# Patient Record
Sex: Female | Born: 1960
Health system: Southern US, Community
[De-identification: ages and names within clinical notes are randomized; demographics above are authoritative.]

## PROBLEM LIST (undated history)

## (undated) DIAGNOSIS — E669 Obesity, unspecified: Secondary | ICD-10-CM

## (undated) DIAGNOSIS — J329 Chronic sinusitis, unspecified: Secondary | ICD-10-CM

## (undated) DIAGNOSIS — K219 Gastro-esophageal reflux disease without esophagitis: Secondary | ICD-10-CM

## (undated) DIAGNOSIS — I25810 Atherosclerosis of coronary artery bypass graft(s) without angina pectoris: Secondary | ICD-10-CM

## (undated) DIAGNOSIS — H269 Unspecified cataract: Secondary | ICD-10-CM

## (undated) DIAGNOSIS — H409 Unspecified glaucoma: Secondary | ICD-10-CM

## (undated) DIAGNOSIS — T7840XA Allergy, unspecified, initial encounter: Secondary | ICD-10-CM

## (undated) DIAGNOSIS — Z9289 Personal history of other medical treatment: Secondary | ICD-10-CM

## (undated) DIAGNOSIS — I10 Essential (primary) hypertension: Secondary | ICD-10-CM

## (undated) DIAGNOSIS — J45909 Unspecified asthma, uncomplicated: Secondary | ICD-10-CM

## (undated) DIAGNOSIS — I480 Paroxysmal atrial fibrillation: Secondary | ICD-10-CM

## (undated) DIAGNOSIS — F419 Anxiety disorder, unspecified: Secondary | ICD-10-CM

## (undated) HISTORY — DX: Anxiety disorder, unspecified: F41.9

## (undated) HISTORY — DX: Obesity, unspecified: E66.9

## (undated) HISTORY — DX: Atherosclerosis of coronary artery bypass graft(s) without angina pectoris: I25.810

## (undated) HISTORY — DX: Unspecified glaucoma: H40.9

## (undated) HISTORY — DX: Unspecified asthma, uncomplicated: J45.909

## (undated) HISTORY — DX: Essential (primary) hypertension: I10

## (undated) HISTORY — DX: Gastro-esophageal reflux disease without esophagitis: K21.9

## (undated) HISTORY — PX: COLONOSCOPY: SHX174

## (undated) HISTORY — DX: Unspecified cataract: H26.9

## (undated) HISTORY — PX: OTHER SURGICAL HISTORY: SHX169

## (undated) HISTORY — DX: Allergy, unspecified, initial encounter: T78.40XA

## (undated) HISTORY — DX: Chronic sinusitis, unspecified: J32.9

---

## 1987-11-09 HISTORY — PX: OTHER SURGICAL HISTORY: SHX169

## 1999-04-23 ENCOUNTER — Other Ambulatory Visit: Admission: RE | Admit: 1999-04-23 | Discharge: 1999-04-23 | Payer: Self-pay | Admitting: Obstetrics and Gynecology

## 1999-11-09 HISTORY — PX: CERVICAL DISCECTOMY: SHX98

## 2000-09-06 ENCOUNTER — Other Ambulatory Visit: Admission: RE | Admit: 2000-09-06 | Discharge: 2000-09-06 | Payer: Self-pay | Admitting: Obstetrics and Gynecology

## 2000-10-03 ENCOUNTER — Ambulatory Visit (HOSPITAL_COMMUNITY): Admission: RE | Admit: 2000-10-03 | Discharge: 2000-10-04 | Payer: Self-pay | Admitting: Neurosurgery

## 2000-10-03 ENCOUNTER — Encounter: Payer: Self-pay | Admitting: Neurosurgery

## 2000-11-29 ENCOUNTER — Ambulatory Visit (HOSPITAL_COMMUNITY): Admission: RE | Admit: 2000-11-29 | Discharge: 2000-11-29 | Payer: Self-pay | Admitting: Neurosurgery

## 2000-11-29 ENCOUNTER — Encounter: Payer: Self-pay | Admitting: Neurosurgery

## 2001-08-23 ENCOUNTER — Encounter: Payer: Self-pay | Admitting: Neurosurgery

## 2001-08-23 ENCOUNTER — Ambulatory Visit (HOSPITAL_COMMUNITY): Admission: RE | Admit: 2001-08-23 | Discharge: 2001-08-23 | Payer: Self-pay | Admitting: Neurosurgery

## 2005-05-08 ENCOUNTER — Encounter: Payer: Self-pay | Admitting: Family Medicine

## 2005-05-08 LAB — CONVERTED CEMR LAB: Pap Smear: NORMAL

## 2005-09-21 ENCOUNTER — Ambulatory Visit: Payer: Self-pay | Admitting: Family Medicine

## 2005-10-13 ENCOUNTER — Ambulatory Visit: Payer: Self-pay | Admitting: Family Medicine

## 2005-10-22 ENCOUNTER — Ambulatory Visit: Payer: Self-pay | Admitting: Family Medicine

## 2005-11-23 ENCOUNTER — Ambulatory Visit: Payer: Self-pay | Admitting: Family Medicine

## 2005-12-01 ENCOUNTER — Ambulatory Visit: Payer: Self-pay | Admitting: Gastroenterology

## 2005-12-15 ENCOUNTER — Ambulatory Visit: Payer: Self-pay | Admitting: Family Medicine

## 2006-01-03 ENCOUNTER — Ambulatory Visit: Payer: Self-pay | Admitting: Gastroenterology

## 2006-01-04 ENCOUNTER — Ambulatory Visit: Payer: Self-pay | Admitting: Family Medicine

## 2006-01-04 LAB — HM COLONOSCOPY: HM Colonoscopy: NORMAL

## 2006-04-08 ENCOUNTER — Ambulatory Visit: Payer: Self-pay | Admitting: Family Medicine

## 2006-04-13 ENCOUNTER — Other Ambulatory Visit: Admission: RE | Admit: 2006-04-13 | Discharge: 2006-04-13 | Payer: Self-pay | Admitting: Family Medicine

## 2006-04-13 ENCOUNTER — Ambulatory Visit: Payer: Self-pay | Admitting: Family Medicine

## 2006-04-13 ENCOUNTER — Encounter: Payer: Self-pay | Admitting: Family Medicine

## 2006-08-30 ENCOUNTER — Ambulatory Visit: Payer: Self-pay | Admitting: Family Medicine

## 2006-09-27 ENCOUNTER — Ambulatory Visit: Payer: Self-pay | Admitting: Family Medicine

## 2007-01-18 ENCOUNTER — Ambulatory Visit: Payer: Self-pay | Admitting: Family Medicine

## 2007-02-07 HISTORY — PX: OTHER SURGICAL HISTORY: SHX169

## 2007-02-15 ENCOUNTER — Ambulatory Visit: Payer: Self-pay | Admitting: Family Medicine

## 2007-02-20 ENCOUNTER — Ambulatory Visit: Payer: Self-pay | Admitting: Family Medicine

## 2007-02-28 ENCOUNTER — Ambulatory Visit: Payer: Self-pay

## 2007-03-09 ENCOUNTER — Encounter: Payer: Self-pay | Admitting: Family Medicine

## 2007-03-09 DIAGNOSIS — F418 Other specified anxiety disorders: Secondary | ICD-10-CM | POA: Insufficient documentation

## 2007-03-09 DIAGNOSIS — I10 Essential (primary) hypertension: Secondary | ICD-10-CM | POA: Insufficient documentation

## 2007-03-09 DIAGNOSIS — J309 Allergic rhinitis, unspecified: Secondary | ICD-10-CM | POA: Insufficient documentation

## 2007-03-21 ENCOUNTER — Ambulatory Visit: Payer: Self-pay | Admitting: Family Medicine

## 2007-04-20 ENCOUNTER — Encounter: Payer: Self-pay | Admitting: Family Medicine

## 2007-04-24 ENCOUNTER — Ambulatory Visit: Payer: Self-pay | Admitting: Family Medicine

## 2007-04-27 ENCOUNTER — Telehealth: Payer: Self-pay | Admitting: Family Medicine

## 2007-05-18 ENCOUNTER — Telehealth (INDEPENDENT_AMBULATORY_CARE_PROVIDER_SITE_OTHER): Payer: Self-pay | Admitting: *Deleted

## 2007-05-19 ENCOUNTER — Ambulatory Visit: Payer: Self-pay | Admitting: Family Medicine

## 2007-05-19 LAB — CONVERTED CEMR LAB
Bilirubin Urine: NEGATIVE
Blood in Urine, dipstick: NEGATIVE
Glucose, Urine, Semiquant: NEGATIVE
Ketones, urine, test strip: NEGATIVE
Nitrite: NEGATIVE
Specific Gravity, Urine: <1.005
Urobilinogen, UA: 0.2
pH: 7.5

## 2007-07-18 ENCOUNTER — Ambulatory Visit: Payer: Self-pay | Admitting: Family Medicine

## 2007-07-18 LAB — CONVERTED CEMR LAB
ALT: 22 units/L (ref 0–35)
AST: 26 units/L (ref 0–37)
Albumin: 3.7 g/dL (ref 3.5–5.2)
Alkaline Phosphatase: 46 units/L (ref 39–117)
BUN: 12 mg/dL (ref 6–23)
Bilirubin, Direct: 0.1 mg/dL (ref 0.0–0.3)
CO2: 28 meq/L (ref 19–32)
Calcium: 9.2 mg/dL (ref 8.4–10.5)
Chloride: 105 meq/L (ref 96–112)
Cholesterol: 194 mg/dL (ref 0–200)
Creatinine, Ser: 0.7 mg/dL (ref 0.4–1.2)
GFR calc Af Amer: 116 mL/min
GFR calc non Af Amer: 96 mL/min
Glucose, Bld: 96 mg/dL (ref 70–99)
HDL: 77 mg/dL (ref >39.0)
LDL Cholesterol: 109 mg/dL — ABNORMAL HIGH (ref 0–99)
Potassium: 4.2 meq/L (ref 3.5–5.1)
Sodium: 137 meq/L (ref 135–145)
TSH: 2.03 microintl units/mL (ref 0.35–5.50)
Total Bilirubin: 0.9 mg/dL (ref 0.3–1.2)
Total CHOL/HDL Ratio: 2.5
Total Protein: 6.6 g/dL (ref 6.0–8.3)
Triglycerides: 39 mg/dL (ref 0–149)
VLDL: 8 mg/dL (ref 0–40)

## 2007-07-24 ENCOUNTER — Other Ambulatory Visit: Admission: RE | Admit: 2007-07-24 | Discharge: 2007-07-24 | Payer: Self-pay | Admitting: Family Medicine

## 2007-07-24 ENCOUNTER — Encounter: Payer: Self-pay | Admitting: Family Medicine

## 2007-07-24 ENCOUNTER — Ambulatory Visit: Payer: Self-pay | Admitting: Family Medicine

## 2007-07-27 ENCOUNTER — Encounter (INDEPENDENT_AMBULATORY_CARE_PROVIDER_SITE_OTHER): Payer: Self-pay | Admitting: *Deleted

## 2007-08-01 ENCOUNTER — Encounter: Payer: Self-pay | Admitting: Family Medicine

## 2007-09-04 ENCOUNTER — Ambulatory Visit: Payer: Self-pay | Admitting: Family Medicine

## 2007-09-05 ENCOUNTER — Encounter: Payer: Self-pay | Admitting: Family Medicine

## 2007-09-07 ENCOUNTER — Encounter (INDEPENDENT_AMBULATORY_CARE_PROVIDER_SITE_OTHER): Payer: Self-pay | Admitting: *Deleted

## 2007-09-29 ENCOUNTER — Telehealth (INDEPENDENT_AMBULATORY_CARE_PROVIDER_SITE_OTHER): Payer: Self-pay | Admitting: Internal Medicine

## 2007-10-02 ENCOUNTER — Ambulatory Visit: Payer: Self-pay | Admitting: Family Medicine

## 2007-11-09 HISTORY — PX: BUNIONECTOMY: SHX129

## 2007-11-29 ENCOUNTER — Ambulatory Visit: Payer: Self-pay | Admitting: Family Medicine

## 2007-11-29 LAB — CONVERTED CEMR LAB
BUN: 8 mg/dL (ref 6–23)
CO2: 26 meq/L (ref 19–32)
Calcium: 9.9 mg/dL (ref 8.4–10.5)
Chloride: 104 meq/L (ref 96–112)
Creatinine, Ser: 0.8 mg/dL (ref 0.4–1.2)
GFR calc Af Amer: 99 mL/min
GFR calc non Af Amer: 82 mL/min
Glucose, Bld: 102 mg/dL — ABNORMAL HIGH (ref 70–99)
Potassium: 3.9 meq/L (ref 3.5–5.1)
Sodium: 138 meq/L (ref 135–145)

## 2007-12-04 ENCOUNTER — Ambulatory Visit: Payer: Self-pay | Admitting: Family Medicine

## 2008-01-11 ENCOUNTER — Ambulatory Visit: Payer: Self-pay | Admitting: Family Medicine

## 2008-01-12 ENCOUNTER — Encounter: Payer: Self-pay | Admitting: Family Medicine

## 2008-02-13 ENCOUNTER — Telehealth: Payer: Self-pay | Admitting: Family Medicine

## 2008-03-04 ENCOUNTER — Ambulatory Visit: Payer: Self-pay | Admitting: Family Medicine

## 2008-03-27 ENCOUNTER — Encounter: Payer: Self-pay | Admitting: Family Medicine

## 2008-03-27 HISTORY — PX: ENDOMETRIAL BIOPSY: SHX622

## 2008-03-28 ENCOUNTER — Encounter: Payer: Self-pay | Admitting: Family Medicine

## 2008-04-09 ENCOUNTER — Encounter: Payer: Self-pay | Admitting: Family Medicine

## 2008-04-09 HISTORY — PX: US TRANSVAGINAL PELVIC MODIFIED: HXRAD721

## 2008-04-11 ENCOUNTER — Encounter: Payer: Self-pay | Admitting: Family Medicine

## 2008-08-01 ENCOUNTER — Ambulatory Visit: Payer: Self-pay | Admitting: Family Medicine

## 2008-08-01 LAB — CONVERTED CEMR LAB
ALT: 26 units/L (ref 0–35)
Albumin: 4.3 g/dL (ref 3.5–5.2)
BUN: 11 mg/dL (ref 6–23)
Basophils Relative: 0.3 % (ref 0.0–3.0)
CO2: 27 meq/L (ref 19–32)
Calcium: 9.6 mg/dL (ref 8.4–10.5)
Creatinine, Ser: 0.8 mg/dL (ref 0.4–1.2)
Eosinophils Relative: 4.1 % (ref 0.0–5.0)
GFR calc Af Amer: 99 mL/min
Glucose, Bld: 90 mg/dL (ref 70–99)
HCT: 39 % (ref 36.0–46.0)
Hemoglobin: 13.4 g/dL (ref 12.0–15.0)
Monocytes Absolute: 0.4 10*3/uL (ref 0.1–1.0)
Monocytes Relative: 9.5 % (ref 3.0–12.0)
Neutro Abs: 2.7 10*3/uL (ref 1.4–7.7)
RBC: 3.98 M/uL (ref 3.87–5.11)
RDW: 11.8 % (ref 11.5–14.6)
Total CHOL/HDL Ratio: 2.3
Total Protein: 7 g/dL (ref 6.0–8.3)
Triglycerides: 35 mg/dL (ref 0–149)
WBC: 4.3 10*3/uL — ABNORMAL LOW (ref 4.5–10.5)

## 2008-08-07 ENCOUNTER — Encounter: Payer: Self-pay | Admitting: Family Medicine

## 2008-08-07 ENCOUNTER — Other Ambulatory Visit: Admission: RE | Admit: 2008-08-07 | Discharge: 2008-08-07 | Payer: Self-pay | Admitting: Family Medicine

## 2008-08-07 ENCOUNTER — Ambulatory Visit: Payer: Self-pay | Admitting: Family Medicine

## 2008-08-12 ENCOUNTER — Encounter (INDEPENDENT_AMBULATORY_CARE_PROVIDER_SITE_OTHER): Payer: Self-pay | Admitting: *Deleted

## 2008-08-27 ENCOUNTER — Encounter: Payer: Self-pay | Admitting: Family Medicine

## 2008-10-28 ENCOUNTER — Ambulatory Visit: Payer: Self-pay | Admitting: Family Medicine

## 2008-10-28 LAB — CONVERTED CEMR LAB
Calcium: 9.2 mg/dL (ref 8.4–10.5)
GFR calc Af Amer: 115 mL/min
GFR calc non Af Amer: 95 mL/min
Potassium: 3.9 meq/L (ref 3.5–5.1)
Sodium: 137 meq/L (ref 135–145)

## 2008-11-04 ENCOUNTER — Ambulatory Visit: Payer: Self-pay | Admitting: Family Medicine

## 2009-02-03 ENCOUNTER — Ambulatory Visit: Payer: Self-pay | Admitting: Family Medicine

## 2009-02-06 ENCOUNTER — Telehealth: Payer: Self-pay | Admitting: Family Medicine

## 2009-03-17 ENCOUNTER — Telehealth: Payer: Self-pay | Admitting: Family Medicine

## 2009-03-18 ENCOUNTER — Telehealth: Payer: Self-pay | Admitting: Family Medicine

## 2009-03-19 ENCOUNTER — Telehealth: Payer: Self-pay | Admitting: Family Medicine

## 2009-04-11 ENCOUNTER — Telehealth: Payer: Self-pay | Admitting: Family Medicine

## 2009-05-06 ENCOUNTER — Ambulatory Visit: Payer: Self-pay | Admitting: Family Medicine

## 2009-06-17 ENCOUNTER — Telehealth: Payer: Self-pay | Admitting: Family Medicine

## 2009-07-02 ENCOUNTER — Ambulatory Visit: Payer: Self-pay | Admitting: Family Medicine

## 2009-07-02 LAB — CONVERTED CEMR LAB
BUN: 8 mg/dL (ref 6–23)
Chloride: 100 meq/L (ref 96–112)
Creatinine, Ser: 0.8 mg/dL (ref 0.4–1.2)
GFR calc non Af Amer: 81.1 mL/min (ref >60)
Potassium: 4.5 meq/L (ref 3.5–5.1)

## 2009-07-07 ENCOUNTER — Ambulatory Visit: Payer: Self-pay | Admitting: Family Medicine

## 2009-08-11 ENCOUNTER — Ambulatory Visit: Payer: Self-pay | Admitting: Family Medicine

## 2009-08-11 LAB — CONVERTED CEMR LAB
AST: 32 units/L (ref 0–37)
Albumin: 4.6 g/dL (ref 3.5–5.2)
Alkaline Phosphatase: 55 units/L (ref 39–117)
Bilirubin, Direct: 0 mg/dL (ref 0.0–0.3)
CO2: 29 meq/L (ref 19–32)
Calcium: 9.6 mg/dL (ref 8.4–10.5)
GFR calc non Af Amer: 81.06 mL/min (ref >60)
Glucose, Bld: 84 mg/dL (ref 70–99)
HDL: 80.7 mg/dL (ref >39.00)
Potassium: 4.3 meq/L (ref 3.5–5.1)
Sodium: 136 meq/L (ref 135–145)
TSH: 1.72 microintl units/mL (ref 0.35–5.50)
Total CHOL/HDL Ratio: 2
VLDL: 8.6 mg/dL (ref 0.0–40.0)

## 2009-08-14 ENCOUNTER — Encounter: Payer: Self-pay | Admitting: Family Medicine

## 2009-08-14 ENCOUNTER — Ambulatory Visit: Payer: Self-pay | Admitting: Family Medicine

## 2009-08-14 ENCOUNTER — Other Ambulatory Visit: Admission: RE | Admit: 2009-08-14 | Discharge: 2009-08-14 | Payer: Self-pay | Admitting: Family Medicine

## 2009-08-14 DIAGNOSIS — K219 Gastro-esophageal reflux disease without esophagitis: Secondary | ICD-10-CM | POA: Insufficient documentation

## 2009-08-14 LAB — CONVERTED CEMR LAB: Pap Smear: NORMAL

## 2009-08-19 ENCOUNTER — Encounter (INDEPENDENT_AMBULATORY_CARE_PROVIDER_SITE_OTHER): Payer: Self-pay | Admitting: *Deleted

## 2009-08-25 ENCOUNTER — Telehealth: Payer: Self-pay | Admitting: Family Medicine

## 2009-09-01 ENCOUNTER — Encounter: Payer: Self-pay | Admitting: Family Medicine

## 2009-11-08 DIAGNOSIS — J329 Chronic sinusitis, unspecified: Secondary | ICD-10-CM

## 2009-11-08 HISTORY — DX: Chronic sinusitis, unspecified: J32.9

## 2009-11-26 ENCOUNTER — Ambulatory Visit: Payer: Self-pay | Admitting: Family Medicine

## 2009-11-28 ENCOUNTER — Telehealth: Payer: Self-pay | Admitting: Family Medicine

## 2009-12-10 ENCOUNTER — Telehealth: Payer: Self-pay | Admitting: Family Medicine

## 2009-12-24 ENCOUNTER — Ambulatory Visit: Payer: Self-pay | Admitting: Family Medicine

## 2010-01-01 ENCOUNTER — Telehealth: Payer: Self-pay | Admitting: Family Medicine

## 2010-01-02 ENCOUNTER — Encounter: Payer: Self-pay | Admitting: Family Medicine

## 2010-03-04 ENCOUNTER — Ambulatory Visit: Payer: Self-pay | Admitting: Family Medicine

## 2010-03-12 ENCOUNTER — Telehealth: Payer: Self-pay | Admitting: Family Medicine

## 2010-03-12 ENCOUNTER — Ambulatory Visit: Payer: Self-pay | Admitting: Family Medicine

## 2010-03-17 ENCOUNTER — Telehealth: Payer: Self-pay | Admitting: Family Medicine

## 2010-03-23 ENCOUNTER — Encounter: Payer: Self-pay | Admitting: Family Medicine

## 2010-04-14 ENCOUNTER — Encounter: Payer: Self-pay | Admitting: Family Medicine

## 2010-05-05 ENCOUNTER — Telehealth: Payer: Self-pay | Admitting: Family Medicine

## 2010-05-13 ENCOUNTER — Encounter: Payer: Self-pay | Admitting: Family Medicine

## 2010-05-24 ENCOUNTER — Encounter: Payer: Self-pay | Admitting: Family Medicine

## 2010-06-04 ENCOUNTER — Ambulatory Visit: Payer: Self-pay | Admitting: Unknown Physician Specialty

## 2010-06-08 ENCOUNTER — Ambulatory Visit: Payer: Self-pay | Admitting: Unknown Physician Specialty

## 2010-06-10 ENCOUNTER — Encounter: Payer: Self-pay | Admitting: Family Medicine

## 2010-06-16 ENCOUNTER — Encounter (INDEPENDENT_AMBULATORY_CARE_PROVIDER_SITE_OTHER): Payer: Self-pay | Admitting: *Deleted

## 2010-08-21 ENCOUNTER — Ambulatory Visit: Payer: Self-pay | Admitting: Unknown Physician Specialty

## 2010-09-10 ENCOUNTER — Telehealth: Payer: Self-pay | Admitting: Family Medicine

## 2010-09-30 ENCOUNTER — Ambulatory Visit: Payer: Self-pay | Admitting: Cardiology

## 2010-09-30 ENCOUNTER — Encounter (INDEPENDENT_AMBULATORY_CARE_PROVIDER_SITE_OTHER): Payer: Self-pay | Admitting: Emergency Medicine

## 2010-09-30 ENCOUNTER — Observation Stay (HOSPITAL_COMMUNITY): Admission: EM | Admit: 2010-09-30 | Discharge: 2010-09-30 | Payer: Self-pay | Admitting: Emergency Medicine

## 2010-10-07 ENCOUNTER — Ambulatory Visit: Payer: Self-pay | Admitting: Family Medicine

## 2010-10-07 DIAGNOSIS — R05 Cough: Secondary | ICD-10-CM

## 2010-10-07 DIAGNOSIS — R059 Cough, unspecified: Secondary | ICD-10-CM | POA: Insufficient documentation

## 2010-10-13 ENCOUNTER — Encounter: Payer: Self-pay | Admitting: Family Medicine

## 2010-10-15 ENCOUNTER — Ambulatory Visit: Payer: Self-pay | Admitting: Family Medicine

## 2010-10-16 DIAGNOSIS — E7800 Pure hypercholesterolemia, unspecified: Secondary | ICD-10-CM | POA: Insufficient documentation

## 2010-10-16 LAB — CONVERTED CEMR LAB
Cholesterol: 205 mg/dL — ABNORMAL HIGH (ref 0–200)
Direct LDL: 81 mg/dL

## 2010-10-20 ENCOUNTER — Ambulatory Visit: Payer: Self-pay | Admitting: Family Medicine

## 2010-10-20 LAB — CONVERTED CEMR LAB

## 2010-10-20 LAB — HM PAP SMEAR

## 2010-11-19 ENCOUNTER — Encounter: Payer: Self-pay | Admitting: Family Medicine

## 2010-11-19 ENCOUNTER — Ambulatory Visit: Payer: Self-pay | Admitting: Family Medicine

## 2010-11-23 ENCOUNTER — Ambulatory Visit
Admission: RE | Admit: 2010-11-23 | Discharge: 2010-11-23 | Payer: Self-pay | Source: Home / Self Care | Attending: Family Medicine | Admitting: Family Medicine

## 2010-12-10 NOTE — Progress Notes (Signed)
Summary: need cough med  Phone Note Call from Patient Call back at [phone]   Caller: Patient Call For: Dr Milinda Antis Summary of Call: Pt saw Dr. Hetty Ely on 11/26/09 and had productive cough and Dr. Hetty Ely told pt to get mucus relief expectorant. Pt states that is not helping the cough and would like cough med called [facility]. [phone]. Please advise.  Initial call taken by: Lewanda Rife LPN,  November 28, 2009 2:44 PM  Follow-up for Phone Call        if fever or worse cough- f/u px written on EMR for call in - codiene cough syrup -- use caution for sedation Follow-up by: Judith Part MD,  November 28, 2009 4:36 PM  Additional Follow-up for Phone Call Additional follow up Details #1::        Med called to [facility], advised pt. Additional Follow-up by: Lowella Petties CMA,  November 28, 2009 4:44 PM    New/Updated Medications: GUAIFENESIN-CODEINE 100-10 MG/5ML SYRP (GUAIFENESIN-CODEINE) 1-2 tsp by mouth up to every 4-6 hours as needed severe cough Prescriptions: GUAIFENESIN-CODEINE 100-10 MG/5ML SYRP (GUAIFENESIN-CODEINE) 1-2 tsp by mouth up to every 4-6 hours as needed severe cough  #120cc x 0   Entered and Authorized by:   Judith Part MD   Signed by:   Lowella Petties CMA on 11/28/2009   Method used:   [facility] ...       [facility]  [id number]       [address]       [place], [place]  [address]       Ph: [phone]       Fax: [phone]   RxID:   [id number]

## 2010-12-10 NOTE — Letter (Signed)
Summary: [facility]  [facility]   Imported By: Lanelle Bal 05/29/2010 09:39:13  _____________________________________________________________________  External Attachment:    Type:   Image     Comment:   External Document

## 2010-12-10 NOTE — Letter (Signed)
Summary: [facility]  [facility]   Imported By: Maryln Gottron 06/08/2010 15:38:20  _____________________________________________________________________  External Attachment:    Type:   Image     Comment:   External Document  Appended Document: [facility]     Clinical Lists Changes

## 2010-12-10 NOTE — Miscellaneous (Signed)
   Clinical Lists Changes  Observations: Added new observation of PAST MED HX: Allergic rhinitis Anxiety Hypertension Recurrent sinusitis per Dr. Jenne Campus with [facility] 2011 (05/24/2010 17:01)       Past History:  Past Medical History: Allergic rhinitis Anxiety Hypertension Recurrent sinusitis per Dr. Jenne Campus with  ENT 2011

## 2010-12-10 NOTE — Assessment & Plan Note (Signed)
Summary: 51 MIN DR Hetty Ely PT TO ESTABLISH HOSPITAL FOLLOW UP DISCHAR...   Vital Signs:  Patient profile:   50 year old female Height:      65 inches Weight:      175.8 pounds BMI:     29.36 Temp:     98.0 degrees F oral Pulse rate:   76 / minute Pulse rhythm:   regular BP sitting:   130 / 80  (left arm) Cuff size:   regular  Vitals Entered By: Benny Lennert CMA Duncan Dull) (October 07, 2010 10:11 AM)  History of Present Illness: Chief complaint transfer from [facility] and follow hospital in er   ER visit for acute chest pain, shortness of breath and BP elevation (240/120) on 11/23.  CE x 3 and CMET, cbc, CXR all negative.  Early family hisotry of MI in father in 58s.  She is not a  smoker.   Cholesterol ..last checked last year.    Exercise stress ECHO performed...nml EF and no sign of ischemia.Marland Kitchenadequate study on 11/23.  Episodes of BP shooting up every few weeks.. intermittantly in last year. This time it was different..had chest pressure.   Chronic cough for a year... seeing ENT.Marland Kitchenrecent surgery for sinus surgery (found fungal infection) 7 weeks ago... cough resolved for 6 weeks but returned in last 2 weeks.   Per pt has not had laryngoscopy.  Has had some wheezingm and SOB at night, post nasl drip, throat itchy..feels more short of breath with lying back at night. Snores at night, no apnea spells reported by husband. Per pt saw [facility]  [facility]UL[facility].Marland Kitchen PFTs nml per pt (none on record) and CXR nml.  Having heartburn at night  several times a week.. uses TUMs and omeprazole 40 mg daily...  occ emesis. Zegrid helped more in the past. Nexium not helpful.   She reports anxiety well controlled .. using 1/2 tab by mouth daily of Zoloft  On Hyzaar and metoprolol for HTN. She feels like cough started when she changed to generic losartan.  HAs tried Micardia and ACEi in past. When Bp high she feels flushed, palpitations.   Preventive Screening-Counseling &  Management  Caffeine-Diet-Exercise     Does Patient Exercise: no      Drug Use:  no.    Problems Prior to Update: 1)  Pure Hypercholesterolemia  (ICD-272.0) 2)  Gerd  (ICD-530.81) 3)  Other Screening Mammogram  (ICD-V76.12) 4)  Other Abscess of Vulva  (ICD-616.4) 5)  Examination, Routine Medical  (ICD-V70.0) 6)  Hypertension  (ICD-401.9) 7)  Anxiety  (ICD-300.00) 8)  Allergic Rhinitis  (ICD-477.9)  Current Medications (verified): 1)  Zoloft 50 Mg Tabs (Sertraline Hcl) .... Take 1/2   By Mouth Each Am 2)  Calcium 500 Mg Tabs (Calcium) .Marland Kitchen.. 1 Once Daily Per Patient 3)  Claritin 10 Mg  Tabs (Loratadine) .Marland Kitchen.. 1 By Mouth Daily 4)  Hyzaar 100-12.5 Mg Tabs (Losartan Potassium-Hctz) .... One Tab By Mouth Every Am 5)  B-12 250 Mcg  Tabs (Cyanocobalamin) .Marland Kitchen.. 1 Tablet Daily By Mouth 6)  Metoprolol Succinate 100 Mg Xr24h-Tab (Metoprolol Succinate) .... Onetab By Mouth At Night 7)  Pantoprazole Sodium 40 Mg Tbec (Pantoprazole Sodium) .... Take 1 Tablet By Mouth Once A Day 8)  Amlodipine Besylate 10 Mg Tabs (Amlodipine Besylate) .... 1/2 Tab By Mouth Daily X 3 Days Then Increase To 1 Tab By Mouth Daily  Allergies (verified): No Known Drug Allergies  Past History:  Past medical, surgical, family and social histories (including risk factors)  reviewed, and no changes noted (except as noted below).  Past Medical History: Reviewed history from 05/24/2010 and no changes required. Allergic rhinitis Anxiety Hypertension Recurrent sinusitis per Dr. Jenne Campus with [facility] 2011  Past Surgical History: Reviewed history from 08/07/2008 and no changes required. C/S due incr BP  1985 NSVD  VBAC  1989 C5/6 Discectomy  2001 Colonoscopy WNL 01/03/2006    5 years Renal artery U/S [place]ine arteries but no stenosis  04/2202008 R foot surgery Bunion , 2 hammertoes and tendon replacement (Dr Charlsie Merles) 11/2007 Endometrial Bx B9 Endometrium w/ Breakdown changes.(Dr Kinscius)  03/27/2008 Pelvic U/S Nml  04/09/2008  Family History: Reviewed history from 08/14/2009 and no changes required. Father dec 68  CHF HTN Lung Failure(Vent x 4 wks) Colon Ca Ulcerated Pneumonia Mother A 26  Colon CA Partial Colectomy Melanomax2 Brother A 45 HTN Polyps Sister A  52 COPD Polyps Sister A 45 Htn Polyps Sister A 42 Polyps  Social History: Reviewed history from 08/07/2008 and no changes required. Occupation:Branch Occupational hygienist Married  2 children  Never smoker. 2 glasses of wine every day.  Drug use-no Regular exercise-no..used to but unable to do given chronic cough. Does Patient Exercise:  no  Review of Systems General:  Denies fever. CV:  Denies chest pain or discomfort. Resp:  Denies coughing up blood and shortness of breath. GI:  Denies abdominal pain and bloody stools. GU:  Denies dysuria.  Physical Exam  General:  Well-developed,well-nourished,in no acute distress; alert,appropriate and cooperative throughout examination Eyes:  No corneal or conjunctival inflammation noted. EOMI. Perrla. Funduscopic exam benign, without hemorrhages, exudates or papilledema. Vision grossly normal. Ears:  External ear exam shows no significant lesions or deformities.  Otoscopic examination reveals clear canals, tympanic membranes are intact bilaterally without bulging, retraction, inflammation or discharge. Hearing is grossly normal bilaterally. Nose:  External nasal examination shows no deformity or inflammation. Nasal mucosa are pink and moist without lesions or exudates. Mouth:  Oral mucosa and oropharynx without lesions or exudates.  Teeth in good repair. Neck:  no carotid bruit or thyromegaly no cervical or supraclavicular lymphadenopathy  Lungs:  Normal respiratory effort, chest expands symmetrically. Lungs are clear to auscultation, no crackles or wheezes. Heart:  Normal rate and regular rhythm. S1 and S2 normal without gallop, murmur, click, rub or other extra sounds. Abdomen:  Bowel sounds  positive,abdomen soft and non-tender without masses, organomegaly or hernias noted. Pulses:  R and L posterior tibial pulses are full and equal bilaterally  Extremities:  no edmea  Skin:  Intact without suspicious lesions or rashes Psych:  Cognition and judgment appear intact. Alert and cooperative with normal attention span and concentration. No apparent delusions, illusions, hallucinations   Impression & Recommendations:  Problem # 1:  COUGH, CHRONIC (ICD-786.2)  Stop omeprazole .. change to pantoprazole 40 mg daily.   Not resolved with sinus surgery.  [facility] at [facility] .. reviewed and nml.   Less ikely due to allergies.  Problem # 2:  HYPERTENSION (ICD-401.9)  Stop metoprolol.. change to amlodipine 5 mg daily x 3 days then increase to 1 mg daily Follow Bps at home daily.. bring in measurements to next appt.  Her updated medication list for this problem includes:    Hyzaar 100-12.5 Mg Tabs (Losartan potassium-hctz) ..... One tab by mouth every am    Metoprolol Succinate 100 Mg Xr24h-tab (Metoprolol succinate) ..... Onetab by mouth at night    Amlodipine Besylate 10 Mg Tabs (Amlodipine besylate) .Marland Kitchen... 1/2 tab by  mouth daily x 3 days then increase to 1 tab by mouth daily  Orders: T- * Misc. Laboratory test [phone])  Problem # 3:  PURE HYPERCHOLESTEROLEMIA (ICD-272.0) Due for reeval.   Problem # 4:  GERD (ICD-530.81) Poor control.. likely cause of #1 as well. Change to pantoprazole.  The following medications were removed from the medication list:    Zegerid Otc 20-1100 Mg Caps (Omeprazole-sodium bicarbonate) .Marland Kitchen... Take one by mouth two times a day Her updated medication list for this problem includes:    Pantoprazole Sodium 40 Mg Tbec (Pantoprazole sodium) .Marland Kitchen... Take 1 tablet by mouth once a day  Problem # 5:  ANXIETY (ICD-300.00) Well controlled. Continue current medication. Refilled.  Her updated medication list for this problem includes:    Zoloft 50 Mg Tabs (Sertraline  hcl) .Marland Kitchen... Take 1/2   by mouth each am  Complete Medication List: 1)  Zoloft 50 Mg Tabs (Sertraline hcl) .... Take 1/2   by mouth each am 2)  Calcium 500 Mg Tabs (Calcium) .Marland Kitchen.. 1 once daily per patient 3)  Claritin 10 Mg Tabs (Loratadine) .Marland Kitchen.. 1 by mouth daily 4)  Hyzaar 100-12.5 Mg Tabs (Losartan potassium-hctz) .... One tab by mouth every am 5)  B-12 250 Mcg Tabs (Cyanocobalamin) .Marland Kitchen.. 1 tablet daily by mouth 6)  Metoprolol Succinate 100 Mg Xr24h-tab (Metoprolol succinate) .... Onetab by mouth at night 7)  Pantoprazole Sodium 40 Mg Tbec (Pantoprazole sodium) .... Take 1 tablet by mouth once a day 8)  Amlodipine Besylate 10 Mg Tabs (Amlodipine besylate) .... 1/2 tab by mouth daily x 3 days then increase to 1 tab by mouth daily  Patient Instructions: 1)  Lipids, TSH  in next few weeks fasting Dx 272.0, 401.1 2)   Schedule appt for CPX following... work in to any 30 min slot.  3)   Stop omeprazole .. change to pantoprazole 40 mg daily.  4)   Stop metoprolol.. change to amlodipine 5 mg daily x 3 days then increase to 1 mg daily 5)  Follow Bps at home daily.. bring in measurements to next appt.  Prescriptions: ZOLOFT 50 MG TABS (SERTRALINE HCL) Take 1/2   by mouth each am  #45 x 4   Entered by:   Benny Lennert CMA (AAMA)   Authorized by:   Kerby Nora MD   Signed by:   Benny Lennert CMA (AAMA) on 10/12/2010   Method used:   [facility]        [id number]       [address]       [place], [place]  [address]       Ph: [phone]       Fax: [phone]   RxID:   [id number] AMLODIPINE BESYLATE 10 MG TABS (AMLODIPINE BESYLATE) 1/2 tab by mouth daily x 3 days then increase to 1 tab by mouth daily  #30 x 11   Entered and Authorized by:   Kerby Nora MD   Signed by:   Kerby Nora MD on 10/07/2010   Method used:   [facility]        [id number]       [address]       [place], [place]  [address]       Ph:  [phone]       Fax: [phone]   RxID:   [id number] PANTOPRAZOLE SODIUM 40 MG TBEC (PANTOPRAZOLE SODIUM) Take 1 tablet by mouth once a day  #  30 x 11   Entered and Authorized by:   Kerby Nora MD   Signed by:   Kerby Nora MD on 10/07/2010   Method used:   [facility]ically [facility]        [id number]       [address]       [place], [place]  [address]       Ph: [phone]       Fax: [phone]   RxID:   [id number]    Orders Added: 1)  T- * Misc. Laboratory test [99999] 2)  Est. Patient Level IV [24401]    Current Allergies (reviewed today): No known allergies   Flu Vaccine Next Due:  Refused Flex Sig Next Due:  Not Indicated Colonoscopy Result Date:  11/08/2005 Colonoscopy Result:  normal Colonoscopy Next Due:  5 yr Hemoccult Next Due:  Not Indicated

## 2010-12-10 NOTE — Progress Notes (Signed)
Summary: not any better  Phone Note Call from Patient Call back at Work Phone [phone]   Caller: Patient Call For: Shaune Leeks MD Summary of Call: Pt was seen last week for cough, congestion and was told to come back in one week if not better.  She is not- still coughing, to the point of vomiting.  Congestion is worse, has wheezing.  Taking zyrtec, using nasal spray.  Maybe some fever off and on- none today.  There are no appts available.  Do you want to work her in.  [facility]. Initial call taken by: Lowella Petties CMA,  Mar 12, 2010 9:40 AM  Follow-up for Phone Call        see her at 41. Follow-up by: Shaune Leeks MD,  Mar 12, 2010 10:08 AM  Additional Follow-up for Phone Call Additional follow up Details #1::        Appt made, pt advised. Additional Follow-up by: Lowella Petties CMA,  Mar 12, 2010 10:36 AM

## 2010-12-10 NOTE — Progress Notes (Signed)
Summary: refill request for cheratussin  Phone Note Refill Request Message from:  Fax from Pharmacy  Refills Requested: Medication #1:  CHERATUSSIN AC 100-10 MG/5ML SYRP one tsp by mouth at night as needed for cough.   Last Refilled: 04/21/2010 [facility] [address], [phone].  Initial call taken by: Lowella Petties CMA,  May 05, 2010 9:01 AM  Follow-up for Phone Call        [facility] Follow-up by: Lowella Petties CMA,  May 05, 2010 10:00 AM    Prescriptions: CHERATUSSIN AC 100-10 MG/5ML SYRP (GUAIFENESIN-CODEINE) one tsp by mouth at night as needed for cough  #8 oz x 0   Entered and Authorized by:   Shaune Leeks MD   Signed by:   Shaune Leeks MD on 05/05/2010   Method used:   [facility]ph[facility] ...       [facility]  [id number]       [address]       [place], [place]  [address]       Ph: [phone]       Fax: [phone]   RxID:   [id number]

## 2010-12-10 NOTE — Assessment & Plan Note (Signed)
Summary: COUGH AND CONGESTION/ lb   Vital Signs:  Patient profile:   50 year old female Weight:      173.50 pounds O2 Sat:      98 % on Room air Temp:     98.5 degrees F oral Pulse rate:   76 / minute Pulse rhythm:   regular BP sitting:   128 / 70  (left arm) Cuff size:   large  Vitals Entered By: Sydell Axon LPN (Mar 12, 1609 4:24 PM)  O2 Flow:  Room air CC: Head and chest congestion, non-productive cough   History of Present Illness: Pt seen 4/27 for congestion, mostly for allergy congestion. Her coughing is worse. Mon nite and last nite coughed for two straight hours and couldn't stop.  Her eyes have continued to itch while taking Patanol. She is also using Omnaris as directed with directions. She has not had fever or chills. With the coughing she gets red in the face and everything starts to hurt.  She has had trouble getting to sleep as well.  Allergies: No Known Drug Allergies  Physical Exam  General:  Well-developed,well-nourished,in no acute distress; alert,appropriate and cooperative throughout examination, minimally congested, mildly hoarse. Head:  Normocephalic and atraumatic without obvious abnormalities. No apparent alopecia or balding. Sinuses NT. Eyes:  conjunctiva inflamed in palpebral distrib. Ears:  External ear exam shows no significant lesions or deformities.  Otoscopic examination reveals clear canals, tympanic membranes are intact bilaterally without bulging, retraction, inflammation or discharge. Hearing is grossly normal bilaterally. Nose:  mild inflamm with clear mucous. Mouth:  Oral mucosa and oropharynx without lesions or exudates.  Teeth in good repair. Mild PND. Neck:  No deformities, masses, or tenderness noted. Lungs:  Normal respiratory effort, chest expands symmetrically. Lungs are clear to auscultation, no crackles or wheezes. Heart:  Normal rate and regular rhythm. S1 and S2 normal without gallop, murmur, click, rub or other extra  sounds.   Impression & Recommendations:  Problem # 1:  BRONCHITIS- ACUTE (ICD-466.0) Assessment New See instructions. Cont allergy trmt from last visit. The following medications were removed from the medication list:    Mucinex Dm Maximum Strength 60-1200 Mg Xr12h-tab (Dextromethorphan-guaifenesin) .Marland Kitchen... As needed Her updated medication list for this problem includes:    Zithromax Z-pak 250 Mg Tabs (Azithromycin) .Marland Kitchen... As dir    Tessalon 200 Mg Caps (Benzonatate) ..... One tab by mouth three times a day as needed for cough    Tussionex Pennkinetic Er 8-10 Mg/34ml Lqcr (Chlorpheniramine-hydrocodone) ..... One tsp by mouth at night as needed for cough  Complete Medication List: 1)  Zoloft 50 Mg Tabs (Sertraline hcl) .... Take 1/2   by mouth each am 2)  Calcium 500 Mg Tabs (Calcium) .Marland Kitchen.. 1 once daily per patient 3)  Claritin 10 Mg Tabs (Loratadine) .Marland Kitchen.. 1 by mouth daily 4)  Hyzaar 100-12.5 Mg Tabs (Losartan potassium-hctz) .... One tab by mouth every am 5)  [facility] B-6 200 Mg Tabs (Pyridoxine hcl) .Marland Kitchen.. 1 daily by mouth 6)  B-12 250 Mcg Tabs (Cyanocobalamin) .Marland Kitchen.. 1 tablet daily by mouth 7)  Metoprolol Succinate 100 Mg Xr24h-tab (Metoprolol succinate) .... Onetab by mouth at night 8)  Zegerid Otc 20-1100 Mg Caps (Omeprazole-sodium bicarbonate) .... Take one by mouth two times a day 9)  Patanol 0.1 % Soln (Olopatadine hcl) .... One drop in each eye daily. 10)  Nasonex 50 Mcg/act Susp (Mometasone furoate) .... 2 squirts each nostril two times a day 11)  Zithromax Z-pak 250 Mg  Tabs (Azithromycin) .... As dir 12)  Tessalon 200 Mg Caps (Benzonatate) .... One tab by mouth three times a day as needed for cough 13)  Tussionex Pennkinetic Er 8-10 Mg/32ml Lqcr (Chlorpheniramine-hydrocodone) .... One tsp by mouth at night as needed for cough  Patient Instructions: 1)  Use Zithromax. 2)  Take Guaifenesin by [facility] or [facility] and getting MUCOUS RELIEF EXPECTORANT (400mg ),  take 11/2 tabs by mouth AM and NOON. 3)  Drink lots of fluids anytime taking Guaifenesin.  4)  Tessalon three times a day. 5)  Tussionex at night. Prescriptions: Sandria Senter ER 8-10 MG/5ML LQCR (CHLORPHENIRAMINE-HYDROCODONE) one tsp by mouth at night as needed for cough  #8 oz x 0   Entered and Authorized by:   Shaune Leeks MD   Signed by:   Shaune Leeks MD on 03/12/2010   Method used:   Print then Give to Patient   RxID:   [id number] TESSALON 200 MG CAPS (BENZONATATE) one tab by mouth three times a day as needed for cough  #50 x 0   Entered and Authorized by:   Shaune Leeks MD   Signed by:   Shaune Leeks MD on 03/12/2010   Method used:   [facility]ronically to        [id number]       [address]       [place], [place]  [address]       Ph: [phone]       Fax: [phone]   RxID:   [id number] ZITHROMAX Z-PAK 250 MG TABS (AZITHROMYCIN) as dir  #1 pak x 0   Entered and Authorized by:   Shaune Leeks MD   Signed by:   Shaune Leeks MD on 03/12/2010   Method used:   Electronically to        [id number]       [address]       [place], [place]  [address]       Ph: [phone]       Fax: [phone]   RxID:   [id number]   Current Allergies (reviewed today): No known allergies

## 2010-12-10 NOTE — Consult Note (Signed)
Summary: Dr.Chapman McQueen,[facility],Note  Dr.Chapman McQueen,[facility],Note   Imported By: Beau Fanny 04/02/2010 16:04:58  _____________________________________________________________________  External Attachment:    Type:   Image     Comment:   External Document

## 2010-12-10 NOTE — Letter (Signed)
Summary: Nadara Eaton letter  [facility]  [address] [place], [place] [address]   Phone: [phone]  Fax: [phone]       06/16/2010 MRN: [id number]  [facility] [address] [place], [place]  [address]  Dear Ms. [facility],  [facility] [place], and [facility] [place] announce the retirement of Arta Silence, M.D., from full-time practice at the [facility] office effective May 07, 2010 and his plans of returning part-time.  It is important to Dr. Hetty Ely and to our practice that you understand that [facility] has seven physicians in our office for your health care needs.  We will continue to offer the same exceptional care that you have today.    Dr. Hetty Ely has spoken to many of you about his plans for retirement and returning part-time in the fall.   We will continue to work with you through the transition to schedule appointments for you in the office and meet the high standards that [facility] is committed to.   Again, it is with great pleasure that we share the news that Dr. Hetty Ely will return to [facility] in October of 2011 with a reduced schedule.    If you have any questions, or would like to request an appointment with one of our physicians, please call us at [phone] and press the option for Scheduling an appointment.  We take pleasure in providing you with excellent patient care and look forward to seeing you at your next office visit.  Our [facility] Physicians are:  Tillman Abide, M.D. Laurita Quint, M.D. Roxy Manns, M.D. Kerby Nora, M.D. Hannah Beat, M.D. Ruthe Mannan, M.D. We proudly welcomed Raechel Ache, M.D. and Eustaquio Boyden, M.D. to the practice in July/August 2011.  Sincerely,   [facility]

## 2010-12-10 NOTE — Assessment & Plan Note (Signed)
Summary: CPX /RBH   Vital Signs:  Patient profile:   50 year old female Height:      65 inches Weight:      175.50 pounds BMI:     29.31 Temp:     98.4 degrees F oral Pulse rate:   76 / minute Pulse rhythm:   regular BP sitting:   140 / 70  (left arm) Cuff size:   large  Vitals Entered By: Linde Gillis CMA Duncan Dull) (October 20, 2010 3:43 PM) CC: complete physicial, no pap     Last PAP Date 10/20/2010 Last PAP Result DVE , no pap.Marland Kitchen q2-3 years   History of Present Illness: The patient is here for annual wellness exam and preventative care.    She also has the following chronic health issues:   Chronic cough... trial 2 weeks ago started pantoprazole given below GERD symptoms.  Has noted in last week some productive mucus.    GERD:  Improved on pantoprazole.  HTN, fluctuating  control...  occ episodes of flushing and high BP. Recent urinary catecholamines neg. Brings in measurements 133/80- 181/88. On max dose amlodipine... off metoprolol... thought cough might be SE of BBlocker.  Switched to Hyzaar generic in last year.. she associates this with cough.      Problems Prior to Update: 1)  Pure Hypercholesterolemia  (ICD-272.0) 2)  Cough, Chronic  (ICD-786.2) 3)  Gerd  (ICD-530.81) 4)  Other Screening Mammogram  (ICD-V76.12) 5)  Examination, Routine Medical  (ICD-V70.0) 6)  Hypertension  (ICD-401.9) 7)  Anxiety  (ICD-300.00) 8)  Allergic Rhinitis  (ICD-477.9)  Current Medications (verified): 1)  Zoloft 50 Mg Tabs (Sertraline Hcl) .... Take 1/2   By Mouth Each Am 2)  Calcium 500 Mg Tabs (Calcium) .Marland Kitchen.. 1 Once Daily Per Patient 3)  Zyrtec Allergy 10 Mg Tabs (Cetirizine Hcl) .... Take One Tablet By Mouth Daily 4)  Hyzaar 100-12.5 Mg Tabs (Losartan Potassium-Hctz) .... One Tab By Mouth Every Am 5)  B-12 250 Mcg  Tabs (Cyanocobalamin) .Marland Kitchen.. 1 Tablet Daily By Mouth 6)  Metoprolol Succinate 100 Mg Xr24h-Tab (Metoprolol Succinate) .... Onetab By Mouth At Night 7)   Pantoprazole Sodium 40 Mg Tbec (Pantoprazole Sodium) .... Take 1 Tablet By Mouth Once A Day 8)  Amlodipine Besylate 10 Mg Tabs (Amlodipine Besylate) .Marland Kitchen.. 1 Tab By Mouth Daily 9)  Tussionex Pennkinetic Er 10-8 Mg/68ml Lqcr (Hydrocod Polst-Chlorphen Polst) .Marland Kitchen.. 1 Tsp By Mouth At Bedtime As Needed Cough  Allergies (verified): No Known Drug Allergies  Past History:  Past medical, surgical, family and social histories (including risk factors) reviewed, and no changes noted (except as noted below).  Past Medical History: Reviewed history from 05/24/2010 and no changes required. Allergic rhinitis Anxiety Hypertension Recurrent sinusitis per Dr. Jenne Campus with  ENT 2011  Past Surgical History: Reviewed history from 08/07/2008 and no changes required. C/S due incr BP  1985 NSVD  VBAC  1989 C5/6 Discectomy  2001 Colonoscopy WNL 01/03/2006    5 years Renal artery U/S [place] arteries but no stenosis  04/2202008 R foot surgery Bunion , 2 hammertoes and tendon replacement (Dr Charlsie Merles) 11/2007 Endometrial Bx B9 Endometrium w/ Breakdown changes.(Dr Kinscius)  03/27/2008 Pelvic U/S Nml 04/09/2008  Family History: Reviewed history from 08/14/2009 and no changes required. Father dec 68  CHF HTN Lung Failure(Vent x 4 wks) Colon Ca Ulcerated Pneumonia Mother A 1  Colon CA Partial Colectomy Melanomax2 Brother A 45 HTN Polyps Sister A  52 COPD Polyps Sister A 45 Htn Polyps Sister  A 42 Polyps  Social History: Reviewed history from 08/07/2008 and no changes required. Occupation:Branch Occupational hygienist Married  2 children  Review of Systems       area on vaginal lip present for years... tampon pulls on area and is iritated. General:  Denies fatigue and fever. CV:  Denies chest pain or discomfort. Resp:  Denies shortness of breath. GI:  Denies abdominal pain. GU:  Denies dysuria.  Physical Exam  General:  Well-developed,well-nourished,in no acute distress; alert,appropriate and  cooperative throughout examination, minimally congested, mildly hoarse. Ears:  External ear exam shows no significant lesions or deformities.  Otoscopic examination reveals clear canals, tympanic membranes are intact bilaterally without bulging, retraction, inflammation or discharge. Hearing is grossly normal bilaterally. Nose:  mild inflamm with clear mucous. Mouth:  Oral mucosa and oropharynx without lesions or exudates.  Teeth in good repair. Mild PND. Neck:  No deformities, masses, or tenderness noted. Chest Wall:  No deformities, masses, or tenderness noted. Breasts:  No mass, nodules, thickening, tenderness, bulging, retraction, inflamation, nipple discharge or skin changes noted.   Lungs:  Normal respiratory effort, chest expands symmetrically. Lungs are clear to auscultation, no crackles or wheezes. Heart:  Normal rate and regular rhythm. S1 and S2 normal without gallop, murmur, click, rub or other extra sounds. Abdomen:  Bowel sounds positive,abdomen soft and non-tender without masses, organomegaly or hernias noted. Genitalia:  Pelvic Exam:        External: normal female genitalia without lesions or masses               Adnexa: normal bimanual exam without masses or fullness        Uterus: normal by palpation        Pap smear: not performed Pulses:  R and L carotid,radial,femoral,dorsalis pedis and posterior tibial pulses are full and equal bilaterally Extremities:  No clubbing, cyanosis, edema, or deformity noted with normal full range of motion of all joints.   Skin:  Intact without suspicious lesions or rashes, tanned. Psych:  Cognition and judgment appear intact. Alert and cooperative with normal attention span and concentration. No apparent delusions, illusions, hallucinations   Impression & Recommendations:  Problem # 1:  EXAMINATION, ROUTINE MEDICAL (ICD-V70.0) The patient's preventative maintenance and recommended screening tests for an annual wellness exam were reviewed in  full today. Brought up to date unless services declined.  Counselled on the importance of diet, exercise, and its role in overall health and mortality. The patient's FH and SH was reviewed, including their home life, tobacco status, and drug and alcohol status.     Problem # 2:  Gynecological examination-routine (ICD-V72.31) DVE no pap.   Problem # 3:  COUGH, CHRONIC (ICD-786.2) Assessment: Unchanged Have ENT doctor evaluate throat with laryngoscopy at upcoming appt. Change to brand name Hyzaar? if this is triggering cough.   Tussionex has helped her sleep through cough at night in past... will prescribe again.   Problem # 4:  HYPERTENSION (ICD-401.9) Assessment: Deteriorated Poor control off metoprolol...given no change is cough... restart. Contnue amlodipine. Change to brand hyzaar as above.  Her updated medication list for this problem includes:    Hyzaar 100-12.5 Mg Tabs (Losartan potassium-hctz) ..... One tab by mouth every am    Metoprolol Succinate 100 Mg Xr24h-tab (Metoprolol succinate) ..... Onetab by mouth at night    Amlodipine Besylate 10 Mg Tabs (Amlodipine besylate) .Marland Kitchen... 1 tab by mouth daily  Problem # 5:  ANXIETY (ICD-300.00) Assessment: Unchanged Stable control.  Her updated  medication list for this problem includes:    Zoloft 50 Mg Tabs (Sertraline hcl) .Marland Kitchen... Take 1/2   by mouth each am  Complete Medication List: 1)  Zoloft 50 Mg Tabs (Sertraline hcl) .... Take 1/2   by mouth each am 2)  Calcium 500 Mg Tabs (Calcium) .Marland Kitchen.. 1 once daily per patient 3)  Zyrtec Allergy 10 Mg Tabs (Cetirizine hcl) .... Take one tablet by mouth daily 4)  Hyzaar 100-12.5 Mg Tabs (Losartan potassium-hctz) .... One tab by mouth every am 5)  B-12 250 Mcg Tabs (Cyanocobalamin) .Marland Kitchen.. 1 tablet daily by mouth 6)  Metoprolol Succinate 100 Mg Xr24h-tab (Metoprolol succinate) .... Onetab by mouth at night 7)  Pantoprazole Sodium 40 Mg Tbec (Pantoprazole sodium) .... Take 1 tablet by mouth once a  day 8)  Amlodipine Besylate 10 Mg Tabs (Amlodipine besylate) .Marland Kitchen.. 1 tab by mouth daily 9)  Tussionex Pennkinetic Er 10-8 Mg/14ml Lqcr (Hydrocod polst-chlorphen polst) .Marland Kitchen.. 1 tsp by mouth at bedtime as needed cough  Other Orders: Radiology Referral (Radiology)  Patient Instructions: 1)  Have ENT doctor evaluate throat with laryngoscopy at upcoming appt. 2)  Restart metoprolol. 3)  Change to brand name Hyzaar. 4)   Continue amlodipine. 5)  Follow BPs at home.  6)   Referral Appointment Information 7)  Day/Date: 8)  Time: 9)  Place/MD: 10)  Address: 11)  Phone/Fax: 12)  Patient given appointment information. Information/Orders faxed/mailed.  13)  Please schedule a follow-up appointment in 1 month 30 min.  Prescriptions: HYZAAR 100-12.5 MG TABS (LOSARTAN POTASSIUM-HCTZ) one tab by mouth every am Brand medically necessary #30 x 11   Entered and Authorized by:   Kerby Nora MD   Signed by:   Kerby Nora MD on 10/20/2010   Method used:   Print then Give to Patient   RxID:   [id number] TUSSIONEX PENNKINETIC ER 10-8 MG/5ML LQCR (HYDROCOD POLST-CHLORPHEN POLST) 1 tsp by mouth at bedtime as needed cough  #8 oz x 0   Entered and Authorized by:   Kerby Nora MD   Signed by:   Kerby Nora MD on 10/20/2010   Method used:   Print then Give to Patient   RxID:   [id number]    Orders Added: 1)  Est. Patient 40-64 years [24401] 2)  Radiology Referral [Radiology]    Current Allergies (reviewed today): No known allergies   Flu Vaccine Next Due:  Refused Flex Sig Next Due:  Not Indicated Colonoscopy Result Date:  01/04/2006 Colonoscopy Result:  normal Colonoscopy Next Due:  5 yr Hemoccult Next Due:  Not Indicated Last PAP:  Normal, Satisfactory (08/14/2009 5:31:00 PM) PAP Result Date:  10/20/2010 PAP Result:  DVE , no pap.Marland Kitchen q2-3 years

## 2010-12-10 NOTE — Consult Note (Signed)
Summary: [facility]   [facility]   Imported By: Lanelle Bal 01/20/2010 08:51:53  _____________________________________________________________________  External Attachment:    Type:   Image     Comment:   External Document

## 2010-12-10 NOTE — Assessment & Plan Note (Signed)
Summary: COUGH/DLO   Vital Signs:  Patient profile:   50 year old female Weight:      170.75 pounds Temp:     98.5 degrees F oral Pulse rate:   64 / minute Pulse rhythm:   regular BP sitting:   150 / 90  (left arm) Cuff size:   large  Vitals Entered By: Sydell Axon LPN (March 04, 2010 11:39 AM) CC: Head congestion, sinus drainage, productive cough/green at times and other times it a dry cough   History of Present Illness: Pt here for 6 weeks of dry cough, worse at night and now also in the day that is paroxysmal and kept her getting up in a meeting the other day. She has itchy eyes and sneezing.  She has not had fever or chills, no headache, no SOB, no wheezing. No belly pain, N/V.  Problems Prior to Update: 1)  Other Acute Sinusitis  (ICD-461.8) 2)  Gerd  (ICD-530.81) 3)  Other Screening Mammogram  (ICD-V76.12) 4)  Other Abscess of Vulva  (ICD-616.4) 5)  Examination, Routine Medical  (ICD-V70.0) 6)  Hypertension  (ICD-401.9) 7)  Anxiety  (ICD-300.00) 8)  Allergic Rhinitis  (ICD-477.9)  Medications Prior to Update: 1)  Zoloft 50 Mg Tabs (Sertraline Hcl) .... Take 1/2   By Mouth Each Am 2)  Calcium 500 Mg Tabs (Calcium) .Marland Kitchen.. 1 Once Daily Per Patient 3)  Claritin 10 Mg  Tabs (Loratadine) .Marland Kitchen.. 1 By Mouth Daily 4)  Hyzaar 100-12.5 Mg Tabs (Losartan Potassium-Hctz) .... One Tab By Mouth Every Am 5)  [facility] Vitamin B-6 200 Mg  Tabs (Pyridoxine Hcl) .Marland Kitchen.. 1 Daily By Mouth 6)  B-12 250 Mcg  Tabs (Cyanocobalamin) .Marland Kitchen.. 1 Tablet Daily By Mouth 7)  Metoprolol Succinate 100 Mg Xr24h-Tab (Metoprolol Succinate) .... Onetab By Mouth At Night 8)  Zegerid Otc 20-1100 Mg Caps (Omeprazole-Sodium Bicarbonate) .... Take One By Mouth Two Times A Day 9)  Mucinex Dm Maximum Strength 60-1200 Mg Xr12h-Tab (Dextromethorphan-Guaifenesin) .... As Needed  Allergies: No Known Drug Allergies  Physical Exam  General:  Well-developed,well-nourished,in no acute distress; alert,appropriate and cooperative  throughout examination, minimally congested. Head:  Normocephalic and atraumatic without obvious abnormalities. No apparent alopecia or balding. Sinuses NT. Eyes:  conjunctiva inflamed in palpebral distrib. Ears:  External ear exam shows no significant lesions or deformities.  Otoscopic examination reveals clear canals, tympanic membranes are intact bilaterally without bulging, retraction, inflammation or discharge. Hearing is grossly normal bilaterally. Nose:  mild inflamm with clear mucous. Mouth:  Oral mucosa and oropharynx without lesions or exudates.  Teeth in good repair. Mild PND. Neck:  No deformities, masses, or tenderness noted. Chest Wall:  No deformities, masses, or tenderness noted. Lungs:  Normal respiratory effort, chest expands symmetrically. Lungs are clear to auscultation, no crackles or wheezes. Heart:  Normal rate and regular rhythm. S1 and S2 normal without gallop, murmur, click, rub or other extra sounds.   Impression & Recommendations:  Problem # 1:  ALLERGIC RHINITIS (ICD-477.9) Assessment Deteriorated  See instructions. Her updated medication list for this problem includes:    Claritin 10 Mg Tabs (Loratadine) .Marland Kitchen... 1 by mouth daily  Discussed use of allergy medications and environmental measures.   Complete Medication List: 1)  Zoloft 50 Mg Tabs (Sertraline hcl) .... Take 1/2   by mouth each am 2)  Calcium 500 Mg Tabs (Calcium) .Marland Kitchen.. 1 once daily per patient 3)  Claritin 10 Mg Tabs (Loratadine) .Marland Kitchen.. 1 by mouth daily 4)  Hyzaar 100-12.5 Mg Tabs (  Losartan potassium-hctz) .... One tab by mouth every am 5)  [facility] Vitamin B-6 200 Mg Tabs (Pyridoxine hcl) .Marland Kitchen.. 1 daily by mouth 6)  B-12 250 Mcg Tabs (Cyanocobalamin) .Marland Kitchen.. 1 tablet daily by mouth 7)  Metoprolol Succinate 100 Mg Xr24h-tab (Metoprolol succinate) .... Onetab by mouth at night 8)  Zegerid Otc 20-1100 Mg Caps (Omeprazole-sodium bicarbonate) .... Take one by mouth two times a day 9)  Mucinex Dm Maximum  Strength 60-1200 Mg Xr12h-tab (Dextromethorphan-guaifenesin) .... As needed 10)  Patanol 0.1 % Soln (Olopatadine hcl) .... One drop in each eye daily.  Patient Instructions: 1)  Take Zyrtec 10mg  nightly. 2)  Use Nasonex as dir. 3)  Use Patanol as dir. 4)  Come back one week if not significantly improved. Prescriptions: PATANOL 0.1 % SOLN (OLOPATADINE HCL) one drop in each eye daily.  #1 bottle x 0   Entered and Authorized by:   Shaune Leeks MD   Signed by:   Shaune Leeks MD on 03/04/2010   Method used:   Electronically to        [id number]       [address]       [place], [place]  [address]       Ph: [phone]       Fax: [phone]   RxID:   [id number]

## 2010-12-10 NOTE — Progress Notes (Signed)
Summary: cant take tussionex  Phone Note Call from Patient Call back at Work Phone [phone]   Caller: Patient Call For: Shaune Leeks MD Summary of Call: Pt was seen last week for cough and given tussionex.  She cant take this, it causes nausea the next morning.  She is asking if something else can be called to [facility]. Initial call taken by: Lowella Petties CMA,  Mar 17, 2010 8:21 AM  Follow-up for Phone Call        try Cheratussin  [facility] Follow-up by: Shaune Leeks MD,  Mar 17, 2010 8:31 AM  Additional Follow-up for Phone Call Additional follow up Details #1::        Medicine called to pharmacy, advised pt. Additional Follow-up by: Lowella Petties CMA,  Mar 17, 2010 8:36 AM    New/Updated Medications: CHERATUSSIN AC 100-10 MG/5ML SYRP (GUAIFENESIN-CODEINE) one tsp by mouth at night as needed for cough Prescriptions: CHERATUSSIN AC 100-10 MG/5ML SYRP (GUAIFENESIN-CODEINE) one tsp by mouth at night as needed for cough  #8 oz x 0   Entered and Authorized by:   Shaune Leeks MD   Signed by:   Lowella Petties CMA on 03/17/2010   Method used:   [facility]phoned to ...       [facility]  [address] [id number]       [address]       [place], [place]  [address]       Ph: [phone]       Fax: [phone]   RxID:   [id number]

## 2010-12-10 NOTE — Progress Notes (Signed)
Summary: Referral to ENT  Phone Note Call from Patient Call back at Work Phone [phone]   Caller: Patient Call For: Dr. Dayton Martes Summary of Call: Patient says she was told to call back and let Dr. Dayton Martes know that she was feeling no better and would like to be referred to a specialist, ENT.  Please advise.  Initial call taken by: Linde Gillis CMA Duncan Dull),  January 01, 2010 10:59 AM

## 2010-12-10 NOTE — Progress Notes (Signed)
Summary: hyzaar & metoprolol  Phone Note Call from Patient Call back at Work Phone [phone]   Caller: Patient Call For: Shaune Leeks MD Summary of Call: Patient is going out of town for 2 weeks and will not have her hyzaar of her metoprolol from her mail order pharmacy before she leaves. She is asking if she could get a 2 week supply called in to [facility] [address].  Initial call taken by: Melody Comas,  September 10, 2010 10:22 AM  Follow-up for Phone Call        Patient notified of rxs Follow-up by: Melody Comas,  September 10, 2010 12:58 PM    Prescriptions: METOPROLOL SUCCINATE 100 MG XR24H-TAB (METOPROLOL SUCCINATE) onetab by mouth at night  #14 x 0   Entered and Authorized by:   Shaune Leeks MD   Signed by:   Shaune Leeks MD on 09/10/2010   Method used:   Electronically to        [id number]       [address]       [place], [place]  [address]       Ph: [phone]       Fax: [phone]   RxID:   [id number] HYZAAR 100-12.5 MG TABS (LOSARTAN POTASSIUM-HCTZ) one tab by mouth every am  #14 x 0   Entered and Authorized by:   Shaune Leeks MD   Signed by:   Shaune Leeks MD on 09/10/2010   Method used:   Electronically to        [id number]       [address]       [place], [place]  [address]       Ph: [phone]       Fax: [phone]   RxID:   [id number]

## 2010-12-10 NOTE — Assessment & Plan Note (Signed)
Summary: ROA FOR 1 MONTH FOLLOW-UP/JRR   Vital Signs:  Patient profile:   50 year old female Height:      65 inches Weight:      174.0 pounds BMI:     29.06 Temp:     97.7 degrees F oral Pulse rate:   76 / minute Pulse rhythm:   regular BP sitting:   124 / 78  (left arm) Cuff size:   large  Vitals Entered By: Benny Lennert CMA (AAMA) (November 23, 2010 9:30 AM)  History of Present Illness: Chief complaint 1 month follow up    Chronic cough.. tried change to brand Hyzaar to see if it would help. See last few OV for work up so far.  Pt reports that cough is 100% gone back on brand Hyzaar.  ENT re-visit.Marland Kitchen never did laryngoscopy.  HTN: improved control on current regimen .. restarted metoprolol and continue amlodipine.  BP control at home.. up and down.  Problems Prior to Update: 1)  Routine Gynecological Examination  (ICD-V72.31) 2)  Pure Hypercholesterolemia  (ICD-272.0) 3)  Cough, Chronic  (ICD-786.2) 4)  Gerd  (ICD-530.81) 5)  Other Screening Mammogram  (ICD-V76.12) 6)  Examination, Routine Medical  (ICD-V70.0) 7)  Hypertension  (ICD-401.9) 8)  Anxiety  (ICD-300.00) 9)  Allergic Rhinitis  (ICD-477.9)  Current Medications (verified): 1)  Zoloft 50 Mg Tabs (Sertraline Hcl) .... Take 1/2   By Mouth Each Am 2)  Calcium 500 Mg Tabs (Calcium) .Marland Kitchen.. 1 Once Daily Per Patient 3)  Claritin 10 Mg Tabs (Loratadine) .... Take 1 Tablet By Mouth Once A Day 4)  Hyzaar 100-12.5 Mg Tabs (Losartan Potassium-Hctz) .... One Tab By Mouth Every Am  Brand Name Only Generic Causes Cough 5)  B-12 250 Mcg  Tabs (Cyanocobalamin) .Marland Kitchen.. 1 Tablet Daily By Mouth 6)  Toprol Xl 100 Mg Xr24h-Tab (Metoprolol Succinate) .Marland Kitchen.. 1 Tab By Mouth Daily 7)  Omeprazole 40 Mg Cpdr (Omeprazole) .... Take 1 Tablet By Mouth Once A Day 8)  Amlodipine Besylate 10 Mg Tabs (Amlodipine Besylate) .Marland Kitchen.. 1 Tab By Mouth Daily  Allergies (verified): No Known Drug Allergies PMH-FH-SH reviewed-no changes except otherwise  noted  Review of Systems General:  Denies fatigue and fever. CV:  Denies chest pain or discomfort. Resp:  Denies shortness of breath. GI:  Denies abdominal pain. GU:  Denies dysuria.  Physical Exam  General:  Well-developed,well-nourished,in no acute distress; alert,appropriate and cooperative throughout examination Mouth:  Oral mucosa and oropharynx without lesions or exudates.  Teeth in good repair. Neck:  no carotid bruit or thyromegaly no cervical or supraclavicular lymphadenopathy  Lungs:  Normal respiratory effort, chest expands symmetrically. Lungs are clear to auscultation, no crackles or wheezes. Heart:  Normal rate and regular rhythm. S1 and S2 normal without gallop, murmur, click, rub or other extra sounds. Abdomen:  Bowel sounds positive,abdomen soft and non-tender without masses, organomegaly or hernias noted. Pulses:  R and L carotid,radial,femoral,dorsalis pedis and posterior tibial pulses are full and equal bilaterally Extremities:  No clubbing, cyanosis, edema, or deformity noted with normal full range of motion of all joints.     Impression & Recommendations:  Problem # 1:  COUGH, CHRONIC (ICD-786.2) Resolved back on brand Hyzaar. Cahnge back to other meds as she was previously taking.   Problem # 2:  HYPERTENSION (ICD-401.9) Well controlled here.. but per pt occ elevations. Follow. MAy need further med increase.  Her updated medication list for this problem includes:    Hyzaar 100-12.5 Mg Tabs (Losartan potassium-hctz) .Marland KitchenMarland KitchenMarland KitchenMarland Kitchen  One tab by mouth every am  brand name only generic causes cough    Toprol Xl 100 Mg Xr24h-tab (Metoprolol succinate) .Marland Kitchen... 1 tab by mouth daily    Amlodipine Besylate 10 Mg Tabs (Amlodipine besylate) .Marland Kitchen... 1 tab by mouth daily  Complete Medication List: 1)  Zoloft 50 Mg Tabs (Sertraline hcl) .... Take 1/2   by mouth each am 2)  Calcium 500 Mg Tabs (Calcium) .Marland Kitchen.. 1 once daily per patient 3)  Claritin 10 Mg Tabs (Loratadine) .... Take 1  tablet by mouth once a day 4)  Hyzaar 100-12.5 Mg Tabs (Losartan potassium-hctz) .... One tab by mouth every am  brand name only generic causes cough 5)  B-12 250 Mcg Tabs (Cyanocobalamin) .Marland Kitchen.. 1 tablet daily by mouth 6)  Toprol Xl 100 Mg Xr24h-tab (Metoprolol succinate) .Marland Kitchen.. 1 tab by mouth daily 7)  Omeprazole 40 Mg Cpdr (Omeprazole) .... Take 1 tablet by mouth once a day 8)  Amlodipine Besylate 10 Mg Tabs (Amlodipine besylate) .Marland Kitchen.. 1 tab by mouth daily  Patient Instructions: 1)  Follow BP at home.. call if above 140/90.  2)  Please schedule a follow-up appointment in 6 months HTN. Prescriptions: TOPROL XL 100 MG XR24H-TAB (METOPROLOL SUCCINATE) 1 tab by mouth daily Brand medically necessary #30 x 11   Entered and Authorized by:   Kerby Nora MD   Signed by:   Kerby Nora MD on 11/23/2010   Method used:   Electronically to        [id number]       [address]       [place], [place]  [address]       Ph: [phone]       Fax: [phone]   RxID:   [id number]    Orders Added: 1)  Est. Patient Level III [25366]    Current Allergies (reviewed today): No known allergies

## 2010-12-10 NOTE — Assessment & Plan Note (Signed)
Summary: URI   Vital Signs:  Patient profile:   50 year old female Height:      65 inches Weight:      168.50 pounds BMI:     28.14 Temp:     97.9 degrees F oral Pulse rate:   84 / minute Pulse rhythm:   regular BP sitting:   122 / 78  (left arm) Cuff size:   regular  Vitals Entered By: Delilah Shan CMA Duncan Dull) (December 24, 2009 12:42 PM) CC: URI   History of Present Illness: Pt returns for persistent URI symptoms. Seen on 1/19 for here congestion for 3 weeks that improved and then has worsened again. Took amoxicillin, symptoms never really improved.  Still has frontal headache, ear pain, teeth pain, runny nose.  Chills, no fever.  No sore throat.  Taking Mucinex OTC.   No shortness of breath or wheezing.    Current Medications (verified): 1)  Zoloft 50 Mg Tabs (Sertraline Hcl) .... Take 1/2   By Mouth Each Am 2)  Calcium 500 Mg Tabs (Calcium) .Marland Kitchen.. 1 Once Daily Per Patient 3)  Claritin 10 Mg  Tabs (Loratadine) .Marland Kitchen.. 1 By Mouth Daily 4)  Hyzaar 100-12.5 Mg Tabs (Losartan Potassium-Hctz) .... One Tab By Mouth Every Am 5)  Cvs Vitamin B-6 200 Mg  Tabs (Pyridoxine Hcl) .Marland Kitchen.. 1 Daily By Mouth 6)  B-12 250 Mcg  Tabs (Cyanocobalamin) .Marland Kitchen.. 1 Tablet Daily By Mouth 7)  Metoprolol Succinate 100 Mg Xr24h-Tab (Metoprolol Succinate) .... Onetab By Mouth At Night 8)  Zegerid Otc 20-1100 Mg Caps (Omeprazole-Sodium Bicarbonate) .... Take One By Mouth Two Times A Day 9)  Mucinex Dm Maximum Strength 60-1200 Mg Xr12h-Tab (Dextromethorphan-Guaifenesin) .... As Needed 10)  Azithromycin 250 Mg  Tabs (Azithromycin) .... 2 By  Mouth Today and Then 1 Daily For 4 Days  Allergies (verified): No Known Drug Allergies  Review of Systems      See HPI General:  Complains of chills; denies fever. ENT:  Complains of nasal congestion, sinus pressure, and sore throat. CV:  Denies chest pain or discomfort. Resp:  Complains of cough; denies shortness of breath, sputum productive, and wheezing. GI:  Denies  abdominal pain, diarrhea, nausea, and vomiting.  Physical Exam  General:  Well-developed,well-nourished,in no acute distress; alert,appropriate and cooperative throughout examination, mildly congested. Nose:  external erythema.   Frontal sinsuses TTP Mouth:  Oral mucosa and oropharynx without lesions or exudates.  Teeth in good repair. Mild PND. Lungs:  Normal respiratory effort, chest expands symmetrically. Lungs are clear to auscultation, no crackles or wheezes. Heart:  Normal rate and regular rhythm. S1 and S2 normal without gallop, murmur, click, rub or other extra sounds. Msk:  No deformity or scoliosis noted of thoracic or lumbar spine.   Psych:  Cognition and judgment appear intact. Alert and cooperative with normal attention span and concentration. No apparent delusions, illusions, hallucinations   Impression & Recommendations:  Problem # 1:  OTHER ACUTE SINUSITIS (ICD-461.8) Assessment Deteriorated s/p course of amoxicillin.  Will try course of Zpack, continue Mucinex.  See pt instructions. The following medications were removed from the medication list:    Amoxicillin 500 Mg Caps (Amoxicillin) ..... One tab by mouth three times a day    Guaifenesin-codeine 100-10 Mg/29ml Syrp (Guaifenesin-codeine) .Marland Kitchen... 1-2 tsp by mouth up to every 4-6 hours as needed severe cough Her updated medication list for this problem includes:    Mucinex Dm Maximum Strength 60-1200 Mg Xr12h-tab (Dextromethorphan-guaifenesin) .Marland Kitchen... As needed  Azithromycin 250 Mg Tabs (Azithromycin) .Marland Kitchen... 2 by  mouth today and then 1 daily for 4 days  Complete Medication List: 1)  Zoloft 50 Mg Tabs (Sertraline hcl) .... Take 1/2   by mouth each am 2)  Calcium 500 Mg Tabs (Calcium) .Marland Kitchen.. 1 once daily per patient 3)  Claritin 10 Mg Tabs (Loratadine) .Marland Kitchen.. 1 by mouth daily 4)  Hyzaar 100-12.5 Mg Tabs (Losartan potassium-hctz) .... One tab by mouth every am 5)  Cvs Vitamin B-6 200 Mg Tabs (Pyridoxine hcl) .Marland Kitchen.. 1 daily by  mouth 6)  B-12 250 Mcg Tabs (Cyanocobalamin) .Marland Kitchen.. 1 tablet daily by mouth 7)  Metoprolol Succinate 100 Mg Xr24h-tab (Metoprolol succinate) .... Onetab by mouth at night 8)  Zegerid Otc 20-1100 Mg Caps (Omeprazole-sodium bicarbonate) .... Take one by mouth two times a day 9)  Mucinex Dm Maximum Strength 60-1200 Mg Xr12h-tab (Dextromethorphan-guaifenesin) .... As needed 10)  Azithromycin 250 Mg Tabs (Azithromycin) .... 2 by  mouth today and then 1 daily for 4 days  Patient Instructions: 1)  Take Zpack  as directed.  Drink lots of fluids.  Treat sympotmatically with Mucinex, nasal saline irrigation, and Tylenol/Ibuprofen. I would stay away from the Vicks since it seemed to cause an allergic reaction.  You can use warm compresses.  Cough suppressant at night. Call if not improving as expected in 5-7 days.  Prescriptions: AZITHROMYCIN 250 MG  TABS (AZITHROMYCIN) 2 by  mouth today and then 1 daily for 4 days  #6 x 0   Entered and Authorized by:   Ruthe Mannan MD   Signed by:   Ruthe Mannan MD on 12/24/2009   Method used:   [facility]        [id number]       [address]       [place], [place]  [address]       Ph: [phone]       Fax: [phone]   RxID:   [id number]   Current Allergies (reviewed today): No known allergies

## 2010-12-10 NOTE — Assessment & Plan Note (Signed)
Summary: COLD/CLE   Vital Signs:  Patient profile:   50 year old female Weight:      169 pounds Temp:     98.6 degrees F oral Pulse rate:   72 / minute Pulse rhythm:   regular BP sitting:   150 / 88  (left arm) Cuff size:   regular  Vitals Entered By: Sydell Axon LPN (November 26, 2009 12:23 PM) CC: Has had a cold since January 1st, productive cough-green, a lot of head congestion green and some blood   History of Present Illness: Pt here congestion since the first of the month which improved and then has worsened again. She has headache in the fronatal/max area, some ear pain, some teeth pain, some rhinitis that is green in the AM...then clears, no ST, significant cough productive as above. She also had bloody nose.  She has had lowgrade fever at night (100) and some chills. She has taken Mucinex and Robitussin DM.    Problems Prior to Update: 1)  Gerd  (ICD-530.81) 2)  Other Screening Mammogram  (ICD-V76.12) 3)  Other Abscess of Vulva  (ICD-616.4) 4)  Examination, Routine Medical  (ICD-V70.0) 5)  Hypertension  (ICD-401.9) 6)  Anxiety  (ICD-300.00) 7)  Allergic Rhinitis  (ICD-477.9)  Medications Prior to Update: 1)  Zoloft 50 Mg Tabs (Sertraline Hcl) .... Take 1/2   By Mouth Each Am 2)  Calcium 500 Mg Tabs (Calcium) .Marland Kitchen.. 1 Once Daily Per Patient 3)  Claritin 10 Mg  Tabs (Loratadine) .Marland Kitchen.. 1 By Mouth Daily 4)  Hyzaar 100-12.5 Mg Tabs (Losartan Potassium-Hctz) .... One Tab By Mouth Every Am 5)  Cvs Vitamin B-6 200 Mg  Tabs (Pyridoxine Hcl) .Marland Kitchen.. 1 Daily By Mouth 6)  B-12 250 Mcg  Tabs (Cyanocobalamin) .Marland Kitchen.. 1 Tablet Daily By Mouth 7)  Metoprolol Succinate 100 Mg Xr24h-Tab (Metoprolol Succinate) .... Onetab By Mouth At Night 8)  Zegerid Otc 20-1100 Mg Caps (Omeprazole-Sodium Bicarbonate) .... Take One By Mouth Two Times A Day 9)  Nexium 40 Mg Pack (Esomeprazole Magnesium) .... One Tab By Mouth 45 Mins Prior To Brfst  Allergies: No Known Drug Allergies  Physical  Exam  General:  Well-developed,well-nourished,in no acute distress; alert,appropriate and cooperative throughout examination, mildly congested. Head:  Normocephalic and atraumatic without obvious abnormalities. No apparent alopecia or balding. Sinuses tender in frontal and max distrib. Eyes:  Conjunctiva clear bilaterally.  Ears:  External ear exam shows no significant lesions or deformities.  Otoscopic examination reveals clear canals, tympanic membranes are intact bilaterally without bulging, retraction, inflammation or discharge. Hearing is grossly normal bilaterally. Nose:  External nasal examination shows no deformity or inflammation. Nasal mucosa are pink and moist without lesions or exudates, but mild inflammation.. Mouth:  Oral mucosa and oropharynx without lesions or exudates.  Teeth in good repair. Mild PND. Neck:  No deformities, masses, or tenderness noted. Chest Wall:  No deformities, masses, or tenderness noted. Lungs:  Normal respiratory effort, chest expands symmetrically. Lungs are clear to auscultation, no crackles or wheezes. Heart:  Normal rate and regular rhythm. S1 and S2 normal without gallop, murmur, click, rub or other extra sounds.   Impression & Recommendations:  Problem # 1:  SINUSITIS - ACUTE-NOS (ICD-461.9) Assessment New  See instructions. Her updated medication list for this problem includes:    Mucinex Dm Maximum Strength 60-1200 Mg Xr12h-tab (Dextromethorphan-guaifenesin) .Marland Kitchen... As needed    Amoxicillin 500 Mg Caps (Amoxicillin) ..... One tab by mouth three times a day  Instructed on treatment. Call if  symptoms persist or worsen.   Complete Medication List: 1)  Zoloft 50 Mg Tabs (Sertraline hcl) .... Take 1/2   by mouth each am 2)  Calcium 500 Mg Tabs (Calcium) .Marland Kitchen.. 1 once daily per patient 3)  Claritin 10 Mg Tabs (Loratadine) .Marland Kitchen.. 1 by mouth daily 4)  Hyzaar 100-12.5 Mg Tabs (Losartan potassium-hctz) .... One tab by mouth every am 5)  Cvs Vitamin B-6  200 Mg Tabs (Pyridoxine hcl) .Marland Kitchen.. 1 daily by mouth 6)  B-12 250 Mcg Tabs (Cyanocobalamin) .Marland Kitchen.. 1 tablet daily by mouth 7)  Metoprolol Succinate 100 Mg Xr24h-tab (Metoprolol succinate) .... Onetab by mouth at night 8)  Zegerid Otc 20-1100 Mg Caps (Omeprazole-sodium bicarbonate) .... Take one by mouth two times a day 9)  Mucinex Dm Maximum Strength 60-1200 Mg Xr12h-tab (Dextromethorphan-guaifenesin) .... As needed 10)  Amoxicillin 500 Mg Caps (Amoxicillin) .... One tab by mouth three times a day  Patient Instructions: 1)  Take Amox as prescribed. 2)  Take Guaifenesin by [facility] or [facility] and getting MUCOUS RELIEF EXPECTORANT (400mg ), take 11/2 tabs by mouth AM and NOON. 3)  Drink lots of fluids anytime taking Guaifenesin.   4)  Take Tyl ES 2 three times a day  5)  Gargle with warm salt water as often as possible. 6)  RTC/call if sxs worsen. Prescriptions: AMOXICILLIN 500 MG CAPS (AMOXICILLIN) one tab by mouth three times a day  #42 x 0   Entered and Authorized by:   Shaune Leeks MD   Signed by:   Shaune Leeks MD on 11/26/2009   Method used:   [facility]        [facility]  [address]       [address]       [place], [place]  [address]       Ph: [phone] or [phone]       Fax: [phone]   RxID:   [id number]   Current Allergies (reviewed today): No known allergies

## 2010-12-10 NOTE — Progress Notes (Signed)
Summary: refill request for cough medicine  Phone Note Refill Request Message from:  Fax from Pharmacy  Refills Requested: Medication #1:  GUAIFENESIN-CODEINE 100-10 MG/5ML SYRP 1-2 tsp by mouth up to every 4-6 hours as needed severe cough.   Last Refilled: 11/28/2009 Faxed request from [facility],  phone [phone].  Initial call taken by: Lowella Petties CMA,  December 10, 2009 12:55 PM  Follow-up for Phone Call        Rx called to pharmacy Follow-up by: Sydell Axon LPN,  December 10, 2009 2:03 PM    Prescriptions: GUAIFENESIN-CODEINE 100-10 MG/5ML SYRP (GUAIFENESIN-CODEINE) 1-2 tsp by mouth up to every 4-6 hours as needed severe cough  #120cc x 0   Entered by:   Shaune Leeks MD   Authorized by:   Judith Part MD   Signed by:   Shaune Leeks MD on 12/10/2009   Method used:   [facility]ph[facility] ...       [facility]  [address] [id number]       [address]       [place], [place]  [address]       Ph: [phone]       Fax: [phone]   RxID:   [id number]

## 2011-01-19 LAB — COMPREHENSIVE METABOLIC PANEL
ALT: 15 U/L (ref 0–35)
AST: 21 U/L (ref 0–37)
Calcium: 9.1 mg/dL (ref 8.4–10.5)
Creatinine, Ser: 0.58 mg/dL (ref 0.4–1.2)
GFR calc Af Amer: >60 mL/min (ref >60)
Glucose, Bld: 110 mg/dL — ABNORMAL HIGH (ref 70–99)
Sodium: 130 mEq/L — ABNORMAL LOW (ref 135–145)
Total Protein: 6.8 g/dL (ref 6.0–8.3)

## 2011-01-19 LAB — CBC
HCT: 36.9 % (ref 36.0–46.0)
Hemoglobin: 13 g/dL (ref 12.0–15.0)
MCH: 33.1 pg (ref 26.0–34.0)
MCHC: 35.2 g/dL (ref 30.0–36.0)
MCV: 93.9 fL (ref 78.0–100.0)
Platelets: 269 K/uL (ref 150–400)
RBC: 3.93 MIL/uL (ref 3.87–5.11)
RDW: 12.6 % (ref 11.5–15.5)
WBC: 6.1 K/uL (ref 4.0–10.5)

## 2011-01-19 LAB — POCT CARDIAC MARKERS
CKMB, poc: 1.1 ng/mL (ref 1.0–8.0)
CKMB, poc: <1 ng/mL — ABNORMAL LOW (ref 1.0–8.0)
CKMB, poc: <1 ng/mL — ABNORMAL LOW (ref 1.0–8.0)
Myoglobin, poc: 22.1 ng/mL (ref 12–200)
Myoglobin, poc: 23.7 ng/mL (ref 12–200)
Myoglobin, poc: 44.2 ng/mL (ref 12–200)
Troponin i, poc: <0.05 ng/mL (ref 0.00–0.09)
Troponin i, poc: <0.05 ng/mL (ref 0.00–0.09)
Troponin i, poc: <0.05 ng/mL (ref 0.00–0.09)

## 2011-01-19 LAB — DIFFERENTIAL
Basophils Absolute: 0.1 K/uL (ref 0.0–0.1)
Basophils Relative: 1 % (ref 0–1)
Eosinophils Absolute: 0.4 10*3/uL (ref 0.0–0.7)
Eosinophils Relative: 6 % — ABNORMAL HIGH (ref 0–5)
Lymphocytes Relative: 16 % (ref 12–46)
Lymphs Abs: 1 10*3/uL (ref 0.7–4.0)
Monocytes Absolute: 0.4 K/uL (ref 0.1–1.0)
Monocytes Relative: 7 % (ref 3–12)
Neutro Abs: 4.3 K/uL (ref 1.7–7.7)
Neutrophils Relative %: 70 % (ref 43–77)

## 2011-01-19 LAB — COMPREHENSIVE METABOLIC PANEL WITH GFR
Albumin: 3.8 g/dL (ref 3.5–5.2)
Alkaline Phosphatase: 54 U/L (ref 39–117)
BUN: 6 mg/dL (ref 6–23)
CO2: 24 meq/L (ref 19–32)
Chloride: 98 meq/L (ref 96–112)
GFR calc non Af Amer: >60 mL/min (ref >60)
Potassium: 3.5 meq/L (ref 3.5–5.1)
Total Bilirubin: 0.3 mg/dL (ref 0.3–1.2)

## 2011-03-26 NOTE — Assessment & Plan Note (Signed)
[facility]                                 ON-CALL NOTE   [name], Amanda Estrada                    MRN:          [id number]  DATE:02/15/2007                            DOB:          [birth year]    Patient of Dr. Hetty Ely.   She calls from [phone] at 7:39 a.m. on February 15, 2007 complaining of  high blood pressure. She states her blood pressure was 190/110 earlier  this morning. She took it because she was feeling very dizzy. She took  her Micardis and then waited 30 minutes and her blood pressure is now  180/100. The patient denies any chest pain or shortness of breath but  states she is very dizzy. I explained that the only thing to do this  morning at this time would be to go to the emergency room if she felt  the symptoms were not subsiding. The other option would be to call the  office at 8:30 in the morning and get an appointment for this morning  first thing.     Lelon Perla, DO  Electronically Signed    Shawnie Dapper  DD: 02/15/2007  DT: 02/15/2007  Job #: [id number]   cc:   Arta Silence, MD

## 2011-03-26 NOTE — Op Note (Signed)
[facility]  Patient:    Amanda Estrada, Amanda Estrada                    MRN: [id number] Proc. Date: 10/03/00 Adm. Date:  [id number] Attending:  Donn Pierini                           Operative Report  PREOPERATIVE DIAGNOSIS:  Left C5-6 herniated nucleus pulposus.  POSTOPERATIVE DIAGNOSIS:  Left C5-6 herniated nucleus pulposus.  PROCEDURE PERFORMED:  C5-6 anterior cervical diskectomy and fusion with allograft and anterior plate instrumentation.  SURGEON:  Julio Sicks, M.D.  ASSISTANT:  Reinaldo Meeker, M.D.  ANESTHESIA:  General endotracheal.  INDICATIONS:  Amanda Estrada is a 50 year old white female with a history of neck and left upper extremity pain, paresthesias, and weakness consistent with a left-sided C6 nerve root, which she failed conservative management.  MRI scanning demonstrates an enlarged left-sided C5-6 disk herniation causing compression of the lateral aspect of the spinal cord at this level as well as the exiting left-sided C6 nerve root.  The patient has been counseled as to her options.  She has decided to proceed with C5-6 anterior cervical diskectomy and fusion with allograft and anterior plating with hopes of relief of her symptoms.  DESCRIPTION OF PROCEDURE:  The patient is taken to the operating room and placed on the operating table in the supine position.  After an adequate level of anesthesia was achieved, the patient was positioned supine with her neck slightly extended and placed in five pounds of halter traction.  The patients anterior cervical region is shaved, prepped, and sterilely draped.  A 10 blade is used to make a linear skin incision overlying the C5-6 interspace.  This was carried down sharply to the platysma.  The platysma was then elevated and divided vertically.  Dissection proceeded along the medial border of the sternocleidomastoid muscle and carotid sheath on the right side.  The trachea and esophagus  were mobilized and directed towards the left.  The prevertebral fascia was stripped off the anterior spinal column.  The longus coli muscles were then elevated bilaterally using electrocautery.  A deep self-retractor was placed and intraoperative fluoroscopy was used, and the level was confirmed.  A diskectomy was then performed by incising the C5-6 disk space with a 15 blade in an articular fashion.  A wide disk space was achieved using Rogers upward angle and backward angle Carlin curets.  Kerrison rongeurs and high speed drill were also used.  Disk was removed at the level of the posterior annulus.  The microscope was brought into the field and used throughout the remainder of the diskectomy, and the remaining aspects of the annulus and outstretch were removed using the high speed drill and Kerrison rongeurs.  The posterior longitudinal was then resected in the usual fashion using the Kerrison rongeurs.  The underlying thecal sac was identified. Decompression then proceeded out towards the left side.  A large amount of subligamentous disk herniation was then encountered and completely resected. Dissection then proceeded out to the left side of the C6 foramen.  The C6 nerve root was identified.  A large amount of foraminal disk herniation was also removed during this time.  At this point, a very thorough anterior foraminotomy had been performed at C6 on the left side.  There was no evidence of decompression.  Decompression had proceeded out to the right side of C6 foramen.  Once again there was no evidence of decompression.  At this point, a blunt probe was passed easily along the course of the exiting C6 nerve roots bilaterally.  There was no evidence of any spinal stenosis.  The wound was then copiously irrigated and Gelfoam was placed for hemostasis.  The disk space was then distracted and a 7 mm fibular allograft was impacted into place and recessed approximately 1 mm from the anterior  cortical surface.  A 23 mm Atlantis anterior cervical plate was then attached to the C5 and C6 levels in a standard fashion.  Pilot holes were drilled under fluoroscopic guidance. Each pilot hole was tapped and 13 mm fixed angle screws were placed at all four locations.  All screws were given the final tightening and found to be consolidated bone.  Locking screws were engaged.  Final images revealed good position of the bone graft and hardware at their proper level with normal alignment of the spine.  The retraction system was removed.  The wound was then copiously irrigated with antibiotic solution.  The wound was inspected for hemostasis which was found to be good.  The platysma was reapproximated with 3-0 Vicryl suture.  The skin was reapproximated with 5-0 PDS. Steri-Strips and a sterile dressing were applied.  There were no apparent complications.  The patient tolerated the procedure well, and she returned to the recovery room. DD:  10/03/00 TD:  10/03/00 Job: [id number]

## 2011-05-14 ENCOUNTER — Other Ambulatory Visit: Payer: Self-pay | Admitting: Podiatrist

## 2011-05-14 DIAGNOSIS — R52 Pain, unspecified: Secondary | ICD-10-CM

## 2011-05-19 ENCOUNTER — Ambulatory Visit
Admission: RE | Admit: 2011-05-19 | Discharge: 2011-05-19 | Disposition: A | Discharge status: Home / Self Care | Payer: 59 | Source: Ambulatory Visit | Attending: Podiatrist | Admitting: Podiatrist

## 2011-05-19 DIAGNOSIS — R52 Pain, unspecified: Secondary | ICD-10-CM

## 2011-05-20 ENCOUNTER — Other Ambulatory Visit: Payer: Self-pay

## 2011-10-18 ENCOUNTER — Other Ambulatory Visit: Payer: Self-pay | Admitting: Family Medicine

## 2011-11-04 ENCOUNTER — Other Ambulatory Visit: Payer: Self-pay | Admitting: Family Medicine

## 2011-11-19 ENCOUNTER — Encounter: Payer: Self-pay | Admitting: Family Medicine

## 2011-11-19 ENCOUNTER — Ambulatory Visit (INDEPENDENT_AMBULATORY_CARE_PROVIDER_SITE_OTHER): Payer: 59 | Admitting: Family Medicine

## 2011-11-19 DIAGNOSIS — J069 Acute upper respiratory infection, unspecified: Secondary | ICD-10-CM

## 2011-11-19 MED ORDER — HYDROCODONE-HOMATROPINE 5-1.5 MG/5ML PO SYRP: 5.0000 mL | ORAL_SOLUTION | Freq: Three times a day (TID) | ORAL | Status: AC | PRN

## 2011-11-19 NOTE — Progress Notes (Signed)
duration of symptoms: 2 days rhinorrhea:yes Congestion: yes ear pain: yesterday yes, much less today sore throat:yes Headache: yes Cough:yes, no sputum Myalgias: some, mild, better today other concerns:mult sick contacts Most troubled by sneezing and cough  ROS: See HPI.  Otherwise negative.    Meds, vitals, and allergies reviewed.   GEN: nad, alert and oriented HEENT: mucous membranes moist, TM w/o erythema, nasal epithelium injected, OP with cobblestoning, sinuses not ttp, poor TM movement on valsalva NECK: supple w/o LA CV: rrr. PULM: ctab, no inc wob ABD: soft, +bs EXT: no edema

## 2011-11-19 NOTE — Patient Instructions (Signed)
Drink plenty of fluids, take tylenol as needed, and gargle with warm salt water for your throat.  Take the cough medicine an needed.  This should gradually improve.  Take care.  Let us know if you have other concerns.

## 2011-11-21 DIAGNOSIS — J069 Acute upper respiratory infection, unspecified: Secondary | ICD-10-CM | POA: Insufficient documentation

## 2011-11-21 NOTE — Assessment & Plan Note (Signed)
Supportive tx, sedation caution on cough med, nontoxic, likely viral.  F/u prn.

## 2011-12-15 ENCOUNTER — Encounter: Payer: Self-pay | Admitting: Family Medicine

## 2011-12-15 ENCOUNTER — Ambulatory Visit (INDEPENDENT_AMBULATORY_CARE_PROVIDER_SITE_OTHER): Payer: 59 | Admitting: Family Medicine

## 2011-12-15 VITALS — BP 130/84 | HR 71 | Temp 97.7°F | Wt 184.0 lb

## 2011-12-15 DIAGNOSIS — J069 Acute upper respiratory infection, unspecified: Secondary | ICD-10-CM

## 2011-12-15 MED ORDER — AMOXICILLIN-POT CLAVULANATE 875-125 MG PO TABS: 1.0000 | ORAL_TABLET | Freq: Two times a day (BID) | ORAL | Status: DC

## 2011-12-15 NOTE — Patient Instructions (Signed)
Take Augmentin as directed.  Drink lots of fluids.  Treat sympotmatically with Mucinex, nasal saline irrigation, and Tylenol/Ibuprofen. Also try claritin D or zyrtec D over the counter- two times a day as needed ( have to sign for them at pharmacy). You can use warm compresses.  Cough suppressant at night. Call if not improving as expected in 5-7 days.    

## 2011-12-15 NOTE — Progress Notes (Signed)
SUBJECTIVE:  Amanda Estrada is a 51 y.o. female who complains of coryza, congestion, cough and bilateral sinus pain for 30 days. She denies a history of anorexia, chest pain, chills and dizziness and denies a history of asthma. Patient denies smoke cigarettes.   Patient Active Problem List  Diagnoses  . PURE HYPERCHOLESTEROLEMIA  . ANXIETY  . HYPERTENSION  . ALLERGIC RHINITIS  . GERD  . COUGH, CHRONIC  . URI (upper respiratory infection)   Past Medical History  Diagnosis Date  . Allergy   . Anxiety   . Hypertension   . Sinusitis 2011    Recurrent per Dr. Jenne Campus with [facility]   Past Surgical History  Procedure Date  . Cesarean section 1985    Due to incr BP  . Nsvd 1989    VBAC  . Cervical discectomy 2001    C5/6  . Renal artery [place] 02/2007    [place] arteries but no stenosis  . Bunionectomy 11/2007    Right foot, 2 hammer-toes and tendon replacement (Dr. Charlsie Merles)  . Endometrial biopsy 03/27/2008    B9 endometrium w/breakdown changes (Dr, Kinscius)  . US transvaginal pelvic modified 04/09/2008    Normal   History  Substance Use Topics  . Smoking status: Never Smoker   . Smokeless tobacco: Not on file  . Alcohol Use: Yes     2 glasses of wine every day   Family History  Problem Relation Age of Onset  . Cancer Mother     Colon, partial colectomy, melanoma x 2  . Heart disease Father     CHF  . Hypertension Father   . Cancer Father     Colon  . Pneumonia Father   . COPD Father     Lung failure (Vent x 4 weeks)  . COPD Sister   . Hypertension Brother   . Hypertension Sister    No Known Allergies Current Outpatient Prescriptions on File Prior to Visit  Medication Sig Dispense Refill  . HYZAAR 100-12.5 MG per tablet TAKE 1 TABLET BY MOUTH EVERY MORNING  30 tablet  10  . loratadine (CLARITIN) 10 MG tablet Take 10 mg by mouth daily.      Marland Kitchen omeprazole (PRILOSEC) 40 MG capsule Take 40 mg by mouth daily.      . sertraline (ZOLOFT) 50 MG tablet TAKE 1/2  TABLET BY MOUTH EACH AM  45 tablet  1  . vitamin B-12 (CYANOCOBALAMIN) 250 MCG tablet Take 250 mcg by mouth daily.       The PMH, PSH, Social History, Family History, Medications, and allergies have been reviewed in [facility], and have been updated if relevant.  OBJECTIVE: BP 130/84  Pulse 71  Temp(Src) 97.7 F (36.5 C) (Oral)  Wt 184 lb (83.462 kg)  She appears well, vital signs are as noted. Ears normal.  Throat and pharynx normal.  Neck supple. No adenopathy in the neck. Nose is congested. Sinuses TTP throughout. The chest is clear, without wheezes or rales.  ASSESSMENT:  sinusitis  PLAN: Given duration and progression of symptoms, will treat for bacterial sinusitis. Symptomatic therapy suggested: push fluids, rest and return office visit prn if symptoms persist or worsen.  Call or return to clinic prn if these symptoms worsen or fail to improve as anticipated.

## 2011-12-23 ENCOUNTER — Telehealth: Payer: Self-pay | Admitting: Family Medicine

## 2011-12-23 NOTE — Telephone Encounter (Signed)
Triage Record Num: [id number] Operator: Thayer Headings Patient Name: Amanda Estrada Call Date & Time: 12/23/2011 2:41:24PM Patient Phone: [phone] PCP: Kerby Nora Patient Gender: Female PCP Fax : [phone] Patient DOB: 1961/08/03 Practice Name: [facility] Reason for Call: Caller: Carola/Patient; PCP: Laurita Quint (retired); CB#: [phone]; ; ; Calling today 12/23/11 Ear pain, sore throat. Onset in January, was seen last week in office and was prescribed Amoxicillin. Calling back today b/c not working. Was dx with ear infection/sinus infection. Was supposed to take this for 10 days but she only took this for 5 days b/c still having coughing. Pt says she is not able to take generic. Afebrile. Also coughing up green sputum. Emergent symptoms r/o by Upper Respiratory Infection guidelines with exception of productive cough with colored sputum. Care advice given. Appt scheduled for tomorrow 12/24/11 at 3:30 PM with Dr. Para March. Protocol(s) Used: Upper Respiratory Infection (URI) Recommended Outcome per Protocol: See Provider within 24 hours Reason for Outcome: Productive cough with colored sputum (other than clear or white sputum) Care Advice: ~ Use a cool mist humidifier to moisten air. Be sure to clean according to manufacturer's instructions. ~ May inhale steam from hot shower or heated water. Be careful to avoid burns. Limit or avoid exposure to irritants and allergens (e.g. air pollution, smoke/smoking, chemicals, dust, pollen, pet dander, etc.) ~ Increase fluids to 8-12 eight oz (1.6 to 2.4 liters) glasses per day, half of them to be water. Soups, popsicles, fruit juices, non-caffeinated sodas (unless restricting sodium intake), jello, broths, decaf teas, etc. are all okay. Warm fluids can be soothing. ~ ~ If you can, stop smoking now and avoid all secondhand smoke. ~ Warm fluids may help, or try a mixture of honey and lemon juice in warm tea. ~ HEALTH  PROMOTION / MAINTENANCE ~ SYMPTOM / CONDITION MANAGEMENT ~ INFECTION CONTROL ~ CAUTIONS Coughing up mucus or phlegm helps to get rid of an infection. A productive cough should not be stopped. A cough medicine with guaifenesin (Robitussin, Mucinex) can help loosen the mucus. Cough medicine with dextromethorphan (DM) should be avoided. Drinking lots of fluids can help loosen the mucus too, especially warm fluids. ~ Analgesic/Antipyretic Advice - Acetaminophen: Consider acetaminophen as directed on label or by pharmacist/provider for pain or fever PRECAUTIONS: - Use if there is no history of liver disease, alcoholism, or intake of three or more alcohol drinks per day - Only if approved by provider during pregnancy or when breastfeeding - During pregnancy, acetaminophen should not be taken more than 3 consecutive days without telling provider - Do not exceed recommended dose or frequency ~ ~ Go to the ED if having chest pain with breathing or breathing is becoming more difficult. Call provider if has a fever over 101.5 F (38.6 C) that has not responded to home care measures, having shaking chills or any fever in someone immunocompromised/frail elderly. ~ ~ Analgesic/Antipyretic Advice - NSAIDs: 12/23/2011 3:01:30PM Page 1 of 2 CAN_TriageRpt_V2 [facility] Triage Call Report Patient Name: Janashia Parco continuation page/s Consider aspirin, ibuprofen, naproxen or ketoprofen for pain or fever as directed on label or by pharmacist/provider. PRECAUTIONS: - If over 47 years of age, should not take longer than 1 week without consulting provider. EXCEPTIONS: - Should not be used if taking blood thinners or have bleeding problems. - Do not use if have history of sensitivity/allergy to any of these medications; or history of cardiovascular, ulcer, kidney, liver disease or diabetes unless approved by provider. -R Desop inroat oerxyc Heeydg  riencoe:m mended dose or frequency. - Cover the  nose/mouth tightly with a tissue when coughing or sneezing. - Use tissue 1 time and discard in the nearest waste receptacle. - Wash hands with soap and water or alcohol-based hand rub after coming into contact with respiratory secretions and contaminated objects/materials. - Alternatively when no tissue is available, cough into the bend of the elbow. - .Avoid touching your eyes, nose or mouth. ~ 12/23/2011 3:01:30PM Page 2 of 2 CAN_TriageRpt_V2

## 2011-12-24 ENCOUNTER — Ambulatory Visit (INDEPENDENT_AMBULATORY_CARE_PROVIDER_SITE_OTHER): Payer: 59 | Admitting: Family Medicine

## 2011-12-24 ENCOUNTER — Encounter: Payer: Self-pay | Admitting: Family Medicine

## 2011-12-24 DIAGNOSIS — J069 Acute upper respiratory infection, unspecified: Secondary | ICD-10-CM

## 2011-12-24 MED ORDER — AZITHROMYCIN 250 MG PO TABS: ORAL_TABLET | ORAL | Status: AC

## 2011-12-24 MED ORDER — FLUTICASONE PROPIONATE 50 MCG/ACT NA SUSP: 2.0000 | Freq: Every day | NASAL | Status: DC

## 2011-12-24 MED ORDER — ALBUTEROL SULFATE HFA 108 (90 BASE) MCG/ACT IN AERS: 2.0000 | INHALATION_SPRAY | Freq: Four times a day (QID) | RESPIRATORY_TRACT | Status: DC | PRN

## 2011-12-24 NOTE — Patient Instructions (Signed)
Start the antibiotics, use the inhaler as needed for cough/wheeze and then use the flonase daily.  Take care.  If the nasal symptoms aren't getting better, then we'll likely need to get you to see ENT.

## 2011-12-24 NOTE — Progress Notes (Signed)
Seen in 1/13 and then 2/13, started on augmentin but wasn't getting better but only took it for 5 days.  Off abx 2-3 days.  Had diarrhea on the abx, better now.   Still with cough, wheeze, green sputum, bloody rhinorrhea, voice change, ST, ear pain.  Doesn't have fever, myalgias.    SABA helps the wheeze.    Nonsmoker.    Meds, vitals, and allergies reviewed.   ROS: See HPI.  Otherwise, noncontributory. GEN: nad, alert and oriented HEENT: mucous membranes moist, tm w/o erythema, nasal exam w/o erythema, clear discharge noted,  OP with cobblestoning, max sinus ttp x2 NECK: supple w/o LA CV: rrr.   PULM: ronchi B but no inc wob, scant wheeze EXT: no edema SKIN: no acute rash

## 2011-12-26 NOTE — Assessment & Plan Note (Signed)
Given the exam, start zmax and use flonase in addition to the SABA prn. Nontoxic, f/u prn.  She agrees.

## 2012-01-05 ENCOUNTER — Other Ambulatory Visit: Payer: Self-pay | Admitting: *Deleted

## 2012-01-05 MED ORDER — HYDROCODONE-HOMATROPINE 5-1.5 MG/5ML PO SYRP: ORAL_SOLUTION | ORAL | Status: DC

## 2012-01-05 NOTE — Telephone Encounter (Signed)
Received faxed refill request from pharmacy, called patient and she stated that she still needs cough medicine because she continues to cough for almost two months now.  Please advise.

## 2012-01-05 NOTE — Telephone Encounter (Signed)
Please call in.  If not improved with this round of cough medicine, then she needs f/u.  Thanks.

## 2012-01-06 NOTE — Telephone Encounter (Signed)
Medication phoned to pharmacy.  LMOVM for patient.

## 2012-03-24 ENCOUNTER — Other Ambulatory Visit: Payer: 59

## 2012-03-31 ENCOUNTER — Encounter: Payer: 59 | Admitting: Family Medicine

## 2012-04-27 ENCOUNTER — Ambulatory Visit (INDEPENDENT_AMBULATORY_CARE_PROVIDER_SITE_OTHER)
Admission: RE | Admit: 2012-04-27 | Discharge: 2012-04-27 | Disposition: A | Discharge status: Home / Self Care | Payer: 59 | Source: Ambulatory Visit | Attending: Family Medicine | Admitting: Family Medicine

## 2012-04-27 ENCOUNTER — Ambulatory Visit (INDEPENDENT_AMBULATORY_CARE_PROVIDER_SITE_OTHER): Payer: 59 | Admitting: Family Medicine

## 2012-04-27 ENCOUNTER — Encounter: Payer: Self-pay | Admitting: Family Medicine

## 2012-04-27 VITALS — BP 150/80 | HR 89 | Temp 98.1°F | Ht 65.0 in | Wt 188.0 lb

## 2012-04-27 DIAGNOSIS — R053 Chronic cough: Secondary | ICD-10-CM

## 2012-04-27 DIAGNOSIS — J4530 Mild persistent asthma, uncomplicated: Secondary | ICD-10-CM

## 2012-04-27 DIAGNOSIS — R059 Cough, unspecified: Secondary | ICD-10-CM

## 2012-04-27 DIAGNOSIS — J45901 Unspecified asthma with (acute) exacerbation: Secondary | ICD-10-CM

## 2012-04-27 DIAGNOSIS — R05 Cough: Secondary | ICD-10-CM

## 2012-04-27 MED ORDER — MONTELUKAST SODIUM 10 MG PO TABS: 10.0000 mg | ORAL_TABLET | Freq: Every day | ORAL | Status: DC

## 2012-04-27 MED ORDER — PREDNISONE 20 MG PO TABS: ORAL_TABLET | ORAL | Status: DC

## 2012-04-27 MED ORDER — AZITHROMYCIN 250 MG PO TABS: ORAL_TABLET | ORAL | Status: AC

## 2012-04-27 NOTE — Assessment & Plan Note (Signed)
Treat with prednisone taper, antibiotics. Push fluids.

## 2012-04-27 NOTE — Assessment & Plan Note (Addendum)
Negative chest X-ray. Spirometry showed mild obstruction reversible.

## 2012-04-27 NOTE — Assessment & Plan Note (Signed)
Start singulair. Use albuterol prn. Follow up in 1-2 moths for spirometry recehck. May need Advair.

## 2012-04-27 NOTE — Progress Notes (Signed)
  Subjective:    Patient ID: Amanda Estrada, female    DOB: January 18, 1961, 51 y.o.   MRN: [id number]  HPI  51 year old female seen in 1 and 2 three times for URI.. Treated with amox and Zpack. SABA helped with wheezing she had at that time. Symptoms resolved except dry cough.  Presents today with several months of congestion, productive cough (no blood in mucus)  throat pain, ear pain. No fever. Facial tenderness. Cough keeping her up at night. SOB and wheezing. Albuterol helped some, but had some cough with it.  Peak flow 250.. Low. No past diagnosis of asthma. Nonsmoker.  No second hand smoke.  Review of Systems  Constitutional: Negative for fever and fatigue.  HENT: Negative for ear pain.   Eyes: Negative for pain.  Respiratory: Negative for chest tightness and shortness of breath.   Cardiovascular: Negative for chest pain, palpitations and leg swelling.  Gastrointestinal: Negative for abdominal pain.  Genitourinary: Negative for dysuria.       Objective:   Physical Exam  Constitutional: Vital signs are normal. She appears well-developed and well-nourished. She is cooperative.  Non-toxic appearance. She does not appear ill. No distress.  HENT:  Head: Normocephalic.  Right Ear: Hearing, tympanic membrane, external ear and ear canal normal. Tympanic membrane is not erythematous, not retracted and not bulging.  Left Ear: Hearing, tympanic membrane, external ear and ear canal normal. Tympanic membrane is not erythematous, not retracted and not bulging.  Nose: Mucosal edema and rhinorrhea present. Right sinus exhibits no maxillary sinus tenderness and no frontal sinus tenderness. Left sinus exhibits no maxillary sinus tenderness and no frontal sinus tenderness.  Mouth/Throat: Uvula is midline, oropharynx is clear and moist and mucous membranes are normal.  Eyes: Conjunctivae, EOM and lids are normal. Pupils are equal, round, and reactive to light. No foreign bodies found.  Neck:  Trachea normal and normal range of motion. Neck supple. Carotid bruit is not present. No mass and no thyromegaly present.  Cardiovascular: Normal rate, regular rhythm, S1 normal, S2 normal, normal heart sounds, intact distal pulses and normal pulses.  Exam reveals no gallop and no friction rub.   No murmur heard. Pulmonary/Chest: Effort normal. Not tachypneic. No respiratory distress. She has decreased breath sounds. She has wheezes. She has no rhonchi. She has no rales.  Neurological: She is alert.  Skin: Skin is warm, dry and intact. No rash noted.  Psychiatric: Her speech is normal and behavior is normal. Judgment normal. Her mood appears not anxious. Cognition and memory are normal. She does not exhibit a depressed mood.          Assessment & Plan:

## 2012-04-27 NOTE — Patient Instructions (Signed)
Start dailysingulair. Use albuterol inhaler as needed for rescue. Start course of antibiotics and prednsione.   Follow up in 1 month for recheck spirometry.  Call sooner if not improving.

## 2012-05-16 ENCOUNTER — Ambulatory Visit: Payer: 59 | Admitting: Family Medicine

## 2012-05-29 ENCOUNTER — Other Ambulatory Visit: Payer: Self-pay | Admitting: Family Medicine

## 2012-05-30 ENCOUNTER — Encounter: Payer: Self-pay | Admitting: Family Medicine

## 2012-05-30 ENCOUNTER — Ambulatory Visit (INDEPENDENT_AMBULATORY_CARE_PROVIDER_SITE_OTHER): Payer: 59 | Admitting: Family Medicine

## 2012-05-30 VITALS — BP 130/78 | HR 76 | Temp 97.9°F | Ht 65.0 in | Wt 189.8 lb

## 2012-05-30 DIAGNOSIS — J45909 Unspecified asthma, uncomplicated: Secondary | ICD-10-CM

## 2012-05-30 DIAGNOSIS — Z720 Tobacco use: Secondary | ICD-10-CM

## 2012-05-30 DIAGNOSIS — R05 Cough: Secondary | ICD-10-CM

## 2012-05-30 DIAGNOSIS — J4530 Mild persistent asthma, uncomplicated: Secondary | ICD-10-CM

## 2012-05-30 DIAGNOSIS — R059 Cough, unspecified: Secondary | ICD-10-CM

## 2012-05-30 DIAGNOSIS — J45901 Unspecified asthma with (acute) exacerbation: Secondary | ICD-10-CM

## 2012-05-30 DIAGNOSIS — F17200 Nicotine dependence, unspecified, uncomplicated: Secondary | ICD-10-CM

## 2012-05-30 NOTE — Assessment & Plan Note (Addendum)
Resolved

## 2012-05-30 NOTE — Assessment & Plan Note (Signed)
On singulair.. May continue to improve beyond this given she has only be on singulair for 1 month.

## 2012-05-30 NOTE — Progress Notes (Signed)
  Subjective:    Patient ID: Amanda Estrada, female    DOB: June 20, 1961, 51 y.o.   MRN: [id number]  HPI  51 year old female presents for  35month follow up.   At last OV 04/27/12 COUGH, CHRONIC Negative chest X-ray. Spirometry showed mild obstruction reversible. Mild persistent asthma -  Start singulair. Use albuterol prn. Follow up in 1-2 moths for spirometry recehck. May need Advair. Asthma exacerbation - Treat with prednisone taper, antibiotics. Push fluids.  Today she reports she feels significantly better. Cough is better, but still occasional. No SOB, occ rare wheeze.  She has completed antibiotics and prednisone course..has been off for several weeks.  No CP, no fever.  Her spirometry is normal on singulair. No SE to meds.       Review of Systems  Constitutional: Negative for fever and fatigue.  HENT: Negative for ear pain.   Eyes: Negative for pain.  Respiratory: Negative for chest tightness and shortness of breath.   Cardiovascular: Negative for chest pain, palpitations and leg swelling.  Gastrointestinal: Negative for abdominal pain.  Genitourinary: Negative for dysuria.       Objective:   Physical Exam  Constitutional: Vital signs are normal. She appears well-developed and well-nourished. She is cooperative.  Non-toxic appearance. She does not appear ill. No distress.  HENT:  Head: Normocephalic.  Right Ear: Hearing, tympanic membrane, external ear and ear canal normal. Tympanic membrane is not erythematous, not retracted and not bulging.  Left Ear: Hearing, tympanic membrane, external ear and ear canal normal. Tympanic membrane is not erythematous, not retracted and not bulging.  Nose: No mucosal edema or rhinorrhea. Right sinus exhibits no maxillary sinus tenderness and no frontal sinus tenderness. Left sinus exhibits no maxillary sinus tenderness and no frontal sinus tenderness.  Mouth/Throat: Uvula is midline, oropharynx is clear and moist and mucous  membranes are normal.  Eyes: Conjunctivae, EOM and lids are normal. Pupils are equal, round, and reactive to light. No foreign bodies found.  Neck: Trachea normal and normal range of motion. Neck supple. Carotid bruit is not present. No mass and no thyromegaly present.  Cardiovascular: Normal rate, regular rhythm, S1 normal, S2 normal, normal heart sounds, intact distal pulses and normal pulses.  Exam reveals no gallop and no friction rub.   No murmur heard. Pulmonary/Chest: Effort normal and breath sounds normal. Not tachypneic. No respiratory distress. She has no decreased breath sounds. She has no wheezes. She has no rhonchi. She has no rales.  Abdominal: Soft. Normal appearance and bowel sounds are normal. There is no tenderness.  Neurological: She is alert.  Skin: Skin is warm, dry and intact. No rash noted.  Psychiatric: Her speech is normal and behavior is normal. Judgment and thought content normal. Her mood appears not anxious. Cognition and memory are normal. She does not exhibit a depressed mood.          Assessment & Plan:

## 2012-05-30 NOTE — Patient Instructions (Addendum)
Continue singulair.  Keep follow up for CPX as planned.

## 2012-05-30 NOTE — Assessment & Plan Note (Signed)
50 % improvement per pt.

## 2012-06-21 ENCOUNTER — Other Ambulatory Visit (INDEPENDENT_AMBULATORY_CARE_PROVIDER_SITE_OTHER): Payer: 59

## 2012-06-21 DIAGNOSIS — E782 Mixed hyperlipidemia: Secondary | ICD-10-CM

## 2012-06-21 DIAGNOSIS — I10 Essential (primary) hypertension: Secondary | ICD-10-CM

## 2012-06-21 LAB — COMPREHENSIVE METABOLIC PANEL
ALT: 22 U/L (ref 0–35)
Albumin: 4.6 g/dL (ref 3.5–5.2)
CO2: 25 mEq/L (ref 19–32)
Calcium: 9.9 mg/dL (ref 8.4–10.5)
Chloride: 96 mEq/L (ref 96–112)
GFR: 101.83 mL/min (ref >60.00)
Glucose, Bld: 93 mg/dL (ref 70–99)
Potassium: 4.2 mEq/L (ref 3.5–5.1)
Sodium: 130 mEq/L — ABNORMAL LOW (ref 135–145)
Total Protein: 7.6 g/dL (ref 6.0–8.3)

## 2012-06-21 LAB — CBC WITH DIFFERENTIAL/PLATELET
Basophils Absolute: 0 10*3/uL (ref 0.0–0.1)
Eosinophils Relative: 10 % — ABNORMAL HIGH (ref 0.0–5.0)
HCT: 41.1 % (ref 36.0–46.0)
Lymphocytes Relative: 27.9 % (ref 12.0–46.0)
Lymphs Abs: 1.3 10*3/uL (ref 0.7–4.0)
Monocytes Relative: 8.3 % (ref 3.0–12.0)
Neutrophils Relative %: 52.8 % (ref 43.0–77.0)
Platelets: 247 10*3/uL (ref 150.0–400.0)
WBC: 4.7 10*3/uL (ref 4.5–10.5)

## 2012-06-21 LAB — LIPID PANEL
Total CHOL/HDL Ratio: 2
VLDL: 11 mg/dL (ref 0.0–40.0)

## 2012-06-21 LAB — TSH: TSH: 2.03 u[IU]/mL (ref 0.35–5.50)

## 2012-06-27 ENCOUNTER — Encounter: Payer: 59 | Admitting: Family Medicine

## 2012-07-03 ENCOUNTER — Encounter: Payer: 59 | Admitting: Family Medicine

## 2012-07-12 ENCOUNTER — Telehealth: Payer: Self-pay

## 2012-07-12 NOTE — Telephone Encounter (Signed)
Pt called to make new pt appt for elevated BP. Appt was made for 9/30. She then mentions BP=195/99 yesterday and 203/101 this morning. She says this is associated with "shakiness" and elevated HR. She takes Hyzaar and has been on this med for "years". She says once she takes her meds she "feels better". She is unsure as to what BP is now that she has taken her meds.   I had her hold while I discussed with Dr.Gollan who suggests she f/u with PCP until she can get in with [place] 9/30. She verb. Understanding.

## 2012-07-26 ENCOUNTER — Encounter: Payer: Self-pay | Admitting: Gastroenterology

## 2012-08-02 ENCOUNTER — Other Ambulatory Visit: Payer: Self-pay | Admitting: Family Medicine

## 2012-08-03 ENCOUNTER — Other Ambulatory Visit: Payer: Self-pay

## 2012-08-03 MED ORDER — MONTELUKAST SODIUM 10 MG PO TABS: 10.0000 mg | ORAL_TABLET | Freq: Every day | ORAL | Status: DC

## 2012-08-03 NOTE — Telephone Encounter (Signed)
Rx singular 10 mg #30 3 R called in to [facility]

## 2012-08-03 NOTE — Telephone Encounter (Signed)
Refill request for Singular 10 mg. Ok to refill? 

## 2012-08-03 NOTE — Telephone Encounter (Signed)
Refill request for Singular 10 mg. Ok to refill?

## 2012-08-07 ENCOUNTER — Encounter: Payer: Self-pay | Admitting: Cardiovascular Disease

## 2012-08-07 ENCOUNTER — Ambulatory Visit (INDEPENDENT_AMBULATORY_CARE_PROVIDER_SITE_OTHER): Payer: 59 | Admitting: Cardiovascular Disease

## 2012-08-07 VITALS — BP 190/90 | HR 89 | Ht 66.0 in | Wt 181.5 lb

## 2012-08-07 DIAGNOSIS — I10 Essential (primary) hypertension: Secondary | ICD-10-CM

## 2012-08-07 DIAGNOSIS — E7800 Pure hypercholesterolemia, unspecified: Secondary | ICD-10-CM

## 2012-08-07 DIAGNOSIS — J45901 Unspecified asthma with (acute) exacerbation: Secondary | ICD-10-CM

## 2012-08-07 MED ORDER — AMLODIPINE BESYLATE 10 MG PO TABS: 10.0000 mg | ORAL_TABLET | Freq: Every day | ORAL | Status: DC

## 2012-08-07 MED ORDER — LOSARTAN POTASSIUM-HCTZ 100-25 MG PO TABS: 1.0000 | ORAL_TABLET | Freq: Every day | ORAL | Status: DC

## 2012-08-07 NOTE — Progress Notes (Signed)
Patient ID: Amanda Estrada, female    DOB: [birth year], 51 y.o.   MRN: [id number]  HPI Comments: Amanda Estrada is a very pleasant 51 year old woman with history of hypertension and asthma. She presents for evaluation and medical management.  She reports that periodically she has trouble with her blood pressure. In the past, she has tried several medications including amlodipine and metoprolol. She has had problem with asthma in the past and several blood pressure medications were changed on the assumption her cough was from blood pressure pills. Her asthma symptoms and cough seemed to improve with proton pump inhibitors and taking Singulair.   [place] and [place] not [place] her Singulair and proton pump inhibitor and cough has returned. Prior to this, blood pressure seems to be climbing with systolic pressures typically in the 150 and 160 range.   Prior visits to primary care showed blood pressures typically in the 130 range. She denies any new stressors or other issues.  EKG shows normal sinus rhythm with rate 89 beats per minute with no significant ST or T wave changes   Outpatient Encounter Prescriptions as of 08/07/2012  Medication Sig Dispense Refill  . cetirizine (ZYRTEC) 10 MG tablet Take 10 mg by mouth daily.      Marland Kitchen HYZAAR 100-12.5 MG per tablet TAKE 1 TABLET BY MOUTH EVERY MORNING  30 tablet  10  . montelukast (SINGULAIR) 10 MG tablet Take 1 tablet (10 mg total) by mouth at bedtime.  30 tablet  3  . omeprazole (PRILOSEC) 40 MG capsule Take 40 mg by mouth daily.      . sertraline (ZOLOFT) 50 MG tablet TAKE 1/2 TABLET BY MOUTH EACH MORNING  45 tablet  1     Review of Systems  Constitutional: Negative.   HENT: Negative.   Eyes: Negative.   Respiratory: Negative.   Cardiovascular: Negative.   Gastrointestinal: Negative.   Musculoskeletal: Negative.   Skin: Negative.   Neurological: Negative.   Hematological: Negative.   Psychiatric/Behavioral:  Negative.   All other systems reviewed and are negative.    BP 190/90  Pulse 89  Ht 5\' 6"  (1.676 m)  Wt 181 lb 8 oz (82.328 kg)  BMI 29.29 kg/m2 Repeat blood pressure is high 170 systolic over 90  Physical Exam  Nursing note and vitals reviewed. Constitutional: She is oriented to person, place, and time. She appears well-developed and well-nourished.  HENT:  Head: Normocephalic.  Nose: Nose normal.  Mouth/Throat: Oropharynx is clear and moist.  Eyes: Conjunctivae normal are normal. Pupils are equal, round, and reactive to light.  Neck: Normal range of motion. Neck supple. No JVD present.  Cardiovascular: Normal rate, regular rhythm, S1 normal, S2 normal, normal heart sounds and intact distal pulses.  Exam reveals no gallop and no friction rub.   No murmur heard. Pulmonary/Chest: Effort normal and breath sounds normal. No respiratory distress. She has no wheezes. She has no rales. She exhibits no tenderness.  Abdominal: Soft. Bowel sounds are normal. She exhibits no distension. There is no tenderness.  Musculoskeletal: Normal range of motion. She exhibits no edema and no tenderness.  Lymphadenopathy:    She has no cervical adenopathy.  Neurological: She is alert and oriented to person, place, and time. Coordination normal.  Skin: Skin is warm and dry. No rash noted. No erythema.  Psychiatric: She has a normal mood and affect. Her behavior is normal. Judgment and thought content normal.  Assessment and Plan

## 2012-08-07 NOTE — Patient Instructions (Addendum)
Please start amlodipine 5 mg a day Also increase the Hyzaar to 100/25 mg daily Monitor your blood pressure Goal blood pressure on the top number is <140 If  Blood pressure is still running high in one week, increase the amlodipine to a full pill (10 mg once a day)  Please call us if you have new issues that need to be addressed before your next appt.  Your physician wants you to follow-up in: 1 months.

## 2012-08-07 NOTE — Assessment & Plan Note (Signed)
We have suggested she continue to work on her diet and exercise

## 2012-08-07 NOTE — Assessment & Plan Note (Signed)
We have recommended she stay on her proton pump inhibitor and Singulair.

## 2012-08-07 NOTE — Assessment & Plan Note (Signed)
Blood pressure is elevated on today's visit. She appears very calm, denies any significant stress. Even on recheck, it stayed elevated. Evaluation of the previous notes from 2009 showed amlodipine was stopped secondary to cough when in fact this was likely from her underlying asthma that improved with Singulair and proton pump inhibitor. We will retry amlodipine 5 mg titrating up to 10 mg. We will also increase her Hyzaar to 100/25 mg daily. She reports taking brand name only. We did mention we could try Diovan HCT generic in the future once her blood pressure is well controlled.

## 2012-08-15 ENCOUNTER — Telehealth: Payer: Self-pay

## 2012-08-15 ENCOUNTER — Encounter: Payer: Self-pay | Admitting: Cardiovascular Disease

## 2012-08-15 NOTE — Telephone Encounter (Signed)
error 

## 2012-08-17 MED ORDER — AMLODIPINE BESYLATE 5 MG PO TABS: 5.0000 mg | ORAL_TABLET | Freq: Two times a day (BID) | ORAL | Status: DC

## 2012-08-21 ENCOUNTER — Telehealth: Payer: Self-pay

## 2012-08-21 NOTE — Telephone Encounter (Signed)
error 

## 2012-08-22 ENCOUNTER — Telehealth: Payer: Self-pay

## 2012-08-24 NOTE — Telephone Encounter (Signed)
error 

## 2012-08-30 ENCOUNTER — Encounter: Payer: Self-pay | Admitting: Family Medicine

## 2012-08-31 MED ORDER — MONTELUKAST SODIUM 10 MG PO TABS: 10.0000 mg | ORAL_TABLET | Freq: Every day | ORAL | Status: DC

## 2012-08-31 NOTE — Telephone Encounter (Signed)
Prescription sent

## 2012-09-08 ENCOUNTER — Other Ambulatory Visit (HOSPITAL_COMMUNITY)
Admission: RE | Admit: 2012-09-08 | Discharge: 2012-09-08 | Disposition: A | Discharge status: Home / Self Care | Payer: 59 | Source: Ambulatory Visit | Attending: Family Medicine | Admitting: Family Medicine

## 2012-09-08 ENCOUNTER — Ambulatory Visit (INDEPENDENT_AMBULATORY_CARE_PROVIDER_SITE_OTHER): Payer: 59 | Admitting: Family Medicine

## 2012-09-08 ENCOUNTER — Encounter: Payer: Self-pay | Admitting: Family Medicine

## 2012-09-08 VITALS — BP 160/78 | HR 86 | Temp 97.5°F | Resp 16 | Ht 65.0 in | Wt 179.8 lb

## 2012-09-08 DIAGNOSIS — F411 Generalized anxiety disorder: Secondary | ICD-10-CM

## 2012-09-08 DIAGNOSIS — R05 Cough: Secondary | ICD-10-CM

## 2012-09-08 DIAGNOSIS — I10 Essential (primary) hypertension: Secondary | ICD-10-CM

## 2012-09-08 DIAGNOSIS — Z1231 Encounter for screening mammogram for malignant neoplasm of breast: Secondary | ICD-10-CM

## 2012-09-08 DIAGNOSIS — Z01419 Encounter for gynecological examination (general) (routine) without abnormal findings: Secondary | ICD-10-CM | POA: Insufficient documentation

## 2012-09-08 DIAGNOSIS — K219 Gastro-esophageal reflux disease without esophagitis: Secondary | ICD-10-CM

## 2012-09-08 DIAGNOSIS — R059 Cough, unspecified: Secondary | ICD-10-CM

## 2012-09-08 DIAGNOSIS — Z1151 Encounter for screening for human papillomavirus (HPV): Secondary | ICD-10-CM | POA: Insufficient documentation

## 2012-09-08 DIAGNOSIS — Z0000 Encounter for general adult medical examination without abnormal findings: Secondary | ICD-10-CM

## 2012-09-08 NOTE — Addendum Note (Signed)
Addended by: Jackson Latino on: 09/08/2012 01:01 PM   Modules accepted: Orders

## 2012-09-08 NOTE — Assessment & Plan Note (Signed)
Unable to tolerate amlodipine. HCTZ likely cause of low sodium.  On max ARB. HAs appt for further recommendation with Dr. Mariah Milling in 3 days. Encouraged exercise, weight loss, healthy eating habits.

## 2012-09-08 NOTE — Progress Notes (Signed)
Subjective:    Patient ID: Amanda Estrada, female    DOB: 06/23/1961, 51 y.o.   MRN: [id number]  HPI  The patient is here for annual wellness exam and preventative care.   She also has the following chronic health issues:   Chronic cough, asthma, mild presistant.Marland KitchenMarland KitchenImproved with singulair   GERD: Improved on pantoprazole.   HTN, fluctuating control... occ episodes of flushing and high BP. Last year urinary catecholamines neg. Increase dose of HCTZ started by Dr. Mariah Milling.  (Could not tolerate amlodipine) Has follow up in 3 days with him for BP re-eval. Brings in measurements  Using medication without problems or lightheadedness: None Chest pain with exertion:None Edema:None Short of breath:None Average home BPs:160s systolic Other issues:  Depression,anxiety: stable on low dose zoloft  Review of Systems  Constitutional: Negative for fever and fatigue.  HENT: Negative for ear pain.   Eyes: Negative for pain.  Respiratory: Negative for chest tightness and shortness of breath.   Cardiovascular: Negative for chest pain, palpitations and leg swelling.  Gastrointestinal: Negative for abdominal pain.  Genitourinary: Negative for dysuria.       Objective:   Physical Exam  Constitutional: Vital signs are normal. She appears well-developed and well-nourished. She is cooperative.  Non-toxic appearance. She does not appear ill. No distress.  HENT:  Head: Normocephalic.  Right Ear: Hearing, tympanic membrane, external ear and ear canal normal.  Left Ear: Hearing, tympanic membrane, external ear and ear canal normal.  Nose: Nose normal.  Eyes: Conjunctivae normal, EOM and lids are normal. Pupils are equal, round, and reactive to light. No foreign bodies found.  Neck: Trachea normal and normal range of motion. Neck supple. Carotid bruit is not present. No mass and no thyromegaly present.  Cardiovascular: Normal rate, regular rhythm, S1 normal, S2 normal, normal heart sounds and  intact distal pulses.  Exam reveals no gallop.   No murmur heard. Pulmonary/Chest: Effort normal and breath sounds normal. No respiratory distress. She has no wheezes. She has no rhonchi. She has no rales.  Abdominal: Soft. Normal appearance and bowel sounds are normal. She exhibits no distension, no fluid wave, no abdominal bruit and no mass. There is no hepatosplenomegaly. There is no tenderness. There is no rebound, no guarding and no CVA tenderness. No hernia.  Genitourinary: Vagina normal and uterus normal. No breast swelling, tenderness, discharge or bleeding. Pelvic exam was performed with patient prone. There is no rash, tenderness or lesion on the right labia. There is no rash, tenderness or lesion on the left labia. Uterus is not enlarged and not tender. Cervix exhibits no motion tenderness, no discharge and no friability. Right adnexum displays no mass, no tenderness and no fullness. Left adnexum displays no mass, no tenderness and no fullness.  Lymphadenopathy:    She has no cervical adenopathy.    She has no axillary adenopathy.  Neurological: She is alert. She has normal strength. No cranial nerve deficit or sensory deficit.  Skin: Skin is warm, dry and intact. No rash noted.  Psychiatric: Her speech is normal and behavior is normal. Judgment normal. Her mood appears not anxious. Cognition and memory are normal. She does not exhibit a depressed mood.          Assessment & Plan:  The patient's preventative maintenance and recommended screening tests for an annual wellness exam were reviewed in full today. Brought up to date unless services declined.  Counselled on the importance of diet, exercise, and its role in overall health and mortality.  The patient's FH and SH was reviewed, including their home life, tobacco status, and drug and alcohol status.   Vaccines:Td uptodate, getting flu at work [employer] Mammo: Will schedule PAP/DVE: ON q 3 year schedule for pap, last nml 2010,  due this year. DVE yearly Colon: 2007 nml, Dr. Arlyce Dice parents both with colon cancer, due t n2/2102

## 2012-09-08 NOTE — Assessment & Plan Note (Signed)
Stable on zoloft. Discussed trying to wean off if able.

## 2012-09-08 NOTE — Patient Instructions (Addendum)
Plan talking to Dr. Mariah Milling about poor control of BP... Given low sodium, consider changing to losartan without HCTZ (higher dose not helping anyway) and add a new med. You can try to wean of zoloft very slowly over several months.  Call [facility] for repeat colonoscopy given family history. Work on regular exercise and weight loss and heart healthy diet.

## 2012-09-08 NOTE — Assessment & Plan Note (Signed)
Resolved with singulair. 

## 2012-09-08 NOTE — Assessment & Plan Note (Signed)
stable °

## 2012-09-11 ENCOUNTER — Ambulatory Visit (INDEPENDENT_AMBULATORY_CARE_PROVIDER_SITE_OTHER): Payer: 59 | Admitting: Cardiovascular Disease

## 2012-09-11 ENCOUNTER — Encounter: Payer: Self-pay | Admitting: Cardiovascular Disease

## 2012-09-11 VITALS — BP 162/64 | HR 76 | Ht 65.0 in | Wt 181.2 lb

## 2012-09-11 DIAGNOSIS — E871 Hypo-osmolality and hyponatremia: Secondary | ICD-10-CM

## 2012-09-11 DIAGNOSIS — E7800 Pure hypercholesterolemia, unspecified: Secondary | ICD-10-CM

## 2012-09-11 DIAGNOSIS — I10 Essential (primary) hypertension: Secondary | ICD-10-CM

## 2012-09-11 MED ORDER — CLONIDINE HCL 0.1 MG PO TABS: 0.1000 mg | ORAL_TABLET | Freq: Two times a day (BID) | ORAL | Status: DC

## 2012-09-11 NOTE — Patient Instructions (Addendum)
You are doing well. Please start clonidine twice a day (Am and PM)  Try RED YEAST RICE for cholesterol  Please call us if you have new issues that need to be addressed before your next appt.  Your physician wants you to follow-up in: 2 months.  You will receive a reminder letter in the mail two months in advance. If you don't receive a letter, please call our office to schedule the follow-up appointment.

## 2012-09-11 NOTE — Progress Notes (Signed)
   Patient ID: Amanda Estrada, female    DOB: January 22, 1961, 51 y.o.   MRN: [id number]  HPI Comments: Ms. Finnen is a very pleasant 51 year old woman with history of hypertension and asthma. She presents for routine followup.  On her last clinic visit, we tried amlodipine for blood pressure. She reports having worsening cough again. She stopped the medication and her cough improved. Systolic pressures were running in the 160 range on Hyzaar. She has been taking medication for asthma  She denies any new stressors or other issues.    Outpatient Encounter Prescriptions as of 09/11/2012  Medication Sig Dispense Refill  . cetirizine (ZYRTEC) 10 MG tablet Take 10 mg by mouth daily.      Marland Kitchen losartan-hydrochlorothiazide (HYZAAR) 100-25 MG per tablet Take 1 tablet by mouth daily.  90 tablet  3  . montelukast (SINGULAIR) 10 MG tablet Take 1 tablet (10 mg total) by mouth at bedtime.  90 tablet  3  . omeprazole (PRILOSEC) 40 MG capsule Take 40 mg by mouth daily.      . sertraline (ZOLOFT) 50 MG tablet TAKE 1/2 TABLET BY MOUTH EACH MORNING  45 tablet  1  . cloNIDine (CATAPRES) 0.1 MG tablet Take 1 tablet (0.1 mg total) by mouth 2 (two) times daily.  60 tablet  11    Review of Systems  Constitutional: Negative.   HENT: Negative.   Eyes: Negative.   Respiratory: Negative.   Cardiovascular: Negative.   Gastrointestinal: Negative.   Musculoskeletal: Negative.   Skin: Negative.   Neurological: Negative.   Hematological: Negative.   Psychiatric/Behavioral: Negative.   All other systems reviewed and are negative.    BP 162/64  Pulse 76  Ht 5\' 5"  (1.651 m)  Wt 181 lb 4 oz (82.214 kg)  BMI 30.16 kg/m2  Physical Exam  Nursing note and vitals reviewed. Constitutional: She is oriented to person, place, and time. She appears well-developed and well-nourished.  HENT:  Head: Normocephalic.  Nose: Nose normal.  Mouth/Throat: Oropharynx is clear and moist.  Eyes: Conjunctivae normal are normal.  Pupils are equal, round, and reactive to light.  Neck: Normal range of motion. Neck supple. No JVD present.  Cardiovascular: Normal rate, regular rhythm, S1 normal, S2 normal, normal heart sounds and intact distal pulses.  Exam reveals no gallop and no friction rub.   No murmur heard. Pulmonary/Chest: Effort normal and breath sounds normal. No respiratory distress. She has no wheezes. She has no rales. She exhibits no tenderness.  Abdominal: Soft. Bowel sounds are normal. She exhibits no distension. There is no tenderness.  Musculoskeletal: Normal range of motion. She exhibits no edema and no tenderness.  Lymphadenopathy:    She has no cervical adenopathy.  Neurological: She is alert and oriented to person, place, and time. Coordination normal.  Skin: Skin is warm and dry. No rash noted. No erythema.  Psychiatric: She has a normal mood and affect. Her behavior is normal. Judgment and thought content normal.         Assessment and Plan

## 2012-09-11 NOTE — Assessment & Plan Note (Signed)
We'll check a basic metabolic panel today to confirm no decrease in her sodium level. We will start clonidine 0.1 mg twice a day. If she has side effects with clonidine, other options include hydralazine, isosorbide, among others.

## 2012-09-11 NOTE — Assessment & Plan Note (Signed)
She will work on diet and exercise, consider red yeast rice.

## 2012-09-12 LAB — BASIC METABOLIC PANEL
BUN/Creatinine Ratio: 17 (ref 9–23)
Calcium: 9.8 mg/dL (ref 8.7–10.2)
GFR calc non Af Amer: 103 mL/min/{1.73_m2} (ref >59)
Potassium: 4.4 mmol/L (ref 3.5–5.2)
Sodium: 132 mmol/L — ABNORMAL LOW (ref 134–144)

## 2012-09-14 ENCOUNTER — Encounter: Payer: Self-pay | Admitting: *Deleted

## 2012-11-13 ENCOUNTER — Encounter: Payer: Self-pay | Admitting: Cardiovascular Disease

## 2012-11-13 ENCOUNTER — Ambulatory Visit (INDEPENDENT_AMBULATORY_CARE_PROVIDER_SITE_OTHER): Payer: 59 | Admitting: Cardiovascular Disease

## 2012-11-13 VITALS — BP 132/78 | HR 73 | Ht 66.0 in | Wt 191.2 lb

## 2012-11-13 DIAGNOSIS — I10 Essential (primary) hypertension: Secondary | ICD-10-CM

## 2012-11-13 DIAGNOSIS — E7800 Pure hypercholesterolemia, unspecified: Secondary | ICD-10-CM

## 2012-11-13 MED ORDER — CLONIDINE HCL 0.1 MG PO TABS: 0.1000 mg | ORAL_TABLET | Freq: Two times a day (BID) | ORAL | Status: DC

## 2012-11-13 MED ORDER — LOSARTAN POTASSIUM-HCTZ 100-25 MG PO TABS: 1.0000 | ORAL_TABLET | Freq: Every day | ORAL | Status: DC

## 2012-11-13 NOTE — Progress Notes (Signed)
   Patient ID: Amanda Estrada, female    DOB: 1960/12/08, 52 y.o.   MRN: [id number]  HPI Comments: [name] is a very pleasant 52 year old woman with history of hypertension and asthma. She presents for routine followup.  We have tried amlodipine in the past but this caused a chronic cough. She has had chronic cough with several medications. We started clonidine and Hyzaar. She has been taking clonidine 0.1 mg in the evening and Hyzaar in the morning. On this regimen, her blood pressure has been relatively well controlled with systolic pressures in the 120-130 range.   She has been taking medication for asthma.  She reports her father had bypass surgery in his 15s, mother alive and well in her 30s  She denies any new stressors or other issues. EKG shows normal sinus rhythm with rate 73 beats per minute with poor R-wave progression through the anterior precordial leads    Outpatient Encounter Prescriptions as of 11/13/2012  Medication Sig Dispense Refill  . cetirizine (ZYRTEC) 10 MG tablet Take 10 mg by mouth daily.      . cloNIDine (CATAPRES) 0.1 MG tablet Take 0.1 mg by mouth daily.      Marland Kitchen losartan-hydrochlorothiazide (HYZAAR) 100-25 MG per tablet Take 1 tablet by mouth daily.  90 tablet  3  . montelukast (SINGULAIR) 10 MG tablet Take 1 tablet (10 mg total) by mouth at bedtime.  90 tablet  3  . omeprazole (PRILOSEC) 40 MG capsule Take 40 mg by mouth daily.      . sertraline (ZOLOFT) 50 MG tablet TAKE 1/2 TABLET BY MOUTH EACH MORNING  45 tablet  1     Review of Systems  Constitutional: Negative.   HENT: Negative.   Eyes: Negative.   Respiratory: Negative.   Cardiovascular: Negative.   Gastrointestinal: Negative.   Musculoskeletal: Negative.   Skin: Negative.   Neurological: Negative.   Hematological: Negative.   Psychiatric/Behavioral: Negative.   All other systems reviewed and are negative.    BP 132/78  Pulse 73  Ht 5\' 6"  (1.676 m)  Wt 191 lb 4 oz (86.75 kg)  BMI  30.87 kg/m2  Physical Exam  Nursing note and vitals reviewed. Constitutional: She is oriented to person, place, and time. She appears well-developed and well-nourished.  HENT:  Head: Normocephalic.  Nose: Nose normal.  Mouth/Throat: Oropharynx is clear and moist.  Eyes: Conjunctivae normal are normal. Pupils are equal, round, and reactive to light.  Neck: Normal range of motion. Neck supple. No JVD present.  Cardiovascular: Normal rate, regular rhythm, S1 normal, S2 normal, normal heart sounds and intact distal pulses.  Exam reveals no gallop and no friction rub.   No murmur heard. Pulmonary/Chest: Effort normal and breath sounds normal. No respiratory distress. She has no wheezes. She has no rales. She exhibits no tenderness.  Abdominal: Soft. Bowel sounds are normal. She exhibits no distension. There is no tenderness.  Musculoskeletal: Normal range of motion. She exhibits no edema and no tenderness.  Lymphadenopathy:    She has no cervical adenopathy.  Neurological: She is alert and oriented to person, place, and time. Coordination normal.  Skin: Skin is warm and dry. No rash noted. No erythema.  Psychiatric: She has a normal mood and affect. Her behavior is normal. Judgment and thought content normal.         Assessment and Plan

## 2012-11-13 NOTE — Assessment & Plan Note (Signed)
Cholesterol 190. We have encouraged weight loss and red yeast rice. She does not want a statin at this time.

## 2012-11-13 NOTE — Patient Instructions (Addendum)
You are doing well. No medication changes were made.  Consider Red Yeast Rice for cholesterol  Please call us if you have new issues that need to be addressed before your next appt.  Your physician wants you to follow-up in: 12 months.  You will receive a reminder letter in the mail two months in advance. If you don't receive a letter, please call our office to schedule the follow-up appointment.   

## 2012-11-13 NOTE — Assessment & Plan Note (Signed)
Blood pressure is well controlled on today's visit. No changes made to the medications. 

## 2012-11-20 ENCOUNTER — Encounter: Payer: Self-pay | Admitting: Gastroenterology

## 2012-11-24 ENCOUNTER — Encounter: Payer: Self-pay | Admitting: Family Medicine

## 2012-11-27 ENCOUNTER — Encounter: Payer: Self-pay | Admitting: *Deleted

## 2012-11-27 ENCOUNTER — Other Ambulatory Visit: Payer: Self-pay | Admitting: Family Medicine

## 2012-11-30 ENCOUNTER — Encounter: Payer: Self-pay | Admitting: Family Medicine

## 2012-12-07 NOTE — Telephone Encounter (Signed)
Marion or Heather: can you look into this. Per the mammo I see on 1/16.. They said it was normal with rec in 1 year. They did comment that there was heterogenously dense tissue, but no specific recommendations.  Please call [facility] to ask them if they do recommend [place] or MRI in this patient... I also need to know how to order.

## 2012-12-08 ENCOUNTER — Telehealth: Payer: Self-pay | Admitting: Family Medicine

## 2012-12-08 DIAGNOSIS — Z9189 Other specified personal risk factors, not elsewhere classified: Secondary | ICD-10-CM

## 2012-12-08 NOTE — Telephone Encounter (Signed)
Spoke in detail with pt about her cat 1 mammogram and her heterogeneously dense breasts  ( minimal increase in breast cancer risk) She has a mother with ovarian cancer and has had hyperplasia on previous breast biopsy. ( menses age 52, first chaild age 45, no breast cancer in family, no LCIS, age 15)  Her breast cancer risk is 20.6% lifestime risk.   Given she has >20% risk of breast cancer.. Will likely cover breast MRI.   Marion: please call this patient's insurance to determine if they will cover. I will put in an order.

## 2012-12-12 ENCOUNTER — Encounter: Payer: Self-pay | Admitting: Family Medicine

## 2012-12-18 ENCOUNTER — Encounter: Payer: Self-pay | Admitting: Gastroenterology

## 2012-12-18 ENCOUNTER — Ambulatory Visit (AMBULATORY_SURGERY_CENTER): Payer: 59 | Admitting: *Deleted

## 2012-12-18 VITALS — Ht 66.0 in | Wt 193.4 lb

## 2012-12-18 DIAGNOSIS — Z1211 Encounter for screening for malignant neoplasm of colon: Secondary | ICD-10-CM

## 2012-12-18 DIAGNOSIS — Z800 Family history of malignant neoplasm of digestive organs: Secondary | ICD-10-CM

## 2012-12-18 MED ORDER — NA SULFATE-K SULFATE-MG SULF 17.5-3.13-1.6 GM/177ML PO SOLN: ORAL | Status: DC

## 2012-12-19 ENCOUNTER — Encounter: Payer: Self-pay | Admitting: Family Medicine

## 2012-12-19 ENCOUNTER — Ambulatory Visit
Admission: RE | Admit: 2012-12-19 | Discharge: 2012-12-19 | Disposition: A | Discharge status: Home / Self Care | Payer: 59 | Source: Ambulatory Visit | Attending: Family Medicine | Admitting: Family Medicine

## 2012-12-19 DIAGNOSIS — Z9189 Other specified personal risk factors, not elsewhere classified: Secondary | ICD-10-CM

## 2012-12-19 MED ORDER — GADOBENATE DIMEGLUMINE 529 MG/ML IV SOLN
17.0000 mL | Freq: Once | INTRAVENOUS | Status: AC | PRN
  Administered 2012-12-19: 17 mL via INTRAVENOUS

## 2012-12-19 NOTE — Telephone Encounter (Signed)
Yes.. Okay to refill # 1, 3 RF

## 2012-12-19 NOTE — Telephone Encounter (Signed)
This is not on medication list is it okay to refill?

## 2012-12-20 MED ORDER — ALBUTEROL SULFATE HFA 108 (90 BASE) MCG/ACT IN AERS: 2.0000 | INHALATION_SPRAY | Freq: Four times a day (QID) | RESPIRATORY_TRACT | Status: DC | PRN

## 2012-12-20 NOTE — Telephone Encounter (Signed)
done

## 2012-12-22 ENCOUNTER — Encounter: Payer: Self-pay | Admitting: Cardiovascular Disease

## 2012-12-22 ENCOUNTER — Encounter: Payer: Self-pay | Admitting: Family Medicine

## 2012-12-23 ENCOUNTER — Other Ambulatory Visit: Payer: Self-pay

## 2012-12-25 ENCOUNTER — Other Ambulatory Visit: Payer: Self-pay

## 2012-12-25 MED ORDER — CLONIDINE HCL 0.1 MG PO TABS: ORAL_TABLET | ORAL | Status: DC

## 2013-01-02 ENCOUNTER — Encounter: Payer: Self-pay | Admitting: Gastroenterology

## 2013-01-02 ENCOUNTER — Ambulatory Visit (AMBULATORY_SURGERY_CENTER): Payer: 59 | Admitting: Gastroenterology

## 2013-01-02 VITALS — BP 146/74 | HR 71 | Temp 98.6°F | Resp 27 | Ht 66.0 in | Wt 193.0 lb

## 2013-01-02 DIAGNOSIS — Z1211 Encounter for screening for malignant neoplasm of colon: Secondary | ICD-10-CM

## 2013-01-02 DIAGNOSIS — Z800 Family history of malignant neoplasm of digestive organs: Secondary | ICD-10-CM

## 2013-01-02 MED ORDER — SODIUM CHLORIDE 0.9 % IV SOLN: 500.0000 mL | INTRAVENOUS | Status: DC

## 2013-01-02 NOTE — Patient Instructions (Addendum)
YOU HAD AN ENDOSCOPIC PROCEDURE TODAY AT THE [facility]: Refer to the procedure report that was given to you for any specific questions about what was found during the examination.  If the procedure report does not answer your questions, please call your gastroenterologist to clarify.  If you requested that your care partner not be given the details of your procedure findings, then the procedure report has been included in a sealed envelope for you to review at your convenience later.  YOU SHOULD EXPECT: Some feelings of bloating in the abdomen. Passage of more gas than usual.  Walking can help get rid of the air that was put into your GI tract during the procedure and reduce the bloating. If you had a lower endoscopy (such as a colonoscopy or flexible sigmoidoscopy) you may notice spotting of blood in your stool or on the toilet paper. If you underwent a bowel prep for your procedure, then you may not have a normal bowel movement for a few days.  DIET: Your first meal following the procedure should be a light meal and then it is ok to progress to your normal diet.  A half-sandwich or bowl of soup is an example of a good first meal.  Heavy or fried foods are harder to digest and may make you feel nauseous or bloated.  Likewise meals heavy in dairy and vegetables can cause extra gas to form and this can also increase the bloating.  Drink plenty of fluids but you should avoid alcoholic beverages for 24 hours.  ACTIVITY: Your care partner should take you home directly after the procedure.  You should plan to take it easy, moving slowly for the rest of the day.  You can resume normal activity the day after the procedure however you should NOT DRIVE or use heavy machinery for 24 hours (because of the sedation medicines used during the test).    SYMPTOMS TO REPORT IMMEDIATELY: A gastroenterologist can be reached at any hour.  During normal business hours, 8:30 AM to 5:00 PM Monday through Friday,  call [phone].  After hours and on weekends, please call the GI answering service at [phone] who will take a message and have the physician on call contact you.   Following lower endoscopy (colonoscopy or flexible sigmoidoscopy):  Excessive amounts of blood in the stool  Significant tenderness or worsening of abdominal pains  Swelling of the abdomen that is new, acute  Fever of 100F or higher  FOLLOW UP: If any biopsies were taken you will be contacted by phone or by letter within the next 1-3 weeks.  Call your gastroenterologist if you have not heard about the biopsies in 3 weeks.  Our staff will call the home number listed on your records the next business day following your procedure to check on you and address any questions or concerns that you may have at that time regarding the information given to you following your procedure. This is a courtesy call and so if there is no answer at the home number and we have not heard from you through the emergency physician on call, we will assume that you have returned to your regular daily activities without incident.  Thank-you for choosing [place] for your healthcare needs.  SIGNATURES/CONFIDENTIALITY: You and/or your care partner have signed paperwork which will be entered into your electronic medical record.  These signatures attest to the fact that that the information above on your After Visit Summary has been reviewed and is  understood.  Full responsibility of the confidentiality of this discharge information lies with you and/or your care-partner. 

## 2013-01-02 NOTE — Progress Notes (Addendum)
Patient did not have preoperative order for IV antibiotic SSI prophylaxis. (G8918)  Patient did not experience any of the following events: a burn prior to discharge; a fall within the facility; wrong site/side/patient/procedure/implant event; or a hospital transfer or hospital admission upon discharge from the facility. (G8907)  

## 2013-01-02 NOTE — Progress Notes (Signed)
Lidocaine-40mg IV prior to Propofol InductionPropofol given over incremental dosages 

## 2013-01-02 NOTE — Op Note (Signed)
[facility] [address] [place], [address]   COLONOSCOPY PROCEDURE REPORT  PATIENT: Amanda Estrada, Amanda Estrada  MR#: [id number] BIRTHDATE: 03/28/1961 , 51  yrs. old GENDER: Female ENDOSCOPIST: Louis Meckel, MD REFERRED BY: PROCEDURE DATE:  01/02/2013 PROCEDURE:   Colonoscopy, diagnostic ASA CLASS:   Class II INDICATIONS:Patient's immediate family history of colon cancer. MEDICATIONS: MAC sedation, administered by CRNA and propofol (Diprivan) 200mg  IV  DESCRIPTION OF PROCEDURE:   After the risks benefits and alternatives of the procedure were thoroughly explained, informed consent was obtained.  A digital rectal exam revealed no abnormalities of the rectum.   The LB [id number]  endoscope was introduced through the anus and advanced to the cecum, which was identified by both the appendix and ileocecal valve. No adverse events experienced.   The quality of the prep was Suprep excellent The instrument was then slowly withdrawn as the colon was fully examined.      COLON FINDINGS: A normal appearing cecum, ileocecal valve, and appendiceal orifice were identified.  The ascending, hepatic flexure, transverse, splenic flexure, descending, sigmoid colon and rectum appeared unremarkable.  No polyps or cancers were seen. Retroflexed views revealed no abnormalities. The time to cecum=2 minutes 28 seconds.  Withdrawal time=6 minutes 48 seconds.  The scope was withdrawn and the procedure completed. COMPLICATIONS: There were no complications.  ENDOSCOPIC IMPRESSION: Normal colon  RECOMMENDATIONS: Given your significant family history of colon cancer, you should have a repeat colonoscopy in 5 years   eSigned:  Louis Meckel, MD 01/02/2013 10:04 AM   cc: Excell Seltzer, MD and Bishop Limbo MD

## 2013-01-03 ENCOUNTER — Telehealth: Payer: Self-pay

## 2013-01-03 NOTE — Telephone Encounter (Signed)
Left message on answering machine. 

## 2013-07-02 ENCOUNTER — Other Ambulatory Visit: Payer: Self-pay | Admitting: Family Medicine

## 2013-07-03 NOTE — Telephone Encounter (Signed)
Received refill request electronically. Medication is not on med sheet. Last office visit 09/08/12. Is it okay to refill medication?

## 2013-08-03 ENCOUNTER — Other Ambulatory Visit: Payer: Self-pay | Admitting: Family Medicine

## 2013-08-15 ENCOUNTER — Encounter: Payer: Self-pay | Admitting: *Deleted

## 2013-08-17 ENCOUNTER — Ambulatory Visit (INDEPENDENT_AMBULATORY_CARE_PROVIDER_SITE_OTHER): Payer: 59 | Admitting: Podiatry

## 2013-08-17 ENCOUNTER — Encounter: Payer: Self-pay | Admitting: Podiatry

## 2013-08-17 VITALS — BP 141/79 | HR 76 | Resp 16 | Ht 66.0 in | Wt 199.0 lb

## 2013-08-17 DIAGNOSIS — M779 Enthesopathy, unspecified: Secondary | ICD-10-CM

## 2013-08-18 NOTE — Progress Notes (Signed)
Subjective:     Patient ID: Amanda Estrada, female   DOB: 05-28-1961, 52 y.o.   MRN: [id number]  HPI patient states that her foot is feeling much better. She still gets pain towards the end of the day but he continues to improve left foot   Review of Systems  All other systems reviewed and are negative.       Objective:   Physical Exam  Nursing note and vitals reviewed. Constitutional: She appears well-developed.  Musculoskeletal: Normal range of motion.  Neurological: She is alert.  Skin: Skin is warm.   forefoot left reveals mild swelling. The pain has reduced by about 80% upon palpation.     Assessment:     Capsulitis left which is improving quite a bit.    Plan:     Education about stiff bottomed shoes and avoiding heels was given to patient. Metatarsal pads to keep pressure off the joint were dispensed. If symptoms were to persist patient will be seen back for reevaluation

## 2013-09-13 ENCOUNTER — Other Ambulatory Visit: Payer: Self-pay

## 2013-11-20 ENCOUNTER — Other Ambulatory Visit: Payer: Self-pay | Admitting: Family Medicine

## 2013-11-23 ENCOUNTER — Other Ambulatory Visit: Payer: Self-pay | Admitting: Cardiovascular Disease

## 2013-12-17 ENCOUNTER — Ambulatory Visit (INDEPENDENT_AMBULATORY_CARE_PROVIDER_SITE_OTHER): Payer: 59 | Admitting: Cardiovascular Disease

## 2013-12-17 ENCOUNTER — Encounter: Payer: Self-pay | Admitting: Cardiovascular Disease

## 2013-12-17 VITALS — BP 160/92 | HR 82 | Ht 66.0 in | Wt 201.0 lb

## 2013-12-17 DIAGNOSIS — I10 Essential (primary) hypertension: Secondary | ICD-10-CM

## 2013-12-17 DIAGNOSIS — E7800 Pure hypercholesterolemia, unspecified: Secondary | ICD-10-CM

## 2013-12-17 NOTE — Patient Instructions (Signed)
You are doing well. Please take clonidine 0.1 mg twice a day for blood pressure  Please monitor your blood pressure  Consider an aspirin 81 mg daily   Please call us if you have new issues that need to be addressed before your next appt.  Your physician wants you to follow-up in: 12 months.  You will receive a reminder letter in the mail two months in advance. If you don't receive a letter, please call our office to schedule the follow-up appointment.

## 2013-12-17 NOTE — Assessment & Plan Note (Addendum)
She prefers to manage this medically. Recommended weight loss, exercise She does have a very strong family history which is concerning

## 2013-12-17 NOTE — Progress Notes (Signed)
   Patient ID: Amanda Estrada, female    DOB: 1961-07-24, 53 y.o.   MRN: [id number]  HPI Comments: Amanda Estrada is a very pleasant 53 y.o. woman with history of hypertension and asthma. She presents for routine followup.  In followup today, she is taking Hyzaar in the morning, one clonidine in the evening. Blood pressure has been running high typically in the 150 range.  Occasionally he has asthma exacerbation Reports her father had bypass surgery in his 39s  mother alive and well in her 50s She denies any new stressors or other issues.  EKG shows normal sinus rhythm with rate 82 beats per minute with no significant ST or T wave changes.    Outpatient Encounter Prescriptions as of 12/17/2013  Medication Sig  . albuterol (PROVENTIL HFA;VENTOLIN HFA) 108 (90 BASE) MCG/ACT inhaler Inhale 2 puffs into the lungs every 6 (six) hours as needed for wheezing.  . cetirizine (ZYRTEC) 10 MG tablet Take 10 mg by mouth daily.  . cloNIDine (CATAPRES) 0.1 MG tablet Name brand only  . fluticasone (FLONASE) 50 MCG/ACT nasal spray 1 PUFF IN THE NOSTRILS DAILY  . fluticasone-salmeterol (ADVAIR HFA) 45-21 MCG/ACT inhaler Inhale 2 puffs into the lungs 2 (two) times daily.  Marland Kitchen HYZAAR 100-25 MG per tablet TAKE 1 TABLET BY MOUTH DAILY.  . montelukast (SINGULAIR) 10 MG tablet TAKE 1 TABLET BY MOUTH AT BEDTIME.  Marland Kitchen omeprazole (PRILOSEC) 40 MG capsule Take 40 mg by mouth daily.  . Red Yeast Rice Extract (RED YEAST RICE PO) Take 1 tablet by mouth daily.  . [DISCONTINUED] sertraline (ZOLOFT) 50 MG tablet TAKE 1/2 TABLET BY MOUTH EACH MORNING      Review of Systems  Constitutional: Negative.   HENT: Negative.   Eyes: Negative.   Respiratory: Negative.   Cardiovascular: Negative.   Gastrointestinal: Negative.   Endocrine: Negative.   Musculoskeletal: Negative.   Skin: Negative.   Allergic/Immunologic: Negative.   Neurological: Negative.   Hematological: Negative.   Psychiatric/Behavioral: Negative.    All other systems reviewed and are negative.    BP 160/92  Pulse 82  Ht 5\' 6"  (1.676 m)  Wt 201 lb (91.173 kg)  BMI 32.46 kg/m2  Physical Exam  Nursing note and vitals reviewed. Constitutional: She is oriented to person, place, and time. She appears well-developed and well-nourished.  HENT:  Head: Normocephalic.  Nose: Nose normal.  Mouth/Throat: Oropharynx is clear and moist.  Eyes: Conjunctivae are normal. Pupils are equal, round, and reactive to light.  Neck: Normal range of motion. Neck supple. No JVD present.  Cardiovascular: Normal rate, regular rhythm, S1 normal, S2 normal, normal heart sounds and intact distal pulses.  Exam reveals no gallop and no friction rub.   No murmur heard. Pulmonary/Chest: Effort normal and breath sounds normal. No respiratory distress. She has no wheezes. She has no rales. She exhibits no tenderness.  Abdominal: Soft. Bowel sounds are normal. She exhibits no distension. There is no tenderness.  Musculoskeletal: Normal range of motion. She exhibits no edema and no tenderness.  Lymphadenopathy:    She has no cervical adenopathy.  Neurological: She is alert and oriented to person, place, and time. Coordination normal.  Skin: Skin is warm and dry. No rash noted. No erythema.  Psychiatric: She has a normal mood and affect. Her behavior is normal. Judgment and thought content normal.    Assessment and Plan

## 2013-12-17 NOTE — Assessment & Plan Note (Signed)
Blood pressure is elevated today and at home by her report. We have suggested she take clonidine up to 0.1 mg twice a day as prescribed. Discussed rebound problems with one clonidine per day. She'll continue on Hyzaar and monitor her blood pressure at home

## 2014-01-21 ENCOUNTER — Other Ambulatory Visit: Payer: Self-pay | Admitting: Family Medicine

## 2014-01-25 ENCOUNTER — Other Ambulatory Visit (INDEPENDENT_AMBULATORY_CARE_PROVIDER_SITE_OTHER): Payer: 59

## 2014-01-25 ENCOUNTER — Telehealth: Payer: Self-pay | Admitting: Family Medicine

## 2014-01-25 DIAGNOSIS — I10 Essential (primary) hypertension: Secondary | ICD-10-CM

## 2014-01-25 DIAGNOSIS — E7800 Pure hypercholesterolemia, unspecified: Secondary | ICD-10-CM

## 2014-01-25 LAB — LIPID PANEL
CHOLESTEROL: 201 mg/dL — AB (ref 0–200)
HDL: 68.2 mg/dL (ref >39.00)
LDL CALC: 118 mg/dL — AB (ref 0–99)
Total CHOL/HDL Ratio: 3
Triglycerides: 74 mg/dL (ref 0.0–149.0)
VLDL: 14.8 mg/dL (ref 0.0–40.0)

## 2014-01-25 LAB — COMPREHENSIVE METABOLIC PANEL
ALK PHOS: 68 U/L (ref 39–117)
ALT: 26 U/L (ref 0–35)
AST: 25 U/L (ref 0–37)
Albumin: 4.5 g/dL (ref 3.5–5.2)
BILIRUBIN TOTAL: 0.5 mg/dL (ref 0.3–1.2)
BUN: 11 mg/dL (ref 6–23)
CO2: 27 mEq/L (ref 19–32)
Calcium: 9.9 mg/dL (ref 8.4–10.5)
Chloride: 100 mEq/L (ref 96–112)
Creatinine, Ser: 0.7 mg/dL (ref 0.4–1.2)
GFR: 96.07 mL/min (ref >60.00)
GLUCOSE: 103 mg/dL — AB (ref 70–99)
Potassium: 4.3 mEq/L (ref 3.5–5.1)
SODIUM: 134 meq/L — AB (ref 135–145)
TOTAL PROTEIN: 7.6 g/dL (ref 6.0–8.3)

## 2014-01-25 NOTE — Telephone Encounter (Signed)
Message copied by Jinny Sanders on Fri Jan 25, 2014  8:19 AM ------      Message from: Ellamae Sia      Created: Fri Jan 18, 2014 11:32 AM      Regarding: Lab orders for Friday, 3.20.15       Patient is scheduled for CPX labs, please order future labs, Thanks , Amanda Estrada       ------

## 2014-01-28 ENCOUNTER — Telehealth: Payer: Self-pay | Admitting: Family Medicine

## 2014-01-28 NOTE — Telephone Encounter (Signed)
Relevant patient education assigned to patient using Emmi. ° °

## 2014-02-01 ENCOUNTER — Ambulatory Visit (INDEPENDENT_AMBULATORY_CARE_PROVIDER_SITE_OTHER): Payer: 59 | Admitting: Family Medicine

## 2014-02-01 ENCOUNTER — Encounter: Payer: Self-pay | Admitting: Family Medicine

## 2014-02-01 VITALS — BP 140/80 | HR 76 | Temp 97.8°F | Ht 66.0 in | Wt 197.5 lb

## 2014-02-01 DIAGNOSIS — Z0000 Encounter for general adult medical examination without abnormal findings: Secondary | ICD-10-CM

## 2014-02-01 DIAGNOSIS — I10 Essential (primary) hypertension: Secondary | ICD-10-CM

## 2014-02-01 DIAGNOSIS — E7800 Pure hypercholesterolemia, unspecified: Secondary | ICD-10-CM

## 2014-02-01 NOTE — Assessment & Plan Note (Signed)
LDL at goal < 130 with lifestyle.

## 2014-02-01 NOTE — Progress Notes (Signed)
Pre visit review using our clinic review tool, if applicable. No additional management support is needed unless otherwise documented below in the visit note. 

## 2014-02-01 NOTE — Patient Instructions (Addendum)
Work on trying to take clonidine as directed.  Follow BP at home. Work on starting exercise 3-5 times a week, work on weight loss, decrease carbohydrates (wine, low  carb diet). Call to scheduled mammogram on your own.

## 2014-02-01 NOTE — Assessment & Plan Note (Signed)
Pt encouraged to follow Dr. Donivan Scull recommendations of increasing clonidine to twice daily.

## 2014-02-01 NOTE — Progress Notes (Addendum)
The patient is here for annual wellness exam and preventative care.  She also has the following chronic health issues:   Chronic cough, asthma, mild presistant.Marland KitchenMarland KitchenImproved with singulair.  She is on Zpack for URI. Improving greatly.  GERD: Improved on pantoprazole.   HTN, fluctuating control... On clonidine and hyzaar. BP. Last year urinary catecholamines neg.  Followed by Dr. Rockey Situ. (Could not tolerate amlodipine)  Using medication without problems or lightheadedness: None  Chest pain with exertion:None  Edema:None  Short of breath:None    Average home BPs: 140-160/80-90 Other issues:  Was told at last OV with Dr. Darnell Level to increase clonidine but has not. BP Readings from Last 3 Encounters:  02/01/14 140/80  12/17/13 160/92  08/17/13 141/79   Elevated Cholesterol: LDL at goal < 130 on no med.  Lab Results  Component Value Date   CHOL 201* 01/25/2014   HDL 68.20 01/25/2014   LDLCALC 118* 01/25/2014   LDLDIRECT 81.0 10/16/2010   TRIG 74.0 01/25/2014   CHOLHDL 3 01/25/2014  Using medications without problems:None Muscle aches: None Diet compliance: poor Exercise: none Other complaints:  Depression,anxiety: stable off zoloft    Prediabetes, stable.  Review of Systems  Constitutional: Negative for fever and fatigue.  HENT: Negative for ear pain.  Eyes: Negative for pain.  Respiratory: Negative for chest tightness and shortness of breath.  Cardiovascular: Negative for chest pain, palpitations and leg swelling.  Gastrointestinal: Negative for abdominal pain.  Genitourinary: Negative for dysuria.  Objective:   Physical Exam  Constitutional:  OVERWEIGHT, Vital signs are normal. She appears well-developed and well-nourished. She is cooperative. Non-toxic appearance. She does not appear ill. No distress.  HENT:  Head: Normocephalic.  Right Ear: Hearing, tympanic membrane, external ear and ear canal normal.  Left Ear: Hearing, tympanic membrane, external ear and ear canal normal.   Nose: Nose normal.  Eyes: Conjunctivae normal, EOM and lids are normal. Pupils are equal, round, and reactive to light. No foreign bodies found.  Neck: Trachea normal and normal range of motion. Neck supple. Carotid bruit is not present. No mass and no thyromegaly present.  Cardiovascular: Normal rate, regular rhythm, S1 normal, S2 normal, normal heart sounds and intact distal pulses. Exam reveals no gallop.  No murmur heard.  Pulmonary/Chest: Effort normal and breath sounds normal. No respiratory distress. She has no wheezes. She has no rhonchi. She has no rales.  Abdominal: Soft. Normal appearance and bowel sounds are normal. She exhibits no distension, no fluid wave, no abdominal bruit and no mass. There is no hepatosplenomegaly. There is no tenderness. There is no rebound, no guarding and no CVA tenderness. No hernia.  Genitourinary: Vagina normal and uterus normal. No breast swelling, tenderness, discharge or bleeding. Pelvic exam was performed with patient prone. There is no rash, tenderness or lesion on the right labia. There is no rash, tenderness or lesion on the left labia. Uterus is not enlarged and not tender.Right adnexum displays no mass, no tenderness and no fullness. Left adnexum displays no mass, no tenderness and no fullness.   No pap! Lymphadenopathy:  She has no cervical adenopathy.  She has no axillary adenopathy.  Neurological: She is alert. She has normal strength. No cranial nerve deficit or sensory deficit.  Skin: Skin is warm, dry and intact. No rash noted.  Psychiatric: Her speech is normal and behavior is normal. Judgment normal. Her mood appears not anxious. Cognition and memory are normal. She does not exhibit a depressed mood.  Assessment & Plan:   The  patient's preventative maintenance and recommended screening tests for an annual wellness exam were reviewed in full today.  Brought up to date unless services declined.  Counselled on the importance of diet,  exercise, and its role in overall health and mortality.  The patient's FH and SH was reviewed, including their home life, tobacco status, and drug and alcohol status.   Vaccines:Td uptodate Nonsmoker  Mammo: Will schedule , nml mammo last year, nml MRI (did because of dense fibroglandular breasts) PAP/DVE: On q 3 year schedule for pap, last nml 2013, due 2016. DVE yearly  Colon: 2014 nml, Dr. Deatra Ina parents both with colon cancer, repeat in 5 years

## 2014-02-11 ENCOUNTER — Other Ambulatory Visit: Payer: Self-pay | Admitting: Cardiovascular Disease

## 2014-02-15 ENCOUNTER — Other Ambulatory Visit: Payer: Self-pay | Admitting: Family Medicine

## 2014-02-28 ENCOUNTER — Encounter: Payer: Self-pay | Admitting: Cardiovascular Disease

## 2014-03-01 ENCOUNTER — Other Ambulatory Visit: Payer: Self-pay

## 2014-03-08 ENCOUNTER — Other Ambulatory Visit: Payer: Self-pay

## 2014-03-08 MED ORDER — HYZAAR 100-25 MG PO TABS: ORAL_TABLET | ORAL | Status: DC

## 2014-03-08 MED ORDER — CATAPRES 0.1 MG PO TABS: 0.1000 mg | ORAL_TABLET | Freq: Two times a day (BID) | ORAL | Status: DC

## 2014-03-08 NOTE — Telephone Encounter (Signed)
Refill sent for brand name Clonidine and Hyzaar 90 day supply

## 2014-04-15 ENCOUNTER — Encounter: Payer: Self-pay | Admitting: Cardiovascular Disease

## 2014-04-18 NOTE — Telephone Encounter (Signed)
Patient stated her blood pressure has been "really high all week"  Yesterday it was 180's/90's three hours after she took her medication  She stated she had no changes in her diet or activity level  She is feeling dizzy and not well when it is high

## 2014-07-10 ENCOUNTER — Other Ambulatory Visit: Payer: Self-pay | Admitting: Cardiovascular Disease

## 2014-07-14 ENCOUNTER — Other Ambulatory Visit: Payer: Self-pay | Admitting: Cardiovascular Disease

## 2014-07-19 ENCOUNTER — Other Ambulatory Visit: Payer: Self-pay | Admitting: Family Medicine

## 2014-08-27 ENCOUNTER — Other Ambulatory Visit: Payer: Self-pay | Admitting: Family Medicine

## 2014-09-17 ENCOUNTER — Encounter: Payer: Self-pay | Admitting: Family Medicine

## 2014-09-20 ENCOUNTER — Ambulatory Visit: Payer: 59

## 2014-09-20 ENCOUNTER — Ambulatory Visit (INDEPENDENT_AMBULATORY_CARE_PROVIDER_SITE_OTHER): Payer: 59 | Admitting: Podiatry

## 2014-09-20 VITALS — BP 163/92 | HR 72 | Resp 16

## 2014-09-20 DIAGNOSIS — M779 Enthesopathy, unspecified: Secondary | ICD-10-CM

## 2014-09-20 DIAGNOSIS — Q667 Congenital pes cavus: Secondary | ICD-10-CM

## 2014-09-20 DIAGNOSIS — M216X9 Other acquired deformities of unspecified foot: Secondary | ICD-10-CM

## 2014-09-20 DIAGNOSIS — M2042 Other hammer toe(s) (acquired), left foot: Secondary | ICD-10-CM

## 2014-09-22 NOTE — Progress Notes (Signed)
Subjective:     Patient ID: Amanda Estrada, female   DOB: 08/29/1961, 53 y.o.   MRN: [id number]  HPIpatient presents stating my left foot is getting worse and the medication from last year only lasted a short period of time. I'm getting more spread between my second and third toes and increased discomfort and I simply cannot walk on my foot comfortably anymore and I know I'm getting need to have something done. I been trying supportive shoes over-the-counter insoles and is equal therapy without relief   Review of Systems     Objective:   Physical Exam Neurovascular status found to be intact with muscle strength adequate and range of motion subtalar and midtarsal joint within normal limits. 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 At this time due to the long-standing nature failure to respond to numerous conservative care that has been provided it has been recommended that this be corrected. I reviewed correction and allow her to sign consent form for shortening osteotomy third metatarsal with transpositional component digital fusion digits 23 left and proximal arthroplasty digits 4. I did explain the risk of surgery and the fact there is no long-term guarantees that this will solve the problem and all complications as outlined in the consent form. Patient wants procedure and signed consent form occur and time and is given preoperative instructions air fracture walker for the postoperative period with instructions on usage and is encouraged to call  with any questions

## 2014-09-30 ENCOUNTER — Ambulatory Visit (INDEPENDENT_AMBULATORY_CARE_PROVIDER_SITE_OTHER): Payer: 59 | Admitting: Cardiovascular Disease

## 2014-09-30 ENCOUNTER — Encounter: Payer: Self-pay | Admitting: Cardiovascular Disease

## 2014-09-30 VITALS — BP 162/64 | HR 104 | Ht 66.0 in | Wt 204.8 lb

## 2014-09-30 DIAGNOSIS — I159 Secondary hypertension, unspecified: Secondary | ICD-10-CM

## 2014-09-30 DIAGNOSIS — R05 Cough: Secondary | ICD-10-CM

## 2014-09-30 DIAGNOSIS — I10 Essential (primary) hypertension: Secondary | ICD-10-CM

## 2014-09-30 DIAGNOSIS — R000 Tachycardia, unspecified: Secondary | ICD-10-CM | POA: Insufficient documentation

## 2014-09-30 DIAGNOSIS — R059 Cough, unspecified: Secondary | ICD-10-CM

## 2014-09-30 DIAGNOSIS — K219 Gastro-esophageal reflux disease without esophagitis: Secondary | ICD-10-CM

## 2014-09-30 DIAGNOSIS — R0789 Other chest pain: Secondary | ICD-10-CM

## 2014-09-30 DIAGNOSIS — J4530 Mild persistent asthma, uncomplicated: Secondary | ICD-10-CM

## 2014-09-30 MED ORDER — CLONIDINE HCL 0.2 MG PO TABS: 0.2000 mg | ORAL_TABLET | Freq: Two times a day (BID) | ORAL | Status: DC

## 2014-09-30 MED ORDER — AMLODIPINE-VALSARTAN-HCTZ 10-320-25 MG PO TABS: 1.0000 | ORAL_TABLET | Freq: Every day | ORAL | Status: DC

## 2014-09-30 NOTE — Assessment & Plan Note (Signed)
I recommended that she increase the clonidine up to 0.2 mg twice a day. Also start exforge 10/320/25 mg daily in the morning Recommended that she monitor her blood pressure and call he office with numbers

## 2014-09-30 NOTE — Assessment & Plan Note (Signed)
Long-standing chronic cough. She attributes this to generic medications that improves when taking brand-name medications. She's not had any further workup. Also blames her asthma

## 2014-09-30 NOTE — Assessment & Plan Note (Signed)
Sinus tachycardia on today's visit. If heart rate continues to run high, could start bystolic for blood pressure and heart rate.

## 2014-09-30 NOTE — Assessment & Plan Note (Signed)
She takes rescue inhaler when necessary. Also on PPI, Singulair. Still with chronic cough

## 2014-09-30 NOTE — Progress Notes (Signed)
Patient ID: Amanda Estrada, female    DOB: 02-13-1961, 53 y.o.   MRN: [id number]  HPI Comments: Ms. Verma is a very pleasant 53 year old woman with history of hypertension and asthma. She presents for routine followup of her blood pressure  She reports that she takes Hyzaar in the morning, clonidine at 3 PM and before bed. Despite this regimen, blood pressure has been running very high. She has a chronic cough, worse at night time. Recent pain in her ribs likely from coughing. [place] off a ladder yesterday while decorating a tree, hurt her left leg lateral side Otherwise no complaints, active at baseline. Weight continues to be an issue Occasionally he has asthma exacerbation She does report that at times her heart rate is elevated  EKG today shows normal sinus rhythm with rate 74 bpm, nonspecific ST abnormality  Other past medical history Reports her father had bypass surgery in his 59s  mother alive and well in her 52s   Outpatient Encounter Prescriptions as of 09/30/2014  Medication Sig  . CATAPRES 0.1 MG tablet TAKE 1 TABLET (0.1 MG TOTAL) BY MOUTH 2 (TWO) TIMES DAILY.  . cetirizine (ZYRTEC) 10 MG tablet Take 10 mg by mouth daily.  . fluticasone (FLONASE) 50 MCG/ACT nasal spray 1 PUFF IN THE NOSTRILS DAILY  . fluticasone-salmeterol (ADVAIR HFA) 45-21 MCG/ACT inhaler Inhale 2 puffs into the lungs 2 (two) times daily.  Marland Kitchen HYZAAR 100-25 MG per tablet TAKE 1 TABLET BY MOUTH DAILY.  . montelukast (SINGULAIR) 10 MG tablet TAKE 1 TABLET BY MOUTH AT BEDTIME.  Marland Kitchen omeprazole (PRILOSEC) 40 MG capsule Take 40 mg by mouth daily.  . VENTOLIN HFA 108 (90 BASE) MCG/ACT inhaler INHALE 2 PUFFS INTO THE LUNGS EVERY 6 (SIX) HOURS AS NEEDED FOR WHEEZING.    Social history  reports that she has never smoked. She has never used smokeless tobacco. She reports that she drinks about 8.4 oz of alcohol per week. She reports that she does not use illicit drugs.   Review of Systems  Constitutional:  Negative.   Respiratory: Negative.   Cardiovascular: Negative.        Tachycardia  Musculoskeletal: Positive for myalgias.  Skin: Negative.   Neurological: Negative.   Hematological: Negative.   Psychiatric/Behavioral: Negative.   All other systems reviewed and are negative.   BP 162/64 mmHg  Pulse 104  Ht 5\' 6"  (1.676 m)  Wt 204 lb 12 oz (92.874 kg)  BMI 33.06 kg/m2  Physical Exam  Constitutional: She is oriented to person, place, and time. She appears well-developed and well-nourished.  Obese  HENT:  Head: Normocephalic.  Nose: Nose normal.  Mouth/Throat: Oropharynx is clear and moist.  Eyes: Conjunctivae are normal. Pupils are equal, round, and reactive to light.  Neck: Normal range of motion. Neck supple. No JVD present.  Cardiovascular: Normal rate, regular rhythm, S1 normal, S2 normal, normal heart sounds and intact distal pulses.  Exam reveals no gallop and no friction rub.   No murmur heard. Pulmonary/Chest: Effort normal and breath sounds normal. No respiratory distress. She has no wheezes. She has no rales. She exhibits no tenderness.  Abdominal: Soft. Bowel sounds are normal. She exhibits no distension. There is no tenderness.  Musculoskeletal: Normal range of motion. She exhibits no edema or tenderness.  Lymphadenopathy:    She has no cervical adenopathy.  Neurological: She is alert and oriented to person, place, and time. Coordination normal.  Skin: Skin is warm and dry. No rash noted. No erythema.  Psychiatric: She has a normal mood and affect. Her behavior is normal. Judgment and thought content normal.    Assessment and Plan  Nursing note and vitals reviewed.

## 2014-09-30 NOTE — Assessment & Plan Note (Signed)
Encouraged her to stay on her omeprazole given her cough and asthma.

## 2014-09-30 NOTE — Patient Instructions (Signed)
You are doing well.  For high blood pressure, Take clonidine 0.2 mg twice a day (noon and before bed) Hold the hyzaar Start exforeg HCT 10/320/25 mg daily  Monitor your blood pressure  Please call us if you have new issues that need to be addressed before your next appt.  Your physician wants you to follow-up in: 6 months.  You will receive a reminder letter in the mail two months in advance. If you don't receive a letter, please call our office to schedule the follow-up appointment.

## 2014-11-12 ENCOUNTER — Encounter: Payer: Self-pay | Admitting: Podiatry

## 2014-11-12 DIAGNOSIS — M2042 Other hammer toe(s) (acquired), left foot: Secondary | ICD-10-CM

## 2014-11-12 DIAGNOSIS — M21542 Acquired clubfoot, left foot: Secondary | ICD-10-CM

## 2014-11-14 ENCOUNTER — Encounter: Payer: Self-pay | Admitting: Podiatry

## 2014-11-14 ENCOUNTER — Telehealth: Payer: Self-pay | Admitting: *Deleted

## 2014-11-14 ENCOUNTER — Encounter: Payer: Self-pay | Admitting: Cardiovascular Disease

## 2014-11-14 NOTE — Telephone Encounter (Signed)
Patient called and stated the pain medication is not helping, and that she is having muscle spasms. Everytime she would fall asleep her body would jump, legs , her arms.  She felt like her bandage might be a little tight, she was having some burning , told her to unwrap the ace bandage and re wrap loosely also to make sure she is icing and elevating.  Told her to take ibuprofen in between her demerol doses and i will talk to dr regal about this see if there is anything else he would recommend

## 2014-11-22 ENCOUNTER — Ambulatory Visit (INDEPENDENT_AMBULATORY_CARE_PROVIDER_SITE_OTHER): Payer: 59

## 2014-11-22 ENCOUNTER — Ambulatory Visit (INDEPENDENT_AMBULATORY_CARE_PROVIDER_SITE_OTHER): Payer: 59 | Admitting: Podiatry

## 2014-11-22 DIAGNOSIS — Z9889 Other specified postprocedural states: Secondary | ICD-10-CM

## 2014-11-22 DIAGNOSIS — M2042 Other hammer toe(s) (acquired), left foot: Secondary | ICD-10-CM

## 2014-11-23 NOTE — Progress Notes (Signed)
Subjective:     Patient ID: Amanda Estrada, female   DOB: [birth year], 54 y.o.   MRN: [id number]  HPI patient states I'm doing very well with my foot with mild swelling with activity and discomfort if him on my foot to much. Patient is one week after forefoot reconstruction left   Review of Systems     Objective:   Physical Exam Neurovascular status is intact negative Homan sign was noted with well coapted surgical sites second third and fourth toes left and third metatarsal left with no drainage noted and good alignment of the digits    Assessment:     Doing well post digital stabilization and metatarsal osteotomy left    Plan:     Reviewed both conditions and x-rays and today reapplied sterile dressing and discussed continued complete immobilization. Reappoint 2 weeks for suture removal earlier if any issues should occur

## 2014-12-03 ENCOUNTER — Ambulatory Visit (INDEPENDENT_AMBULATORY_CARE_PROVIDER_SITE_OTHER): Payer: Self-pay | Admitting: Podiatry

## 2014-12-03 VITALS — BP 133/77 | HR 73 | Resp 16

## 2014-12-03 DIAGNOSIS — Z9889 Other specified postprocedural states: Secondary | ICD-10-CM

## 2014-12-03 DIAGNOSIS — M2042 Other hammer toe(s) (acquired), left foot: Secondary | ICD-10-CM

## 2014-12-03 NOTE — Progress Notes (Signed)
Subjective:     Patient ID: Amanda Estrada, female   DOB: [birth year], 54 y.o.   MRN: [id number]  HPI patient states I'm doing well but having some discomfort when I walk a lot. 3 weeks after foot surgery left   Review of Systems     Objective:   Physical Exam Neurovascular status intact negative Homans sign noted with wound edges well coapted digits and good alignment and pins in place in the lesser digits    Assessment:     Doing well post digital repair of the left foot and metatarsal osteotomy    Plan:     Reviewed continued immobilization and stitches removed with wound edges remain coapted well and dispensed digital splint with instructions. Reappoint 2 weeks for pin removal

## 2014-12-09 ENCOUNTER — Encounter: Payer: Self-pay | Admitting: Podiatry

## 2014-12-17 ENCOUNTER — Ambulatory Visit (INDEPENDENT_AMBULATORY_CARE_PROVIDER_SITE_OTHER): Payer: 59

## 2014-12-17 ENCOUNTER — Ambulatory Visit (INDEPENDENT_AMBULATORY_CARE_PROVIDER_SITE_OTHER): Payer: 59 | Admitting: Podiatry

## 2014-12-17 ENCOUNTER — Encounter: Payer: Self-pay | Admitting: Podiatry

## 2014-12-17 VITALS — BP 138/70 | HR 75 | Resp 16

## 2014-12-17 DIAGNOSIS — M2042 Other hammer toe(s) (acquired), left foot: Secondary | ICD-10-CM

## 2014-12-17 DIAGNOSIS — Z9889 Other specified postprocedural states: Secondary | ICD-10-CM

## 2014-12-17 NOTE — Progress Notes (Signed)
Subjective:     Patient ID: Amanda Estrada, female   DOB: [birth year], 54 y.o.   MRN: [id number]  HPI patient points to the left foot states that it has been bothering me and that I been walking well but I'm getting tired of these pins in my toes   Review of Systems     Objective:   Physical Exam Neurovascular status intact with well-healing surgical sites left second and third toe and third metatarsal with digits and good alignment and pins in place    Assessment:     Doing well postop foot surgery left    Plan:     Remove the pins from the toes applied sterile dressing reviewed x-rays and placed into and anklet. May begin soft shoe usage in the next few weeks and will be seen back in 3 weeks earlier if any issues should occur and may return to work next week

## 2015-01-17 ENCOUNTER — Encounter: Payer: Self-pay | Admitting: Podiatry

## 2015-01-17 ENCOUNTER — Ambulatory Visit (INDEPENDENT_AMBULATORY_CARE_PROVIDER_SITE_OTHER): Payer: Commercial Managed Care - PPO

## 2015-01-17 ENCOUNTER — Ambulatory Visit (INDEPENDENT_AMBULATORY_CARE_PROVIDER_SITE_OTHER): Payer: Commercial Managed Care - PPO | Admitting: Podiatry

## 2015-01-17 VITALS — BP 138/70 | HR 66 | Resp 16

## 2015-01-17 DIAGNOSIS — Q667 Congenital pes cavus: Secondary | ICD-10-CM

## 2015-01-17 DIAGNOSIS — M2042 Other hammer toe(s) (acquired), left foot: Secondary | ICD-10-CM

## 2015-01-17 DIAGNOSIS — M216X9 Other acquired deformities of unspecified foot: Secondary | ICD-10-CM

## 2015-01-17 NOTE — Progress Notes (Signed)
Subjective:     Patient ID: Amanda Estrada, female   DOB: 23-Nov-1960, 54 y.o.   MRN: [id number]  HPI patient is doing very well foot surgery left and states she's walking without pain with mild swelling   Review of Systems     Objective:   Physical Exam Norvasc status intact with good healing of osteotomy site and good digital alignment of the left foot with wound edges well coapted    Assessment:     Doing well postoperative left foot    Plan:     Reviewed x-ray and allow patient to increase neck Timothy and recheck again in 6 weeks or earlier if any issues should occur

## 2015-03-07 ENCOUNTER — Ambulatory Visit: Payer: Commercial Managed Care - PPO | Admitting: Podiatry

## 2015-04-09 NOTE — Progress Notes (Signed)
DOS 11/12/2014 Shortening osteotomy with screw 3rd left, fusion with pin digits 2, 3 left, arthroplasty 4th left proximal

## 2015-06-08 ENCOUNTER — Other Ambulatory Visit: Payer: Self-pay | Admitting: Family Medicine

## 2015-06-24 ENCOUNTER — Other Ambulatory Visit: Payer: Self-pay

## 2015-06-24 NOTE — Telephone Encounter (Signed)
LMOM for the patient to contact our office regarding a refill for Hyzaar 100-25 mg. The patient was told to hold the Hyzaar at the last office visit with Dr. Rockey Situ.

## 2015-06-26 ENCOUNTER — Other Ambulatory Visit: Payer: Self-pay

## 2015-06-26 MED ORDER — HYZAAR 100-25 MG PO TABS: 1.0000 | ORAL_TABLET | Freq: Every day | ORAL | Status: DC

## 2015-06-26 NOTE — Telephone Encounter (Signed)
Refill sent for Hyzaar 100-25 mg

## 2015-06-26 NOTE — Telephone Encounter (Signed)
Spoke with Ms. Cretella and she is still taking the Hyzaar 100-25 mg

## 2015-07-11 ENCOUNTER — Other Ambulatory Visit: Payer: Self-pay | Admitting: Family Medicine

## 2015-07-16 ENCOUNTER — Other Ambulatory Visit: Payer: Self-pay | Admitting: Family Medicine

## 2015-07-30 ENCOUNTER — Ambulatory Visit (INDEPENDENT_AMBULATORY_CARE_PROVIDER_SITE_OTHER): Payer: 59 | Admitting: Cardiovascular Disease

## 2015-07-30 ENCOUNTER — Encounter: Payer: Self-pay | Admitting: Cardiovascular Disease

## 2015-07-30 VITALS — BP 134/78 | HR 70 | Ht 66.0 in | Wt 194.8 lb

## 2015-07-30 DIAGNOSIS — E7800 Pure hypercholesterolemia, unspecified: Secondary | ICD-10-CM

## 2015-07-30 DIAGNOSIS — Z8249 Family history of ischemic heart disease and other diseases of the circulatory system: Secondary | ICD-10-CM | POA: Diagnosis not present

## 2015-07-30 DIAGNOSIS — R000 Tachycardia, unspecified: Secondary | ICD-10-CM | POA: Diagnosis not present

## 2015-07-30 DIAGNOSIS — Z0000 Encounter for general adult medical examination without abnormal findings: Secondary | ICD-10-CM | POA: Insufficient documentation

## 2015-07-30 DIAGNOSIS — I10 Essential (primary) hypertension: Secondary | ICD-10-CM | POA: Diagnosis not present

## 2015-07-30 MED ORDER — MONTELUKAST SODIUM 10 MG PO TABS: 10.0000 mg | ORAL_TABLET | Freq: Every day | ORAL | Status: DC

## 2015-07-30 MED ORDER — PROPRANOLOL HCL 20 MG PO TABS: 20.0000 mg | ORAL_TABLET | Freq: Three times a day (TID) | ORAL | Status: DC | PRN

## 2015-07-30 NOTE — Assessment & Plan Note (Signed)
Long discussion concerning risk factors for coronary artery disease As above, we have ordered CT coronary calcium score, screening study

## 2015-07-30 NOTE — Patient Instructions (Addendum)
You are doing well. No medication changes were made.  We will order a coronary calcium score for strong family hx of coronary disease Wednesday, September 28 @ 9:30 please arrive @ 9:15 There is a one-time fee of $150.00 due at the time of your procredure  Please call us if you have new issues that need to be addressed before your next appt.  Your physician wants you to follow-up in: 12 months.  You will receive a reminder letter in the mail two months in advance. If you don't receive a letter, please call our office to schedule the follow-up appointment.  Coronary Calcium Scan A coronary calcium scan is an imaging test used to look for deposits of calcium and other fatty materials (plaques) in the inner lining of the blood vessels of your heart (coronary arteries). These deposits of calcium and plaques can partly clog and narrow the coronary arteries without producing any symptoms or warning signs. This puts you at risk for a heart attack. This test can detect these deposits before symptoms develop.  LET [facility] CARE PROVIDER KNOW ABOUT:  Any allergies you have.  All medicines you are taking, including vitamins, herbs, eye drops, creams, and over-the-counter medicines.  Previous problems you or members of your family have had with the use of anesthetics.  Any blood disorders you have.  Previous surgeries you have had.  Medical conditions you have.  Possibility of pregnancy, if this applies. RISKS AND COMPLICATIONS Generally, this is a safe procedure. However, as with any procedure, complications can occur. This test involves the use of radiation. Radiation exposure can be dangerous to a pregnant woman and her unborn baby. If you are pregnant, you should not have this procedure done.  BEFORE THE PROCEDURE There is no special preparation for the procedure. PROCEDURE  You will need to undress and put on a hospital gown. You will need to remove any jewelry around your neck or  chest.  Sticky electrodes are placed on your chest and are connected to an electrocardiogram (EKG or electrocardiography) machine to recorda tracing of the electrical activity of your heart.  A CT scanner will take pictures of your heart. During this time, you will be asked to lie still and hold your breath for 2-3 seconds while a picture is being taken of your heart. AFTER THE PROCEDURE   You will be allowed to get dressed.  You can return to your normal activities after the scan is done. Document Released: 04/22/2008 Document Revised: 10/30/2013 Document Reviewed: 07/02/2013 [facility] Patient Information 2015 [place], [place]. This information is not intended to replace advice given to you by your health care provider. Make sure you discuss any questions you have with your health care provider.

## 2015-07-30 NOTE — Assessment & Plan Note (Signed)
Very rare episodes of tachycardia. Possibly atrial tachycardia We have given her a prescription of propranolol to take as needed Also recommended when she has episodes that she call our office and come in urgently for EKG His symptoms get more frequent, could order a 30 day monitor

## 2015-07-30 NOTE — Assessment & Plan Note (Signed)
Blood pressure is well controlled on today's visit. No changes made to the medications. 

## 2015-07-30 NOTE — Assessment & Plan Note (Signed)
Mildly elevated cholesterol She does report family history. CT coronary calcium score ordered in [facility] for risk stratification Depending on these results, potentially could start a low-dose statin

## 2015-07-30 NOTE — Progress Notes (Signed)
Patient ID: Amanda Estrada, female    DOB: 1960/12/11, 54 y.o.   MRN: [id number]  HPI Comments: [name] is a very pleasant 54 year old woman with history of hypertension and asthma. She presents for routine followup of her blood pressure  She reports that blood pressure has been very well controlled on her current regimen. She takes clonidine 0.1 mg at noon, 0.2 mg daily at bedtime, takes losartan HCTZ in the morning On this regimen she has been doing well No other complaints, active  She does report having rare episodes of tachycardia sometimes lasting for several hours at a time Reports having 4 episodes per year No prior workup. Typically when she has these episodes, she sits down or lays down and symptoms eventually resolve She's having difficulty with allergies, out of her Singulair  EKG today shows normal sinus rhythm with rate 70 bpm, no significant ST or T-wave changes   Other past medical history Reports her father had bypass surgery in his 56s  mother alive and well in her 62s  No Known Allergies  Current Outpatient Prescriptions on File Prior to Visit  Medication Sig Dispense Refill  . cetirizine (ZYRTEC) 10 MG tablet Take 10 mg by mouth daily.    . fluticasone (FLONASE) 50 MCG/ACT nasal spray 1 PUFF IN THE NOSTRILS DAILY 16 g 11  . HYZAAR 100-25 MG per tablet Take 1 tablet by mouth daily. 90 tablet 3  . omeprazole (PRILOSEC) 40 MG capsule Take 40 mg by mouth daily.    . VENTOLIN HFA 108 (90 BASE) MCG/ACT inhaler INHALE 2 PUFFS INTO THE LUNGS EVERY 6 (SIX) HOURS AS NEEDED FOR WHEEZING. 18 each 5   No current facility-administered medications on file prior to visit.    Past Medical History  Diagnosis Date  . Allergy   . Anxiety   . Hypertension   . Sinusitis 2011    Recurrent per Dr. Tami Ribas with [facility]  . Asthma     Past Surgical History  Procedure Laterality Date  . Cesarean section  1985    Due to incr BP  . Nsvd  1989    VBAC  . Cervical  discectomy  2001    C5/6  . Renal artery [place]  02/2007    [place] arteries but no stenosis  . Bunionectomy  11/2007    Right foot, 2 hammer-toes and tendon replacement (Dr. Paulla Dolly)  . Endometrial biopsy  03/27/2008    B9 endometrium w/breakdown changes (Dr, Kinscius)  . US transvaginal pelvic modified  04/09/2008    Normal    Social History  reports that she has never smoked. She has never used smokeless tobacco. She reports that she drinks about 8.4 oz of alcohol per week. She reports that she does not use illicit drugs.  Family History family history includes COPD in her father and sister; Cancer in her father and mother; Colon cancer (age of onset: 55) in her mother; Colon cancer (age of onset: 55) in her father; Colon polyps in her brother, sister, sister, and sister; Heart disease in her father; Hypertension in her brother, father, and sister; Pneumonia in her father. There is no history of Esophageal cancer, Rectal cancer, or Stomach cancer.     Review of Systems  Constitutional: Negative.   Respiratory: Negative.   Cardiovascular: Negative.        Tachycardia  Musculoskeletal: Positive for myalgias.  Skin: Negative.   Neurological: Negative.   Hematological: Negative.   Psychiatric/Behavioral: Negative.   All  other systems reviewed and are negative.   BP 134/78 mmHg  Pulse 70  Ht '5\' 6"'$  (1.676 m)  Wt 194 lb 12 oz (88.338 kg)  BMI 31.45 kg/m2  Physical Exam  Constitutional: She is oriented to person, place, and time. She appears well-developed and well-nourished.  Obese  HENT:  Head: Normocephalic.  Nose: Nose normal.  Mouth/Throat: Oropharynx is clear and moist.  Eyes: Conjunctivae are normal. Pupils are equal, round, and reactive to light.  Neck: Normal range of motion. Neck supple. No JVD present.  Cardiovascular: Normal rate, regular rhythm, S1 normal, S2 normal, normal heart sounds and intact distal pulses.  Exam reveals no gallop and no friction rub.   No  murmur heard. Pulmonary/Chest: Effort normal and breath sounds normal. No respiratory distress. She has no wheezes. She has no rales. She exhibits no tenderness.  Abdominal: Soft. Bowel sounds are normal. She exhibits no distension. There is no tenderness.  Musculoskeletal: Normal range of motion. She exhibits no edema or tenderness.  Lymphadenopathy:    She has no cervical adenopathy.  Neurological: She is alert and oriented to person, place, and time. Coordination normal.  Skin: Skin is warm and dry. No rash noted. No erythema.  Psychiatric: She has a normal mood and affect. Her behavior is normal. Judgment and thought content normal.    Assessment and Plan  Nursing note and vitals reviewed.

## 2015-08-05 ENCOUNTER — Other Ambulatory Visit: Payer: Self-pay | Admitting: Cardiovascular Disease

## 2015-08-05 ENCOUNTER — Other Ambulatory Visit: Payer: Self-pay | Admitting: Family Medicine

## 2015-08-05 NOTE — Telephone Encounter (Signed)
°  1. Which medications need to be refilled? Clonidine 0.1 mg after breakfast  2. Which pharmacy is medication to be sent to?  [facility]   3. Do they need a 30 day or 90 day supply? 30  4. Would they like a call back once the medication has been sent to the pharmacy?  If problem with rx

## 2015-08-06 ENCOUNTER — Ambulatory Visit (INDEPENDENT_AMBULATORY_CARE_PROVIDER_SITE_OTHER)
Admission: RE | Admit: 2015-08-06 | Discharge: 2015-08-06 | Disposition: A | Discharge status: Home / Self Care | Payer: 59 | Source: Ambulatory Visit | Attending: Cardiovascular Disease | Admitting: Cardiovascular Disease

## 2015-08-06 ENCOUNTER — Telehealth: Payer: Self-pay

## 2015-08-06 DIAGNOSIS — I10 Essential (primary) hypertension: Secondary | ICD-10-CM

## 2015-08-06 DIAGNOSIS — Z8249 Family history of ischemic heart disease and other diseases of the circulatory system: Secondary | ICD-10-CM

## 2015-08-06 DIAGNOSIS — R000 Tachycardia, unspecified: Secondary | ICD-10-CM

## 2015-08-06 MED ORDER — CLONIDINE HCL 0.2 MG PO TABS: 0.2000 mg | ORAL_TABLET | Freq: Every day | ORAL | Status: DC

## 2015-08-06 MED ORDER — CLONIDINE HCL 0.1 MG PO TABS: ORAL_TABLET | ORAL | Status: DC

## 2015-08-06 NOTE — Telephone Encounter (Signed)
Pt would like a 90 day supply of Clonidine

## 2015-08-06 NOTE — Telephone Encounter (Signed)
90 day supply sent for clonidine 0.1 mg and 0.2 mg.

## 2015-08-11 ENCOUNTER — Telehealth: Payer: Self-pay

## 2015-08-11 MED ORDER — ROSUVASTATIN CALCIUM 5 MG PO TABS: 5.0000 mg | ORAL_TABLET | Freq: Every day | ORAL | Status: DC

## 2015-08-11 NOTE — Telephone Encounter (Signed)
Pt called requesting crestor to be submitted to [facility] on [address]. Prescription sent

## 2015-08-13 ENCOUNTER — Encounter: Payer: Self-pay | Admitting: Cardiovascular Disease

## 2015-08-14 ENCOUNTER — Telehealth: Payer: Self-pay | Admitting: *Deleted

## 2015-08-14 DIAGNOSIS — I499 Cardiac arrhythmia, unspecified: Secondary | ICD-10-CM

## 2015-08-14 NOTE — Telephone Encounter (Signed)
Spoke w/ pt.  She reports that her BP on Sun night was 197/102, HR 55; she took a propranolol before bed. BP yesterday 163/70, HR 63; she took a propranolol before bed. Reports that she feels that her HR is irregular, her monitor shows it is slow, but she is unsure if her monitor is picking up all of her HRs. Dr. Donivan Scull last office note mentioned possible 30 day monitor, which was offered to pt.  Advised her to come in tomorrow for EKG to check her rate and she will decide on 30 day monitor.

## 2015-08-14 NOTE — Telephone Encounter (Signed)
Pt calling stating that she sent a message on mychart  States her HR is staying below 60, making her tired  When she takes her bp it is around the 50s  Not sure what to do Would like some advise on this She can feel it being irregular  Please advise.

## 2015-08-15 ENCOUNTER — Ambulatory Visit (INDEPENDENT_AMBULATORY_CARE_PROVIDER_SITE_OTHER): Payer: 59

## 2015-08-15 VITALS — BP 158/84 | HR 73 | Ht 66.0 in | Wt 193.8 lb

## 2015-08-15 DIAGNOSIS — R000 Tachycardia, unspecified: Secondary | ICD-10-CM

## 2015-08-15 NOTE — Progress Notes (Signed)
1.) Reason for visit: EKG  2.) Name of MD requesting visit: Dr. Rockey Situ  3.) H&P: During pt's last ov 07/30/15, she reported very rare episodes of tachycardia.  She was given an rx for propranolol 20 mg to take prn.  She was advised to call our office for EKG if sx returned and offered 30 day monitor if sx became more frequent. Pt called the office yesterday stating that her BP was elevated at 197/102, HR was low at 55, but she took a propranolol before going to bed, as she felt that her HR was irregular.  Reports her BP on Wed 163/70, HR 63.    4.) ROS related to problem:  She reports considerable fatigue.  Pt did not take propranolol last night.  EKG shows HR 73, pt feels good.  She states that she does not want to take anymore propranolol.  He currently takes Hyzaar 100-25 mg daily, clonidine 0.1 mg in am & at lunchtime, then 0.2 mg before bed.  Pt feels good today.   5.) Assessment and plan per MD:   Advised pt to continue monitoring BP and home and call if it remains elevated.  Advised her to let me know if her sx become more frequent and I will send out 30 day monitor. She would like to know if there another prn med that she can take that won't make her feel so tired.

## 2015-08-15 NOTE — Patient Instructions (Signed)
Continue to monitor your BP and home Call if it remains elevated  I will discuss w/ Dr. Rockey Situ and call you w/ any recommendations

## 2015-08-18 ENCOUNTER — Encounter: Payer: Self-pay | Admitting: Cardiovascular Disease

## 2015-08-18 ENCOUNTER — Other Ambulatory Visit: Payer: Self-pay

## 2015-08-18 DIAGNOSIS — I499 Cardiac arrhythmia, unspecified: Secondary | ICD-10-CM

## 2015-09-22 ENCOUNTER — Other Ambulatory Visit: Payer: Self-pay | Admitting: Cardiovascular Disease

## 2015-09-23 ENCOUNTER — Other Ambulatory Visit: Payer: Self-pay | Admitting: Cardiovascular Disease

## 2015-09-24 ENCOUNTER — Encounter: Payer: Self-pay | Admitting: Family Medicine

## 2015-09-24 MED ORDER — MONTELUKAST SODIUM 10 MG PO TABS: 10.0000 mg | ORAL_TABLET | Freq: Every day | ORAL | Status: DC

## 2015-09-27 ENCOUNTER — Other Ambulatory Visit: Payer: Self-pay | Admitting: Cardiovascular Disease

## 2015-10-21 ENCOUNTER — Other Ambulatory Visit: Payer: Self-pay

## 2015-10-21 NOTE — Telephone Encounter (Signed)
Medication Detail      Disp Refills Start End     cloNIDine (CATAPRES) 0.2 MG tablet 90 tablet 3 08/06/2015     Sig - Route: Take 1 tablet (0.2 mg total) by mouth at bedtime. - Oral    E-Prescribing Status: Receipt confirmed by pharmacy (08/06/2015 9:57 AM EDT)     Pharmacy    [id number] [place], [place]

## 2015-10-22 ENCOUNTER — Other Ambulatory Visit: Payer: Self-pay

## 2015-10-22 MED ORDER — CLONIDINE HCL 0.1 MG PO TABS: 0.1000 mg | ORAL_TABLET | Freq: Two times a day (BID) | ORAL | Status: DC

## 2015-10-22 MED ORDER — CLONIDINE HCL 0.2 MG PO TABS: 0.2000 mg | ORAL_TABLET | Freq: Every day | ORAL | Status: DC

## 2015-10-22 NOTE — Telephone Encounter (Signed)
Refill sent for clonidine 0.1 mg and 0.2 mg

## 2015-11-04 ENCOUNTER — Telehealth: Payer: Self-pay | Admitting: Family Medicine

## 2015-11-04 DIAGNOSIS — E7800 Pure hypercholesterolemia, unspecified: Secondary | ICD-10-CM

## 2015-11-04 DIAGNOSIS — Z1159 Encounter for screening for other viral diseases: Secondary | ICD-10-CM

## 2015-11-04 NOTE — Telephone Encounter (Signed)
-----   Message from Ellamae Sia sent at 10/23/2015 12:20 PM EST ----- Regarding: Lab orders for Wednesday, 12.28.16 Patient is scheduled for CPX labs, please order future labs, Thanks , Karna Christmas

## 2015-11-05 ENCOUNTER — Other Ambulatory Visit: Payer: Self-pay | Admitting: Family Medicine

## 2015-11-05 ENCOUNTER — Other Ambulatory Visit (INDEPENDENT_AMBULATORY_CARE_PROVIDER_SITE_OTHER): Payer: 59

## 2015-11-05 DIAGNOSIS — E7800 Pure hypercholesterolemia, unspecified: Secondary | ICD-10-CM | POA: Diagnosis not present

## 2015-11-05 DIAGNOSIS — Z1159 Encounter for screening for other viral diseases: Secondary | ICD-10-CM

## 2015-11-05 LAB — COMPREHENSIVE METABOLIC PANEL
ALK PHOS: 90 U/L (ref 39–117)
ALT: 24 U/L (ref 0–35)
AST: 19 U/L (ref 0–37)
Albumin: 4.4 g/dL (ref 3.5–5.2)
BILIRUBIN TOTAL: 0.4 mg/dL (ref 0.2–1.2)
BUN: 14 mg/dL (ref 6–23)
CALCIUM: 10.1 mg/dL (ref 8.4–10.5)
CO2: 25 meq/L (ref 19–32)
CREATININE: 0.7 mg/dL (ref 0.40–1.20)
Chloride: 97 mEq/L (ref 96–112)
GFR: 92.29 mL/min (ref >60.00)
Glucose, Bld: 110 mg/dL — ABNORMAL HIGH (ref 70–99)
Potassium: 4.2 mEq/L (ref 3.5–5.1)
Sodium: 131 mEq/L — ABNORMAL LOW (ref 135–145)
TOTAL PROTEIN: 7.7 g/dL (ref 6.0–8.3)

## 2015-11-05 LAB — LIPID PANEL
CHOLESTEROL: 179 mg/dL (ref 0–200)
HDL: 67.6 mg/dL (ref >39.00)
LDL Cholesterol: 82 mg/dL (ref 0–99)
NonHDL: 111.69
TRIGLYCERIDES: 150 mg/dL — AB (ref 0.0–149.0)
Total CHOL/HDL Ratio: 3
VLDL: 30 mg/dL (ref 0.0–40.0)

## 2015-11-06 LAB — HEPATITIS C ANTIBODY: HCV Ab: NEGATIVE

## 2015-11-11 ENCOUNTER — Ambulatory Visit (INDEPENDENT_AMBULATORY_CARE_PROVIDER_SITE_OTHER): Payer: 59 | Admitting: Family Medicine

## 2015-11-11 ENCOUNTER — Other Ambulatory Visit (HOSPITAL_COMMUNITY)
Admission: RE | Admit: 2015-11-11 | Discharge: 2015-11-11 | Disposition: A | Discharge status: Home / Self Care | Payer: 59 | Source: Ambulatory Visit | Attending: Family Medicine | Admitting: Family Medicine

## 2015-11-11 ENCOUNTER — Encounter: Payer: Self-pay | Admitting: Family Medicine

## 2015-11-11 VITALS — BP 130/66 | HR 65 | Temp 97.9°F | Ht 64.75 in | Wt 194.5 lb

## 2015-11-11 DIAGNOSIS — Z01419 Encounter for gynecological examination (general) (routine) without abnormal findings: Secondary | ICD-10-CM | POA: Diagnosis present

## 2015-11-11 DIAGNOSIS — Z1151 Encounter for screening for human papillomavirus (HPV): Secondary | ICD-10-CM | POA: Diagnosis not present

## 2015-11-11 DIAGNOSIS — J4530 Mild persistent asthma, uncomplicated: Secondary | ICD-10-CM

## 2015-11-11 DIAGNOSIS — E7800 Pure hypercholesterolemia, unspecified: Secondary | ICD-10-CM | POA: Diagnosis not present

## 2015-11-11 DIAGNOSIS — Z0000 Encounter for general adult medical examination without abnormal findings: Secondary | ICD-10-CM

## 2015-11-11 DIAGNOSIS — I10 Essential (primary) hypertension: Secondary | ICD-10-CM

## 2015-11-11 MED ORDER — LOSARTAN POTASSIUM 100 MG PO TABS: 100.0000 mg | ORAL_TABLET | Freq: Every day | ORAL | Status: DC

## 2015-11-11 MED ORDER — MONTELUKAST SODIUM 10 MG PO TABS: 10.0000 mg | ORAL_TABLET | Freq: Every day | ORAL | Status: DC

## 2015-11-11 NOTE — Assessment & Plan Note (Signed)
Well controlled on current meds. Given hyponatremia will D/C HCTZ.. Follow BP at home. May need to adjust meds.

## 2015-11-11 NOTE — Progress Notes (Signed)
The patient is here for annual wellness exam and preventative care.  She also has the following chronic health issues:   Chronic cough, asthma, mild presistant.Marland KitchenMarland KitchenImproved with singulair.  GERD: Improved on prilosec 4 mg daily OTC.  Tried to  decrease but has GERD returned..  HTN, fluctuating control... On clonidine 0.1 in AM and 0.2 mg at bedtime >50% spent in counselling or coordination of care at bedtime and hyzaar. BP Readings from Last 3 Encounters:  11/11/15 130/66  08/15/15 158/84  07/30/15 134/78  Followed by Dr. Rockey Situ. (Could not tolerate amlodipine)  Using medication without problems or lightheadedness: None  Chest pain with exertion: None  Edema:None  Short of breath:None  At home: 130/60s  Hyponatremia on HCTZ.  Average home BPs: 140-160/80-90 Other issues: Was told at last OV with Dr. Darnell Level to increase clonidine but has not. BP Readings from Last 3 Encounters:  02/01/14 140/80  12/17/13 160/92  08/17/13 141/79   Elevated Cholesterol: LDL at goal < 130 on crestor 5 mg. Lab Results  Component Value Date   CHOL 179 11/05/2015   HDL 67.60 11/05/2015   LDLCALC 82 11/05/2015   LDLDIRECT 81.0 10/16/2010   TRIG 150.0* 11/05/2015   CHOLHDL 3 11/05/2015  Using medications without problems:None Muscle aches: None Diet compliance: good Exercise: none Other complaints:  Depression,anxiety: stable off zoloft   Prediabetes, stable.  Review of Systems  Constitutional: Negative for fever and fatigue.  HENT: Negative for ear pain.  Eyes: Negative for pain.  Respiratory: Negative for chest tightness and shortness of breath.  Cardiovascular: Negative for chest pain, palpitations and leg swelling.  Gastrointestinal: Negative for abdominal pain.  Genitourinary: Negative for dysuria.  Objective:   Physical Exam  Constitutional: OVERWEIGHT, Vital signs are normal. She appears well-developed and well-nourished. She is cooperative. Non-toxic appearance.  She does not appear ill. No distress.  HENT:  Head: Normocephalic.  Right Ear: Hearing, tympanic membrane, external ear and ear canal normal.  Left Ear: Hearing, tympanic membrane, external ear and ear canal normal.  Nose: Nose normal.  Eyes: Conjunctivae normal, EOM and lids are normal. Pupils are equal, round, and reactive to light. No foreign bodies found.  Neck: Trachea normal and normal range of motion. Neck supple. Carotid bruit is not present. No mass and no thyromegaly present.  Cardiovascular: Normal rate, regular rhythm, S1 normal, S2 normal, normal heart sounds and intact distal pulses. Exam reveals no gallop.  No murmur heard.  Pulmonary/Chest: Effort normal and breath sounds normal. No respiratory distress. She has no wheezes. She has no rhonchi. She has no rales.  Abdominal: Soft. Normal appearance and bowel sounds are normal. She exhibits no distension, no fluid wave, no abdominal bruit and no mass. There is no hepatosplenomegaly. There is no tenderness. There is no rebound, no guarding and no CVA tenderness. No hernia.  Genitourinary: Vagina normal and uterus normal. No breast swelling, tenderness, discharge or bleeding. Pelvic exam was performed with patient prone. There is no rash, tenderness or lesion on the right labia. There is no rash, tenderness or lesion on the left labia. Uterus is not enlarged and not tender.Right adnexum displays no mass, no tenderness and no fullness. Left adnexum displays no mass, no tenderness and no fullness.  PAP performed no cervical changes. Lymphadenopathy:  She has no cervical adenopathy.  She has no axillary adenopathy.  Neurological: She is alert. She has normal strength. No cranial nerve deficit or sensory deficit.  Skin: Skin is warm, dry and intact. No rash noted.  Psychiatric: Her speech is normal and behavior is normal. Judgment normal. Her mood appears not anxious. Cognition and memory are normal. She does not exhibit a  depressed mood.  Assessment & Plan:   The patient's preventative maintenance and recommended screening tests for an annual wellness exam were reviewed in full today.  Brought up to date unless services declined.  Counselled on the importance of diet, exercise, and its role in overall health and mortality.  The patient's FH and SH was reviewed, including their home life, tobacco status, and drug and alcohol status.  Vaccines:Td uptodate, due 2017, refused flu Nonsmoker  Mammo: Will schedule this year. , nml mammo last year, nml MRI (did because of dense fibroglandular breasts) PAP/DVE: On q 3 year schedule for pap, last nml 2013, due 2016. DVE yearly  Colon: 2014 nml, Dr. Deatra Ina parents both with colon cancer, repeat in 5 years Hep C: neg.      Refused HIV.

## 2015-11-11 NOTE — Addendum Note (Signed)
Addended by: Modena Nunnery on: 11/11/2015 03:09 PM   Modules accepted: Orders

## 2015-11-11 NOTE — Assessment & Plan Note (Signed)
Stable control on singulair.

## 2015-11-11 NOTE — Progress Notes (Signed)
Pre visit review using our clinic review tool, if applicable. No additional management support is needed unless otherwise documented below in the visit note. 

## 2015-11-11 NOTE — Patient Instructions (Addendum)
Work on regular exercise 3-5 times a week.  Follow BP now on cozaar alone without HCTZ. Don't limit sodium. Call if BP > 140/90 consistently. Call to schedule mammogram this year.

## 2015-11-11 NOTE — Assessment & Plan Note (Signed)
Improved control on crestor for family hx of CAD per Dr. Rockey Situ.

## 2015-11-14 LAB — CYTOLOGY - PAP

## 2015-11-17 ENCOUNTER — Encounter: Payer: Self-pay | Admitting: *Deleted

## 2015-11-24 ENCOUNTER — Inpatient Hospital Stay (HOSPITAL_COMMUNITY)
Admit: 2015-11-24 | Discharge: 2015-11-24 | Disposition: A | Discharge status: Home / Self Care | Payer: 59 | Attending: Internal Medicine | Admitting: Internal Medicine

## 2015-11-24 ENCOUNTER — Encounter: Payer: Self-pay | Admitting: Emergency Medicine

## 2015-11-24 ENCOUNTER — Inpatient Hospital Stay
Admission: EM | Admit: 2015-11-24 | Discharge: 2015-11-25 | DRG: 310 | Disposition: A | Discharge status: Home / Self Care | Payer: 59 | Attending: Specialist | Admitting: Specialist

## 2015-11-24 ENCOUNTER — Emergency Department: Payer: 59

## 2015-11-24 ENCOUNTER — Telehealth: Payer: Self-pay | Admitting: Cardiovascular Disease

## 2015-11-24 ENCOUNTER — Ambulatory Visit (INDEPENDENT_AMBULATORY_CARE_PROVIDER_SITE_OTHER): Payer: 59

## 2015-11-24 VITALS — BP 221/135 | HR 169 | Wt 198.2 lb

## 2015-11-24 DIAGNOSIS — I6350 Cerebral infarction due to unspecified occlusion or stenosis of unspecified cerebral artery: Secondary | ICD-10-CM | POA: Diagnosis not present

## 2015-11-24 DIAGNOSIS — J45909 Unspecified asthma, uncomplicated: Secondary | ICD-10-CM | POA: Diagnosis present

## 2015-11-24 DIAGNOSIS — K219 Gastro-esophageal reflux disease without esophagitis: Secondary | ICD-10-CM | POA: Diagnosis present

## 2015-11-24 DIAGNOSIS — E785 Hyperlipidemia, unspecified: Secondary | ICD-10-CM | POA: Diagnosis present

## 2015-11-24 DIAGNOSIS — E669 Obesity, unspecified: Secondary | ICD-10-CM | POA: Diagnosis present

## 2015-11-24 DIAGNOSIS — Z7901 Long term (current) use of anticoagulants: Secondary | ICD-10-CM

## 2015-11-24 DIAGNOSIS — I4891 Unspecified atrial fibrillation: Principal | ICD-10-CM

## 2015-11-24 DIAGNOSIS — G4733 Obstructive sleep apnea (adult) (pediatric): Secondary | ICD-10-CM | POA: Diagnosis present

## 2015-11-24 DIAGNOSIS — Z79899 Other long term (current) drug therapy: Secondary | ICD-10-CM | POA: Diagnosis not present

## 2015-11-24 DIAGNOSIS — I10 Essential (primary) hypertension: Secondary | ICD-10-CM | POA: Diagnosis present

## 2015-11-24 DIAGNOSIS — F419 Anxiety disorder, unspecified: Secondary | ICD-10-CM | POA: Diagnosis present

## 2015-11-24 HISTORY — DX: Personal history of other medical treatment: Z92.89

## 2015-11-24 HISTORY — DX: Paroxysmal atrial fibrillation: I48.0

## 2015-11-24 LAB — COMPREHENSIVE METABOLIC PANEL
ALBUMIN: 4.7 g/dL (ref 3.5–5.0)
ALK PHOS: 77 U/L (ref 38–126)
ALT: 23 U/L (ref 14–54)
AST: 23 U/L (ref 15–41)
Anion gap: 9 (ref 5–15)
BILIRUBIN TOTAL: 0.6 mg/dL (ref 0.3–1.2)
BUN: 12 mg/dL (ref 6–20)
CALCIUM: 9.5 mg/dL (ref 8.9–10.3)
CO2: 20 mmol/L — AB (ref 22–32)
Chloride: 105 mmol/L (ref 101–111)
Creatinine, Ser: 0.82 mg/dL (ref 0.44–1.00)
GFR calc Af Amer: >60 mL/min (ref >60)
GFR calc non Af Amer: >60 mL/min (ref >60)
GLUCOSE: 213 mg/dL — AB (ref 65–99)
Potassium: 3.6 mmol/L (ref 3.5–5.1)
SODIUM: 134 mmol/L — AB (ref 135–145)
TOTAL PROTEIN: 8.1 g/dL (ref 6.5–8.1)

## 2015-11-24 LAB — TROPONIN I
Troponin I: <0.03 ng/mL (ref <0.031)
Troponin I: <0.03 ng/mL (ref <0.031)
Troponin I: <0.03 ng/mL (ref <0.031)

## 2015-11-24 LAB — URINALYSIS COMPLETE WITH MICROSCOPIC (ARMC ONLY)
BACTERIA UA: NONE SEEN
Bilirubin Urine: NEGATIVE
GLUCOSE, UA: 150 mg/dL — AB
HGB URINE DIPSTICK: NEGATIVE
Ketones, ur: NEGATIVE mg/dL
LEUKOCYTES UA: NEGATIVE
Nitrite: NEGATIVE
Protein, ur: NEGATIVE mg/dL
SPECIFIC GRAVITY, URINE: 1.004 — AB (ref 1.005–1.030)
SQUAMOUS EPITHELIAL / LPF: NONE SEEN
pH: 7 (ref 5.0–8.0)

## 2015-11-24 LAB — CBC
HEMATOCRIT: 41.6 % (ref 35.0–47.0)
HEMOGLOBIN: 14 g/dL (ref 12.0–16.0)
MCH: 30.7 pg (ref 26.0–34.0)
MCHC: 33.8 g/dL (ref 32.0–36.0)
MCV: 90.9 fL (ref 80.0–100.0)
Platelets: 251 10*3/uL (ref 150–440)
RBC: 4.57 MIL/uL (ref 3.80–5.20)
RDW: 12.9 % (ref 11.5–14.5)
WBC: 10.2 10*3/uL (ref 3.6–11.0)

## 2015-11-24 LAB — TSH: TSH: 1.653 u[IU]/mL (ref 0.350–4.500)

## 2015-11-24 LAB — MRSA PCR SCREENING: MRSA by PCR: NEGATIVE

## 2015-11-24 LAB — GLUCOSE, CAPILLARY: Glucose-Capillary: 113 mg/dL — ABNORMAL HIGH (ref 65–99)

## 2015-11-24 MED ORDER — DILTIAZEM HCL 30 MG PO TABS
30.0000 mg | ORAL_TABLET | Freq: Four times a day (QID) | ORAL | Status: DC
  Administered 2015-11-24 – 2015-11-25 (×2): 30 mg via ORAL
  Filled 2015-11-24 (×2): qty 1

## 2015-11-24 MED ORDER — DILTIAZEM HCL 25 MG/5ML IV SOLN
20.0000 mg | Freq: Once | INTRAVENOUS | Status: AC
  Administered 2015-11-24: 20 mg via INTRAVENOUS
  Filled 2015-11-24: qty 5

## 2015-11-24 MED ORDER — LOSARTAN POTASSIUM 50 MG PO TABS
100.0000 mg | ORAL_TABLET | Freq: Every day | ORAL | Status: DC
  Administered 2015-11-24 – 2015-11-25 (×2): 100 mg via ORAL
  Filled 2015-11-24 (×2): qty 2

## 2015-11-24 MED ORDER — HYDRALAZINE HCL 20 MG/ML IJ SOLN: 10.0000 mg | Freq: Four times a day (QID) | INTRAMUSCULAR | Status: DC | PRN

## 2015-11-24 MED ORDER — HEPARIN BOLUS VIA INFUSION
4000.0000 [IU] | Freq: Once | INTRAVENOUS | Status: AC
  Administered 2015-11-24: 4000 [IU] via INTRAVENOUS
  Filled 2015-11-24: qty 4000

## 2015-11-24 MED ORDER — LORATADINE 10 MG PO TABS
10.0000 mg | ORAL_TABLET | Freq: Every day | ORAL | Status: DC
  Administered 2015-11-25: 10 mg via ORAL
  Filled 2015-11-24 (×2): qty 1

## 2015-11-24 MED ORDER — SODIUM CHLORIDE 0.9 % IV BOLUS (SEPSIS)
1000.0000 mL | Freq: Once | INTRAVENOUS | Status: AC
  Administered 2015-11-24: 1000 mL via INTRAVENOUS

## 2015-11-24 MED ORDER — FLUTICASONE PROPIONATE 50 MCG/ACT NA SUSP
2.0000 | Freq: Every day | NASAL | Status: DC | PRN
  Filled 2015-11-24: qty 16

## 2015-11-24 MED ORDER — CLONIDINE HCL 0.1 MG PO TABS: 0.1000 mg | ORAL_TABLET | Freq: Every morning | ORAL | Status: DC

## 2015-11-24 MED ORDER — CLONIDINE HCL 0.1 MG PO TABS
0.2000 mg | ORAL_TABLET | Freq: Every day | ORAL | Status: DC
  Administered 2015-11-24: 0.2 mg via ORAL
  Filled 2015-11-24: qty 2

## 2015-11-24 MED ORDER — DILTIAZEM HCL 25 MG/5ML IV SOLN
INTRAVENOUS | Status: AC
  Administered 2015-11-24: 20 mg via INTRAVENOUS
  Filled 2015-11-24: qty 5

## 2015-11-24 MED ORDER — HEPARIN (PORCINE) IN NACL 100-0.45 UNIT/ML-% IJ SOLN
1100.0000 [IU]/h | INTRAMUSCULAR | Status: DC
  Administered 2015-11-24: 1100 [IU]/h via INTRAVENOUS
  Filled 2015-11-24 (×2): qty 250

## 2015-11-24 MED ORDER — ALPRAZOLAM 0.5 MG PO TABS
0.2500 mg | ORAL_TABLET | Freq: Once | ORAL | Status: AC
  Administered 2015-11-24: 0.25 mg via ORAL
  Filled 2015-11-24: qty 1

## 2015-11-24 MED ORDER — MONTELUKAST SODIUM 10 MG PO TABS
10.0000 mg | ORAL_TABLET | Freq: Every day | ORAL | Status: DC
  Administered 2015-11-24: 10 mg via ORAL
  Filled 2015-11-24: qty 1

## 2015-11-24 MED ORDER — ALBUTEROL SULFATE (2.5 MG/3ML) 0.083% IN NEBU: 2.5000 mg | INHALATION_SOLUTION | Freq: Four times a day (QID) | RESPIRATORY_TRACT | Status: DC | PRN

## 2015-11-24 MED ORDER — DEXTROSE 5 % IV SOLN
5.0000 mg/h | Freq: Once | INTRAVENOUS | Status: AC
  Administered 2015-11-24: 5 mg/h via INTRAVENOUS
  Filled 2015-11-24: qty 100

## 2015-11-24 MED ORDER — METOPROLOL TARTRATE 25 MG PO TABS
25.0000 mg | ORAL_TABLET | Freq: Two times a day (BID) | ORAL | Status: DC
  Administered 2015-11-24 – 2015-11-25 (×2): 25 mg via ORAL
  Filled 2015-11-24 (×2): qty 1

## 2015-11-24 MED ORDER — PANTOPRAZOLE SODIUM 40 MG PO TBEC
40.0000 mg | DELAYED_RELEASE_TABLET | Freq: Every day | ORAL | Status: DC
  Administered 2015-11-24 – 2015-11-25 (×2): 40 mg via ORAL
  Filled 2015-11-24 (×2): qty 1

## 2015-11-24 MED ORDER — ROSUVASTATIN CALCIUM 10 MG PO TABS
5.0000 mg | ORAL_TABLET | Freq: Every day | ORAL | Status: DC
  Filled 2015-11-24: qty 1

## 2015-11-24 MED ORDER — DILTIAZEM HCL 100 MG IV SOLR
5.0000 mg/h | INTRAVENOUS | Status: DC
  Filled 2015-11-24: qty 100

## 2015-11-24 MED ORDER — DILTIAZEM HCL 25 MG/5ML IV SOLN
20.0000 mg | Freq: Once | INTRAVENOUS | Status: AC
  Administered 2015-11-24: 20 mg via INTRAVENOUS

## 2015-11-24 NOTE — Progress Notes (Signed)
*  PRELIMINARY RESULTS* Echocardiogram 2D Echocardiogram has been performed.  Amanda Estrada 11/24/2015, 9:23 PM

## 2015-11-24 NOTE — Telephone Encounter (Signed)
Advised pt to come in now for EKG. She is also sched to see Ignacia Bayley, NP tomorrow.  Advised front desk that pt can have EKG today or wait until tomorrow to see Gerald Stabs, it is up to pt's preference, but I would like to do EKG while she is symptomatic.

## 2015-11-24 NOTE — ED Notes (Signed)
Pt from [facility] , with tachycardia , and EKG showing ACUTE MI, pt denies any chest pain.

## 2015-11-24 NOTE — Progress Notes (Signed)
[facility] Progress Note Patient Name: Amanda Estrada DOB: Oct 11, 1961 MRN: [id number]   Date of Service  11/24/2015  HPI/Events of Note  AF-RVR  cardizem gtt  eICU Interventions  Clonidine for high BP - home med     Intervention Category Evaluation Type: New Patient Evaluation  [name],[name] 11/24/2015, 7:59 PM

## 2015-11-24 NOTE — Telephone Encounter (Signed)
Patient c/o Palpitations:  High priority if patient c/o lightheadedness and shortness of breath.  1. How long have you been having palpitations?  Today last hour   2. Are you currently experiencing lightheadedness and shortness of breath? Sob  Dizzy shaky cold sweats fast heartbeat  High bp   Says she took 2 clonidine in last 30 min    3. Have you checked your BP and heart rate? (document readings)    No   4. Are you experiencing any other symptoms?        no  pcp recently changed hyzaar to cozarr

## 2015-11-24 NOTE — H&P (Signed)
[facility] NAME: Amanda Estrada    MR#:  [id number]  DATE OF BIRTH:  03/19/1961  DATE OF ADMISSION:  11/24/2015  PRIMARY CARE PHYSICIAN: Eliezer Lofts, MD   REQUESTING/REFERRING PHYSICIAN: Paduchowski.  CHIEF COMPLAINT:   Chief Complaint  Patient presents with  . Tachycardia    HISTORY OF PRESENT ILLNESS: Amanda Estrada  is a 55 y.o. female with a known history of hypertension, sinusitis, asthma, anxiety disorder- follows with Dr. Araceli Bouche in the office for episodes of palpitations and hypertension in the past. Today after lunch she started having palpitation episode associated with some shortness of breath and was not getting any relief so went to Dr. Rogelia Boga office they did an EKG and checked her and as her heart rate was running up to 160 or 170 and her blood pressure was up to 824 in systolic they decided to send her to emergency room right away. In ER she was given injection Cardizem twice with the short lasting response and she stayed in atrial fibrillation with rapid ventricular response so ER physician decided to start her on injection Cardizem and given to hospitalist team for further management. Patient denies any chest pain, she agrees that she had some anxiety today.  PAST MEDICAL HISTORY:   Past Medical History  Diagnosis Date  . Allergy   . Anxiety   . Hypertension   . Sinusitis 2011    Recurrent per Dr. Tami Ribas with [facility]  . Asthma     PAST SURGICAL HISTORY: Past Surgical History  Procedure Laterality Date  . Cesarean section  1985    Due to incr BP  . Nsvd  1989    VBAC  . Cervical discectomy  2001    C5/6  . Renal artery [place]  02/2007    Serpentine arteries but no stenosis  . Bunionectomy  11/2007    Right foot, 2 hammer-toes and tendon replacement (Dr. Paulla Dolly)  . Endometrial biopsy  03/27/2008    B9 endometrium w/breakdown changes (Dr, Kinscius)  . US transvaginal pelvic modified   04/09/2008    Normal    SOCIAL HISTORY:  Social History  Substance Use Topics  . Smoking status: Never Smoker   . Smokeless tobacco: Never Used  . Alcohol Use: 8.4 oz/week    14 Glasses of wine per week     Comment: 2 glasses of wine every day    FAMILY HISTORY:  Family History  Problem Relation Age of Onset  . Cancer Mother     Colon, partial colectomy, melanoma x 2  . Colon cancer Mother 21  . Heart disease Father     CHF  . Hypertension Father   . Cancer Father     Colon  . Pneumonia Father   . COPD Father     Lung failure (Vent x 4 weeks)  . Colon cancer Father 65  . COPD Sister   . Colon polyps Sister   . Hypertension Brother   . Colon polyps Brother   . Hypertension Sister   . Colon polyps Sister   . Colon polyps Sister   . Esophageal cancer Neg Hx   . Rectal cancer Neg Hx   . Stomach cancer Neg Hx     DRUG ALLERGIES: No Known Allergies  REVIEW OF SYSTEMS:   CONSTITUTIONAL: No fever, fatigue or weakness.  EYES: No blurred or double vision.  EARS, NOSE, AND THROAT: No tinnitus or ear pain.  RESPIRATORY: No  cough, positive for shortness of breath, no wheezing or hemoptysis.  CARDIOVASCULAR: No chest pain, orthopnea, edema. Have palpitation. GASTROINTESTINAL: No nausea, vomiting, diarrhea or abdominal pain.  GENITOURINARY: No dysuria, hematuria.  ENDOCRINE: No polyuria, nocturia,  HEMATOLOGY: No anemia, easy bruising or bleeding SKIN: No rash or lesion. MUSCULOSKELETAL: No joint pain or arthritis.   NEUROLOGIC: No tingling, numbness, weakness.  PSYCHIATRY: No anxiety or depression.   MEDICATIONS AT HOME:  Prior to Admission medications   Medication Sig Start Date End Date Taking? Authorizing Provider  albuterol (PROVENTIL HFA;VENTOLIN HFA) 108 (90 Base) MCG/ACT inhaler Inhale 2 puffs into the lungs every 6 (six) hours as needed for wheezing or shortness of breath.   Yes Historical Provider, MD  cetirizine (ZYRTEC) 10 MG tablet Take 10 mg by mouth  daily.   Yes Historical Provider, MD  cloNIDine (CATAPRES) 0.1 MG tablet Take 0.1-0.2 mg by mouth 2 (two) times daily. Pt takes one tablet in the morning and two tablets at bedtime.   Pt also takes an additional tablet during the day if needed.   Yes Historical Provider, MD  fluticasone (FLONASE) 50 MCG/ACT nasal spray Place 2 sprays into both nostrils daily as needed for rhinitis.   Yes Historical Provider, MD  losartan (COZAAR) 100 MG tablet Take 1 tablet (100 mg total) by mouth daily. 11/11/15  Yes Amy E Bedsole, MD  montelukast (SINGULAIR) 10 MG tablet Take 1 tablet (10 mg total) by mouth at bedtime. 11/11/15  Yes Amy Cletis Athens, MD  omeprazole (PRILOSEC) 40 MG capsule Take 40 mg by mouth daily.   Yes Historical Provider, MD  rosuvastatin (CRESTOR) 5 MG tablet Take 1 tablet (5 mg total) by mouth daily. 08/11/15  Yes Minna Merritts, MD      PHYSICAL EXAMINATION:   VITAL SIGNS: Blood pressure 172/84, pulse 132, resp. rate 19, height '5\' 6"'$  (1.676 m), weight 86.183 kg (190 lb), SpO2 97 %.  GENERAL:  55 y.o.-year-old patient lying in the bed with no acute distress.  EYES: Pupils equal, round, reactive to light and accommodation. No scleral icterus. Extraocular muscles intact.  HEENT: Head atraumatic, normocephalic. Oropharynx and nasopharynx clear.  NECK:  Supple, no jugular venous distention. No thyroid enlargement, no tenderness.  LUNGS: Normal breath sounds bilaterally, no wheezing, rales,rhonchi or crepitation. No use of accessory muscles of respiration.  CARDIOVASCULAR: S1, S2 is irregular and fast. No murmurs, rubs, or gallops.  ABDOMEN: Soft, nontender, nondistended. Bowel sounds present. No organomegaly or mass.  EXTREMITIES: No pedal edema, cyanosis, or clubbing.  NEUROLOGIC: Cranial nerves II through XII are intact. Muscle strength 5/5 in all extremities. Sensation intact. Gait not checked.  PSYCHIATRIC: The patient is alert and oriented x 3.  SKIN: No obvious rash, lesion, or ulcer.    LABORATORY PANEL:   CBC  Recent Labs Lab 11/24/15 1529  WBC 10.2  HGB 14.0  HCT 41.6  PLT 251  MCV 90.9  MCH 30.7  MCHC 33.8  RDW 12.9   ------------------------------------------------------------------------------------------------------------------  Chemistries   Recent Labs Lab 11/24/15 1529  NA 134*  K 3.6  CL 105  CO2 20*  GLUCOSE 213*  BUN 12  CREATININE 0.82  CALCIUM 9.5  AST 23  ALT 23  ALKPHOS 77  BILITOT 0.6   ------------------------------------------------------------------------------------------------------------------ estimated creatinine clearance is 85.8 mL/min (by C-G formula based on Cr of 0.82). ------------------------------------------------------------------------------------------------------------------ No results for input(s): TSH, T4TOTAL, T3FREE, THYROIDAB in the last 72 hours.  Invalid input(s): FREET3   Coagulation profile No results for input(s):  INR, PROTIME in the last 168 hours. ------------------------------------------------------------------------------------------------------------------- No results for input(s): DDIMER in the last 72 hours. -------------------------------------------------------------------------------------------------------------------  Cardiac Enzymes  Recent Labs Lab 11/24/15 1529  TROPONINI <0.03   ------------------------------------------------------------------------------------------------------------------ Invalid input(s): POCBNP  ---------------------------------------------------------------------------------------------------------------  Urinalysis    Component Value Date/Time   COLORURINE lt. yellow 05/19/2007 0856   APPEARANCEUR Clear 05/19/2007 0856   LABSPEC <1.005 05/19/2007 0856   PHURINE 7.5 05/19/2007 0856   HGBUR negative 05/19/2007 0856   BILIRUBINUR negative 05/19/2007 0856   UROBILINOGEN 0.2 05/19/2007 0856   NITRITE negative 05/19/2007 0856      RADIOLOGY: Dg Chest Portable 1 View  11/24/2015  CLINICAL DATA:  55 year old female with history of tachycardia and possible acute myocardial infarction today by EKG. Shortness of breath. EXAM: PORTABLE CHEST 1 VIEW COMPARISON:  Chest x-ray a 04/27/2012. FINDINGS: Lung volumes are normal. No consolidative airspace disease. No pleural effusions. No pneumothorax. No pulmonary nodule or mass noted. Pulmonary vasculature and the cardiomediastinal silhouette are within normal limits. Atherosclerosis in the thoracic aorta. Transcutaneous defibrillator pads project over the left hemithorax. IMPRESSION: 1.  No radiographic evidence of acute cardiopulmonary disease. 2. Atherosclerosis. Electronically Signed   By: Vinnie Langton M.D.   On: 11/24/2015 16:22    EKG: Orders placed or performed during the hospital encounter of 11/24/15  . EKG 12-Lead  . EKG 12-Lead    IMPRESSION AND PLAN:  * Atrial fibrillation with rapid ventricular response  Started on Cardizem IV drip as ordered by ER physician, follow serial troponin and do echocardiogram.  Monitor in ICU.  We'll give heparin IV drip because of atrial fibrillation.  Cardiology consult with her primary cardiologist.  Give Xanax 1 dose as she said there was some anxiety also.  * Uncontrolled hypertension  Blood pressure systolic is running up to 200.  Starting on Cardizem drip might help some with that.  I will add metoprolol oral to her home medication losartan.  Give hydralazine injection as needed basis to control the blood pressure.  * Hyperlipidemia  Continue statin.  * Anxiety  I will give her Xanax 1 dose for now.   All the records are reviewed and case discussed with ED provider. Management plans discussed with the patient, family and they are in agreement.  CODE STATUS: Full code Code Status History    This patient does not have a recorded code status. Please follow your organizational policy for patients in this  situation.     Patient's husband, son, daughter present in the room, I discussed the plan with them also.  TOTAL TIME TAKING CARE OF THIS PATIENT: 50 critical care minutes.    Vaughan Basta M.D on 11/24/2015   Between 7am to 6pm - Pager - [phone]  After 6pm go to [email or web address] - password [email or web address] [facility]  Office  [phone]  CC: Primary care physician; Eliezer Lofts, MD   Note: This dictation was prepared with Dragon dictation along with smaller phrase technology. Any transcriptional errors that result from this process are unintentional.

## 2015-11-24 NOTE — ED Provider Notes (Signed)
[facility] Emergency Department Provider Note  Time seen: 3:30 PM  I have reviewed the triage vital signs and the nursing notes.   HISTORY  Chief Complaint Tachycardia    HPI Amanda Estrada is a 55 y.o. female with a past medical history of hypertension who presents to the emergency department from [facility] clinic with tachycardia. According to the patient at 1 PM today at work she acutely became lightheaded, dizzy, nauseated, felt like she is going to pass out, felt her heart was racing. She took an extra blood pressure pill thinking that could help open (clonidine) but it did not so she went to the heart care clinic for evaluation. Patient denies any history of cardiac issues, her only medical history is of high blood pressure. At the heart clinic the patient's heart rate was found to be greater than 200 and she was sent to the emergency department for evaluation. Upon arrival to the emergency department the patient appears to be in atrial fibrillation with rapid ventricular response. Patient denies any history of atrial fibrillation in the past. States mild chest pressure but denies any "pain." Denies shortness of breath at rest states she becomes very short of breath when she attempted to sit up or walk. Denies any leg pain or swelling.     Past Medical History  Diagnosis Date  . Allergy   . Anxiety   . Hypertension   . Sinusitis 2011    Recurrent per Dr. Tami Ribas with [facility]  . Asthma     Patient Active Problem List   Diagnosis Date Noted  . Routine adult health maintenance 07/30/2015  . Tachycardia 09/30/2014  . Mild persistent asthma 04/27/2012  . PURE HYPERCHOLESTEROLEMIA 10/16/2010  . COUGH, CHRONIC 10/07/2010  . GERD 08/14/2009  . ANXIETY 03/09/2007  . Essential hypertension 03/09/2007  . ALLERGIC RHINITIS 03/09/2007    Past Surgical History  Procedure Laterality Date  . Cesarean section  1985    Due to incr BP  . Nsvd  1989   VBAC  . Cervical discectomy  2001    C5/6  . Renal artery [place]  02/2007    Serpentine arteries but no stenosis  . Bunionectomy  11/2007    Right foot, 2 hammer-toes and tendon replacement (Dr. Paulla Dolly)  . Endometrial biopsy  03/27/2008    B9 endometrium w/breakdown changes (Dr, Kinscius)  . US transvaginal pelvic modified  04/09/2008    Normal    Current Outpatient Rx  Name  Route  Sig  Dispense  Refill  . cetirizine (ZYRTEC) 10 MG tablet   Oral   Take 10 mg by mouth daily.         . cloNIDine (CATAPRES) 0.1 MG tablet   Oral   Take 1 tablet (0.1 mg total) by mouth 2 (two) times daily.   180 tablet   3     Dispense as written.   . cloNIDine (CATAPRES) 0.2 MG tablet   Oral   Take 1 tablet (0.2 mg total) by mouth at bedtime.   90 tablet   3     Dispense as written.   . fluticasone (FLONASE) 50 MCG/ACT nasal spray      1 PUFF IN THE NOSTRILS DAILY   16 g   11   . losartan (COZAAR) 100 MG tablet   Oral   Take 1 tablet (100 mg total) by mouth daily.   90 tablet   3     Name brand only   .  montelukast (SINGULAIR) 10 MG tablet   Oral   Take 1 tablet (10 mg total) by mouth at bedtime.   90 tablet   3   . omeprazole (PRILOSEC) 40 MG capsule   Oral   Take 40 mg by mouth daily.         . rosuvastatin (CRESTOR) 5 MG tablet   Oral   Take 1 tablet (5 mg total) by mouth daily.   30 tablet   11   . VENTOLIN HFA 108 (90 BASE) MCG/ACT inhaler      INHALE 2 PUFFS INTO THE LUNGS EVERY 6 (SIX) HOURS AS NEEDED FOR WHEEZING.   18 Inhaler   2     Allergies Review of patient's allergies indicates no known allergies.  Family History  Problem Relation Age of Onset  . Cancer Mother     Colon, partial colectomy, melanoma x 2  . Colon cancer Mother 12  . Heart disease Father     CHF  . Hypertension Father   . Cancer Father     Colon  . Pneumonia Father   . COPD Father     Lung failure (Vent x 4 weeks)  . Colon cancer Father 100  . COPD Sister   . Colon polyps  Sister   . Hypertension Brother   . Colon polyps Brother   . Hypertension Sister   . Colon polyps Sister   . Colon polyps Sister   . Esophageal cancer Neg Hx   . Rectal cancer Neg Hx   . Stomach cancer Neg Hx     Social History Social History  Substance Use Topics  . Smoking status: Never Smoker   . Smokeless tobacco: Never Used  . Alcohol Use: 8.4 oz/week    14 Glasses of wine per week     Comment: 2 glasses of wine every day    Review of Systems Constitutional: Negative for fever. Positive for dizziness/lightheadedness. Cardiovascular: Negative for chest pain. Positive for chest pressure. Respiratory: Her breath with exertion. Gastrointestinal: Negative for abdominal pain Neurological: Negative for headache 10-point ROS otherwise negative.  ____________________________________________   PHYSICAL EXAM:  VITAL SIGNS: ED Triage Vitals  Enc Vitals Group     BP 11/24/15 1520 188/137 mmHg     Pulse Rate 11/24/15 1520 177     Resp 11/24/15 1520 20     Temp --      Temp src --      SpO2 11/24/15 1520 96 %     Weight 11/24/15 1520 190 lb (86.183 kg)     Height 11/24/15 1520 '5\' 6"'$  (1.676 m)     Head Cir --      Peak Flow --      Pain Score 11/24/15 1515 0     Pain Loc --      Pain Edu? --      Excl. in [place]? --     Constitutional: Alert and oriented. Well appearing and in no distress. Eyes: Normal exam ENT   Head: Normocephalic and atraumatic.   Mouth/Throat: Mucous membranes are moist. Cardiovascular: Irregular rhythm, rate around 200 bpm. Respiratory: Normal respiratory effort without tachypnea nor retractions. Breath sounds are clear and equal bilaterally. No wheezes/rales/rhonchi. Gastrointestinal: Soft and nontender. No distention.   Musculoskeletal: Nontender with normal range of motion in all extremities. No lower extremity tenderness or edema. Neurologic:  Normal speech and language. No gross focal neurologic deficits  Skin:  Skin is warm, dry and  intact.  Psychiatric: Mood and  affect are normal. Speech and behavior are normal.  ____________________________________________    EKG  EKG reviewed and interpreted by myself shows atrial fibrillation with rapid ventricular response of 169 bpm, narrow QRS, right axis deviation, mild ST elevations in 1, aVL, with ST depressions fairly diffusely. Computer is reading this as acute MI/STEMI, believe this to be more rate-related demand ischemia. Patient denies any chest pain at this time.  EKG number 2:15: 13:59 read and interpreted by myself shows atrial fib relation with rapid ventricular response at 194 bpm, patient does have elevation in aVR, rather diffuse ST depressions. Again I believe this is likely more rate related. We will attempt to slow the rate with diltiazem and repeat EKG. Again patient denies chest pain.  Repeat EKG number 3:15: 36:52 reviewed and interpreted by myself shows atrial fibrillation 154 bpm. Continues to show diffuse ST depression, no longer see any elevations. Narrow QRS, normal axis. Continue to suspect demand ischemia.  ____________________________________________    RADIOLOGY  Chest x-ray no acute abnormality  ____________________________________________    INITIAL IMPRESSION / ASSESSMENT AND PLAN / ED COURSE  Pertinent labs & imaging results that were available during my care of the patient were reviewed by me and considered in my medical decision making (see chart for details).  Patient with what appears to be new onset atrial fibrillation with rapid ventricular response rate around 190-200 bpm. Patient's blood pressure considerably elevated 188/137 currently. Patient does 20 mg of diltiazem immediately in the emergency department with good response. Heart rate currently around 120 bpm but appears to remain in atrial fibrillation. We will repeat an EKG. States she is feeling much better denies any chest pain or pressure at this time.  Status post 2 doses  of IV diltiazem the patient remains tachycardic, currently on a diltiazem drip with a heart rate around 100 bpm.   CRITICAL CARE Performed by: Harvest Dark   Total critical care time: 45 minutes  Critical care time was exclusive of separately billable procedures and treating other patients.  Critical care was necessary to treat or prevent imminent or life-threatening deterioration.  Critical care was time spent personally by me on the following activities: development of treatment plan with patient and/or surrogate as well as nursing, discussions with consultants, evaluation of patient's response to treatment, examination of patient, obtaining history from patient or surrogate, ordering and performing treatments and interventions, ordering and review of laboratory studies, ordering and review of radiographic studies, pulse oximetry and re-evaluation of patient's condition.   ____________________________________________   FINAL CLINICAL IMPRESSION(S) / ED DIAGNOSES  Atrial Fibrillation with rapid ventricular response   Harvest Dark, MD 11/24/15 1552

## 2015-11-24 NOTE — Progress Notes (Addendum)
1.) Reason for visit: EKG  2.) Name of MD requesting visit: Dr. Rockey Situ  3.) H&P: Pt's last ov 07/30/15:    "Very rare episodes of tachycardia. Possibly atrial tachycardia We have given her a prescription of propranolol to take as needed Also recommended when she has episodes that she call our office and come in urgently for EKG His symptoms get more frequent, could order a 30 day monitor"  4.) ROS related to problem: Pt called the office stating that she developed rapid HR around 1:00 today.  She was asked to come over for EKG.  Sx usually resolve on their own, but sx persisted.  She reports SOB and HA.  Pt took 2 clonidine prior to coming in.  Pt reports that her PCP recently d/c'd her propranolol "because my sodium was too low".  EKG shows new afib, HR 169.  BP 221/135.   5.) Assessment and plan per MD: Dr. Fletcher Anon reviewed EKG and advised that pt be taken to ED.  Pt transported via wheelchair and taken directly to room #14.

## 2015-11-24 NOTE — ED Notes (Signed)
Patient denies chest pain at this time. Patient states she does feel SOB

## 2015-11-24 NOTE — Progress Notes (Signed)
ANTICOAGULATION CONSULT NOTE - Initial Consult  Pharmacy Consult for Heparin Drip Indication: atrial fibrillation  No Known Allergies  Patient Measurements: Height: '5\' 6"'$  (167.6 cm) Weight: 190 lb (86.183 kg) IBW/kg (Calculated) : 59.3 Heparin Dosing Weight: 77.7 kg  Vital Signs: BP: 128/72 mmHg (01/16 1816) Pulse Rate: 134 (01/16 1816)  Labs:  Recent Labs  11/24/15 1529  HGB 14.0  HCT 41.6  PLT 251  CREATININE 0.82  TROPONINI <0.03    Estimated Creatinine Clearance: 85.8 mL/min (by C-G formula based on Cr of 0.82).   Medical History: Past Medical History  Diagnosis Date  . Allergy   . Anxiety   . Hypertension   . Sinusitis 2011    Recurrent per Dr. Tami Ribas with [facility]  . Asthma     Medications:  Scheduled:  . metoprolol tartrate  25 mg Oral BID   Infusions:  . heparin 1,100 Units/hr (11/24/15 1814)    Assessment: Pharmacy consulted to dose and titrate heparin drip in a 55 yo female with new onset atrial fibrillation.  Per med rec, patient not taking anticoagulants as outpatient.    Est CrCl~85.8 mL/min, aPTT and INR pending  Goal of Therapy:  Heparin level 0.3-0.7 units/ml Monitor platelets by anticoagulation protocol: Yes   Plan:  Give 4000 units bolus x 1 Start heparin infusion at 1100 units/hr Check anti-Xa level in 6 hours and daily while on heparin Continue to monitor H&H and platelets  Guliana Weyandt G 11/24/2015,6:25 PM

## 2015-11-24 NOTE — ED Notes (Signed)
MD at bedside. 

## 2015-11-24 NOTE — Progress Notes (Signed)
Pt arrived to ICU 18 at 1830. V/S stable with a HR of 99, A & O x 4, lung sounds clear to auscultation. Family at bedside.

## 2015-11-25 ENCOUNTER — Other Ambulatory Visit: Payer: Self-pay | Admitting: Cardiovascular Disease

## 2015-11-25 ENCOUNTER — Ambulatory Visit: Payer: 59 | Admitting: Nurse Practitioner

## 2015-11-25 ENCOUNTER — Encounter: Payer: Self-pay | Admitting: Physician Assistant

## 2015-11-25 DIAGNOSIS — I10 Essential (primary) hypertension: Secondary | ICD-10-CM

## 2015-11-25 DIAGNOSIS — G4733 Obstructive sleep apnea (adult) (pediatric): Secondary | ICD-10-CM

## 2015-11-25 DIAGNOSIS — I4891 Unspecified atrial fibrillation: Principal | ICD-10-CM

## 2015-11-25 LAB — BASIC METABOLIC PANEL
ANION GAP: 7 (ref 5–15)
BUN: 14 mg/dL (ref 6–20)
CO2: 22 mmol/L (ref 22–32)
Calcium: 9.1 mg/dL (ref 8.9–10.3)
Chloride: 104 mmol/L (ref 101–111)
Creatinine, Ser: 0.68 mg/dL (ref 0.44–1.00)
GFR calc Af Amer: >60 mL/min (ref >60)
Glucose, Bld: 118 mg/dL — ABNORMAL HIGH (ref 65–99)
POTASSIUM: 3.9 mmol/L (ref 3.5–5.1)
SODIUM: 133 mmol/L — AB (ref 135–145)

## 2015-11-25 LAB — TROPONIN I: Troponin I: <0.03 ng/mL (ref <0.031)

## 2015-11-25 LAB — CBC
HCT: 36.7 % (ref 35.0–47.0)
Hemoglobin: 12.3 g/dL (ref 12.0–16.0)
MCH: 30.9 pg (ref 26.0–34.0)
MCHC: 33.5 g/dL (ref 32.0–36.0)
MCV: 92.3 fL (ref 80.0–100.0)
PLATELETS: 224 10*3/uL (ref 150–440)
RBC: 3.97 MIL/uL (ref 3.80–5.20)
RDW: 13.2 % (ref 11.5–14.5)
WBC: 5.9 10*3/uL (ref 3.6–11.0)

## 2015-11-25 LAB — APTT: aPTT: 61 seconds — ABNORMAL HIGH (ref 24–36)

## 2015-11-25 LAB — HEMOGLOBIN A1C: HEMOGLOBIN A1C: 5.5 % (ref 4.0–6.0)

## 2015-11-25 LAB — HEPARIN LEVEL (UNFRACTIONATED)
HEPARIN UNFRACTIONATED: 0.42 [IU]/mL (ref 0.30–0.70)
HEPARIN UNFRACTIONATED: 0.39 [IU]/mL (ref 0.30–0.70)

## 2015-11-25 LAB — PROTIME-INR
INR: 1.02
PROTHROMBIN TIME: 13.6 s (ref 11.4–15.0)

## 2015-11-25 MED ORDER — ROSUVASTATIN CALCIUM 10 MG PO TABS: 20.0000 mg | ORAL_TABLET | Freq: Every day | ORAL | Status: DC

## 2015-11-25 MED ORDER — METOPROLOL TARTRATE 25 MG PO TABS: 25.0000 mg | ORAL_TABLET | Freq: Two times a day (BID) | ORAL | Status: DC

## 2015-11-25 MED ORDER — APIXABAN 5 MG PO TABS: 5.0000 mg | ORAL_TABLET | Freq: Two times a day (BID) | ORAL | Status: DC

## 2015-11-25 MED ORDER — APIXABAN 5 MG PO TABS
5.0000 mg | ORAL_TABLET | Freq: Two times a day (BID) | ORAL | Status: DC
  Administered 2015-11-25: 5 mg via ORAL
  Filled 2015-11-25: qty 1

## 2015-11-25 MED ORDER — ROSUVASTATIN CALCIUM 20 MG PO TABS: 20.0000 mg | ORAL_TABLET | Freq: Every day | ORAL | Status: DC

## 2015-11-25 NOTE — Consult Note (Signed)
Cardiology Consultation Note  Patient ID: CALAYAH GUADARRAMA, MRN: [id number], DOB/AGE: 56-27-1962 56 y.o. Admit date: 11/24/2015   Date of Consult: 11/25/2015 Primary Physician: Eliezer Lofts, MD Primary Cardiologist: Dr. Rockey Situ, MD  Chief Complaint: Tachy-palpitations and SOB Reason for Consult: New onset Afib  HPI: 55 y.o. female with h/o possible atrial tachycardia with tachy-palpitations previously on propranolol, HTN, asthma, anxiety, and obesity who presented to the office on 1/16 with tachy-palpitations and was found to be in new onset Afib with RVR and hypertensive with BP of 221/135. She was set to the ED for admission. Cardiology is consulted for further evaluation.   She reports having previous episodes of of rare tachy-palpitations, sometimes lasting for hours before self resolving. She was previously on propranolol prn, though only took this once and self discontinued as this dropped her HR to the 60's and she did not like the way she felt with a HR in the 60s. She apparently has these tachy-palpitations about 4 times per year, usually while on vacation.   Upon arriving at work on 1/16 she received the unfortunate news about a prior co-worker that attempted suicide. This stressed her out considerably. Shortly after this she developed tachy-palpitations and SOB. No chest pain. She attempted to lay down at work to get them to resolve but they didn't so she called our office. She came in for an ECG which showed Afib with RVR with a HR of 154. Her BP was 221/135. She denied any chest pain, but was SOB. She was taken to the ED. Of note, she does note occasional BP spikes similar to the above in the evening without associated symptoms.   Upon the patient's arrival to [facility] they were found to have troponin negative x 4, K+ 3.6-->3.9, SCr 0.82-->0.68, unremarkable CBC. ECG showed Afib with RVR, 154 bpn, diffuses st/t changes, CXR showed no evidence of acute cardiopulmonary disease. She was started  on diltiazem gtt, PO diltiazem, Lopressor, and heparin gtt. She converted to sinus at 7:57 PM on 11/24/2015 and has remained in sinus since.    Past Medical History  Diagnosis Date  . Allergy   . Anxiety   . Hypertension   . Sinusitis 2011    Recurrent per Dr. Tami Ribas with [facility]  . Asthma   . PAF (paroxysmal atrial fibrillation) ([place])     a. CHADS2VASc = 2 (HTN, sex category)  . History of echocardiogram     a. 11/2015: EF 55-60%, no RWMA, nl LV diastolic fxn, PASP nl      Most Recent Cardiac Studies: Echo 11/24/2015: Study Conclusions - Left ventricle: The cavity size was normal. Systolic function was normal. The estimated ejection fraction was in the range of 55% to 60%. Wall motion was normal; there were no regional wall motion abnormalities. Left ventricular diastolic function parameters were normal. - Left atrium: The atrium was normal in size. - Right ventricle: Systolic function was normal. - Pulmonary arteries: Systolic pressure was within the normal range. Impressions: - Normal study.   Surgical History:  Past Surgical History  Procedure Laterality Date  . Cesarean section  1985    Due to incr BP  . Nsvd  1989    VBAC  . Cervical discectomy  2001    C5/6  . Renal artery [place]  02/2007    [place] arteries but no stenosis  . Bunionectomy  11/2007    Right foot, 2 hammer-toes and tendon replacement (Dr. Paulla Dolly)  . Endometrial biopsy  03/27/2008  B9 endometrium w/breakdown changes (Dr, Kinscius)  . US transvaginal pelvic modified  04/09/2008    Normal     Home Meds: Prior to Admission medications   Medication Sig Start Date End Date Taking? Authorizing Provider  albuterol (PROVENTIL HFA;VENTOLIN HFA) 108 (90 Base) MCG/ACT inhaler Inhale 2 puffs into the lungs every 6 (six) hours as needed for wheezing or shortness of breath.   Yes Historical Provider, MD  cetirizine (ZYRTEC) 10 MG tablet Take 10 mg by mouth daily.   Yes Historical Provider, MD   cloNIDine (CATAPRES) 0.1 MG tablet Take 0.1-0.2 mg by mouth 2 (two) times daily. Pt takes one tablet in the morning and two tablets at bedtime.   Pt also takes an additional tablet during the day if needed.   Yes Historical Provider, MD  fluticasone (FLONASE) 50 MCG/ACT nasal spray Place 2 sprays into both nostrils daily as needed for rhinitis.   Yes Historical Provider, MD  losartan (COZAAR) 100 MG tablet Take 1 tablet (100 mg total) by mouth daily. 11/11/15  Yes Amy E Bedsole, MD  montelukast (SINGULAIR) 10 MG tablet Take 1 tablet (10 mg total) by mouth at bedtime. 11/11/15  Yes Amy Cletis [place], MD  omeprazole (PRILOSEC) 40 MG capsule Take 40 mg by mouth daily.   Yes Historical Provider, MD  rosuvastatin (CRESTOR) 5 MG tablet Take 1 tablet (5 mg total) by mouth daily. 08/11/15  Yes Minna Merritts, MD    Inpatient Medications:  . cloNIDine  0.1 mg Oral q morning - 10a  . cloNIDine  0.2 mg Oral QHS  . diltiazem  30 mg Oral 4 times per day  . loratadine  10 mg Oral Daily  . losartan  100 mg Oral Daily  . metoprolol tartrate  25 mg Oral BID  . montelukast  10 mg Oral QHS  . pantoprazole  40 mg Oral Daily  . rosuvastatin  5 mg Oral Daily   . diltiazem (CARDIZEM) infusion Stopped (11/24/15 2220)  . heparin 1,100 Units/hr (11/25/15 0700)    Allergies: No Known Allergies  Social History   Social History  . Marital Status: Married    Spouse Name: N/A  . Number of Children: 2  . Years of Education: N/A   Occupational History  . Air cabin crew, Museum/gallery curator    Social History Main Topics  . Smoking status: Never Smoker   . Smokeless tobacco: Never Used  . Alcohol Use: 8.4 oz/week    14 Glasses of wine per week     Comment: 2 glasses of wine every day  . Drug Use: No  . Sexual Activity: Not on file   Other Topics Concern  . Not on file   Social History Narrative   No regular exercise, used to but unable to do given chronic cough.     Family History  Problem Relation Age of  Onset  . Cancer Mother     Colon, partial colectomy, melanoma x 2  . Colon cancer Mother 27  . Heart disease Father     CHF  . Hypertension Father   . Cancer Father     Colon  . Pneumonia Father   . COPD Father     Lung failure (Vent x 4 weeks)  . Colon cancer Father 14  . COPD Sister   . Colon polyps Sister   . Hypertension Brother   . Colon polyps Brother   . Hypertension Sister   . Colon polyps Sister   . Colon polyps  Sister   . Esophageal cancer Neg Hx   . Rectal cancer Neg Hx   . Stomach cancer Neg Hx      Review of Systems: Review of Systems  Constitutional: Positive for malaise/fatigue. Negative for fever, chills, weight loss and diaphoresis.  HENT: Negative for congestion and sore throat.   Eyes: Negative for discharge and redness.  Respiratory: Positive for shortness of breath. Negative for cough, hemoptysis, sputum production and wheezing.   Cardiovascular: Positive for palpitations. Negative for chest pain, orthopnea, claudication, leg swelling and PND.  Gastrointestinal: Negative for heartburn, nausea, vomiting, abdominal pain, diarrhea, constipation, blood in stool and melena.  Genitourinary: Negative for hematuria.  Musculoskeletal: Negative for myalgias, back pain, joint pain, falls and neck pain.  Skin: Negative for rash.  Neurological: Positive for weakness. Negative for dizziness, tingling, tremors, sensory change, speech change, focal weakness, seizures, loss of consciousness and headaches.  Endo/Heme/Allergies: Does not bruise/bleed easily.  Psychiatric/Behavioral: Negative for substance abuse. The patient is not nervous/anxious.   All other systems reviewed and are negative.    Labs:  Recent Labs  11/24/15 1529 11/24/15 1810 11/24/15 2104 11/25/15 0050  TROPONINI <0.03 <0.03 <0.03 <0.03   Lab Results  Component Value Date   WBC 5.9 11/25/2015   HGB 12.3 11/25/2015   HCT 36.7 11/25/2015   MCV 92.3 11/25/2015   PLT 224 11/25/2015      Recent Labs Lab 11/24/15 1529 11/25/15 0658  NA 134* 133*  K 3.6 3.9  CL 105 104  CO2 20* 22  BUN 12 14  CREATININE 0.82 0.68  CALCIUM 9.5 9.1  PROT 8.1  --   BILITOT 0.6  --   ALKPHOS 77  --   ALT 23  --   AST 23  --   GLUCOSE 213* 118*   Lab Results  Component Value Date   CHOL 179 11/05/2015   HDL 67.60 11/05/2015   LDLCALC 82 11/05/2015   TRIG 150.0* 11/05/2015   No results found for: DDIMER  Radiology/Studies:  Dg Chest Portable 1 View  11/24/2015  CLINICAL DATA:  55 year old female with history of tachycardia and possible acute myocardial infarction today by EKG. Shortness of breath. EXAM: PORTABLE CHEST 1 VIEW COMPARISON:  Chest x-ray a 04/27/2012. FINDINGS: Lung volumes are normal. No consolidative airspace disease. No pleural effusions. No pneumothorax. No pulmonary nodule or mass noted. Pulmonary vasculature and the cardiomediastinal silhouette are within normal limits. Atherosclerosis in the thoracic aorta. Transcutaneous defibrillator pads project over the left hemithorax. IMPRESSION: 1.  No radiographic evidence of acute cardiopulmonary disease. 2. Atherosclerosis. Electronically Signed   By: Vinnie Langton M.D.   On: 11/24/2015 16:22    EKG: Afib with RVR, 154 bpn, diffuses st/t changes   Weights: Filed Weights   11/24/15 1520  Weight: 190 lb (86.183 kg)     Physical Exam: Blood pressure 135/76, pulse 70, temperature 98 F (36.7 C), temperature source Oral, resp. rate 15, height '5\' 6"'$  (1.676 m), weight 190 lb (86.183 kg), SpO2 100 %. Body mass index is 30.68 kg/(m^2). General: Well developed, well nourished, in no acute distress. Head: Normocephalic, atraumatic, sclera non-icteric, no xanthomas, nares are without discharge.  Neck: Negative for carotid bruits. JVD not elevated. Lungs: Clear bilaterally to auscultation without wheezes, rales, or rhonchi. Breathing is unlabored. Heart: RRR with S1 S2. No murmurs, rubs, or gallops appreciated. Abdomen:  Soft, non-tender, non-distended with normoactive bowel sounds. No hepatomegaly. No rebound/guarding. No obvious abdominal masses. Msk:  Strength and tone appear normal  for age. Extremities: No clubbing or cyanosis. No edema.  Distal pedal pulses are 2+ and equal bilaterally. Neuro: Alert and oriented X 3. No facial asymmetry. No focal deficit. Moves all extremities spontaneously. Psych:  Responds to questions appropriately with a normal affect.    Assessment and Plan:   1. New onset Afib with RVR: -Currently in sinus rhythm with HR in the 60s to 70s -Possibly in the setting of her receiving the news of her prior co-worker attempting suicide -Would aim for rate control  -Lopressor 25 mg bid prn tachy-palpitations  -CHADS2VASc at least 2 (HTN, sex category) -Given her prior history of tachy-palps with possible atrial tach could pursue long term, full-dose anticoagulation for at least 30 days with either Xarelto 20 mg q dinner (CrCl 128.18 mL/min) or Eliquis 5 mg bid. Risks and benefits were discussed -Echo as above, normal -Could consider outpatient nuc, though unlikely at this time given no anginal symptoms and negative troponins in the setting of Afib with RVR -Patient can use [employer] phone APP to evaluate for possible further Afib vs atrial tach. This will be helpful to determine for possible continuation of anticoagulation in the future at her hospital follow up   2. HTN: -Much improved -Accelerated readings likely in the setting of #1 -Will swap clonidine for Lopressor in an effort to treat both her Afib and and HTN -Discontinue clonidine as this may lead to increased bradycardic rates. Should this occur she could potentially continue on her losartan  -Will continue losartan at this time given the discontinuation of clonidine above for treatment of HTN  3. Obesity/possible OSA: -Needs outpatient sleep study -Likely playing a role in #1   Signed, Christell Faith, PA-C Pager: [phone] 11/25/2015, 8:37 AM  Patient interviewed and examined.  history of recurrent tachypalpitations similar to what presented yesterday wherein she was found to have atrial fibrillation with a rapid rate. In the past these episodes have been largely self terminating within an hour or 2. Mostly she has laid down with the spells to minimize symptoms of lightheadedness and dyspnea.  Thromboembolic risk factors are noted for gender and hypertension for a CHADS-VASc score of 2.  She has episodes of recurrent left arm weakness and numbness which is likely related to carpal tunnel as there is thenar weakness.  She has symptoms consistent with sleep apnea with daytime somnolence and sleep disordered breathing.  She has treated hypertension with a recent discontinuation of her diuretic because of mild hyponatremia.    Recommendations 1-anticoagulation for at least 4 weeks. There are data from a recent [place] registry study highlighting the variable impact of a CHADS-VASc vascular score 0.1 and the wall on variability and risk associated with age. 2-outpatient sleep study 3-continue Cozaar for blood pressure as there are data that we'll decrease the risks of recurrent atrial fibrillation, data for which we do not have with beta blockers and to use metoprolol on an as-needed basis 4-use [employer] monitor to clarify the mechanism of her recurrent tachypalpitations

## 2015-11-25 NOTE — Progress Notes (Signed)
ANTICOAGULATION CONSULT NOTE - FOLLOW UP   Pharmacy Consult for Heparin Drip Indication: atrial fibrillation  No Known Allergies  Patient Measurements: Height: '5\' 6"'$  (167.6 cm) Weight: 190 lb (86.183 kg) IBW/kg (Calculated) : 59.3 Heparin Dosing Weight: 77.7 kg  Vital Signs: Temp: 97.5 F (36.4 C) (01/17 0200) Temp Source: Oral (01/17 0200) BP: 115/67 mmHg (01/17 0632) Pulse Rate: 62 (01/17 0600)  Labs:  Recent Labs  11/24/15 1529 11/24/15 1810 11/24/15 2104 11/25/15 0050 11/25/15 0658  HGB 14.0  --   --   --  12.3  HCT 41.6  --   --   --  36.7  PLT 251  --   --   --  224  HEPARINUNFRC  --   --   --  0.39 0.42  CREATININE 0.82  --   --   --  0.68  TROPONINI <0.03 <0.03 <0.03 <0.03  --     Estimated Creatinine Clearance: 87.9 mL/min (by C-G formula based on Cr of 0.68).   Medical History: Past Medical History  Diagnosis Date  . Allergy   . Anxiety   . Hypertension   . Sinusitis 2011    Recurrent per Dr. Tami Ribas with [facility]  . Asthma     Medications:  Scheduled:  . cloNIDine  0.1 mg Oral q morning - 10a  . cloNIDine  0.2 mg Oral QHS  . diltiazem  30 mg Oral 4 times per day  . loratadine  10 mg Oral Daily  . losartan  100 mg Oral Daily  . metoprolol tartrate  25 mg Oral BID  . montelukast  10 mg Oral QHS  . pantoprazole  40 mg Oral Daily  . rosuvastatin  5 mg Oral Daily   Infusions:  . diltiazem (CARDIZEM) infusion Stopped (11/24/15 2220)  . heparin 1,100 Units/hr (11/24/15 1814)    Assessment: Pharmacy consulted to dose and titrate heparin drip in a 55 yo female with new onset atrial fibrillation.  Per med rec, patient not taking anticoagulants as outpatient.    Est CrCl~85.8 mL/min, aPTT and INR pending  Goal of Therapy:  Heparin level 0.3-0.7 units/ml Monitor platelets by anticoagulation protocol: Yes   Plan:  First heparin level therapeutic. Continue current rate, will confirm in 6 hours.  1/17: 2nd heparin level therapeutic;  therefore will continue the same rate and order heparin level with am labs.   Filmore Molyneux D, Pharm.D., BCPS Clinical Pharmacist 11/25/2015,7:46 AM

## 2015-11-25 NOTE — Progress Notes (Signed)
ANTICOAGULATION CONSULT NOTE - Initial Consult  Pharmacy Consult for Heparin Drip Indication: atrial fibrillation  No Known Allergies  Patient Measurements: Height: '5\' 6"'$  (167.6 cm) Weight: 190 lb (86.183 kg) IBW/kg (Calculated) : 59.3 Heparin Dosing Weight: 77.7 kg  Vital Signs: BP: 134/77 mmHg (01/16 2151) Pulse Rate: 134 (01/16 1816)  Labs:  Recent Labs  11/24/15 1529 11/24/15 1810 11/24/15 2104 11/25/15 0050  HGB 14.0  --   --   --   HCT 41.6  --   --   --   PLT 251  --   --   --   HEPARINUNFRC  --   --   --  0.39  CREATININE 0.82  --   --   --   TROPONINI <0.03 <0.03 <0.03 <0.03    Estimated Creatinine Clearance: 85.8 mL/min (by C-G formula based on Cr of 0.82).   Medical History: Past Medical History  Diagnosis Date  . Allergy   . Anxiety   . Hypertension   . Sinusitis 2011    Recurrent per Dr. Tami Ribas with [facility]  . Asthma     Medications:  Scheduled:  . cloNIDine  0.1 mg Oral q morning - 10a  . cloNIDine  0.2 mg Oral QHS  . diltiazem  30 mg Oral 4 times per day  . loratadine  10 mg Oral Daily  . losartan  100 mg Oral Daily  . metoprolol tartrate  25 mg Oral BID  . montelukast  10 mg Oral QHS  . pantoprazole  40 mg Oral Daily  . rosuvastatin  5 mg Oral Daily   Infusions:  . diltiazem (CARDIZEM) infusion Stopped (11/24/15 2220)  . heparin 1,100 Units/hr (11/24/15 1814)    Assessment: Pharmacy consulted to dose and titrate heparin drip in a 55 yo female with new onset atrial fibrillation.  Per med rec, patient not taking anticoagulants as outpatient.    Est CrCl~85.8 mL/min, aPTT and INR pending  Goal of Therapy:  Heparin level 0.3-0.7 units/ml Monitor platelets by anticoagulation protocol: Yes   Plan:  First heparin level therapeutic. Continue current rate, will confirm in 6 hours.  Laural Benes, Pharm.D., BCPS Clinical Pharmacist 11/25/2015,1:49 AM

## 2015-11-25 NOTE — Discharge Summary (Signed)
[facility] NAME: Amanda Estrada    MR#:  [id number]  DATE OF BIRTH:  08-Mar-1961  DATE OF ADMISSION:  11/24/2015 ADMITTING PHYSICIAN: Vaughan Basta, MD  DATE OF DISCHARGE: 11/25/2015  3:19 PM  PRIMARY CARE PHYSICIAN: Eliezer Lofts, MD    ADMISSION DIAGNOSIS:  CP  DISCHARGE DIAGNOSIS:  Principal Problem:   Atrial fibrillation with rapid ventricular response (HCC) Active Problems:   Atrial fibrillation with RVR ([facility])   Obstructive sleep apnea   SECONDARY DIAGNOSIS:   Past Medical History  Diagnosis Date  . Allergy   . Anxiety   . Hypertension   . Sinusitis 2011    Recurrent per Dr. Tami Ribas with [facility]  . Asthma   . PAF (paroxysmal atrial fibrillation) ([facility])     a. CHADS2VASc = 2 (HTN, sex category)  . History of echocardiogram     a. 11/2015: EF 55-60%, no RWMA, nl LV diastolic fxn, PASP nl    HOSPITAL COURSE:   55 year old female with past medical history of anxiety, hypertension, asthma, paroxysmal defibrillation who presented to the hospital due to chest pain and noted to be in atrial fibrillation with rapid ventricular response.  #1 Atrial fibrillation with RVR-this was the cause of patient's palpitations and shortness of breath on admission. Initially patient was placed on a Cardizem drip and then weaned off of it and has not converted to normal sinus rhythm. -Patient was seen by cardiology but Dr. Virl Axe also Dr. Rockey Situ. They recommended stopping her clonidine and switching her to Lopressor for rate control. She was also started on long-term anticoagulation as she's had paroxysms of tachycardia. She was therefore discharged on Eliquis.  - She was ambulated and did not have any further symptoms and therefore was discharged home.  # 2 hypertension-she was taken off the clonidine and started on oral Lopressor. She will continue her losartan.  #3 hyperlipidemia-she will resume her  Crestor.  #4 GERD-continue Protonix.  DISCHARGE CONDITIONS:   Stable  CONSULTS OBTAINED:  Treatment Team:  Wellington Hampshire, MD  DRUG ALLERGIES:  No Known Allergies  DISCHARGE MEDICATIONS:   Discharge Medication List as of 11/25/2015  2:14 PM    START taking these medications   Details  apixaban (ELIQUIS) 5 MG TABS tablet Take 1 tablet (5 mg total) by mouth 2 (two) times daily., Starting 11/25/2015, Until Discontinued, Print    metoprolol tartrate (LOPRESSOR) 25 MG tablet Take 1 tablet (25 mg total) by mouth 2 (two) times daily., Starting 11/25/2015, Until Discontinued, Print      CONTINUE these medications which have NOT CHANGED   Details  albuterol (PROVENTIL HFA;VENTOLIN HFA) 108 (90 Base) MCG/ACT inhaler Inhale 2 puffs into the lungs every 6 (six) hours as needed for wheezing or shortness of breath., Until Discontinued, Historical Med    cetirizine (ZYRTEC) 10 MG tablet Take 10 mg by mouth daily., Until Discontinued, Historical Med    fluticasone (FLONASE) 50 MCG/ACT nasal spray Place 2 sprays into both nostrils daily as needed for rhinitis., Until Discontinued, Historical Med    losartan (COZAAR) 100 MG tablet Take 1 tablet (100 mg total) by mouth daily., Starting 11/11/2015, Until Discontinued, Normal    montelukast (SINGULAIR) 10 MG tablet Take 1 tablet (10 mg total) by mouth at bedtime., Starting 11/11/2015, Until Discontinued, Normal    omeprazole (PRILOSEC) 40 MG capsule Take 40 mg by mouth daily., Until Discontinued, Historical Med    rosuvastatin (CRESTOR) 5 MG tablet Take  1 tablet (5 mg total) by mouth daily., Starting 08/11/2015, Until Discontinued, Normal      STOP taking these medications     cloNIDine (CATAPRES) 0.1 MG tablet          DISCHARGE INSTRUCTIONS:   DIET:  Cardiac diet  DISCHARGE CONDITION:  Stable  ACTIVITY:  Activity as tolerated  OXYGEN:  Home Oxygen: No.   Oxygen Delivery: room air  DISCHARGE LOCATION:  home   If you  experience worsening of your admission symptoms, develop shortness of breath, life threatening emergency, suicidal or homicidal thoughts you must seek medical attention immediately by calling 911 or calling your MD immediately  if symptoms less severe.  You Must read complete instructions/literature along with all the possible adverse reactions/side effects for all the Medicines you take and that have been prescribed to you. Take any new Medicines after you have completely understood and accpet all the possible adverse reactions/side effects.   Please note  You were cared for by a hospitalist during your hospital stay. If you have any questions about your discharge medications or the care you received while you were in the hospital after you are discharged, you can call the unit and asked to speak with the hospitalist on call if the hospitalist that took care of you is not available. Once you are discharged, your primary care physician will handle any further medical issues. Please note that NO REFILLS for any discharge medications will be authorized once you are discharged, as it is imperative that you return to your primary care physician (or establish a relationship with a primary care physician if you do not have one) for your aftercare needs so that they can reassess your need for medications and monitor your lab values.     Today   No chest pain, shortness of breath, palpitations. Feels much better and wants to go home.   VITAL SIGNS:  Blood pressure 144/77, pulse 66, temperature 97.6 F (36.4 C), temperature source Oral, resp. rate 13, height '5\' 6"'$  (1.676 m), weight 86.183 kg (190 lb), SpO2 100 %.  I/O:   Intake/Output Summary (Last 24 hours) at 11/25/15 1635 Last data filed at 11/25/15 1300  Gross per 24 hour  Intake 861.42 ml  Output   2100 ml  Net -1238.58 ml    PHYSICAL EXAMINATION:  GENERAL:  55 y.o.-year-old patient lying in the bed with no acute distress.  EYES: Pupils  equal, round, reactive to light and accommodation. No scleral icterus. Extraocular muscles intact.  HEENT: Head atraumatic, normocephalic. Oropharynx and nasopharynx clear.  NECK:  Supple, no jugular venous distention. No thyroid enlargement, no tenderness.  LUNGS: Normal breath sounds bilaterally, no wheezing, rales,rhonchi. No use of accessory muscles of respiration.  CARDIOVASCULAR: S1, S2 normal. No murmurs, rubs, or gallops.  ABDOMEN: Soft, non-tender, non-distended. Bowel sounds present. No organomegaly or mass.  EXTREMITIES: No pedal edema, cyanosis, or clubbing.  NEUROLOGIC: Cranial nerves II through XII are intact. No focal motor or sensory defecits b/l.  PSYCHIATRIC: The patient is alert and oriented x 3. Good affect.  SKIN: No obvious rash, lesion, or ulcer.   DATA REVIEW:   CBC  Recent Labs Lab 11/25/15 0658  WBC 5.9  HGB 12.3  HCT 36.7  PLT 224    Chemistries   Recent Labs Lab 11/24/15 1529 11/25/15 0658  NA 134* 133*  K 3.6 3.9  CL 105 104  CO2 20* 22  GLUCOSE 213* 118*  BUN 12 14  CREATININE 0.82 0.68  CALCIUM 9.5 9.1  AST 23  --   ALT 23  --   ALKPHOS 77  --   BILITOT 0.6  --     Cardiac Enzymes  Recent Labs Lab 11/25/15 0050  TROPONINI <0.03    Microbiology Results  Results for orders placed or performed during the hospital encounter of 11/24/15  MRSA PCR Screening     Status: None   Collection Time: 11/24/15  6:37 PM  Result Value Ref Range Status   MRSA by PCR NEGATIVE NEGATIVE Final    Comment:        The GeneXpert MRSA Assay (FDA approved for NASAL specimens only), is one component of a comprehensive MRSA colonization surveillance program. It is not intended to diagnose MRSA infection nor to guide or monitor treatment for MRSA infections.     RADIOLOGY:  Dg Chest Portable 1 View  11/24/2015  CLINICAL DATA:  55 year old female with history of tachycardia and possible acute myocardial infarction today by EKG. Shortness of  breath. EXAM: PORTABLE CHEST 1 VIEW COMPARISON:  Chest x-ray a 04/27/2012. FINDINGS: Lung volumes are normal. No consolidative airspace disease. No pleural effusions. No pneumothorax. No pulmonary nodule or mass noted. Pulmonary vasculature and the cardiomediastinal silhouette are within normal limits. Atherosclerosis in the thoracic aorta. Transcutaneous defibrillator pads project over the left hemithorax. IMPRESSION: 1.  No radiographic evidence of acute cardiopulmonary disease. 2. Atherosclerosis. Electronically Signed   By: Vinnie Langton M.D.   On: 11/24/2015 16:22      Management plans discussed with the patient, family and they are in agreement.  CODE STATUS:     Code Status Orders        Start     Ordered   11/24/15 1842  Full code   Continuous     11/24/15 1841    Code Status History    Date Active Date Inactive Code Status Order ID Comments User Context   This patient has a current code status but no historical code status.      TOTAL TIME TAKING CARE OF THIS PATIENT: 40 minutes.    Henreitta Leber M.D on 11/25/2015 at 4:35 PM  Between 7am to 6pm - Pager - [phone]  After 6pm go to [email or web address] - password [email or web address] [facility]  [facility]  Office  [phone]  CC: Primary care physician; Eliezer Lofts, MD

## 2015-11-27 ENCOUNTER — Telehealth: Payer: Self-pay | Admitting: *Deleted

## 2015-11-27 NOTE — Telephone Encounter (Signed)
Pt called back, states her BP is 186/87. States when she took the Clonidine, this morning at 10, it went down 150/76, then it went back up. States since then she has taken the Metoprolol

## 2015-11-27 NOTE — Telephone Encounter (Signed)
Metoprolol is actually twice a day (12 hour pill) Would stay on previous meds, clonidine and losartan, Add metoprolol May need to take something if she has headache (tylenol/IBP)

## 2015-11-27 NOTE — Telephone Encounter (Signed)
Spoke w/ pt.  She reports that her clonidine was d/c'd in the hospital and she was started on metoprolol 25 mg daily. She has had a HA since 6 pm last night, she checked her BP then and this am, has been consistently elevated. Advised her that I spoke w/ Dr. Rockey Situ and he recommends that she resume the clonidine 0.1 mg until her BP comes down.  Advised her to continue to monitor BP and only take 1/2 clonidine if needed. She is agreeable and will call back this afternoon if her readings do not improve.

## 2015-11-27 NOTE — Telephone Encounter (Signed)
Pt is calling stating she is home now was released from hospital Tuesday  While being in there they changed her medications and now she is not able to keep her BP down Yesterday is was 201/114 and this morning it is 220/107  She has a bad headache  Please advise.

## 2015-11-28 NOTE — Telephone Encounter (Signed)
Left message for pt to call back  °

## 2015-11-28 NOTE — Telephone Encounter (Addendum)
Spoke w/ pt.  Advised her of Dr. Donivan Scull recommendation.  She is agreeable and will call back if her #s do not improve. She states that she does not have a HA until her BP has been elevated for some time.

## 2015-12-01 ENCOUNTER — Encounter: Payer: Self-pay | Admitting: Physician Assistant

## 2015-12-01 NOTE — Progress Notes (Signed)
Cardiology Office Note Date:  12/02/2015  Patient ID:  [name], [name] [birth year], MRN [id number] PCP:  Amanda Lofts, MD  Cardiologist:  Dr. Rockey Situ, MD    Chief Complaint: Hospital follow up for new onset Afib  History of Present Illness: Amanda Estrada is a 55 y.o. female with history of 55 y.o. female with h/o possible atrial tachycardia with tachy-palpitations previously on propranolol, HTN, asthma, anxiety, obesity and recently diagnosed Afib who presents for hospital follow up.    She reports having previous episodes of of rare tachy-palpitations, sometimes lasting for hours before self resolving. She was previously on propranolol prn, though only took this once and self discontinued as this dropped her HR to the 60's and she did not like the way she felt with a HR in the 60s. She apparently has these tachy-palpitations about 4 times per year, usually while on vacation.   Upon arriving at work on 1/16 she received the unfortunate news about a prior co-worker that attempted suicide. This stressed her out considerably. Shortly after this she developed tachy-palpitations and SOB. She came into our office for an ECG which showed Afib with RVR with a HR of 154. Her BP was 221/135. She denied any chest pain, but was SOB. She was taken to the ED. Cardiac enzymes were negative, K+ 3.6, CXR negative. She converted to sinus rhythm on diltiazem and metoprolol. Echo showed EF 55-60%, no RWMA, LA normal in size, PASP normal. She was started on Eliquis for at least 4 weeks, as well as Lopressor.   She has not noticed any further tachy-palpitations since her hospital discharge. She has noted increased BP into the 937J to 696V systolic. She was started back on clonidine by phone note, initially qhs, then q PM. BP still elevated in the afternoon. She cannot tolerate titration of her clonidine in the afternoon given somnolence. She self discontinued her Eliquis for a short while as she thought this  was causing diarrhea. She then restarted this taking once daily only. She continues to take this once daily only at this time. She does not want to take Lopressor any longer as she feels like this is leading to her diarrhea. She also notes a history of snoring and daytime sleepiness. No prior sleep studies.    Past Medical History  Diagnosis Date  . Allergy   . Anxiety   . Hypertension   . Sinusitis 2011    Recurrent per Dr. Tami Estrada with [facility]  . Asthma   . PAF (paroxysmal atrial fibrillation) ([facility])     a. CHADS2VASc = 2 (HTN, sex category)  . History of echocardiogram     a. 11/2015: EF 55-60%, no RWMA, nl LV diastolic fxn, PASP nl  . Obesity     Past Surgical History  Procedure Laterality Date  . Cesarean section  1985    Due to incr BP  . Nsvd  1989    VBAC  . Cervical discectomy  2001    C5/6  . Renal artery [place]  02/2007    [place] arteries but no stenosis  . Bunionectomy  11/2007    Right foot, 2 hammer-toes and tendon replacement (Amanda Estrada)  . Endometrial biopsy  03/27/2008    B9 endometrium w/breakdown changes (Amanda Estrada)  . US transvaginal pelvic modified  04/09/2008    Normal    Current Outpatient Prescriptions  Medication Sig Dispense Refill  . albuterol (PROVENTIL HFA;VENTOLIN HFA) 108 (90 Base) MCG/ACT inhaler Inhale 2 puffs into  the lungs every 6 (six) hours as needed for wheezing or shortness of breath.    Marland Kitchen apixaban (ELIQUIS) 5 MG TABS tablet Take 1 tablet (5 mg total) by mouth 2 (two) times daily. 60 tablet 3  . cetirizine (ZYRTEC) 10 MG tablet Take 10 mg by mouth daily.    Marland Kitchen diltiazem (CARDIZEM CD) 120 MG 24 hr capsule Take 1 capsule (120 mg total) by mouth daily. 30 capsule 3  . fluticasone (FLONASE) 50 MCG/ACT nasal spray Place 2 sprays into both nostrils daily as needed for rhinitis.    . hydrALAZINE (APRESOLINE) 25 MG tablet Take 1 tablet (25 mg total) by mouth 3 (three) times daily. 90 tablet 3  . losartan (COZAAR) 100 MG tablet Take 1  tablet (100 mg total) by mouth daily. 90 tablet 3  . montelukast (SINGULAIR) 10 MG tablet Take 1 tablet (10 mg total) by mouth at bedtime. 90 tablet 3  . omeprazole (PRILOSEC) 40 MG capsule Take 40 mg by mouth daily.    . rosuvastatin (CRESTOR) 20 MG tablet Take 1 tablet (20 mg total) by mouth daily. 90 tablet 3   No current facility-administered medications for this visit.    Allergies:   Review of patient's allergies indicates no known allergies.   Social History:  The patient  reports that she has never smoked. She has never used smokeless tobacco. She reports that she drinks about 8.4 oz of alcohol per week. She reports that she does not use illicit drugs.   Family History:  The patient's family history includes COPD in her father and sister; Cancer in her father and mother; Colon cancer (age of onset: 33) in her mother; Colon cancer (age of onset: 2) in her father; Colon polyps in her brother, sister, sister, and sister; Heart disease in her father; Hypertension in her brother, father, and sister; Pneumonia in her father. There is no history of Esophageal cancer, Rectal cancer, or Stomach cancer.  ROS:   Review of Systems  Constitutional: Positive for malaise/fatigue. Negative for fever, chills, weight loss and diaphoresis.  HENT: Negative for congestion.   Eyes: Negative for discharge and redness.  Respiratory: Negative for cough, hemoptysis, sputum production, shortness of breath and wheezing.   Cardiovascular: Negative for chest pain, palpitations, orthopnea, claudication, leg swelling and PND.  Gastrointestinal: Positive for diarrhea. Negative for heartburn, nausea, vomiting, abdominal pain, constipation, blood in stool and melena.  Genitourinary: Negative for hematuria.  Musculoskeletal: Negative for myalgias and falls.  Skin: Negative for rash.  Neurological: Negative for dizziness, sensory change, speech change, focal weakness, loss of consciousness and weakness.    Endo/Heme/Allergies: Does not bruise/bleed easily.  Psychiatric/Behavioral: Negative for substance abuse. The patient is nervous/anxious.   All other systems reviewed and are negative.     PHYSICAL EXAM:  VS:  BP 128/68 mmHg  Pulse 68  Ht '5\' 6"'$  (1.676 m)  Wt 190 lb (86.183 kg)  BMI 30.68 kg/m2 BMI: Body mass index is 30.68 kg/(m^2). Well nourished, well developed, in no acute distress HEENT: normocephalic, atraumatic Neck: no JVD, carotid bruits or masses Cardiac:  normal S1, S2; RRR; no murmurs, rubs, or gallops Lungs:  clear to auscultation bilaterally, no wheezing, rhonchi or rales Abd: obese, soft, nontender, no hepatomegaly, + BS MS: no deformity or atrophy Ext: no edema Skin: warm and dry, no rash Neuro:  moves all extremities spontaneously, no focal abnormalities noted, follows commands Psych: euthymic mood, full affect   EKG:  Was ordered today. Shows NSR, 68 bpm,  no st/t changes  Recent Labs: 11/24/2015: ALT 23; TSH 1.653 11/25/2015: BUN 14; Creatinine, Ser 0.68; Hemoglobin 12.3; Platelets 224; Potassium 3.9; Sodium 133*  11/05/2015: Cholesterol 179; HDL 67.60; LDL Cholesterol 82; Total CHOL/HDL Ratio 3; Triglycerides 150.0*; VLDL 30.0   Estimated Creatinine Clearance: 87.9 mL/min (by C-G formula based on Cr of 0.68).   Wt Readings from Last 3 Encounters:  12/02/15 190 lb (86.183 kg)  11/24/15 190 lb (86.183 kg)  11/24/15 198 lb 4 oz (89.926 kg)     Other studies reviewed: Additional studies/records reviewed today include: summarized above  ASSESSMENT AND PLAN:  1. PAF: -Currently in sinus rhythm with HR of 68 bpm -Change metoprolol to Cardizem CD 120 mg daily with the plan to titrate upwards as BP and HR allow -Advised patient to take Eliquis 5 mg bid, not once daily, this does nothing for her -Will leave long term anticoagulation decision up to patient's primary cardiologist  -CHADS2VASc 2(HTN, sex category)  2. HTN: -Poorly controlled with current  regimen -Combination of Lopressor and clonidine may be suppressing her HR -Long term plan could be to titrate Cardizem without clonidine on board -Will discontinue clonidine at this time as her breakthrough elevated BP is around 2PM and she cannot titrate up her clonidine to address this at that time given somnolence at work. It is less than ideal to add yet another antihypertensive to her regimen -Add hydralazine 25 mg tid -As/if she is able to titrate Cardizem could possibly discontinue hydralazine  -Continue losartan 100 mg daily as this also aids with her Afib  3. Obesity/OSA: -Referral to pulmonology for sleep study -This will also help with her Afib and HTN  Disposition: F/u with Dr. Rockey Situ, MD in one month  Current medicines are reviewed at length with the patient today.  The patient did not have any concerns regarding medicines.  Melvern Banker PA-C 12/02/2015 11:59 AM     [facility] [address] [place] [address] [place], [place] [address] [phone]

## 2015-12-02 ENCOUNTER — Encounter: Payer: Self-pay | Admitting: Physician Assistant

## 2015-12-02 ENCOUNTER — Ambulatory Visit (INDEPENDENT_AMBULATORY_CARE_PROVIDER_SITE_OTHER): Payer: 59 | Admitting: Physician Assistant

## 2015-12-02 ENCOUNTER — Other Ambulatory Visit: Payer: Self-pay

## 2015-12-02 VITALS — BP 128/68 | HR 68 | Ht 66.0 in | Wt 190.0 lb

## 2015-12-02 DIAGNOSIS — G479 Sleep disorder, unspecified: Secondary | ICD-10-CM

## 2015-12-02 DIAGNOSIS — I4891 Unspecified atrial fibrillation: Secondary | ICD-10-CM

## 2015-12-02 DIAGNOSIS — I480 Paroxysmal atrial fibrillation: Secondary | ICD-10-CM | POA: Diagnosis not present

## 2015-12-02 DIAGNOSIS — I10 Essential (primary) hypertension: Secondary | ICD-10-CM | POA: Diagnosis not present

## 2015-12-02 MED ORDER — DILTIAZEM HCL 120 MG PO TABS: 120.0000 mg | ORAL_TABLET | Freq: Every day | ORAL | Status: DC

## 2015-12-02 MED ORDER — APIXABAN 5 MG PO TABS: 5.0000 mg | ORAL_TABLET | Freq: Two times a day (BID) | ORAL | Status: DC

## 2015-12-02 MED ORDER — DILTIAZEM HCL ER COATED BEADS 120 MG PO CP24: 120.0000 mg | ORAL_CAPSULE | Freq: Every day | ORAL | Status: DC

## 2015-12-02 MED ORDER — HYDRALAZINE HCL 25 MG PO TABS: 25.0000 mg | ORAL_TABLET | Freq: Three times a day (TID) | ORAL | Status: DC

## 2015-12-02 NOTE — Patient Instructions (Signed)
Medication Instructions:  Your physician has recommended you make the following change in your medication:  STOP taking lopressor STOP taking clonidine START taking hydralazine '25mg'$  three times daily START taking cardizem '120mg'$  once a day INCREASE eliquis to '5mg'$  twice a day   Labwork: BMET, CBC, Mag today  Testing/Procedures: none  Follow-Up: Your physician recommends that you schedule a follow-up appointment in: one month with Dr. Rockey Situ   Any Other Special Instructions Will Be Listed Below (If Applicable). Referral to pulmonary for sleep study    If you need a refill on your cardiac medications before your next appointment, please call your pharmacy.

## 2015-12-03 ENCOUNTER — Other Ambulatory Visit: Payer: Self-pay

## 2015-12-03 ENCOUNTER — Ambulatory Visit (INDEPENDENT_AMBULATORY_CARE_PROVIDER_SITE_OTHER): Payer: 59

## 2015-12-03 VITALS — BP 178/98 | HR 82 | Wt 189.5 lb

## 2015-12-03 DIAGNOSIS — I4891 Unspecified atrial fibrillation: Secondary | ICD-10-CM

## 2015-12-03 DIAGNOSIS — I10 Essential (primary) hypertension: Secondary | ICD-10-CM

## 2015-12-03 LAB — CBC
Hematocrit: 37.8 % (ref 34.0–46.6)
Hemoglobin: 13.1 g/dL (ref 11.1–15.9)
MCH: 30.9 pg (ref 26.6–33.0)
MCHC: 34.7 g/dL (ref 31.5–35.7)
MCV: 89 fL (ref 79–97)
PLATELETS: 271 10*3/uL (ref 150–379)
RBC: 4.24 x10E6/uL (ref 3.77–5.28)
RDW: 12.6 % (ref 12.3–15.4)
WBC: 4.9 10*3/uL (ref 3.4–10.8)

## 2015-12-03 LAB — BASIC METABOLIC PANEL
BUN / CREAT RATIO: 18 (ref 9–23)
BUN: 12 mg/dL (ref 6–24)
CHLORIDE: 97 mmol/L (ref 96–106)
CO2: 22 mmol/L (ref 18–29)
Calcium: 9.6 mg/dL (ref 8.7–10.2)
Creatinine, Ser: 0.67 mg/dL (ref 0.57–1.00)
GFR calc Af Amer: 114 mL/min/{1.73_m2} (ref >59)
GFR calc non Af Amer: 99 mL/min/{1.73_m2} (ref >59)
GLUCOSE: 97 mg/dL (ref 65–99)
Potassium: 4.8 mmol/L (ref 3.5–5.2)
SODIUM: 134 mmol/L (ref 134–144)

## 2015-12-03 LAB — MAGNESIUM: Magnesium: 2.1 mg/dL (ref 1.6–2.3)

## 2015-12-03 MED ORDER — HYDRALAZINE HCL 25 MG PO TABS: 25.0000 mg | ORAL_TABLET | Freq: Three times a day (TID) | ORAL | Status: DC

## 2015-12-03 MED ORDER — HYDRALAZINE HCL 50 MG PO TABS: 50.0000 mg | ORAL_TABLET | Freq: Three times a day (TID) | ORAL | Status: DC

## 2015-12-03 NOTE — Progress Notes (Signed)
1.) Reason for visit: EKG  2.) Name of MD requesting visit: Christell Faith, PA  3.) H&P: Spoke w/ pt earlier today to review lab results.  She reported at that time that BP continues to be elevated and she had a bad night w/ elevated HR, as well.  Ryan requested that pt come in for EKG.  4.) ROS related to problem: Pt's BP elevated today at 178/98, she does report a HA, but otherwise asymptomatic.  EKG shows NSR, HR 82.    5.) Assessment and plan per MD: Per Thurmond Butts, have pt increase hydralazine to 50 mg TID.  Advised her to continue to monitor BP & HR and to call if her readings do not improve.  Pt provided w/ educational material on both of her diagnoses.  She will keep her f/u as planned.

## 2015-12-03 NOTE — Patient Instructions (Addendum)
Medication Instructions:  Please increase hydralazine to 50 mg three times daily  Labwork: None  Testing/Procedures: None  Follow-Up: Call or return to clinic prn if these symptoms worsen or fail to improve as anticipated.  Any Other Special Instructions Will Be Listed Below:  Continue to monitor your blood pressure at home and call if it remains elevated  If you need a refill on your cardiac medications before your next appointment, please call your pharmacy.  Hypertension Hypertension, commonly called high blood pressure, is when the force of blood pumping through your arteries is too strong. Your arteries are the blood vessels that carry blood from your heart throughout your body. A blood pressure reading consists of a higher number over a lower number, such as 110/72. The higher number (systolic) is the pressure inside your arteries when your heart pumps. The lower number (diastolic) is the pressure inside your arteries when your heart relaxes. Ideally you want your blood pressure below 120/80. Hypertension forces your heart to work harder to pump blood. Your arteries may become narrow or stiff. Having untreated or uncontrolled hypertension can cause heart attack, stroke, kidney disease, and other problems. RISK FACTORS Some risk factors for high blood pressure are controllable. Others are not.  Risk factors you cannot control include:   Race. You may be at higher risk if you are African American.  Age. Risk increases with age.  Gender. Men are at higher risk than women before age 9 years. After age 72, women are at higher risk than men. Risk factors you can control include:  Not getting enough exercise or physical activity.  Being overweight.  Getting too much fat, sugar, calories, or salt in your diet.  Drinking too much alcohol. SIGNS AND SYMPTOMS Hypertension does not usually cause signs or symptoms. Extremely high blood pressure (hypertensive crisis) may cause  headache, anxiety, shortness of breath, and nosebleed. DIAGNOSIS To check if you have hypertension, your health care provider will measure your blood pressure while you are seated, with your arm held at the level of your heart. It should be measured at least twice using the same arm. Certain conditions can cause a difference in blood pressure between your right and left arms. A blood pressure reading that is higher than normal on one occasion does not mean that you need treatment. If it is not clear whether you have high blood pressure, you may be asked to return on a different day to have your blood pressure checked again. Or, you may be asked to monitor your blood pressure at home for 1 or more weeks. TREATMENT Treating high blood pressure includes making lifestyle changes and possibly taking medicine. Living a healthy lifestyle can help lower high blood pressure. You may need to change some of your habits. Lifestyle changes may include:  Following the DASH diet. This diet is high in fruits, vegetables, and whole grains. It is low in salt, red meat, and added sugars.  Keep your sodium intake below 2,300 mg per day.  Getting at least 30-45 minutes of aerobic exercise at least 4 times per week.  Losing weight if necessary.  Not smoking.  Limiting alcoholic beverages.  Learning ways to reduce stress. Your health care provider may prescribe medicine if lifestyle changes are not enough to get your blood pressure under control, and if one of the following is true:  You are 67-3 years of age and your systolic blood pressure is above 140.  You are 59 years of age or older, and  your systolic blood pressure is above 150.  Your diastolic blood pressure is above 90.  You have diabetes, and your systolic blood pressure is over 591 or your diastolic blood pressure is over 90.  You have kidney disease and your blood pressure is above 140/90.  You have heart disease and your blood pressure is  above 140/90. Your personal target blood pressure may vary depending on your medical conditions, your age, and other factors. HOME CARE INSTRUCTIONS  Have your blood pressure rechecked as directed by your health care provider.   Take medicines only as directed by your health care provider. Follow the directions carefully. Blood pressure medicines must be taken as prescribed. The medicine does not work as well when you skip doses. Skipping doses also puts you at risk for problems.  Do not smoke.   Monitor your blood pressure at home as directed by your health care provider. SEEK MEDICAL CARE IF:   You think you are having a reaction to medicines taken.  You have recurrent headaches or feel dizzy.  You have swelling in your ankles.  You have trouble with your vision. SEEK IMMEDIATE MEDICAL CARE IF:  You develop a severe headache or confusion.  You have unusual weakness, numbness, or feel faint.  You have severe chest or abdominal pain.  You vomit repeatedly.  You have trouble breathing. MAKE SURE YOU:   Understand these instructions.  Will watch your condition.  Will get help right away if you are not doing well or get worse.   This information is not intended to replace advice given to you by your health care provider. Make sure you discuss any questions you have with your health care provider.   Document Released: 10/25/2005 Document Revised: 03/11/2015 Document Reviewed: 08/17/2013 Elsevier Interactive Patient Education 2016 Else[employer] Atrial Fibrillation Atrial fibrillation is a type of irregular or rapid heartbeat (arrhythmia). In atrial fibrillation, the heart quivers continuously in a chaotic pattern. This occurs when parts of the heart receive disorganized signals that make the heart unable to pump blood normally. This can increase the risk for stroke, heart failure, and other heart-related conditions. There are different types of atrial fibrillation,  including:  Paroxysmal atrial fibrillation. This type starts suddenly, and it usually stops on its own shortly after it starts.  Persistent atrial fibrillation. This type often lasts longer than a week. It may stop on its own or with treatment.  Long-lasting persistent atrial fibrillation. This type lasts longer than 12 months.  Permanent atrial fibrillation. This type does not go away. Talk with your health care provider to learn about the type of atrial fibrillation that you have. CAUSES This condition is caused by some heart-related conditions or procedures, including:  A heart attack.  Coronary artery disease.  Heart failure.  Heart valve conditions.  High blood pressure.  Inflammation of the sac that surrounds the heart (pericarditis).  Heart surgery.  Certain heart rhythm disorders, such as Wolf-Parkinson-White syndrome. Other causes include:  Pneumonia.  Obstructive sleep apnea.  Blockage of an artery in the lungs (pulmonary embolism, or PE).  Lung cancer.  Chronic lung disease.  Thyroid problems, especially if the thyroid is overactive (hyperthyroidism).  Caffeine.  Excessive alcohol use or illegal drug use.  Use of some medicines, including certain decongestants and diet pills. Sometimes, the cause cannot be found. RISK FACTORS This condition is more likely to develop in:  People who are older in age.  People who smoke.  People who have diabetes mellitus.  People who are overweight (obese).  Athletes who exercise vigorously. SYMPTOMS Symptoms of this condition include:  A feeling that your heart is beating rapidly or irregularly.  A feeling of discomfort or pain in your chest.  Shortness of breath.  Sudden light-headedness or weakness.  Getting tired easily during exercise. In some cases, there are no symptoms. DIAGNOSIS Your health care provider may be able to detect atrial fibrillation when taking your pulse. If detected, this  condition may be diagnosed with:  An electrocardiogram (ECG).  A Holter monitor test that records your heartbeat patterns over a 24-hour period.  Transthoracic echocardiogram (TTE) to evaluate how blood flows through your heart.  Transesophageal echocardiogram (TEE) to view more detailed images of your heart.  A stress test.  Imaging tests, such as a CT scan or chest X-ray.  Blood tests. TREATMENT The main goals of treatment are to prevent blood clots from forming and to keep your heart beating at a normal rate and rhythm. The type of treatment that you receive depends on many factors, such as your underlying medical conditions and how you feel when you are experiencing atrial fibrillation. This condition may be treated with:  Medicine to slow down the heart rate, bring the heart's rhythm back to normal, or prevent clots from forming.  Electrical cardioversion. This is a procedure that resets your heart's rhythm by delivering a controlled, low-energy shock to the heart through your skin.  Different types of ablation, such as catheter ablation, catheter ablation with pacemaker, or surgical ablation. These procedures destroy the heart tissues that send abnormal signals. When the pacemaker is used, it is placed under your skin to help your heart beat in a regular rhythm. HOME CARE INSTRUCTIONS  Take over-the counter and prescription medicines only as told by your health care provider.  If your health care provider prescribed a blood-thinning medicine (anticoagulant), take it exactly as told. Taking too much blood-thinning medicine can cause bleeding. If you do not take enough blood-thinning medicine, you will not have the protection that you need against stroke and other problems.  Do not use tobacco products, including cigarettes, chewing tobacco, and e-cigarettes. If you need help quitting, ask your health care provider.  If you have obstructive sleep apnea, manage your condition as  told by your health care provider.  Do not drink alcohol.  Do not drink beverages that contain caffeine, such as coffee, soda, and tea.  Maintain a healthy weight. Do not use diet pills unless your health care provider approves. Diet pills may make heart problems worse.  Follow diet instructions as told by your health care provider.  Exercise regularly as told by your health care provider.  Keep all follow-up visits as told by your health care provider. This is important. PREVENTION  Avoid drinking beverages that contain caffeine or alcohol.  Avoid certain medicines, especially medicines that are used for breathing problems.  Avoid certain herbs and herbal medicines, such as those that contain ephedra or ginseng.  Do not use illegal drugs, such as cocaine and amphetamines.  Do not smoke.  Manage your high blood pressure. SEEK MEDICAL CARE IF:  You notice a change in the rate, rhythm, or strength of your heartbeat.  You are taking an anticoagulant and you notice increased bruising.  You tire more easily when you exercise or exert yourself. SEEK IMMEDIATE MEDICAL CARE IF:  You have chest pain, abdominal pain, sweating, or weakness.  You feel nauseous.  You notice blood in your vomit, bowel movement,  or urine.  You have shortness of breath.  You suddenly have swollen feet and ankles.  You feel dizzy.  You have sudden weakness or numbness of the face, arm, or leg, especially on one side of the body.  You have trouble speaking, trouble understanding, or both (aphasia).  Your face or your eyelid droops on one side. These symptoms may represent a serious problem that is an emergency. Do not wait to see if the symptoms will go away. Get medical help right away. Call your local emergency services (911 in the [place] Do not drive yourself to the hospital.   This information is not intended to replace advice given to you by your health care provider. Make sure you discuss  any questions you have with your health care provider.   Document Released: 10/25/2005 Document Revised: 07/16/2015 Document Reviewed: 02/19/2015 Elsevier Interactive Patient Education [employer].

## 2015-12-04 NOTE — Progress Notes (Signed)
Agree with the detailed RN note. She will continue her losartan 100 mg daily and Cardizem CD 120 mg daily in addition to increasing the hydralazine t 50 mg tid. Follow up as planned.   Christell Faith, PA-C 12/04/2015 4:02 PM

## 2015-12-12 ENCOUNTER — Ambulatory Visit (INDEPENDENT_AMBULATORY_CARE_PROVIDER_SITE_OTHER): Payer: 59 | Admitting: Internal Medicine

## 2015-12-12 ENCOUNTER — Encounter: Payer: Self-pay | Admitting: Internal Medicine

## 2015-12-12 VITALS — BP 148/88 | HR 70 | Ht 66.0 in | Wt 191.0 lb

## 2015-12-12 DIAGNOSIS — J45909 Unspecified asthma, uncomplicated: Secondary | ICD-10-CM | POA: Diagnosis not present

## 2015-12-12 DIAGNOSIS — G4733 Obstructive sleep apnea (adult) (pediatric): Secondary | ICD-10-CM

## 2015-12-12 NOTE — Progress Notes (Signed)
[facility] Consultation      Date: 12/12/2015,   MRN# [id number] Amanda Estrada [birth year] Code Status:  Code Status History    Date Active Date Inactive Code Status Order ID Comments User Context   11/24/2015  6:41 PM 11/25/2015  6:19 PM Full Code [id number]  Vaughan Basta, MD Inpatient     Hosp day:'@LENGTHOFSTAYDAYS'$ @ Referring MD: '@ATDPROV'$ @     PCP:      AdmissionWeight: 191 lb (86.637 kg)                 CurrentWeight: 191 lb (86.637 kg) Amanda Estrada is a 56 y.o. old female seen in consultation for sleepiness at the request of Dr. Rockey Situ     CHIEF COMPLAINT:   Sleepiness, can't breathe  at night   HISTORY OF PRESENT ILLNESS   55 yo white female with h/o AFIB with HTN seen today for evaluation for sleepiness Her symptoms have been going on for a long time approx 1 year Patient states that she has excessive daytime sleepiness Patient states that she chokes and gasps for air at night time, she has restless sleep, and tired during the day Patient has no signs of infection She is nonsmoker She has gain 10 pounds in last 2 years She has been exposed to second hand smoke in her childhood She is Public house manager for bank She has been dx with asthma on albuterol as needed, on singulair    PAST MEDICAL HISTORY   Past Medical History  Diagnosis Date  . Allergy   . Anxiety   . Hypertension   . Sinusitis 2011    Recurrent per Dr. Tami Ribas with [facility]  . Asthma   . PAF (paroxysmal atrial fibrillation) ([place])     a. CHADS2VASc = 2 (HTN, sex category)  . History of echocardiogram     a. 11/2015: EF 55-60%, no RWMA, nl LV diastolic fxn, PASP nl  . Obesity      SURGICAL HISTORY   Past Surgical History  Procedure Laterality Date  . Cesarean section  1985    Due to incr BP  . Nsvd  1989    VBAC  . Cervical discectomy  2001    C5/6  . Renal artery [place]  02/2007    [place] arteries but no stenosis  . Bunionectomy  11/2007   Right foot, 2 hammer-toes and tendon replacement (Dr. Paulla Dolly)  . Endometrial biopsy  03/27/2008    B9 endometrium w/breakdown changes (Dr, Kinscius)  . US transvaginal pelvic modified  04/09/2008    Normal     FAMILY HISTORY   Family History  Problem Relation Age of Onset  . Cancer Mother     Colon, partial colectomy, melanoma x 2  . Colon cancer Mother 26  . Heart disease Father     CHF  . Hypertension Father   . Cancer Father     Colon  . Pneumonia Father   . Colon cancer Father 22  . Colon polyps Sister   . Hypertension Brother   . Colon polyps Brother   . Hypertension Sister   . Colon polyps Sister   . Colon polyps Sister   . Esophageal cancer Neg Hx   . Rectal cancer Neg Hx   . Stomach cancer Neg Hx      SOCIAL HISTORY   Social History  Substance Use Topics  . Smoking status: Never Smoker   . Smokeless tobacco: Never Used  . Alcohol Use: 8.4 oz/week  14 Glasses of wine per week     Comment: 2 glasses of wine every day     MEDICATIONS    Home Medication:  Current Outpatient Rx  Name  Route  Sig  Dispense  Refill  . albuterol (PROVENTIL HFA;VENTOLIN HFA) 108 (90 Base) MCG/ACT inhaler   Inhalation   Inhale 2 puffs into the lungs every 6 (six) hours as needed for wheezing or shortness of breath.         Marland Kitchen apixaban (ELIQUIS) 5 MG TABS tablet   Oral   Take 1 tablet (5 mg total) by mouth 2 (two) times daily.   60 tablet   3   . cetirizine (ZYRTEC) 10 MG tablet   Oral   Take 10 mg by mouth daily.         Marland Kitchen diltiazem (CARDIZEM CD) 120 MG 24 hr capsule   Oral   Take 1 capsule (120 mg total) by mouth daily.   30 capsule   3   . fluticasone (FLONASE) 50 MCG/ACT nasal spray   Each Nare   Place 2 sprays into both nostrils daily as needed for rhinitis.         . hydrALAZINE (APRESOLINE) 50 MG tablet   Oral   Take 1 tablet (50 mg total) by mouth 3 (three) times daily.   90 tablet   6   . losartan (COZAAR) 100 MG tablet   Oral   Take 1  tablet (100 mg total) by mouth daily.   90 tablet   3     Name brand only   . montelukast (SINGULAIR) 10 MG tablet   Oral   Take 1 tablet (10 mg total) by mouth at bedtime.   90 tablet   3   . omeprazole (PRILOSEC) 40 MG capsule   Oral   Take 40 mg by mouth daily.         . rosuvastatin (CRESTOR) 20 MG tablet   Oral   Take 1 tablet (20 mg total) by mouth daily.   90 tablet   3     Current Medication:  Current outpatient prescriptions:  .  albuterol (PROVENTIL HFA;VENTOLIN HFA) 108 (90 Base) MCG/ACT inhaler, Inhale 2 puffs into the lungs every 6 (six) hours as needed for wheezing or shortness of breath., Disp: , Rfl:  .  apixaban (ELIQUIS) 5 MG TABS tablet, Take 1 tablet (5 mg total) by mouth 2 (two) times daily., Disp: 60 tablet, Rfl: 3 .  cetirizine (ZYRTEC) 10 MG tablet, Take 10 mg by mouth daily., Disp: , Rfl:  .  diltiazem (CARDIZEM CD) 120 MG 24 hr capsule, Take 1 capsule (120 mg total) by mouth daily., Disp: 30 capsule, Rfl: 3 .  fluticasone (FLONASE) 50 MCG/ACT nasal spray, Place 2 sprays into both nostrils daily as needed for rhinitis., Disp: , Rfl:  .  hydrALAZINE (APRESOLINE) 50 MG tablet, Take 1 tablet (50 mg total) by mouth 3 (three) times daily., Disp: 90 tablet, Rfl: 6 .  losartan (COZAAR) 100 MG tablet, Take 1 tablet (100 mg total) by mouth daily., Disp: 90 tablet, Rfl: 3 .  montelukast (SINGULAIR) 10 MG tablet, Take 1 tablet (10 mg total) by mouth at bedtime., Disp: 90 tablet, Rfl: 3 .  omeprazole (PRILOSEC) 40 MG capsule, Take 40 mg by mouth daily., Disp: , Rfl:  .  rosuvastatin (CRESTOR) 20 MG tablet, Take 1 tablet (20 mg total) by mouth daily., Disp: 90 tablet, Rfl: 3    ALLERGIES  Review of patient's allergies indicates no known allergies.     REVIEW OF SYSTEMS   Review of Systems  Constitutional: Positive for malaise/fatigue. Negative for fever, chills, weight loss and diaphoresis.  HENT: Negative for congestion and hearing loss.   Eyes:  Negative for blurred vision and double vision.  Respiratory: Negative for cough, hemoptysis, sputum production, shortness of breath and wheezing.   Cardiovascular: Negative for chest pain, palpitations and orthopnea.  Gastrointestinal: Negative for heartburn, nausea, vomiting and abdominal pain.  Genitourinary: Negative for dysuria and urgency.  Musculoskeletal: Negative for myalgias, back pain and neck pain.  Skin: Negative for rash.  Neurological: Negative for dizziness, tingling, tremors, weakness and headaches.  Endo/Heme/Allergies: Does not bruise/bleed easily.  Psychiatric/Behavioral: Negative for depression, suicidal ideas and substance abuse.  All other systems reviewed and are negative.    VS: BP 148/88 mmHg  Pulse 70  Ht '5\' 6"'$  (1.676 m)  Wt 191 lb (86.637 kg)  BMI 30.84 kg/m2  SpO2 98%     PHYSICAL EXAM  Physical Exam  Constitutional: She is oriented to person, place, and time. She appears well-developed and well-nourished. No distress.  HENT:  Head: Normocephalic and atraumatic.  Mouth/Throat: No oropharyngeal exudate.  malanpatti class 4  Eyes: EOM are normal. Pupils are equal, round, and reactive to light. No scleral icterus.  Neck: Normal range of motion. Neck supple.  Cardiovascular: Normal rate, regular rhythm and normal heart sounds.   No murmur heard. Pulmonary/Chest: No stridor. No respiratory distress. She has no wheezes. She has no rales.  Abdominal: Soft. Bowel sounds are normal. She exhibits no distension. There is no tenderness. There is no rebound.  Musculoskeletal: Normal range of motion. She exhibits no edema.  Neurological: She is alert and oriented to person, place, and time. Coordination normal.  Skin: Skin is warm. She is not diaphoretic.  Psychiatric: She has a normal mood and affect.          IMAGING    Dg Chest Portable 1 View  11/24/2015  CLINICAL DATA:  55 year old female with history of tachycardia and possible acute myocardial  infarction today by EKG. Shortness of breath. EXAM: PORTABLE CHEST 1 VIEW COMPARISON:  Chest x-ray a 04/27/2012. FINDINGS: Lung volumes are normal. No consolidative airspace disease. No pleural effusions. No pneumothorax. No pulmonary nodule or mass noted. Pulmonary vasculature and the cardiomediastinal silhouette are within normal limits. Atherosclerosis in the thoracic aorta. Transcutaneous defibrillator pads project over the left hemithorax. IMPRESSION: 1.  No radiographic evidence of acute cardiopulmonary disease. 2. Atherosclerosis. Electronically Signed   By: Vinnie Langton M.D.   On: 11/24/2015 16:22       ASSESSMENT/PLAN   55 yo white female seen today for excessive daytime sleepiness with choking and gasping for air at night  with Epworth Sleep Score of 13, signs suggestive of OSA, with underlying mild intermittent  ASTHMA and uncontrolled HTN and with underlying afib.  1.will order split night sleep study 2.continue singulair 3.albuterol as needed 4.check over night pulse oximtery 5.follow up cardiology for afib  The Patient requires high complexity decision making for assessment and support, frequent evaluation and titration of therapies, application of advanced monitoring technologies and extensive interpretation of multiple databases.   Patient satisfied with Plan of action and management. All questions answered  Corrin Parker, M.D.  [facility]  Medical Director Center Director [facility] Cardio-Pulmonary Department

## 2015-12-12 NOTE — Addendum Note (Signed)
Addended by: Oscar La R on: 12/12/2015 02:00 PM   Modules accepted: Orders

## 2015-12-12 NOTE — Patient Instructions (Signed)
Sleep Apnea  Sleep apnea is a sleep disorder characterized by abnormal pauses in breathing while you sleep. When your breathing pauses, the level of oxygen in your blood decreases. This causes you to move out of deep sleep and into light sleep. As a result, your quality of sleep is poor, and the system that carries your blood throughout your body (cardiovascular system) experiences stress. If sleep apnea remains untreated, the following conditions can develop:  High blood pressure (hypertension).  Coronary artery disease.  Inability to achieve or maintain an erection (impotence).  Impairment of your thought process (cognitive dysfunction). There are three types of sleep apnea: 1. Obstructive sleep apnea--Pauses in breathing during sleep because of a blocked airway. 2. Central sleep apnea--Pauses in breathing during sleep because the area of the brain that controls your breathing does not send the correct signals to the muscles that control breathing. 3. Mixed sleep apnea--A combination of both obstructive and central sleep apnea. RISK FACTORS The following risk factors can increase your risk of developing sleep apnea:  Being overweight.  Smoking.  Having narrow passages in your nose and throat.  Being of older age.  Being female.  Alcohol use.  Sedative and tranquilizer use.  Ethnicity. Among individuals younger than 35 years, African Americans are at increased risk of sleep apnea. SYMPTOMS   Difficulty staying asleep.  Daytime sleepiness and fatigue.  Loss of energy.  Irritability.  Loud, heavy snoring.  Morning headaches.  Trouble concentrating.  Forgetfulness.  Decreased interest in sex.  Unexplained sleepiness. DIAGNOSIS  In order to diagnose sleep apnea, your caregiver will perform a physical examination. A sleep study done in the comfort of your own home may be appropriate if you are otherwise healthy. Your caregiver may also recommend that you spend the  night in a sleep lab. In the sleep lab, several monitors record information about your heart, lungs, and brain while you sleep. Your leg and arm movements and blood oxygen level are also recorded. TREATMENT The following actions may help to resolve mild sleep apnea:  Sleeping on your side.   Using a decongestant if you have nasal congestion.   Avoiding the use of depressants, including alcohol, sedatives, and narcotics.   Losing weight and modifying your diet if you are overweight. There also are devices and treatments to help open your airway:  Oral appliances. These are custom-made mouthpieces that shift your lower jaw forward and slightly open your bite. This opens your airway.  Devices that create positive airway pressure. This positive pressure "splints" your airway open to help you breathe better during sleep. The following devices create positive airway pressure:  Continuous positive airway pressure (CPAP) device. The CPAP device creates a continuous level of air pressure with an air pump. The air is delivered to your airway through a mask while you sleep. This continuous pressure keeps your airway open.  Nasal expiratory positive airway pressure (EPAP) device. The EPAP device creates positive air pressure as you exhale. The device consists of single-use valves, which are inserted into each nostril and held in place by adhesive. The valves create very little resistance when you inhale but create much more resistance when you exhale. That increased resistance creates the positive airway pressure. This positive pressure while you exhale keeps your airway open, making it easier to breath when you inhale again.  Bilevel positive airway pressure (BPAP) device. The BPAP device is used mainly in patients with central sleep apnea. This device is similar to the CPAP device because   it also uses an air pump to deliver continuous air pressure through a mask. However, with the BPAP machine, the  pressure is set at two different levels. The pressure when you exhale is lower than the pressure when you inhale.  Surgery. Typically, surgery is only done if you cannot comply with less invasive treatments or if the less invasive treatments do not improve your condition. Surgery involves removing excess tissue in your airway to create a wider passage way.   This information is not intended to replace advice given to you by your health care provider. Make sure you discuss any questions you have with your health care provider.   Document Released: 10/15/2002 Document Revised: 11/15/2014 Document Reviewed: 03/02/2012 Elsevier Interactive Patient Education 2016 Elsevier Inc.   

## 2015-12-18 ENCOUNTER — Encounter: Payer: Self-pay | Admitting: Internal Medicine

## 2015-12-19 ENCOUNTER — Encounter: Payer: Self-pay | Admitting: Cardiovascular Disease

## 2015-12-26 ENCOUNTER — Telehealth: Payer: Self-pay | Admitting: *Deleted

## 2015-12-26 DIAGNOSIS — I480 Paroxysmal atrial fibrillation: Secondary | ICD-10-CM

## 2015-12-26 DIAGNOSIS — J4530 Mild persistent asthma, uncomplicated: Secondary | ICD-10-CM

## 2015-12-26 NOTE — Telephone Encounter (Signed)
Pt informed of the need for O2. Order placed.

## 2015-12-29 NOTE — Telephone Encounter (Signed)
Spoke with Amanda Estrada at [facility] in regards to what dx will cover for O2. Waiting on call back from [facility].

## 2015-12-31 NOTE — Telephone Encounter (Signed)
Spoke with Maudie Mercury and completed the order for O2. Nothing further needed.

## 2016-01-02 ENCOUNTER — Ambulatory Visit: Payer: 59 | Admitting: Cardiovascular Disease

## 2016-01-11 ENCOUNTER — Other Ambulatory Visit: Payer: Self-pay | Admitting: Family Medicine

## 2016-01-29 ENCOUNTER — Ambulatory Visit: Payer: 59 | Attending: Pulmonary Disease

## 2016-01-29 DIAGNOSIS — I4891 Unspecified atrial fibrillation: Secondary | ICD-10-CM | POA: Diagnosis not present

## 2016-01-29 DIAGNOSIS — G4761 Periodic limb movement disorder: Secondary | ICD-10-CM | POA: Insufficient documentation

## 2016-01-29 DIAGNOSIS — G2581 Restless legs syndrome: Secondary | ICD-10-CM | POA: Diagnosis not present

## 2016-01-29 DIAGNOSIS — G4733 Obstructive sleep apnea (adult) (pediatric): Secondary | ICD-10-CM | POA: Diagnosis present

## 2016-02-09 ENCOUNTER — Telehealth: Payer: Self-pay | Admitting: Internal Medicine

## 2016-02-09 DIAGNOSIS — J4530 Mild persistent asthma, uncomplicated: Secondary | ICD-10-CM

## 2016-02-09 NOTE — Telephone Encounter (Signed)
Pt calling asking if we can put an order for someone to come and pick up the oxygen machine  For she states she is not using it.

## 2016-02-09 NOTE — Telephone Encounter (Signed)
Placed order to have O2 picked up. Pt states she is not using.   FYI

## 2016-02-19 ENCOUNTER — Telehealth: Payer: Self-pay | Admitting: *Deleted

## 2016-02-19 NOTE — Telephone Encounter (Signed)
Pt informed of sleep study results. Nothing further needed.

## 2016-03-16 ENCOUNTER — Ambulatory Visit: Payer: 59 | Admitting: Internal Medicine

## 2016-05-21 DIAGNOSIS — L309 Dermatitis, unspecified: Secondary | ICD-10-CM | POA: Diagnosis not present

## 2016-05-21 DIAGNOSIS — D2261 Melanocytic nevi of right upper limb, including shoulder: Secondary | ICD-10-CM | POA: Diagnosis not present

## 2016-05-21 DIAGNOSIS — R21 Rash and other nonspecific skin eruption: Secondary | ICD-10-CM | POA: Diagnosis not present

## 2016-06-21 DIAGNOSIS — R600 Localized edema: Secondary | ICD-10-CM | POA: Diagnosis not present

## 2016-06-21 DIAGNOSIS — I480 Paroxysmal atrial fibrillation: Secondary | ICD-10-CM | POA: Diagnosis not present

## 2016-07-13 ENCOUNTER — Encounter: Payer: Self-pay | Admitting: Family Medicine

## 2016-07-13 ENCOUNTER — Ambulatory Visit (INDEPENDENT_AMBULATORY_CARE_PROVIDER_SITE_OTHER)
Admission: RE | Admit: 2016-07-13 | Discharge: 2016-07-13 | Disposition: A | Discharge status: Home / Self Care | Payer: BLUE CROSS/BLUE SHIELD | Source: Ambulatory Visit | Attending: Family Medicine | Admitting: Family Medicine

## 2016-07-13 ENCOUNTER — Ambulatory Visit (INDEPENDENT_AMBULATORY_CARE_PROVIDER_SITE_OTHER): Payer: BLUE CROSS/BLUE SHIELD | Admitting: Family Medicine

## 2016-07-13 VITALS — BP 165/91 | HR 76 | Temp 97.6°F | Ht 66.0 in | Wt 199.8 lb

## 2016-07-13 DIAGNOSIS — R05 Cough: Secondary | ICD-10-CM | POA: Diagnosis not present

## 2016-07-13 DIAGNOSIS — R042 Hemoptysis: Secondary | ICD-10-CM | POA: Diagnosis not present

## 2016-07-13 DIAGNOSIS — J4530 Mild persistent asthma, uncomplicated: Secondary | ICD-10-CM

## 2016-07-13 DIAGNOSIS — I4891 Unspecified atrial fibrillation: Secondary | ICD-10-CM

## 2016-07-13 DIAGNOSIS — R059 Cough, unspecified: Secondary | ICD-10-CM

## 2016-07-13 NOTE — Progress Notes (Signed)
Pre visit review using our clinic review tool, if applicable. No additional management support is needed unless otherwise documented below in the visit note. 

## 2016-07-13 NOTE — Assessment & Plan Note (Addendum)
No red flags, nonsmoker. No known TB or infectious exposure.  Possibly from chronic cough and now on Eliquis in last 8 months.  Will eval with CXR and possible CT if needed.  Re-eval cbc.

## 2016-07-13 NOTE — Progress Notes (Signed)
   Subjective:    Patient ID: Amanda Estrada, female    DOB: May 18, 1961, 55 y.o.   MRN: [id number]  HPI  55 year old female nonsmoker with history of afib, mild persistant asthma, allergies, chronic cough presents with 5 months of coughing up  bloody sputum.  She has daily cough.. Frequent bright red blood in mucus.  No SOB,  Wheezing once a week and uses albuterol. Helps.  She is on eliquis for afib since 11/2015 She is on losartan sine 11/2015 On Singulair for allergies and asthma. No post nasal drip, minimal allergies.   CXR 11/2015: no acute cardiopulmonary disease.  No family with cough, NO exposure to TB.  Second hand smoke as a child.  Nml ECHO.  Social History /Family History/Past Medical History reviewed and updated if needed. Hx of chronic cough..  ? If from OTC as on past, Was on hyzaar in past.   Review of Systems  Constitutional: Negative for fatigue and fever.  HENT: Negative for ear pain.   Eyes: Negative for pain.  Respiratory: Positive for cough and choking. Negative for chest tightness and shortness of breath.   Cardiovascular: Negative for chest pain, palpitations and leg swelling.  Gastrointestinal: Negative for abdominal pain, blood in stool, constipation, diarrhea and vomiting.  Genitourinary: Negative for dysuria.       Objective:   Physical Exam  Constitutional: Vital signs are normal. She appears well-developed and well-nourished. She is cooperative.  Non-toxic appearance. She does not appear ill. No distress.  HENT:  Head: Normocephalic.  Right Ear: Hearing, tympanic membrane, external ear and ear canal normal. Tympanic membrane is not erythematous, not retracted and not bulging.  Left Ear: Hearing, tympanic membrane, external ear and ear canal normal. Tympanic membrane is not erythematous, not retracted and not bulging.  Nose: No mucosal edema or rhinorrhea. Right sinus exhibits no maxillary sinus tenderness and no frontal sinus tenderness. Left  sinus exhibits no maxillary sinus tenderness and no frontal sinus tenderness.  Mouth/Throat: Uvula is midline, oropharynx is clear and moist and mucous membranes are normal.  Eyes: Conjunctivae, EOM and lids are normal. Pupils are equal, round, and reactive to light. Lids are everted and swept, no foreign bodies found.  Neck: Trachea normal and normal range of motion. Neck supple. Carotid bruit is not present. No thyroid mass and no thyromegaly present.  Cardiovascular: Normal rate, regular rhythm, S1 normal, S2 normal, normal heart sounds, intact distal pulses and normal pulses.  Exam reveals no gallop and no friction rub.   No murmur heard. Pulmonary/Chest: Effort normal and breath sounds normal. No tachypnea. No respiratory distress. She has no decreased breath sounds. She has no wheezes. She has no rhonchi. She has no rales.  Abdominal: Soft. Normal appearance and bowel sounds are normal. There is no tenderness.  Neurological: She is alert.  Skin: Skin is warm, dry and intact. No rash noted.  Psychiatric: Her speech is normal and behavior is normal. Judgment and thought content normal. Her mood appears not anxious. Cognition and memory are normal. She does not exhibit a depressed mood.          Assessment & Plan:

## 2016-07-13 NOTE — Assessment & Plan Note (Signed)
Possibly from poor asthma control cough variant ( although PFTs today show no obstruction) and possibly from ARB. (She has had cough in past, hemoptysis is new since being on blood thinner with afib since 11/2015.) May need to try changing ARB to other med.

## 2016-07-13 NOTE — Assessment & Plan Note (Signed)
Will eval PFTs today to determine if poor asthma control is cause.

## 2016-07-13 NOTE — Patient Instructions (Addendum)
Stop at lab and X-ray on way out.  We will call with results.

## 2016-07-13 NOTE — Assessment & Plan Note (Signed)
On clonidine and ARB.  If work up negative will discuss changing ARB to different med ( given no CHF) to see if chronic cough resolves.

## 2016-07-14 LAB — CBC WITH DIFFERENTIAL/PLATELET
BASOS ABS: 0 10*3/uL (ref 0.0–0.1)
Basophils Relative: 0.4 % (ref 0.0–3.0)
EOS ABS: 0.2 10*3/uL (ref 0.0–0.7)
Eosinophils Relative: 2 % (ref 0.0–5.0)
HEMATOCRIT: 37.6 % (ref 36.0–46.0)
HEMOGLOBIN: 12.6 g/dL (ref 12.0–15.0)
LYMPHS PCT: 20.3 % (ref 12.0–46.0)
Lymphs Abs: 1.8 10*3/uL (ref 0.7–4.0)
MCHC: 33.5 g/dL (ref 30.0–36.0)
MCV: 91.6 fl (ref 78.0–100.0)
MONOS PCT: 5.7 % (ref 3.0–12.0)
Monocytes Absolute: 0.5 10*3/uL (ref 0.1–1.0)
NEUTROS ABS: 6.3 10*3/uL (ref 1.4–7.7)
Neutrophils Relative %: 71.6 % (ref 43.0–77.0)
PLATELETS: 270 10*3/uL (ref 150.0–400.0)
RBC: 4.11 Mil/uL (ref 3.87–5.11)
RDW: 13.5 % (ref 11.5–15.5)
WBC: 8.7 10*3/uL (ref 4.0–10.5)

## 2016-07-15 ENCOUNTER — Encounter: Payer: Self-pay | Admitting: Family Medicine

## 2016-07-20 ENCOUNTER — Encounter: Payer: Self-pay | Admitting: Family Medicine

## 2016-07-20 ENCOUNTER — Ambulatory Visit (INDEPENDENT_AMBULATORY_CARE_PROVIDER_SITE_OTHER): Payer: BLUE CROSS/BLUE SHIELD | Admitting: Family Medicine

## 2016-07-20 VITALS — BP 130/60 | HR 81 | Temp 97.5°F | Ht 66.0 in | Wt 201.8 lb

## 2016-07-20 DIAGNOSIS — R05 Cough: Secondary | ICD-10-CM

## 2016-07-20 DIAGNOSIS — I10 Essential (primary) hypertension: Secondary | ICD-10-CM

## 2016-07-20 DIAGNOSIS — R042 Hemoptysis: Secondary | ICD-10-CM

## 2016-07-20 DIAGNOSIS — R059 Cough, unspecified: Secondary | ICD-10-CM

## 2016-07-20 DIAGNOSIS — Z23 Encounter for immunization: Secondary | ICD-10-CM | POA: Diagnosis not present

## 2016-07-20 DIAGNOSIS — I4891 Unspecified atrial fibrillation: Secondary | ICD-10-CM | POA: Diagnosis not present

## 2016-07-20 DIAGNOSIS — S80811A Abrasion, right lower leg, initial encounter: Secondary | ICD-10-CM

## 2016-07-20 DIAGNOSIS — S80819A Abrasion, unspecified lower leg, initial encounter: Secondary | ICD-10-CM | POA: Insufficient documentation

## 2016-07-20 NOTE — Progress Notes (Signed)
   Subjective:    Patient ID: Amanda Estrada, female    DOB: 11/16/1960, 55 y.o.   MRN: [id number]  HPI    55 year old female with Afib on Xarelto presents with injury to right  Posterior calf.. Scrapped on a screw in a booth.  She is overdue for tetanus. No surrounding redness. Treating peroxide and neosporin.  No fever, no flu like symptoms.  She would like a referral to Dr. Billee Cashing Clean as a new cardiologist as she is having trouble getting her OV covered from [facility].    Review of Systems  Constitutional: Negative for fatigue and fever.  HENT: Negative for ear pain.   Eyes: Negative for pain.  Respiratory: Negative for chest tightness and shortness of breath.   Cardiovascular: Negative for chest pain, palpitations and leg swelling.  Gastrointestinal: Negative for abdominal pain.  Genitourinary: Negative for dysuria.       Objective:   Physical Exam  Constitutional: Vital signs are normal. She appears well-developed and well-nourished. She is cooperative.  Non-toxic appearance. She does not appear ill. No distress.  HENT:  Head: Normocephalic.  Right Ear: Hearing, tympanic membrane, external ear and ear canal normal. Tympanic membrane is not erythematous, not retracted and not bulging.  Left Ear: Hearing, tympanic membrane, external ear and ear canal normal. Tympanic membrane is not erythematous, not retracted and not bulging.  Nose: No mucosal edema or rhinorrhea. Right sinus exhibits no maxillary sinus tenderness and no frontal sinus tenderness. Left sinus exhibits no maxillary sinus tenderness and no frontal sinus tenderness.  Mouth/Throat: Uvula is midline, oropharynx is clear and moist and mucous membranes are normal.  Eyes: Conjunctivae, EOM and lids are normal. Pupils are equal, round, and reactive to light. Lids are everted and swept, no foreign bodies found.  Neck: Trachea normal and normal range of motion. Neck supple. Carotid bruit is not present. No thyroid mass and no  thyromegaly present.  Cardiovascular: Normal rate, S1 normal, S2 normal, normal heart sounds, intact distal pulses and normal pulses.  An irregular rhythm present. Exam reveals no gallop and no friction rub.   No murmur heard. Pulmonary/Chest: Effort normal and breath sounds normal. No tachypnea. No respiratory distress. She has no decreased breath sounds. She has no wheezes. She has no rhonchi. She has no rales.  Abdominal: Soft. Normal appearance and bowel sounds are normal. There is no tenderness.  Neurological: She is alert.  Skin: Skin is warm, dry and intact. No rash noted.  wel healing scratch on right posterior calf.  Psychiatric: Her speech is normal and behavior is normal. Judgment and thought content normal. Her mood appears not anxious. Cognition and memory are normal. She does not exhibit a depressed mood.          Assessment & Plan:

## 2016-07-20 NOTE — Assessment & Plan Note (Signed)
Likely secondary to blood thinner and chronic cough. No red flags or risk factors.  If not improving if able to stop ARB.. Will eval with imaging.

## 2016-07-20 NOTE — Assessment & Plan Note (Signed)
Well healing. Tdap given today.

## 2016-07-20 NOTE — Assessment & Plan Note (Signed)
May be due to ARB given pt's asthma.  I have sent a letter to her [facility] MD requesting consideration of switching ARB to a different BP med given no CHF.  No response.  Will ask tohe same request from new cardiology.  Pt does to wish me to change the med without reviewing with her new cardiologist yet.

## 2016-07-20 NOTE — Progress Notes (Signed)
Pre visit review using our clinic review tool, if applicable. No additional management support is needed unless otherwise documented below in the visit note. 

## 2016-07-20 NOTE — Patient Instructions (Addendum)
Stop at front desk on way out for cardiology referral. Make sure to discus changing losartan to different med if possible to see if cough improved.  If no improvement on different med.. Call for [place] to eval further with chest CT.

## 2016-07-20 NOTE — Assessment & Plan Note (Signed)
Refer to different cardiologist per top request. [facility] cards not covered by her insurance any longer.

## 2016-07-21 ENCOUNTER — Ambulatory Visit (INDEPENDENT_AMBULATORY_CARE_PROVIDER_SITE_OTHER): Payer: BLUE CROSS/BLUE SHIELD | Admitting: Podiatry

## 2016-07-21 ENCOUNTER — Ambulatory Visit (INDEPENDENT_AMBULATORY_CARE_PROVIDER_SITE_OTHER): Payer: BLUE CROSS/BLUE SHIELD

## 2016-07-21 ENCOUNTER — Encounter: Payer: Self-pay | Admitting: Podiatry

## 2016-07-21 DIAGNOSIS — M25571 Pain in right ankle and joints of right foot: Secondary | ICD-10-CM

## 2016-07-21 DIAGNOSIS — S93401A Sprain of unspecified ligament of right ankle, initial encounter: Secondary | ICD-10-CM | POA: Diagnosis not present

## 2016-07-22 NOTE — Progress Notes (Signed)
Subjective:     Patient ID: Amanda Estrada, female   DOB: 07/14/1961, 55 y.o.   MRN: [id number]  HPI patient presents stating that the right ankle has been traumatized secondary to dropping a device against it and it's been swollen for 10 days and she is concerned about fracture   Review of Systems     Objective:   Physical Exam Neurovascular status intact muscle strength adequate range of motion within normal limits with patient noted to have edema and pain in the lateral side of the right ankle around the anterior talofibular posterior talofibular ligament with no excessive laxity noted at the current time    Assessment:     Sprained ankle right with edema and pain    Plan:     H&P x-ray reviewed and discussed ice therapy anti-inflammatories and supportive year. Reappoint for is to recheck again  X-ray report indicates no indications of diastases injury or fracture

## 2016-07-23 ENCOUNTER — Telehealth: Payer: Self-pay

## 2016-07-23 DIAGNOSIS — I10 Essential (primary) hypertension: Secondary | ICD-10-CM | POA: Diagnosis not present

## 2016-07-23 DIAGNOSIS — J209 Acute bronchitis, unspecified: Secondary | ICD-10-CM | POA: Diagnosis not present

## 2016-07-23 NOTE — Telephone Encounter (Signed)
PLEASE NOTE: All timestamps contained within this report are represented as [place] Standard Time. CONFIDENTIALTY NOTICE: This fax transmission is intended only for the addressee. It contains information that is legally privileged, confidential or otherwise protected from use or disclosure. If you are not the intended recipient, you are strictly prohibited from reviewing, disclosing, copying using or disseminating any of this information or taking any action in reliance on or regarding this information. If you have received this fax in error, please notify us immediately by telephone so that we can arrange for its return to [place]. Phone: [phone], Toll-Free: [phone], Fax: [phone] Page: 1 of 2 Call Id: [id number] Mila Doce Patient Name: Amanda Estrada Gender: Female DOB: 1961/01/30 Age: 29 Y 10 M 5 D Return Phone Number: [phone] (Primary), [phone] (Secondary) [place] [address] Client [facility] Night - Client Client Site [place] Physician Eliezer Lofts - MD Contact Type Call Who Is Calling Patient / Member / Family / Caregiver Call Type Triage / Clinical Relationship To Patient Self Return Phone Number [phone] (Primary) Chief Complaint Cough Reason for Call Symptomatic / Request for [place] states that she has cold symptoms with a cough PreDisposition Call Doctor Translation No Nurse Assessment Nurse: Lavera Guise, RN, Vaughan Basta Date/Time (Eastern Time): 07/23/2016 2:57:27 PM Confirm and document reason for call. If symptomatic, describe symptoms. You must click the next button to save text entered. ---Caller states that she has cold symptoms with a cough. No fever. No sore throat. Runny nose and coughing, whenever she talks and when she lays down. Nose is  better today. Post nasal drip yesterday. Uses an inhaler and nose spray. Discharge coughed out is clear. Started yesterday. Doesn't feel real bad. No ear pain, wheezing or vomiting. Has the patient traveled out of the country within the last 30 days? ---No Does the patient have any new or worsening symptoms? ---Yes Will a triage be completed? ---Yes Related visit to physician within the last 2 weeks? ---Yes Does the PT have any chronic conditions? (i.e. diabetes, asthma, etc.) ---Yes List chronic conditions. ---small case of asthma Is this a behavioral health or substance abuse call? ---No Guidelines Guideline Title Affirmed Question Affirmed Notes Nurse Date/Time (Eastern Time) Common Cold Cold with no complications (all triage questions negative) Kluth, RN, Vaughan Basta 07/23/2016 3:02:09 PM Disp. Time Eilene Ghazi Time) Disposition Final User 07/23/2016 3:05:49 PM Lydia, RN, Vaughan Basta PLEASE NOTE: All timestamps contained within this report are represented as [place] Standard Time. CONFIDENTIALTY NOTICE: This fax transmission is intended only for the addressee. It contains information that is legally privileged, confidential or otherwise protected from use or disclosure. If you are not the intended recipient, you are strictly prohibited from reviewing, disclosing, copying using or disseminating any of this information or taking any action in reliance on or regarding this information. If you have received this fax in error, please notify us immediately by telephone so that we can arrange for its return to [place]. Phone: [phone], Toll-Free: [phone], Fax: [phone] Page: 2 of 2 Call Id: [id number] Caller Understands: Yes Disagree/Comply: Comply Care Advice Given Per Guideline HOME CARE: You should be able to treat this at home. REASSURANCE: * It sounds like an uncomplicated cold that we can treat at home. * Colds are very common and may make you feel uncomfortable. * Colds are  caused by viruses, and no  medicine or 'shot' will cure an uncomplicated cold. * Colds are usually not serious. FOR A STUFFY NOSE - USE NASAL WASHES: * STEP 1: Lean over a sink. * STEP 2: Gently squirt or spray warm salt water into one of your nostrils. * STEP 3: Some of the water may run into the back of your throat. Spit this out. If you swallow the salt water it will not hurt you. * STEP 4: Blow your nose to clean out the water and mucus. * STEP 5: Repeat steps 1-4 for the other nostril. You can do this a couple times a day if it seems to help you. * Blowing your nose helps clean out your nose. Use a handkerchief or a paper tissue. FOR A RUNNY NOSE - BLOW YOUR NOSE: NASAL DECONGESTANTS FOR A VERY STUFFY NOSE: * PSEUDOEPHEDRINE (Sudafed) is available OTC in pill form. Typical adult dosage is two 30 mg tablets every 6 hours. * OXYMETAZOLINE NASAL DROPS (Afrin) are available OTC. Clean out the nose before using. Spray each nostril once, wait one minute for absorption, and then spray a second time. * PHENYLEPHRINE NASAL DROPS (Neo-Synephrine) are available OTC. Clean out the nose before using. Spray each nostril once, wait one minute for absorption, and then spray a second time. * Cough: use cough drops. TREATMENT FOR ASSOCIATED SYMPTOMS OF COLDS: * Sore throat: throat lozenges, hard candy or warm chicken broth. * Hydrate: drink extra liquids. HUMIDIFIER: If the air in your home is dry, use a humidifier. CONTAGIOUSNESS: * Wash your hands frequently with soap and water. * The cold virus is present in your nasal secretions. * Nasal discharge 7-14 days * Cough 2-3 weeks. EXPECTED COURSE: * Fever 2-3 days CALL BACK IF: * You become short of breath * You become worse. CARE ADVICE given per Colds (Adult) guideline.

## 2016-08-10 ENCOUNTER — Telehealth: Payer: Self-pay | Admitting: Family Medicine

## 2016-08-10 NOTE — Telephone Encounter (Signed)
Notify pt that I got a letter back from [place] , Dr. Flonnie Overman.  Dr Flonnie Overman says if we want to hold the losartan for 1-2 weeks, we can, to see if the cough resolves. She made no specific recommendations on how to control BP in the interim.  If patient would like to proceed.. I would have her hold the losartan and call with her BPs in the next 2-3 days for likely medication adjustment.  Otherwise we can await her appt with her new cardiologist... especially if no further bloody sputum.

## 2016-08-10 NOTE — Telephone Encounter (Signed)
Left message for Ms. Mcduffey to return my call.

## 2016-08-11 NOTE — Telephone Encounter (Addendum)
Amanda Estrada notified as instructed by telephone.  She does not want to stop her blood pressure medicine.  She wants to wait until she sees her new cardiologist.  She states she stopped her blood thinner for two weeks She states she still coughs up blood every now but it has improved.  She is going to restart her blood thinner.

## 2016-08-12 ENCOUNTER — Ambulatory Visit: Payer: BLUE CROSS/BLUE SHIELD | Admitting: Podiatry

## 2016-08-12 NOTE — Telephone Encounter (Signed)
Noted  

## 2016-09-03 ENCOUNTER — Other Ambulatory Visit: Payer: Self-pay | Admitting: Cardiovascular Disease

## 2016-10-04 ENCOUNTER — Other Ambulatory Visit: Payer: Self-pay | Admitting: Family Medicine

## 2016-10-15 ENCOUNTER — Ambulatory Visit (INDEPENDENT_AMBULATORY_CARE_PROVIDER_SITE_OTHER): Payer: BLUE CROSS/BLUE SHIELD | Admitting: Cardiovascular Disease

## 2016-10-15 ENCOUNTER — Encounter: Payer: Self-pay | Admitting: Cardiovascular Disease

## 2016-10-15 ENCOUNTER — Institutional Professional Consult (permissible substitution): Payer: BLUE CROSS/BLUE SHIELD | Admitting: Cardiology

## 2016-10-15 VITALS — BP 140/62 | HR 74 | Ht 66.0 in | Wt 203.0 lb

## 2016-10-15 DIAGNOSIS — E7800 Pure hypercholesterolemia, unspecified: Secondary | ICD-10-CM | POA: Diagnosis not present

## 2016-10-15 DIAGNOSIS — G4733 Obstructive sleep apnea (adult) (pediatric): Secondary | ICD-10-CM

## 2016-10-15 DIAGNOSIS — R05 Cough: Secondary | ICD-10-CM

## 2016-10-15 DIAGNOSIS — Z7189 Other specified counseling: Secondary | ICD-10-CM

## 2016-10-15 DIAGNOSIS — J4530 Mild persistent asthma, uncomplicated: Secondary | ICD-10-CM

## 2016-10-15 DIAGNOSIS — I480 Paroxysmal atrial fibrillation: Secondary | ICD-10-CM | POA: Diagnosis not present

## 2016-10-15 DIAGNOSIS — R059 Cough, unspecified: Secondary | ICD-10-CM

## 2016-10-15 DIAGNOSIS — I10 Essential (primary) hypertension: Secondary | ICD-10-CM

## 2016-10-15 DIAGNOSIS — K219 Gastro-esophageal reflux disease without esophagitis: Secondary | ICD-10-CM

## 2016-10-15 MED ORDER — APIXABAN 5 MG PO TABS: 5.0000 mg | ORAL_TABLET | Freq: Two times a day (BID) | ORAL | 4 refills | Status: DC

## 2016-10-15 MED ORDER — METOPROLOL SUCCINATE ER 25 MG PO TB24: 25.0000 mg | ORAL_TABLET | Freq: Every day | ORAL | 3 refills | Status: DC

## 2016-10-15 NOTE — Progress Notes (Signed)
Cardiology Office Note  Date:  10/15/2016   ID:  Amanda Estrada, DOB [birth year], MRN [id number]  PCP:  Eliezer Lofts, MD   Chief Complaint  Patient presents with  . other    Follow up from HTN and A-Fib. Meds reviewed by the pt. verbally. Pt. c/o coughing up blood for 6-8 months and feels due to the Losartan.     HPI:   Amanda Estrada is a very pleasant 55 year old woman with history of hypertension and asthma, paroxysmal atrial fibrillation. She presents for routine followup of her blood pressure and PAF No smoking history  Review of prior records indicates hospitalization January 2017 for paroxysmal atrial fibrillation Started on anticoagulation  On today's visit she reports she has stopped her anticoagulation for the past month She was having coughing, blood tinged sputum Off eliquis one month Denies any upper respiratory infection, wonders if one of the other medications could contribute to her cough She has been having increasing frequency of tachycardia episodes, 2 episodes this week, resolved after she took metoprolol  She reports blood pressure has been relatively stable  EKG today shows normal sinus rhythm with rate 72 bpm, no significant ST or T-wave changes   Other past medical history Reports her father had bypass surgery in his 2s  mother alive and well in her 89s   PMH:   has a past medical history of Allergy; Anxiety; Asthma; History of echocardiogram; Hypertension; Obesity; PAF (paroxysmal atrial fibrillation) ([place]); and Sinusitis (2011).  PSH:    Past Surgical History:  Procedure Laterality Date  . BUNIONECTOMY  11/2007   Right foot, 2 hammer-toes and tendon replacement (Dr. Paulla Dolly)  . CERVICAL DISCECTOMY  2001   C5/6  . CESAREAN SECTION  1985   Due to incr BP  . ENDOMETRIAL BIOPSY  03/27/2008   B9 endometrium w/breakdown changes (Dr, Kinscius)  . NSVD  1989   VBAC  . Renal artery [place]  02/2007   [place] arteries but no stenosis  . US TRANSVAGINAL  PELVIC MODIFIED  04/09/2008   Normal    Current Outpatient Prescriptions  Medication Sig Dispense Refill  . albuterol (PROVENTIL HFA;VENTOLIN HFA) 108 (90 Base) MCG/ACT inhaler Inhale 2 puffs into the lungs every 6 (six) hours as needed for wheezing or shortness of breath.    . cetirizine (ZYRTEC) 10 MG tablet Take 10 mg by mouth daily.    . cloNIDine (CATAPRES) 0.2 MG tablet TAKE 1 TABLET (0.2 MG TOTAL) BY MOUTH AT BEDTIME.  3  . diltiazem (CARDIZEM CD) 240 MG 24 hr capsule TAKE 1 CAPSULE (240 MG TOTAL) BY MOUTH ONCE DAILY.  11  . fluticasone (FLONASE) 50 MCG/ACT nasal spray USE 1 SPRAY IN THE NOSE DAILY AS DIRECTED 16 g 5  . losartan (COZAAR) 100 MG tablet Take 1 tablet (100 mg total) by mouth daily. 90 tablet 3  . metoprolol succinate (TOPROL-XL) 25 MG 24 hr tablet Take 1 tablet (25 mg total) by mouth daily. 90 tablet 3  . montelukast (SINGULAIR) 10 MG tablet TAKE 1 TABLET (10 MG TOTAL) BY MOUTH AT BEDTIME. 90 tablet 3  . omeprazole (PRILOSEC) 40 MG capsule Take 40 mg by mouth daily.    . rosuvastatin (CRESTOR) 20 MG tablet Take 1 tablet (20 mg total) by mouth daily. 90 tablet 3  . spironolactone (ALDACTONE) 25 MG tablet TAKE 1 TABLET BY MOUTH DAILY  11  . apixaban (ELIQUIS) 5 MG TABS tablet Take 1 tablet (5 mg total) by mouth 2 (two) times daily.  180 tablet 4   No current facility-administered medications for this visit.      Allergies:   Patient has no known allergies.   Social History:  The patient  reports that she has never smoked. She has never used smokeless tobacco. She reports that she drinks about 8.4 oz of alcohol per week . She reports that she does not use drugs.   Family History:   family history includes Cancer in her father and mother; Colon cancer (age of onset: 63) in her mother; Colon cancer (age of onset: 69) in her father; Colon polyps in her brother, sister, sister, and sister; Heart disease in her father; Hypertension in her brother, father, and sister; Pneumonia  in her father.    Review of Systems: Review of Systems  Constitutional: Negative.   Respiratory: Negative.   Cardiovascular: Positive for palpitations.       Tachycardia  Gastrointestinal: Negative.   Musculoskeletal: Negative.   Neurological: Negative.   Psychiatric/Behavioral: Negative.   All other systems reviewed and are negative.    PHYSICAL EXAM: VS:  BP 140/62 (BP Location: Left Arm, Patient Position: Sitting, Cuff Size: Normal)   Pulse 74   Ht '5\' 6"'$  (1.676 m)   Wt 203 lb (92.1 kg)   BMI 32.77 kg/m  , BMI Body mass index is 32.77 kg/m. GEN: Well nourished, well developed, in no acute distress  HEENT: normal  Neck: no JVD, carotid bruits, or masses Cardiac: RRR; no murmurs, rubs, or gallops,no edema  Respiratory:  clear to auscultation bilaterally, normal work of breathing GI: soft, nontender, nondistended, + BS MS: no deformity or atrophy  Skin: warm and dry, no rash Neuro:  Strength and sensation are intact Psych: euthymic mood, full affect    Recent Labs: 11/24/2015: ALT 23; TSH 1.653 12/02/2015: BUN 12; Creatinine, Ser 0.67; Magnesium 2.1; Potassium 4.8; Sodium 134 07/13/2016: Hemoglobin 12.6; Platelets 270.0    Lipid Panel Lab Results  Component Value Date   CHOL 179 11/05/2015   HDL 67.60 11/05/2015   LDLCALC 82 11/05/2015   TRIG 150.0 (H) 11/05/2015      Wt Readings from Last 3 Encounters:  10/15/16 203 lb (92.1 kg)  07/20/16 201 lb 12 oz (91.5 kg)  07/13/16 199 lb 12 oz (90.6 kg)       ASSESSMENT AND PLAN:  Paroxysmal atrial fibrillation (HCC) - Plan: EKG 12-Lead Long discussion concerning the need for anticoagulation. Discussed risk of stroke. Recommended she restart eliquis 5 mg twice a day Samples, prescription provided  Also recommended she start metoprolol succinate 25 mg daily as she is having frequent episodes of atrial fibrillation   Encounter for anticoagulation discussion and counseling As above, she is willing to restart  anticoagulation   Essential hypertension Recommended she start metoprolol daily as above  If blood pressure runs low, would decrease losartan down to one half pill daily, 50 mg   Pure hypercholesterolemia Recommended she stay on her Crestor   Mild persistent asthma without complication  COUGH, CHRONIC Etiology of her cough is unclear, possibly from asthma, even GERD  Recommended she take PPI medication in the evening as well as morning  Less likely losartan but she could cut this in half when she is on metoprolol   Obstructive sleep apnea  Gastroesophageal reflux disease without esophagitis Currently on PPI   Total encounter time more than 25 minutes  Greater than 50% was spent in counseling and coordination of care with the patient   Disposition:   F/U  6 months   Orders Placed This Encounter  Procedures  . EKG 12-Lead     Signed, Esmond Plants, M.D., Ph.D. 10/15/2016  [place], [place]

## 2016-10-15 NOTE — Patient Instructions (Addendum)
Medication Instructions:   Please go back on eliquis twice a day Stop aspirin  Consider taking metoprolol daily Cut the losartan in 1/2 daily  Reflux pill twice a day  Labwork:  No new labs needed  Testing/Procedures:  No further testing at this time   I recommend watching educational videos on topics of interest to you at:       [email or web address].com  Enter code: HEARTCARE    Follow-Up: It was a pleasure seeing you in the office today. Please call us if you have new issues that need to be addressed before your next appt.  [phone]  Your physician wants you to follow-up in: 6 months.  You will receive a reminder letter in the mail two months in advance. If you don't receive a letter, please call our office to schedule the follow-up appointment.  If you need a refill on your cardiac medications before your next appointment, please call your pharmacy.

## 2016-10-28 ENCOUNTER — Other Ambulatory Visit: Payer: Self-pay | Admitting: Family Medicine

## 2016-11-11 ENCOUNTER — Other Ambulatory Visit: Payer: Self-pay | Admitting: Family Medicine

## 2016-11-24 ENCOUNTER — Other Ambulatory Visit: Payer: Self-pay | Admitting: Cardiovascular Disease

## 2016-11-29 DIAGNOSIS — J111 Influenza due to unidentified influenza virus with other respiratory manifestations: Secondary | ICD-10-CM | POA: Diagnosis not present

## 2016-11-29 DIAGNOSIS — I10 Essential (primary) hypertension: Secondary | ICD-10-CM | POA: Diagnosis not present

## 2016-12-08 DIAGNOSIS — E785 Hyperlipidemia, unspecified: Secondary | ICD-10-CM | POA: Diagnosis not present

## 2016-12-08 DIAGNOSIS — I480 Paroxysmal atrial fibrillation: Secondary | ICD-10-CM | POA: Diagnosis not present

## 2016-12-08 DIAGNOSIS — I10 Essential (primary) hypertension: Secondary | ICD-10-CM | POA: Diagnosis not present

## 2016-12-29 DIAGNOSIS — M25561 Pain in right knee: Secondary | ICD-10-CM | POA: Diagnosis not present

## 2016-12-29 DIAGNOSIS — M2391 Unspecified internal derangement of right knee: Secondary | ICD-10-CM | POA: Diagnosis not present

## 2017-02-27 ENCOUNTER — Encounter: Payer: Self-pay | Admitting: Family Medicine

## 2017-03-04 ENCOUNTER — Encounter: Payer: Self-pay | Admitting: Family Medicine

## 2017-03-04 ENCOUNTER — Ambulatory Visit (INDEPENDENT_AMBULATORY_CARE_PROVIDER_SITE_OTHER): Payer: BLUE CROSS/BLUE SHIELD | Admitting: Family Medicine

## 2017-03-04 DIAGNOSIS — F418 Other specified anxiety disorders: Secondary | ICD-10-CM

## 2017-03-04 DIAGNOSIS — I4891 Unspecified atrial fibrillation: Secondary | ICD-10-CM | POA: Diagnosis not present

## 2017-03-04 MED ORDER — ALPRAZOLAM 0.5 MG PO TABS: ORAL_TABLET | ORAL | 1 refills | Status: DC

## 2017-03-04 NOTE — Assessment & Plan Note (Addendum)
Decreased episodes of afib per pt. Using metoprolol prn.   encouraged pt to keep up with fluids, sleep and food on plane flight.

## 2017-03-04 NOTE — Progress Notes (Signed)
Pre visit review using our clinic review tool, if applicable. No additional management support is needed unless otherwise documented below in the visit note. 

## 2017-03-04 NOTE — Patient Instructions (Signed)
Use alprazolam on flight as needed.  Healthy habits during plane flight including avoiding dehydration.

## 2017-03-04 NOTE — Assessment & Plan Note (Signed)
Associated with palne flight as well as restless on plane floight. Will give benzodiazapine low dose to use on plane flight as needed.

## 2017-03-04 NOTE — Progress Notes (Signed)
   Subjective:    Patient ID: Amanda Estrada, female    DOB: 1961/05/18, 56 y.o.   MRN: [id number]  HPI   56 year old female with paroxysmal atrial fibrillation presents for discussion of anxiety. She feels that when she is anxious.. It triggers palpitations and  puts her into afib temporarily.  She would like to treat anxiety with a prn med.  GAD7: 2 today.  PHQ9: 1  No day to day anxiety. She does have anxiety and restlessness with plane flights that she has to do with work. She is traveling to [place] this year in May.     Reviewed her last OV with Dr. Rockey Situ. Started on metoprolol 25 mg daily for increased frequency of episodes of atrial fibrillation.  She has noted a decrease now  In last month of afib spells.  She has only used metoprolol 2 times in last month.   Review of Systems  Constitutional: Negative for fatigue and fever.  HENT: Negative for ear pain.   Eyes: Negative for pain.  Respiratory: Negative for chest tightness and shortness of breath.   Cardiovascular: Negative for chest pain, palpitations and leg swelling.  Gastrointestinal: Negative for abdominal pain.  Genitourinary: Negative for dysuria.       Objective:   Physical Exam  Constitutional: Vital signs are normal. She appears well-developed and well-nourished. She is cooperative.  Non-toxic appearance. She does not appear ill. No distress.  HENT:  Head: Normocephalic.  Right Ear: Hearing, tympanic membrane, external ear and ear canal normal. Tympanic membrane is not erythematous, not retracted and not bulging.  Left Ear: Hearing, tympanic membrane, external ear and ear canal normal. Tympanic membrane is not erythematous, not retracted and not bulging.  Nose: No mucosal edema or rhinorrhea. Right sinus exhibits no maxillary sinus tenderness and no frontal sinus tenderness. Left sinus exhibits no maxillary sinus tenderness and no frontal sinus tenderness.  Mouth/Throat: Uvula is midline, oropharynx is  clear and moist and mucous membranes are normal.  Eyes: Conjunctivae, EOM and lids are normal. Pupils are equal, round, and reactive to light. Lids are everted and swept, no foreign bodies found.  Neck: Trachea normal and normal range of motion. Neck supple. Carotid bruit is not present. No thyroid mass and no thyromegaly present.  Cardiovascular: Normal rate, regular rhythm, S1 normal, S2 normal, normal heart sounds, intact distal pulses and normal pulses.  Exam reveals no gallop and no friction rub.   No murmur heard. Pulmonary/Chest: Effort normal and breath sounds normal. No tachypnea. No respiratory distress. She has no decreased breath sounds. She has no wheezes. She has no rhonchi. She has no rales.  Abdominal: Soft. Normal appearance and bowel sounds are normal. There is no tenderness.  Neurological: She is alert.  Skin: Skin is warm, dry and intact. No rash noted.  Psychiatric: Her speech is normal and behavior is normal. Judgment and thought content normal. Her mood appears not anxious. Cognition and memory are normal. She does not exhibit a depressed mood.          Assessment & Plan:

## 2017-05-30 ENCOUNTER — Other Ambulatory Visit: Payer: Self-pay | Admitting: Family Medicine

## 2017-06-30 DIAGNOSIS — Z85820 Personal history of malignant melanoma of skin: Secondary | ICD-10-CM | POA: Diagnosis not present

## 2017-06-30 DIAGNOSIS — Z85828 Personal history of other malignant neoplasm of skin: Secondary | ICD-10-CM | POA: Diagnosis not present

## 2017-06-30 DIAGNOSIS — L239 Allergic contact dermatitis, unspecified cause: Secondary | ICD-10-CM | POA: Diagnosis not present

## 2017-06-30 DIAGNOSIS — L309 Dermatitis, unspecified: Secondary | ICD-10-CM | POA: Diagnosis not present

## 2017-07-04 DIAGNOSIS — L239 Allergic contact dermatitis, unspecified cause: Secondary | ICD-10-CM | POA: Diagnosis not present

## 2017-07-06 DIAGNOSIS — L259 Unspecified contact dermatitis, unspecified cause: Secondary | ICD-10-CM | POA: Diagnosis not present

## 2017-07-06 DIAGNOSIS — R21 Rash and other nonspecific skin eruption: Secondary | ICD-10-CM | POA: Diagnosis not present

## 2017-07-12 DIAGNOSIS — L239 Allergic contact dermatitis, unspecified cause: Secondary | ICD-10-CM | POA: Diagnosis not present

## 2017-07-22 DIAGNOSIS — J019 Acute sinusitis, unspecified: Secondary | ICD-10-CM | POA: Diagnosis not present

## 2017-08-02 ENCOUNTER — Ambulatory Visit: Payer: BLUE CROSS/BLUE SHIELD | Admitting: Family Medicine

## 2017-08-02 ENCOUNTER — Encounter: Payer: Self-pay | Admitting: Family Medicine

## 2017-08-02 ENCOUNTER — Telehealth: Payer: Self-pay

## 2017-08-02 ENCOUNTER — Ambulatory Visit
Admission: RE | Admit: 2017-08-02 | Discharge: 2017-08-02 | Disposition: A | Discharge status: Home / Self Care | Payer: BLUE CROSS/BLUE SHIELD | Source: Ambulatory Visit | Attending: Family Medicine | Admitting: Family Medicine

## 2017-08-02 ENCOUNTER — Ambulatory Visit (INDEPENDENT_AMBULATORY_CARE_PROVIDER_SITE_OTHER): Payer: BLUE CROSS/BLUE SHIELD | Admitting: Family Medicine

## 2017-08-02 ENCOUNTER — Telehealth: Payer: Self-pay | Admitting: Family Medicine

## 2017-08-02 VITALS — BP 140/68 | HR 81 | Temp 98.3°F | Ht 66.0 in | Wt 190.5 lb

## 2017-08-02 DIAGNOSIS — J4541 Moderate persistent asthma with (acute) exacerbation: Secondary | ICD-10-CM | POA: Diagnosis not present

## 2017-08-02 DIAGNOSIS — R05 Cough: Secondary | ICD-10-CM | POA: Insufficient documentation

## 2017-08-02 DIAGNOSIS — R059 Cough, unspecified: Secondary | ICD-10-CM

## 2017-08-02 DIAGNOSIS — R042 Hemoptysis: Secondary | ICD-10-CM | POA: Diagnosis not present

## 2017-08-02 DIAGNOSIS — R918 Other nonspecific abnormal finding of lung field: Secondary | ICD-10-CM | POA: Diagnosis not present

## 2017-08-02 DIAGNOSIS — I2510 Atherosclerotic heart disease of native coronary artery without angina pectoris: Secondary | ICD-10-CM | POA: Diagnosis not present

## 2017-08-02 DIAGNOSIS — R9389 Abnormal findings on diagnostic imaging of other specified body structures: Secondary | ICD-10-CM

## 2017-08-02 LAB — BASIC METABOLIC PANEL
BUN: 8 mg/dL (ref 6–23)
CALCIUM: 9.7 mg/dL (ref 8.4–10.5)
CO2: 27 meq/L (ref 19–32)
Chloride: 98 mEq/L (ref 96–112)
Creatinine, Ser: 0.66 mg/dL (ref 0.40–1.20)
GFR: 98.16 mL/min (ref >60.00)
GLUCOSE: 114 mg/dL — AB (ref 70–99)
Potassium: 4.2 mEq/L (ref 3.5–5.1)
Sodium: 133 mEq/L — ABNORMAL LOW (ref 135–145)

## 2017-08-02 LAB — CBC WITH DIFFERENTIAL/PLATELET
BASOS PCT: 0.5 % (ref 0.0–3.0)
Basophils Absolute: 0 10*3/uL (ref 0.0–0.1)
EOS PCT: 1.7 % (ref 0.0–5.0)
Eosinophils Absolute: 0.1 10*3/uL (ref 0.0–0.7)
HEMATOCRIT: 36.5 % (ref 36.0–46.0)
HEMOGLOBIN: 12.3 g/dL (ref 12.0–15.0)
LYMPHS PCT: 19 % (ref 12.0–46.0)
Lymphs Abs: 1.3 10*3/uL (ref 0.7–4.0)
MCHC: 33.6 g/dL (ref 30.0–36.0)
MCV: 98.4 fl (ref 78.0–100.0)
MONOS PCT: 6.5 % (ref 3.0–12.0)
Monocytes Absolute: 0.5 10*3/uL (ref 0.1–1.0)
Neutro Abs: 5 10*3/uL (ref 1.4–7.7)
Neutrophils Relative %: 72.3 % (ref 43.0–77.0)
Platelets: 343 10*3/uL (ref 150.0–400.0)
RBC: 3.71 Mil/uL — AB (ref 3.87–5.11)
RDW: 13.7 % (ref 11.5–15.5)
WBC: 6.9 10*3/uL (ref 4.0–10.5)

## 2017-08-02 MED ORDER — PREDNISONE 20 MG PO TABS: ORAL_TABLET | ORAL | 0 refills | Status: DC

## 2017-08-02 MED ORDER — LEVOFLOXACIN 500 MG PO TABS: 500.0000 mg | ORAL_TABLET | Freq: Every day | ORAL | 0 refills | Status: DC

## 2017-08-02 NOTE — Telephone Encounter (Signed)
Amanda Estrada with CT called report for CT chest and report is now in epic. Copy taken to Dr Rometta Emery area. Pt went back to work.

## 2017-08-02 NOTE — Telephone Encounter (Signed)
Discussed labs and CT results with pt in detail. Pt nontoxic and no anemia.. Small amount of blood most likely from upper airway irritaion. Will start on antibiotics and prednisone, albuterol prn.  Refer to pulm for further recs for hemoptysis and repeat CT for further eval after antibiotics as well as recommendation on asthma treatment.

## 2017-08-02 NOTE — Assessment & Plan Note (Signed)
Recurrent.. eval with chest CT. Nonsmoker. CXR in past negative.  Eval with CBC as well. No risk factors for TB exposure etc.

## 2017-08-02 NOTE — Progress Notes (Signed)
Subjective:    Patient ID: Amanda Estrada, female    DOB: [birth year], 56 y.o.   MRN: [id number]  HPI  56 year old female pt with history of afib on  eliqus anticoagulation with  Asthma and chronic cough  presents with continue cough as well as hempoptysis   Has had chronic cough since 2013.. Neg CXR. Improved initially with allergy meds singulair, zyrtec and flonase.  New small amount of hemoptysis in 2017 since starting eliqus. She stopped eliquis x 2 months earlier in year.. But still was coughing up blood off eliq  In last 2 weeks. More chest tightness and wheeze, increase cough. INitial fever 101F 2 weeks ago, resolved now. Increase DOE.  Has continued to notice  Blood off and on in mucus in last year.. Now in last few days.. bright red blood seen on pillow.   Coughing through the night.Benedict Needy her up but able to go back to sleep.  No SOB, but mild with exertion.  2 weeks ago: She recently went to amoxicillin  X 3 days.. Stopped because of diarrhea.  She has been using albuterol prn..  She is on ARB   She has noted in last week.. Solid food cathching in chest and causing pain.  Blood pressure 140/68, pulse 81, temperature 98.3 F (36.8 C), temperature source Oral, height 5\' 6"  (1.676 m), weight 190 lb 8 oz (86.4 kg), SpO2 97 %.  Review of Systems  Constitutional: Negative for fatigue and fever.  HENT: Negative for ear pain and nosebleeds.   Eyes: Negative for pain.  Respiratory: Positive for cough, chest tightness, shortness of breath and wheezing.   Cardiovascular: Negative for chest pain, palpitations and leg swelling.  Gastrointestinal: Negative for abdominal pain.  Genitourinary: Negative for dysuria.       Objective:   Physical Exam  Constitutional: Vital signs are normal. She appears well-developed and well-nourished. She is cooperative.  Non-toxic appearance. She does not appear ill. No distress.  HENT:  Head: Normocephalic.  Right Ear: Hearing, tympanic  membrane, external ear and ear canal normal. Tympanic membrane is not erythematous, not retracted and not bulging.  Left Ear: Hearing, tympanic membrane, external ear and ear canal normal. Tympanic membrane is not erythematous, not retracted and not bulging.  Nose: No mucosal edema or rhinorrhea. Right sinus exhibits no maxillary sinus tenderness and no frontal sinus tenderness. Left sinus exhibits no maxillary sinus tenderness and no frontal sinus tenderness.  Mouth/Throat: Uvula is midline, oropharynx is clear and moist and mucous membranes are normal.  Eyes: Pupils are equal, round, and reactive to light. Conjunctivae, EOM and lids are normal. Lids are everted and swept, no foreign bodies found.  Neck: Trachea normal and normal range of motion. Neck supple. Carotid bruit is not present. No thyroid mass and no thyromegaly present.  Cardiovascular: Normal rate, regular rhythm, S1 normal, S2 normal, normal heart sounds, intact distal pulses and normal pulses.  Exam reveals no gallop and no friction rub.   No murmur heard. Pulmonary/Chest: Effort normal. No tachypnea. No respiratory distress. She has no decreased breath sounds. She has wheezes. She has rhonchi. She has no rales.  Diffuse wheeze and rhonchi, pt appears nontoxic   Abdominal: Soft. Normal appearance and bowel sounds are normal. There is no tenderness.  Neurological: She is alert.  Skin: Skin is warm, dry and intact. No rash noted.  Psychiatric: Her speech is normal and behavior is normal. Judgment and thought content normal. Her mood appears not anxious. Cognition  and memory are normal. She does not exhibit a depressed mood.          Assessment & Plan:

## 2017-08-02 NOTE — Patient Instructions (Addendum)
Please stop at the front desk to set up referral and for labs.  Start prednisone taper in morning today.  Hold eliquis today.  We will call with CT results to recommend antibiotics.  Use albuterol as needed for  wheeze.  Go to ER if large amount of blood coughed up.

## 2017-08-02 NOTE — Assessment & Plan Note (Signed)
Likely poorly controlled asthma.

## 2017-08-02 NOTE — Assessment & Plan Note (Addendum)
Treat with prednisone taper and albuterol prn.  Unclear trigger.. possible bacterial infection.Marland Kitchen Await CT.  Continue allergy meds.  Likely needs controller med given chronic cough possible poorly controlled asthma.

## 2017-08-04 ENCOUNTER — Ambulatory Visit (INDEPENDENT_AMBULATORY_CARE_PROVIDER_SITE_OTHER): Payer: BLUE CROSS/BLUE SHIELD | Admitting: Internal Medicine

## 2017-08-04 ENCOUNTER — Other Ambulatory Visit (INDEPENDENT_AMBULATORY_CARE_PROVIDER_SITE_OTHER): Payer: BLUE CROSS/BLUE SHIELD

## 2017-08-04 ENCOUNTER — Encounter: Payer: Self-pay | Admitting: Internal Medicine

## 2017-08-04 ENCOUNTER — Telehealth: Payer: Self-pay | Admitting: Family Medicine

## 2017-08-04 VITALS — BP 142/72 | HR 80 | Ht 66.0 in | Wt 188.4 lb

## 2017-08-04 DIAGNOSIS — R042 Hemoptysis: Secondary | ICD-10-CM | POA: Diagnosis not present

## 2017-08-04 DIAGNOSIS — J4530 Mild persistent asthma, uncomplicated: Secondary | ICD-10-CM | POA: Diagnosis not present

## 2017-08-04 DIAGNOSIS — I2510 Atherosclerotic heart disease of native coronary artery without angina pectoris: Secondary | ICD-10-CM

## 2017-08-04 DIAGNOSIS — E7800 Pure hypercholesterolemia, unspecified: Secondary | ICD-10-CM

## 2017-08-04 LAB — LIPID PANEL
CHOL/HDL RATIO: 2
Cholesterol: 168 mg/dL (ref 0–200)
HDL: 101.5 mg/dL (ref >39.00)
LDL Cholesterol: 56 mg/dL (ref 0–99)
NONHDL: 66.56
Triglycerides: 51 mg/dL (ref 0.0–149.0)
VLDL: 10.2 mg/dL (ref 0.0–40.0)

## 2017-08-04 LAB — COMPREHENSIVE METABOLIC PANEL
ALBUMIN: 4.6 g/dL (ref 3.5–5.2)
ALT: 20 U/L (ref 0–35)
AST: 17 U/L (ref 0–37)
Alkaline Phosphatase: 66 U/L (ref 39–117)
BUN: 9 mg/dL (ref 6–23)
CHLORIDE: 99 meq/L (ref 96–112)
CO2: 28 meq/L (ref 19–32)
Calcium: 10.4 mg/dL (ref 8.4–10.5)
Creatinine, Ser: 0.8 mg/dL (ref 0.40–1.20)
GFR: 78.61 mL/min (ref >60.00)
GLUCOSE: 106 mg/dL — AB (ref 70–99)
POTASSIUM: 4.3 meq/L (ref 3.5–5.1)
SODIUM: 134 meq/L — AB (ref 135–145)
Total Bilirubin: 0.3 mg/dL (ref 0.2–1.2)
Total Protein: 7.5 g/dL (ref 6.0–8.3)

## 2017-08-04 NOTE — Telephone Encounter (Signed)
-----   Message from Ellamae Sia sent at 08/03/2017  5:28 PM EDT ----- Regarding: lab orders for 9.27.18 Lab order , she had lab work yesterday... ??

## 2017-08-04 NOTE — Progress Notes (Signed)
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 Pulmonary Consult    Referred by Dr. Eliezer Lofts. Pt c/o cough for the past year, worse x 2 wks- prod with bright red blood.  Cough is worse in the am's, she wakes up and sees blood on her pillow. She has also been having SOB with walking up stairs or at a fast pace. She is using an albuterol inhaler 2 x daily on average.   intermittent cough /wheeze x 1.5 years with nl spirometry during flare and low grade hemoptysis x one year s epistaxis  Feels "all better" only while on prednisone with baseline 5/7 days of the week has minimal symptoms and really Not limited by breathing from desired activities  But 2/7 days some wheeze/doe and cough productive of streaks of blood but nothing purulent Has gerd already on ppi s overt hb  No obvious patterns in  day to day or daytime variability or assoc excess/ purulent sputum or mucus plugs   or cp or chest tightness, subjective wheeze or overt sinus  symptoms. No unusual exp hx or h/o childhood pna/ asthma or knowledge of premature birth.  Sleeping ok flat without nocturnal  or early am exacerbation  of respiratory  c/o's or need for noct saba. Also denies any obvious fluctuation of symptoms with weather or environmental changes or other aggravating or alleviating factors  except as outlined above   Current Allergies, Complete Past Medical History, Past Surgical History, Family History, and Social History were reviewed in [employer] record.  ROS  The following are not active complaints unless bolded sore throat, dysphagia, dental problems, itching, sneezing,  nasal congestion or disharge of excess mucus or purulent secretions, ear ache,   fever, chills, sweats, unintended wt loss or wt gain, classically pleuritic or exertional cp,  orthopnea pnd or leg swelling, presyncope, palpitations, abdominal pain, anorexia, nausea, vomiting, diarrhea  or change in bowel habits or bladder habits, change in stools or change in urine, dysuria, hematuria,  rash, arthralgias, visual complaints, headache, numbness, weakness or ataxia or problems with walking or coordination,  change in mood/affect or memory.        Current Meds  Medication Sig  . apixaban (ELIQUIS) 5 MG TABS tablet Take 1 tablet (5 mg total) by mouth 2 (two) times daily.  . cetirizine (ZYRTEC) 10 MG tablet Take 10 mg by mouth daily.  . cloNIDine (CATAPRES) 0.2 MG tablet TAKE 1 TABLET (0.2 MG TOTAL) BY MOUTH AT BEDTIME.  Marland Kitchen diltiazem (CARDIZEM CD) 240 MG 24 hr capsule TAKE 1 CAPSULE (240 MG TOTAL) BY MOUTH ONCE DAILY.  Marland Kitchen ELIDEL 1 % cream   . fluticasone (FLONASE) 50 MCG/ACT nasal spray USE 1 SPRAY  IN THE NOSE DAILY AS DIRECTED  . levofloxacin (LEVAQUIN) 500 MG tablet Take 1 tablet (500 mg total) by mouth daily.  Marland Kitchen losartan (COZAAR) 100 MG tablet TAKE 1 TABLET (100 MG TOTAL) BY MOUTH DAILY.  . metoprolol succinate (TOPROL-XL) 25 MG 24 hr tablet Take 1 tablet (25 mg total) by mouth daily.  . montelukast (SINGULAIR) 10 MG tablet TAKE 1 TABLET (10 MG TOTAL) BY MOUTH AT BEDTIME.  Marland Kitchen omeprazole (PRILOSEC) 40 MG capsule Take 40 mg by mouth daily.  . predniSONE (DELTASONE) 20 MG tablet 3 tabs by mouth daily x 3 days, then 2 tabs by mouth daily x 2 days then 1 tab by mouth daily x 2 days  .  rosuvastatin (CRESTOR) 20 MG tablet TAKE 1 TABLET (20 MG TOTAL) BY MOUTH DAILY.  Marland Kitchen spironolactone (ALDACTONE) 25 MG tablet TAKE 1 TABLET BY MOUTH DAILY  . VENTOLIN HFA 108 (90 Base) MCG/ACT inhaler INHALE 2 PUFFS INTO THE LUNGS EVERY 6 (SIX) HOURS AS NEEDED FOR WHEEZING.          Review of Systems     Objective:   Physical Exam    amb wf nad   Wt Readings from Last 3 Encounters:  08/04/17 188 lb 6.4 oz (85.5 kg)  08/02/17 190 lb 8 oz (86.4 kg)  03/04/17 199 lb 8 oz (90.5 kg)    Vital signs reviewed - Note on arrival 02 sats  96% on RA     HEENT: nl dentition, turbinates bilaterally, and oropharynx. Nl external ear canals without cough reflex   NECK :  without JVD/Nodes/TM/ nl carotid upstrokes bilaterally   LUNGS: no acc muscle use,  Nl contour chest which is clear to A and P bilaterally without cough on insp or exp maneuvers   CV:  RRR  no s3 or murmur or increase in P2, and no edema   ABD:  soft and nontender with nl inspiratory excursion in the supine position. No bruits or organomegaly appreciated, bowel sounds nl  MS:  Nl gait/ ext warm without deformities, calf tenderness, cyanosis or clubbing No obvious joint restrictions   SKIN: warm and dry without lesions    NEURO:  alert, approp, nl sensorium with  no motor or cerebellar deficits apparent.       Assessment:

## 2017-08-04 NOTE — Patient Instructions (Addendum)
Come to outpatient registration at [facility] (behind the ER) at 730 am Oct 3 with nothing to eat or drink after midnight Tuesday night for Bronchoscopy   Stop eliquis after Monday am and any other time you are actively bleeding

## 2017-08-05 ENCOUNTER — Encounter: Payer: Self-pay | Admitting: Internal Medicine

## 2017-08-05 NOTE — Assessment & Plan Note (Signed)
CT 08/02/17 with RUL as dz/ obst [facility] rx levaquin - rec FOB 08/10/17 >>>  In a never smoker with chronic low grade hemoptysis albeit on eliquis need to be concerned about endobronchial neoplasm, esp carcinoid, in this setting and best way to sort out source is fob  Discussed in detail all the  indications, usual  risks and alternatives  relative to the benefits with patient who agrees to proceed with bronchoscopy with biopsy if needed p off eliquis x 48 h   Total time devoted to counseling  > 50 % of initial 45 min office visit:  review case with pt/ discussion of options/alternatives/ personally creating written customized instructions  in presence of pt  then going over those specific  Instructions directly with the pt including how to use all of the meds but in particular covering each new medication in detail and the difference between the maintenance= "automatic" meds and the prns using an action plan format for the latter (If this problem/symptom => do that organization reading Left to right).  Please see AVS from this visit for a full list of these instructions which I personally wrote for this pt and  are unique to this visit.

## 2017-08-05 NOTE — Assessment & Plan Note (Addendum)
Spirometry 07/13/16 while symptomatic > no obst at all   Not clear this is primary airways dz driven  Vs one of the masqueraders of asthma, esp gerd, but for now controlled with singulair/ albuterol prn > consider further w/u with allergy testing or trial of dulera 100/symb 80 to see if can eliminate symptoms and need for daily saba when return for f/u

## 2017-08-10 ENCOUNTER — Ambulatory Visit (HOSPITAL_COMMUNITY)
Admission: RE | Admit: 2017-08-10 | Discharge: 2017-08-10 | Disposition: A | Discharge status: Home / Self Care | Payer: BLUE CROSS/BLUE SHIELD | Source: Ambulatory Visit | Attending: Internal Medicine | Admitting: Internal Medicine

## 2017-08-10 ENCOUNTER — Encounter (HOSPITAL_COMMUNITY)
Admission: RE | Disposition: A | Discharge status: Home / Self Care | Payer: Self-pay | Source: Ambulatory Visit | Attending: Internal Medicine

## 2017-08-10 DIAGNOSIS — R918 Other nonspecific abnormal finding of lung field: Secondary | ICD-10-CM | POA: Diagnosis present

## 2017-08-10 DIAGNOSIS — Z79899 Other long term (current) drug therapy: Secondary | ICD-10-CM | POA: Insufficient documentation

## 2017-08-10 DIAGNOSIS — Z7901 Long term (current) use of anticoagulants: Secondary | ICD-10-CM | POA: Diagnosis not present

## 2017-08-10 DIAGNOSIS — D491 Neoplasm of unspecified behavior of respiratory system: Secondary | ICD-10-CM | POA: Insufficient documentation

## 2017-08-10 DIAGNOSIS — R846 Abnormal cytological findings in specimens from respiratory organs and thorax: Secondary | ICD-10-CM | POA: Diagnosis not present

## 2017-08-10 DIAGNOSIS — R042 Hemoptysis: Secondary | ICD-10-CM | POA: Diagnosis not present

## 2017-08-10 HISTORY — PX: VIDEO BRONCHOSCOPY: SHX5072

## 2017-08-10 SURGERY — VIDEO BRONCHOSCOPY WITHOUT FLUORO
Anesthesia: Moderate Sedation | Laterality: Bilateral

## 2017-08-10 MED ORDER — MIDAZOLAM HCL 5 MG/ML IJ SOLN: 1.0000 mg | Freq: Once | INTRAMUSCULAR | Status: DC

## 2017-08-10 MED ORDER — PHENYLEPHRINE HCL 0.25 % NA SOLN: 1.0000 | Freq: Four times a day (QID) | NASAL | Status: DC | PRN

## 2017-08-10 MED ORDER — MEPERIDINE HCL 100 MG/ML IJ SOLN: 100.0000 mg | Freq: Once | INTRAMUSCULAR | Status: DC

## 2017-08-10 MED ORDER — LIDOCAINE HCL 2 % EX GEL
CUTANEOUS | Status: DC | PRN
  Administered 2017-08-10: 1

## 2017-08-10 MED ORDER — MEPERIDINE HCL 100 MG/ML IJ SOLN
INTRAMUSCULAR | Status: AC
  Filled 2017-08-10: qty 2

## 2017-08-10 MED ORDER — MIDAZOLAM HCL 10 MG/2ML IJ SOLN
INTRAMUSCULAR | Status: DC | PRN
  Administered 2017-08-10 (×2): 2.5 mg via INTRAVENOUS

## 2017-08-10 MED ORDER — LIDOCAINE HCL 2 % EX GEL: 1.0000 "application " | Freq: Once | CUTANEOUS | Status: DC

## 2017-08-10 MED ORDER — PHENYLEPHRINE HCL 0.25 % NA SOLN
NASAL | Status: DC | PRN
  Administered 2017-08-10: 2 via NASAL

## 2017-08-10 MED ORDER — LIDOCAINE HCL (PF) 1 % IJ SOLN
INTRAMUSCULAR | Status: DC | PRN
  Administered 2017-08-10: 6 mL

## 2017-08-10 MED ORDER — MIDAZOLAM HCL 5 MG/ML IJ SOLN
INTRAMUSCULAR | Status: AC
  Filled 2017-08-10: qty 2

## 2017-08-10 MED ORDER — SODIUM CHLORIDE 0.9 % IV SOLN
INTRAVENOUS | Status: DC
  Administered 2017-08-10: 09:00:00 via INTRAVENOUS

## 2017-08-10 MED ORDER — EPINEPHRINE PF 1 MG/10ML IJ SOSY
PREFILLED_SYRINGE | INTRAMUSCULAR | Status: DC | PRN
  Administered 2017-08-10: 0.5 mg via ENDOTRACHEOPULMONARY

## 2017-08-10 MED ORDER — MEPERIDINE HCL 25 MG/ML IJ SOLN
INTRAMUSCULAR | Status: DC | PRN
  Administered 2017-08-10 (×2): 50 mg via INTRAVENOUS

## 2017-08-10 NOTE — Progress Notes (Signed)
Video bronchoscopy performed Intervention bronchial washings Intervention bronchial biopsy Pt tolerated well  Kathie Dike RRT

## 2017-08-10 NOTE — H&P (Signed)
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 Pulmonary Consult    Referred by Dr. Eliezer Lofts. Pt c/o cough for the past year, worse x 2 wks- prod with bright red blood.  Cough is worse in the am's, she wakes up and sees blood on her pillow. She has also been having SOB with walking up stairs or at a fast pace. She is using an albuterol inhaler 2 x daily on average.   intermittent cough /wheeze x 1.5 years with nl spirometry during flare and low grade hemoptysis x one year s epistaxis  Feels "all better" only while on prednisone with baseline 5/7 days of the week has minimal symptoms and really Not limited by breathing from desired activities  But 2/7 days some wheeze/doe and cough productive of streaks of blood but nothing purulent Has gerd already on ppi s overt hb  No obvious patterns in  day to day or daytime variability or assoc excess/ purulent sputum or mucus plugs   or cp or chest tightness, subjective wheeze or overt sinus  symptoms. No unusual exp hx or h/o childhood pna/ asthma or knowledge of premature birth.  Sleeping ok flat without nocturnal  or early am exacerbation  of respiratory  c/o's or need for noct saba. Also denies any obvious fluctuation of symptoms with weather or environmental changes or other aggravating or alleviating factors  except as outlined above   Current Allergies, Complete Past Medical History, Past Surgical History, Family History, and Social History were reviewed in [employer] record.  ROS  The following are not active complaints unless bolded sore throat, dysphagia, dental problems, itching, sneezing,  nasal congestion or disharge of excess mucus or purulent secretions, ear ache,   fever, chills, sweats, unintended wt loss or wt gain, classically pleuritic or exertional cp,  orthopnea pnd or leg swelling, presyncope, palpitations, abdominal pain, anorexia, nausea, vomiting, diarrhea  or change in bowel habits or bladder habits, change in stools or change in urine, dysuria, hematuria,  rash, arthralgias, visual complaints, headache, numbness, weakness or ataxia or problems with walking or coordination,  change in mood/affect or memory.            Current Meds  Medication Sig  . apixaban (ELIQUIS) 5 MG TABS tablet Take 1 tablet (5 mg total) by mouth 2 (two) times daily.  . cetirizine (ZYRTEC) 10 MG tablet Take 10 mg by mouth daily.  . cloNIDine (CATAPRES) 0.2 MG tablet TAKE 1 TABLET (0.2 MG TOTAL) BY MOUTH AT BEDTIME.  Marland Kitchen diltiazem (CARDIZEM CD) 240 MG 24 hr capsule TAKE 1 CAPSULE (240 MG TOTAL) BY MOUTH ONCE DAILY.  Marland Kitchen ELIDEL 1 % cream   . fluticasone (FLONASE) 50 MCG/ACT nasal spray  USE 1 SPRAY IN THE NOSE DAILY AS DIRECTED  . levofloxacin (LEVAQUIN) 500 MG tablet Take 1 tablet (500 mg total) by mouth daily.  Marland Kitchen losartan (COZAAR) 100 MG tablet TAKE 1 TABLET (100 MG TOTAL) BY MOUTH DAILY.  . metoprolol succinate (TOPROL-XL) 25 MG 24 hr tablet Take 1 tablet (25 mg total) by mouth daily.  . montelukast (SINGULAIR) 10 MG tablet TAKE 1 TABLET (10 MG TOTAL) BY MOUTH AT BEDTIME.  Marland Kitchen omeprazole (PRILOSEC) 40 MG capsule Take 40 mg by mouth daily.  . predniSONE (DELTASONE) 20 MG tablet 3 tabs by mouth daily x 3 days, then 2 tabs by mouth daily x 2 days then 1 tab by mouth daily x 2 days  .  rosuvastatin (CRESTOR) 20 MG tablet TAKE 1 TABLET (20 MG TOTAL) BY MOUTH DAILY.  Marland Kitchen spironolactone (ALDACTONE) 25 MG tablet TAKE 1 TABLET BY MOUTH DAILY  . VENTOLIN HFA 108 (90 Base) MCG/ACT inhaler INHALE 2 PUFFS INTO THE LUNGS EVERY 6 (SIX) HOURS AS NEEDED FOR WHEEZING.          Review of Systems     Objective:   Physical Exam    amb wf nad      Wt Readings from Last 3 Encounters:  08/04/17 188 lb 6.4 oz (85.5 kg)  08/02/17 190 lb 8 oz (86.4 kg)  03/04/17 199 lb 8 oz (90.5 kg)    Vital signs reviewed - Note on arrival 02 sats  96% on RA     HEENT: nl dentition, turbinates bilaterally, and oropharynx. Nl external ear canals without cough reflex   NECK :  without JVD/Nodes/TM/ nl carotid upstrokes bilaterally   LUNGS: no acc muscle use,  Nl contour chest which is clear to A and P bilaterally without cough on insp or exp maneuvers   CV:  RRR  no s3 or murmur or increase in P2, and no edema   ABD:  soft and nontender with nl inspiratory excursion in the supine position. No bruits or organomegaly appreciated, bowel sounds nl  MS:  Nl gait/ ext warm without deformities, calf tenderness, cyanosis or clubbing No obvious joint restrictions   SKIN: warm and dry without lesions    NEURO:  alert, approp, nl sensorium with  no motor or cerebellar deficits apparent.       Assessment:                  Assessment & Plan Note by Tanda Rockers, MD at 08/05/2017 6:38 AM   Author: Tanda Rockers, MD Author Type: Physician Filed: 08/05/2017 6:41 AM  Note Status: Bernell List: Cosign Not Required Encounter Date: 08/04/2017  Problem: Mild persistent asthma  Editor: Tanda Rockers, MD (Physician)  Prior Versions: 1. Tanda Rockers, MD (Physician) at 08/05/2017 6:40 AM - Edited   2. Tanda Rockers, MD (Physician) at 08/05/2017 6:39 AM - Written    Spirometry 07/13/16 while symptomatic > no obst at all   Not clear this is primary airways dz  driven  Vs one of the masqueraders of asthma, esp gerd, but for now controlled with singulair/ albuterol prn > consider further w/u with allergy testing or trial of dulera 100/symb 80 to see if can eliminate symptoms and need for daily saba when return for f/u     Assessment & Plan Note by Tanda Rockers, MD at 08/05/2017 6:35 AM   Author: Tanda Rockers, MD Author Type: Physician Filed: 08/05/2017 6:36 AM  Note Status: Written Cosign:  Cosign Not Required Encounter Date: 08/04/2017  Problem: Hemoptysis  Editor: Tanda Rockers, MD (Physician)    CT 08/02/17 with RUL as dz/ obst LLL rx levaquin - rec FOB 08/10/17 >>>  In a never smoker with chronic low grade hemoptysis albeit on eliquis need to be concerned about endobronchial neoplasm, esp carcinoid, in this setting and best way to sort out source is fob  Discussed in detail all the  indications, usual  risks and alternatives  relative to the benefits with patient who agrees to proceed with bronchoscopy with biopsy if needed p off eliquis x 48 h       08/10/2017   Day of Fob :  No change in hx or exam / still slt steaky hemoptysis esp in am including today. Last dose of Eliquis 9/30 pm    Christinia Gully, MD [facility] [phone] After 5:30 PM or weekends, use [facility]per [phone]

## 2017-08-10 NOTE — Op Note (Signed)
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 The risks, benefits, complications, treatment options and expected outcomes were discussed with the patient.  The possibilities of reaction to medication, pulmonary aspiration, perforation of a viscus, bleeding, failure to diagnose a condition and creating a complication requiring transfusion or operation were discussed with the patient who freely signed the consent.    Description of Procedure: The patient was re-examined in the bronchoscopy suite and the site of surgery properly noted/marked.  The patient was identified  and the procedure verified as Flexible Fiberoptic Bronchoscopy.  A Time Out was held and the above information confirmed.   After the induction of topical nasopharyngeal anesthesia, the patient was positioned  and the bronchoscope was passed through the R naris. The vocal cords were visualized and  1% buffered lidocaine 5 ml was topically placed onto the cords. The cords were nl . The scope was then passed into the trachea.  1% buffered lidocaine given topically. Airways inspected bilaterally to the subsegmental level with the following findings:  All airways widely patent to subsegmental level with nl air dividers with the exception of sup segment LLL where completely obst by polypoid lesion  prolapsing out of the orifice on expiration   Interventions: BAL RLL/ cytology and RLL endobronchial bx  X 4       The Patient was taken to the Endoscopy Recovery area in satisfactory condition.  Attestation: I performed the procedure.  Christinia Gully, MD [facility] [place] [phone] After 5:30 PM or weekends, call [phone]

## 2017-08-10 NOTE — Discharge Instructions (Signed)
Flexible Bronchoscopy, Care After These instructions give you information on caring for yourself after your procedure. Your doctor may also give you more specific instructions. Call your doctor if you have any problems or questions after your procedure. Follow these instructions at home:  Do not eat or drink anything for 2 hours after your procedure. If you try to eat or drink before the medicine wears off, food or drink could go into your lungs. You could also burn yourself.  After 2 hours have passed and when you can cough and gag normally, you may eat soft food and drink liquids slowly.  The day after the test, you may eat your normal diet.  You may do your normal activities.  Keep all doctor visits. Get help right away if:  You get more and more short of breath.  You get light-headed.  You feel like you are going to pass out (faint).  You have chest pain.  You have new problems that worry you.  You cough up more than a little blood.  You cough up more blood than before. This information is not intended to replace advice given to you by your health care provider. Make sure you discuss any questions you have with your health care provider. Document Released: 08/22/2009 Document Revised: 04/01/2016 Document Reviewed: 06/29/2013 Elsevier Interactive Patient Education  2017 [place] not eat or drink until after 1040 today 08/10/17

## 2017-08-11 ENCOUNTER — Encounter (HOSPITAL_COMMUNITY): Payer: Self-pay | Admitting: Internal Medicine

## 2017-08-11 ENCOUNTER — Telehealth: Payer: Self-pay | Admitting: Internal Medicine

## 2017-08-11 NOTE — Telephone Encounter (Signed)
Advised pt that bronch results are not back yet and that MW will call her as soon as it gets back. Nothing further is needed.

## 2017-08-12 ENCOUNTER — Telehealth: Payer: Self-pay | Admitting: Internal Medicine

## 2017-08-12 DIAGNOSIS — R918 Other nonspecific abnormal finding of lung field: Secondary | ICD-10-CM

## 2017-08-12 NOTE — Progress Notes (Signed)
See phone note dated 08/12/17

## 2017-08-12 NOTE — Telephone Encounter (Signed)
MW spoke with the pt today (see result note) and I have made STAT ref to T surg Dr Roxan Hockey

## 2017-08-18 ENCOUNTER — Encounter: Payer: BLUE CROSS/BLUE SHIELD | Admitting: Thoracic Surgery (Cardiothoracic Vascular Surgery)

## 2017-08-24 ENCOUNTER — Telehealth: Payer: Self-pay | Admitting: Internal Medicine

## 2017-08-24 ENCOUNTER — Encounter: Payer: BLUE CROSS/BLUE SHIELD | Admitting: Thoracic Surgery (Cardiothoracic Vascular Surgery)

## 2017-08-24 NOTE — Telephone Encounter (Signed)
Spoke with the pt and notified of recs per MW  She verbalized understanding and will keep appt with Roxan Hockey  Nothing further needed

## 2017-08-24 NOTE — Telephone Encounter (Signed)
Spoke with the pt She states that Dr Hendrickson's office has already rescheduled her consult x 2  She states she is upset about this and therefore wants to be referred somewhere else  She wants to see Dr. Wayna Chalet at [facility]  She is scheduled with Roxan Hockey on 08/26/17  I advised it would be ideal for her to just keep this one if she can, b/c it would prob take longer to get in with the other doc since it's a brand new referral  She still wants to see what Dr Melvyn Novas thinks she should do  Please advise thanks!

## 2017-08-24 NOTE — Telephone Encounter (Signed)
Sorry for the delay but best to keep this appt since I know Dr Roxan Hockey and have had great success sending him pts but don't know the doc she mentioned  Also let her know I discussed her case directly with Dr Roxan Hockey including my findings so he's already up to speed.

## 2017-08-26 ENCOUNTER — Institutional Professional Consult (permissible substitution) (INDEPENDENT_AMBULATORY_CARE_PROVIDER_SITE_OTHER): Payer: BLUE CROSS/BLUE SHIELD | Admitting: Thoracic Surgery (Cardiothoracic Vascular Surgery)

## 2017-08-26 ENCOUNTER — Encounter: Payer: Self-pay | Admitting: Thoracic Surgery (Cardiothoracic Vascular Surgery)

## 2017-08-26 ENCOUNTER — Other Ambulatory Visit: Payer: Self-pay

## 2017-08-26 VITALS — BP 158/82 | HR 79 | Resp 16 | Ht 66.0 in | Wt 184.0 lb

## 2017-08-26 DIAGNOSIS — R918 Other nonspecific abnormal finding of lung field: Secondary | ICD-10-CM

## 2017-08-26 NOTE — H&P (View-Only) (Signed)
PCP is Jinny Sanders, MD Referring Provider is Tanda Rockers, MD  Chief Complaint  Patient presents with  . Lung Mass    or obstruction LLLobe per CT CHEST 9/25.Marland KitchenMarland KitchenINCONCLUSIVE VIDEO [facility] 08/10/17    HPI: Amanda Estrada is a 56 year old woman sent for consultation regarding an endobronchial mass lesion.  Amanda Estrada is a 56 year old lifelong nonsmoker with a past medical history significant for hypertension, paroxysmal atrial fibrillation, obesity, asthma, and anxiety. She started having problems back in September 2017. She developed intermittent hemoptysis. She was on apixaban for atrial fibrillation. She continued to have coughing and wheezing and was treated with bronchodilators. She would intermittently cough up blood. In September 2018 she developed a more severe coughing more hemoptysis. She was treated for bronchitis but also Dr. Diona Browner recommended a CT of the chest. That showed an endobronchial mass in the superior segmental bronchus of the left lower lobe.there was some patchy airspace opacity in the right upper lobe.  She was referred to Dr. Melvyn Novas who did a bronchoscopy on 08/10/2017. He saw polypoid smooth surfaced tumor obstructing the superior segment of the left lower lobe. Biopsies were nondiagnostic.  She has not had any further hemoptysis since stopping apixaban. She had her husband are scheduled to go on a [place] cruise beginning in 8 days.  Zubrod Score: At the time of surgery this patient's most appropriate activity status/level should be described as: [x]     0    Normal activity, no symptoms []     1    Restricted in physical strenuous activity but ambulatory, able to do out light work []     2    Ambulatory and capable of self care, unable to do work activities, up and about >50 % of waking hours                              []     3    Only limited self care, in bed greater than 50% of waking hours []     4    Completely disabled, no self care, confined to bed or  chair []     5    Moribund  Past Medical History:  Diagnosis Date  . Allergy   . Anxiety   . Asthma   . History of echocardiogram    a. 11/2015: EF 55-60%, no RWMA, nl LV diastolic fxn, PASP nl  . Hypertension   . Obesity   . PAF (paroxysmal atrial fibrillation) ([place])    a. CHADS2VASc = 2 (HTN, sex category)  . Sinusitis 2011   Recurrent [facility] T[facility]    Past Surgical History:  Procedure Laterality Date  . BUNIONECTOMY  11/2007   Right foot, 2 hammer-toes and tendon replacement (Dr. Paulla Dolly)  . CERVICAL DISCECTOMY  2001   C5/6  . CESAREAN SECTION  1985   Due to incr BP  . ENDOMETRIAL BIOPSY  03/27/2008   B9 endometrium w/breakdown changes (Dr, Kinscius)  . NSVD  1989   VBAC  . Renal artery [place]  02/2007   [place] arteries but no stenosis  . US TRANSVAGINAL PELVIC MODIFIED  04/09/2008   Normal  . VIDEO BRONCHOSCOPY Bilateral 08/10/2017   Procedure: VIDEO BRONCHOSCOPY WITHOUT FLUORO;  Surgeon: Tanda Rockers, MD;  Location: [facility];  Service: Cardiopulmonary;  Laterality: Bilateral;    Family History  Problem Relation Age of Onset  . Cancer Mother  Colon, partial colectomy, melanoma x 2  . Colon cancer Mother 7  . Heart disease Father        CHF  . Hypertension Father   . Cancer Father        Colon  . Pneumonia Father   . Colon cancer Father 24  . Colon polyps Sister   . Hypertension Brother   . Colon polyps Brother   . Hypertension Sister   . Colon polyps Sister   . Colon polyps Sister   . Esophageal cancer Neg Hx   . Rectal cancer Neg Hx   . Stomach cancer Neg Hx     Social History Social History  Substance Use Topics  . Smoking status: Never Smoker  . Smokeless tobacco: Never Used  . Alcohol use 8.4 oz/week    14 Glasses of wine per week     Comment: 2 glasses of wine every day    Current Outpatient Prescriptions  Medication Sig Dispense Refill  . cetirizine (ZYRTEC) 10 MG tablet Take 10 mg by mouth daily.    .  cloNIDine (CATAPRES) 0.1 MG tablet Take 0.1 mg by mouth at bedtime.    Marland Kitchen diltiazem (CARDIZEM CD) 240 MG 24 hr capsule TAKE 1 CAPSULE (240 MG TOTAL) BY MOUTH ONCE DAILY.  11  . ELIDEL 1 % cream Apply 1 application topically daily as needed.   2  . fluticasone (FLONASE) 50 MCG/ACT nasal spray USE 1 SPRAY IN THE NOSE DAILY AS DIRECTED 16 g 2  . losartan (COZAAR) 100 MG tablet TAKE 1 TABLET (100 MG TOTAL) BY MOUTH DAILY. 90 tablet 3  . metoprolol succinate (TOPROL-XL) 25 MG 24 hr tablet Take 1 tablet (25 mg total) by mouth daily. (Patient taking differently: Take 25 mg by mouth daily as needed (for AFIB). ) 90 tablet 3  . montelukast (SINGULAIR) 10 MG tablet TAKE 1 TABLET (10 MG TOTAL) BY MOUTH AT BEDTIME. 90 tablet 3  . omeprazole (PRILOSEC) 40 MG capsule Take 40 mg by mouth daily.    . rosuvastatin (CRESTOR) 20 MG tablet TAKE 1 TABLET (20 MG TOTAL) BY MOUTH DAILY. 90 tablet 3  . spironolactone (ALDACTONE) 25 MG tablet TAKE 1 TABLET BY MOUTH DAILY  11  . VENTOLIN HFA 108 (90 Base) MCG/ACT inhaler INHALE 2 PUFFS INTO THE LUNGS EVERY 6 (SIX) HOURS AS NEEDED FOR WHEEZING. 18 Inhaler 1   No current facility-administered medications for this visit.     Allergies  Allergen Reactions  . Amoxicillin-Pot Clavulanate Other (See Comments)    Diarrhea Has patient had a PCN reaction causing immediate rash, facial/tongue/throat swelling, SOB or lightheadedness with hypotension: No Has patient had a PCN reaction causing severe rash involving mucus membranes or skin necrosis: No Has patient had a PCN reaction that required hospitalization: No Has patient had a PCN reaction occurring within the last 10 years: Yes--diarrhea ONLY If all of the above answers are "NO", then may proceed with Cephalosporin use.     Review of Systems  Constitutional: Negative for activity change, chills and unexpected weight change.  HENT: Negative for trouble swallowing and voice change.   Eyes: Negative for visual disturbance.   Respiratory: Positive for cough, shortness of breath and wheezing. Negative for chest tightness.   Cardiovascular: Positive for palpitations (with atrial fibrillation). Negative for chest pain and leg swelling.  Gastrointestinal: Positive for abdominal pain (reflux).  Genitourinary: Negative for difficulty urinating and dysuria.  Musculoskeletal: Negative for arthralgias and myalgias.  Neurological: Negative for syncope and  weakness.  Hematological: Negative for adenopathy. Does not bruise/bleed easily.  Psychiatric/Behavioral: The patient is nervous/anxious.   All other systems reviewed and are negative.   BP (!) 158/82 (BP Location: Right Arm, Patient Position: Sitting, Cuff Size: Large)   Pulse 79   Resp 16   Ht 5\' 6"  (1.676 m)   Wt 184 lb (83.5 kg)   SpO2 99% Comment: ON RA  BMI 29.70 kg/m  Physical Exam  Constitutional: She is oriented to person, place, and time. She appears well-developed and well-nourished. No distress.  HENT:  Head: Normocephalic and atraumatic.  Mouth/Throat: No oropharyngeal exudate.  Eyes: Conjunctivae and EOM are normal. No scleral icterus.  Neck: Neck supple. No thyromegaly present.  Cardiovascular: Normal rate, regular rhythm, normal heart sounds and intact distal pulses.  Exam reveals no gallop and no friction rub.   No murmur heard. Pulmonary/Chest: Effort normal and breath sounds normal. No respiratory distress. She has no wheezes. She has no rales.  Abdominal: Soft. She exhibits no distension. There is no tenderness.  Musculoskeletal: She exhibits no edema.  Lymphadenopathy:    She has no cervical adenopathy.  Neurological: She is alert and oriented to person, place, and time. No cranial nerve deficit.  Motor grossly intact  Skin: Skin is warm and dry.  Vitals reviewed.    Diagnostic Tests: CT CHEST WITHOUT CONTRAST  TECHNIQUE: Multidetector CT imaging of the chest was performed following the standard protocol without IV  contrast.  COMPARISON:  08/06/2015  FINDINGS: Cardiovascular: Normal heart size. No pericardial effusion. Diffuse coronary atherosclerotic calcification. Status post calcium scoring in 2016. No acute vascular finding.  Mediastinum/Nodes: Negative for mass, adenopathy, or inflammation.  Lungs/Pleura: There is generalized airway thickening, also seen in 2016. This could be acute or chronic bronchitis. There is mild patchy airspace opacity in the right upper lobe. No underlying bronchiectasis or lesion seen. There is focal airway obstruction involving the superior segment left lower lobe bronchus, with soft tissue density bulging into the proximal airway. No generalized alveolar hemorrhage. No mass or cavity.  Upper Abdomen: No acute finding. Peripheral atherosclerotic calcification at the splenic hilum. The vessels are tortuous in this area, limiting detection of aneurysm. Vessel diameter measures up to 9 mm at most.  Musculoskeletal: No acute or aggressive finding.  Partly seen ACDF.  IMPRESSION: 1. Mild airspace disease in the right upper lobe which could reflect hemorrhage or infection in this setting. 2. Prominent generalized bronchitic type airway thickening. 3. Obstructed superior segment left lower lobe bronchus from mucous plugging or lesion. Suggest short follow-up CT after airway clearance if bronchoscopy is not already planned. 4. No bronchiectasis, cavity, or diffuse alveolar hemorrhage. 5. Coronary atherosclerosis that is extensive for age.   Electronically Signed   By: Monte Fantasia M.D.   On: 08/02/2017 14:16  I personally reviewed the CT chest with the findings noted above  Impression: Mrs. Amanda Estrada is a 57 year old nonsmoker who has been having problems with coughing and hemoptysis for a little over a year now. She has a 7 mm endobronchial mass in the superior segment of the left lower lobe. This most likely is a carcinoid tumor although  bronchogenic carcinoma, adenoid cystic carcinoma and other tumors are less likely possibilities.  I discussed the potential options with Mrs. Lanell Matar and her husband. We discussed the possibility of attempting and endobronchial resection versus a VATS for lung resection. We described the advantages and disadvantages of each of those approaches. They do understand there is a higher risk of  recurrence with an endobronchial procedure. .  We discussed the proposed procedure of bronchoscopy for resection of endobronchial tumor and possible laser bronchoscopy. They understand this would be an endoscopic procedure on an outpatient basis. There is no guarantee we will be able to resect the nodule endobronchially. They understand the risks include those associated with general anesthesia. Procedure specific risks include pneumothorax, bleeding,incomplete resection, tumor recurrence.  After taking over the options she would like to at least attempt an endobronchial resection initially. She would like to wait until after she and her husband returned from their [place] vacation on November 7.  Plan: Bronchoscopy, possible endobronchial tumor resection, possible laser bronchoscopy on Monday, 09/19/2017.  Melrose Nakayama, MD [facility] [phone]

## 2017-08-26 NOTE — Progress Notes (Signed)
PCP is Jinny Sanders, MD Referring Provider is Tanda Rockers, MD  Chief Complaint  Patient presents with  . Lung Mass    or obstruction LLLobe per CT CHEST 9/25.Marland KitchenMarland KitchenINCONCLUSIVE VIDEO [facility] 08/10/17    HPI: Amanda Estrada is a 55 year old woman sent for consultation regarding an endobronchial mass lesion.  Amanda Estrada is a 56 year old lifelong nonsmoker with a past medical history significant for hypertension, paroxysmal atrial fibrillation, obesity, asthma, and anxiety. She started having problems back in September 2017. She developed intermittent hemoptysis. She was on apixaban for atrial fibrillation. She continued to have coughing and wheezing and was treated with bronchodilators. She would intermittently cough up blood. In September 2018 she developed a more severe coughing more hemoptysis. She was treated for bronchitis but also Dr. Diona Browner recommended a CT of the chest. That showed an endobronchial mass in the superior segmental bronchus of the left lower lobe.there was some patchy airspace opacity in the right upper lobe.  She was referred to Dr. Melvyn Novas who did a bronchoscopy on 08/10/2017. He saw polypoid smooth surfaced tumor obstructing the superior segment of the left lower lobe. Biopsies were nondiagnostic.  She has not had any further hemoptysis since stopping apixaban. She had her husband are scheduled to go on a [place] cruise beginning in 8 days.  Zubrod Score: At the time of surgery this patient's most appropriate activity status/level should be described as: [x]     0    Normal activity, no symptoms []     1    Restricted in physical strenuous activity but ambulatory, able to do out light work []     2    Ambulatory and capable of self care, unable to do work activities, up and about >50 % of waking hours                              []     3    Only limited self care, in bed greater than 50% of waking hours []     4    Completely disabled, no self care, confined to bed or  chair []     5    Moribund  Past Medical History:  Diagnosis Date  . Allergy   . Anxiety   . Asthma   . History of echocardiogram    a. 11/2015: EF 55-60%, no RWMA, nl LV diastolic fxn, PASP nl  . Hypertension   . Obesity   . PAF (paroxysmal atrial fibrillation) ([place])    a. CHADS2VASc = 2 (HTN, sex category)  . Sinusitis 2011   Recurrent per Dr. Tami Ribas with [facility]    Past Surgical History:  Procedure Laterality Date  . BUNIONECTOMY  11/2007   Right foot, 2 hammer-toes and tendon replacement (Dr. Paulla Dolly)  . CERVICAL DISCECTOMY  2001   C5/6  . CESAREAN SECTION  1985   Due to incr BP  . ENDOMETRIAL BIOPSY  03/27/2008   B9 endometrium w/breakdown changes (Dr, Kinscius)  . NSVD  1989   VBAC  . Renal artery [place]  02/2007   [place] arteries but no stenosis  . US TRANSVAGINAL PELVIC MODIFIED  04/09/2008   Normal  . VIDEO BRONCHOSCOPY Bilateral 08/10/2017   Procedure: VIDEO BRONCHOSCOPY WITHOUT FLUORO;  Surgeon: Tanda Rockers, MD;  Location: [facility];  Service: Cardiopulmonary;  Laterality: Bilateral;    Family History  Problem Relation Age of Onset  . Cancer Mother  Colon, partial colectomy, melanoma x 2  . Colon cancer Mother 53  . Heart disease Father        CHF  . Hypertension Father   . Cancer Father        Colon  . Pneumonia Father   . Colon cancer Father 8  . Colon polyps Sister   . Hypertension Brother   . Colon polyps Brother   . Hypertension Sister   . Colon polyps Sister   . Colon polyps Sister   . Esophageal cancer Neg Hx   . Rectal cancer Neg Hx   . Stomach cancer Neg Hx     Social History Social History  Substance Use Topics  . Smoking status: Never Smoker  . Smokeless tobacco: Never Used  . Alcohol use 8.4 oz/week    14 Glasses of wine per week     Comment: 2 glasses of wine every day    Current Outpatient Prescriptions  Medication Sig Dispense Refill  . cetirizine (ZYRTEC) 10 MG tablet Take 10 mg by mouth daily.    .  cloNIDine (CATAPRES) 0.1 MG tablet Take 0.1 mg by mouth at bedtime.    Marland Kitchen diltiazem (CARDIZEM CD) 240 MG 24 hr capsule TAKE 1 CAPSULE (240 MG TOTAL) BY MOUTH ONCE DAILY.  11  . ELIDEL 1 % cream Apply 1 application topically daily as needed.   2  . fluticasone (FLONASE) 50 MCG/ACT nasal spray USE 1 SPRAY IN THE NOSE DAILY AS DIRECTED 16 g 2  . losartan (COZAAR) 100 MG tablet TAKE 1 TABLET (100 MG TOTAL) BY MOUTH DAILY. 90 tablet 3  . metoprolol succinate (TOPROL-XL) 25 MG 24 hr tablet Take 1 tablet (25 mg total) by mouth daily. (Patient taking differently: Take 25 mg by mouth daily as needed (for AFIB). ) 90 tablet 3  . montelukast (SINGULAIR) 10 MG tablet TAKE 1 TABLET (10 MG TOTAL) BY MOUTH AT BEDTIME. 90 tablet 3  . omeprazole (PRILOSEC) 40 MG capsule Take 40 mg by mouth daily.    . rosuvastatin (CRESTOR) 20 MG tablet TAKE 1 TABLET (20 MG TOTAL) BY MOUTH DAILY. 90 tablet 3  . spironolactone (ALDACTONE) 25 MG tablet TAKE 1 TABLET BY MOUTH DAILY  11  . VENTOLIN HFA 108 (90 Base) MCG/ACT inhaler INHALE 2 PUFFS INTO THE LUNGS EVERY 6 (SIX) HOURS AS NEEDED FOR WHEEZING. 18 Inhaler 1   No current facility-administered medications for this visit.     Allergies  Allergen Reactions  . Amoxicillin-Pot Clavulanate Other (See Comments)    Diarrhea Has patient had a PCN reaction causing immediate rash, facial/tongue/throat swelling, SOB or lightheadedness with hypotension: No Has patient had a PCN reaction causing severe rash involving mucus membranes or skin necrosis: No Has patient had a PCN reaction that required hospitalization: No Has patient had a PCN reaction occurring within the last 10 years: Yes--diarrhea ONLY If all of the above answers are "NO", then may proceed with Cephalosporin use.     Review of Systems  Constitutional: Negative for activity change, chills and unexpected weight change.  HENT: Negative for trouble swallowing and voice change.   Eyes: Negative for visual disturbance.   Respiratory: Positive for cough, shortness of breath and wheezing. Negative for chest tightness.   Cardiovascular: Positive for palpitations (with atrial fibrillation). Negative for chest pain and leg swelling.  Gastrointestinal: Positive for abdominal pain (reflux).  Genitourinary: Negative for difficulty urinating and dysuria.  Musculoskeletal: Negative for arthralgias and myalgias.  Neurological: Negative for syncope and  weakness.  Hematological: Negative for adenopathy. Does not bruise/bleed easily.  Psychiatric/Behavioral: The patient is nervous/anxious.   All other systems reviewed and are negative.   BP (!) 158/82 (BP Location: Right Arm, Patient Position: Sitting, Cuff Size: Large)   Pulse 79   Resp 16   Ht 5\' 6"  (1.676 m)   Wt 184 lb (83.5 kg)   SpO2 99% Comment: ON RA  BMI 29.70 kg/m  Physical Exam  Constitutional: She is oriented to person, place, and time. She appears well-developed and well-nourished. No distress.  HENT:  Head: Normocephalic and atraumatic.  Mouth/Throat: No oropharyngeal exudate.  Eyes: Conjunctivae and EOM are normal. No scleral icterus.  Neck: Neck supple. No thyromegaly present.  Cardiovascular: Normal rate, regular rhythm, normal heart sounds and intact distal pulses.  Exam reveals no gallop and no friction rub.   No murmur heard. Pulmonary/Chest: Effort normal and breath sounds normal. No respiratory distress. She has no wheezes. She has no rales.  Abdominal: Soft. She exhibits no distension. There is no tenderness.  Musculoskeletal: She exhibits no edema.  Lymphadenopathy:    She has no cervical adenopathy.  Neurological: She is alert and oriented to person, place, and time. No cranial nerve deficit.  Motor grossly intact  Skin: Skin is warm and dry.  Vitals reviewed.    Diagnostic Tests: CT CHEST WITHOUT CONTRAST  TECHNIQUE: Multidetector CT imaging of the chest was performed following the standard protocol without IV  contrast.  COMPARISON:  08/06/2015  FINDINGS: Cardiovascular: Normal heart size. No pericardial effusion. Diffuse coronary atherosclerotic calcification. Status post calcium scoring in 2016. No acute vascular finding.  Mediastinum/Nodes: Negative for mass, adenopathy, or inflammation.  Lungs/Pleura: There is generalized airway thickening, also seen in 2016. This could be acute or chronic bronchitis. There is mild patchy airspace opacity in the right upper lobe. No underlying bronchiectasis or lesion seen. There is focal airway obstruction involving the superior segment left lower lobe bronchus, with soft tissue density bulging into the proximal airway. No generalized alveolar hemorrhage. No mass or cavity.  Upper Abdomen: No acute finding. Peripheral atherosclerotic calcification at the splenic hilum. The vessels are tortuous in this area, limiting detection of aneurysm. Vessel diameter measures up to 9 mm at most.  Musculoskeletal: No acute or aggressive finding.  Partly seen ACDF.  IMPRESSION: 1. Mild airspace disease in the right upper lobe which could reflect hemorrhage or infection in this setting. 2. Prominent generalized bronchitic type airway thickening. 3. Obstructed superior segment left lower lobe bronchus from mucous plugging or lesion. Suggest short follow-up CT after airway clearance if bronchoscopy is not already planned. 4. No bronchiectasis, cavity, or diffuse alveolar hemorrhage. 5. Coronary atherosclerosis that is extensive for age.   Electronically Signed   By: Monte Fantasia M.D.   On: 08/02/2017 14:16  I personally reviewed the CT chest with the findings noted above  Impression: Amanda Estrada is a 56 year old nonsmoker who has been having problems with coughing and hemoptysis for a little over a year now. She has a 7 mm endobronchial mass in the superior segment of the left lower lobe. This most likely is a carcinoid tumor although  bronchogenic carcinoma, adenoid cystic carcinoma and other tumors are less likely possibilities.  I discussed the potential options with Amanda Estrada and her husband. We discussed the possibility of attempting and endobronchial resection versus a VATS for lung resection. We described the advantages and disadvantages of each of those approaches. They do understand there is a higher risk of  recurrence with an endobronchial procedure. .  We discussed the proposed procedure of bronchoscopy for resection of endobronchial tumor and possible laser bronchoscopy. They understand this would be an endoscopic procedure on an outpatient basis. There is no guarantee we will be able to resect the nodule endobronchially. They understand the risks include those associated with general anesthesia. Procedure specific risks include pneumothorax, bleeding,incomplete resection, tumor recurrence.  After taking over the options she would like to at least attempt an endobronchial resection initially. She would like to wait until after she and her husband returned from their [place] vacation on November 7.  Plan: Bronchoscopy, possible endobronchial tumor resection, possible laser bronchoscopy on Monday, 09/19/2017.  Melrose Nakayama, MD [facility] [phone]

## 2017-08-29 ENCOUNTER — Other Ambulatory Visit: Payer: Self-pay | Admitting: Family Medicine

## 2017-08-29 NOTE — Progress Notes (Signed)
Thanks for the update! Thank you to both of you for taking care of her!  Amanda Estrada

## 2017-09-14 NOTE — Pre-Procedure Instructions (Signed)
Amanda Estrada  09/14/2017      [id number] [address], [place] [address] [place] [address] Phone: [phone] Fax: [phone]    Your procedure is scheduled on November 12  Report to [facility] at 0700 A.M.  Call this number if you have problems the morning of surgery:  [phone]   Remember:  Do not eat food or drink liquids after midnight.  Continue all other medications as directed by your physician except follow these medication instructions before surgery   Take these medicines the morning of surgery with A SIP OF WATER  albuterol (VENTOLIN HFA)  cetirizine (ZYRTEC diltiazem (CARDIZEM CD) fluticasone (FLONASE) metoprolol succinate (TOPROL-XL) omeprazole (PRILOSEC)  7 days prior to surgery STOP taking any Aspirin (unless otherwise instructed by your surgeon), Aleve, Naproxen, Ibuprofen, Motrin, Advil, Goody's, BC's, all herbal medications, fish oil, and all vitamins    Do not wear jewelry, make-up or nail polish.  Do not wear lotions, powders, or perfumes, or deoderant.  Do not shave 48 hours prior to surgery.   Do not bring valuables to the hospital.  [facility] is not responsible for any belongings or valuables.  Contacts, dentures or bridgework may not be worn into surgery.  Leave your suitcase in the car.  After surgery it may be brought to your room.  For patients admitted to the hospital, discharge time will be determined by your treatment team.  Patients discharged the day of surgery will not be allowed to drive home.    Special instructions:   [facility]- Preparing For Surgery  Before surgery, you can play an important role. Because skin is not sterile, your skin needs to be as free of germs as possible. You can reduce the number of germs on your skin by washing with CHG (chlorahexidine gluconate) Soap before surgery.  CHG is an antiseptic cleaner which kills germs and bonds with the  skin to continue killing germs even after washing.  Please do not use if you have an allergy to CHG or antibacterial soaps. If your skin becomes reddened/irritated stop using the CHG.  Do not shave (including legs and underarms) for at least 48 hours prior to first CHG shower. It is OK to shave your face.  Please follow these instructions carefully.   1. Shower the NIGHT BEFORE SURGERY and the MORNING OF SURGERY with CHG.   2. If you chose to wash your hair, wash your hair first as usual with your normal shampoo.  3. After you shampoo, rinse your hair and body thoroughly to remove the shampoo.  4. Use CHG as you would any other liquid soap. You can apply CHG directly to the skin and wash gently with a scrungie or a clean washcloth.   5. Apply the CHG Soap to your body ONLY FROM THE NECK DOWN.  Do not use on open wounds or open sores. Avoid contact with your eyes, ears, mouth and genitals (private parts). Wash Face and genitals (private parts)  with your normal soap.  6. Wash thoroughly, paying special attention to the area where your surgery will be performed.  7. Thoroughly rinse your body with warm water from the neck down.  8. DO NOT shower/wash with your normal soap after using and rinsing off the CHG Soap.  9. Pat yourself dry with a CLEAN TOWEL.  10. Wear CLEAN PAJAMAS to bed the night before surgery, wear comfortable clothes the morning of surgery  11.  Place CLEAN SHEETS on your bed the night of your first shower and DO NOT SLEEP WITH PETS.    Day of Surgery: Do not apply any deodorants/lotions. Please wear clean clothes to the hospital/surgery center.      Please read over the following fact sheets that you were given.

## 2017-09-15 ENCOUNTER — Encounter (HOSPITAL_COMMUNITY): Payer: Self-pay

## 2017-09-15 ENCOUNTER — Encounter (HOSPITAL_COMMUNITY)
Admission: RE | Admit: 2017-09-15 | Discharge: 2017-09-15 | Disposition: A | Discharge status: Home / Self Care | Payer: BLUE CROSS/BLUE SHIELD | Source: Ambulatory Visit | Attending: Thoracic Surgery (Cardiothoracic Vascular Surgery) | Admitting: Thoracic Surgery (Cardiothoracic Vascular Surgery)

## 2017-09-15 ENCOUNTER — Other Ambulatory Visit: Payer: Self-pay

## 2017-09-15 DIAGNOSIS — Z7901 Long term (current) use of anticoagulants: Secondary | ICD-10-CM | POA: Insufficient documentation

## 2017-09-15 DIAGNOSIS — J45909 Unspecified asthma, uncomplicated: Secondary | ICD-10-CM | POA: Insufficient documentation

## 2017-09-15 DIAGNOSIS — I10 Essential (primary) hypertension: Secondary | ICD-10-CM | POA: Diagnosis not present

## 2017-09-15 DIAGNOSIS — Z79899 Other long term (current) drug therapy: Secondary | ICD-10-CM | POA: Diagnosis not present

## 2017-09-15 DIAGNOSIS — I481 Persistent atrial fibrillation: Secondary | ICD-10-CM | POA: Insufficient documentation

## 2017-09-15 DIAGNOSIS — Z01810 Encounter for preprocedural cardiovascular examination: Secondary | ICD-10-CM | POA: Insufficient documentation

## 2017-09-15 DIAGNOSIS — R918 Other nonspecific abnormal finding of lung field: Secondary | ICD-10-CM

## 2017-09-15 DIAGNOSIS — Z01812 Encounter for preprocedural laboratory examination: Secondary | ICD-10-CM | POA: Diagnosis not present

## 2017-09-15 LAB — TYPE AND SCREEN
ABO/RH(D): A POS
Antibody Screen: NEGATIVE

## 2017-09-15 LAB — COMPREHENSIVE METABOLIC PANEL
ALT: 43 U/L (ref 14–54)
ANION GAP: 9 (ref 5–15)
AST: 35 U/L (ref 15–41)
Albumin: 4.4 g/dL (ref 3.5–5.0)
Alkaline Phosphatase: 73 U/L (ref 38–126)
BILIRUBIN TOTAL: 0.7 mg/dL (ref 0.3–1.2)
BUN: 9 mg/dL (ref 6–20)
CALCIUM: 9.5 mg/dL (ref 8.9–10.3)
CO2: 22 mmol/L (ref 22–32)
Chloride: 101 mmol/L (ref 101–111)
Creatinine, Ser: 0.82 mg/dL (ref 0.44–1.00)
GFR calc Af Amer: >60 mL/min (ref >60)
Glucose, Bld: 115 mg/dL — ABNORMAL HIGH (ref 65–99)
POTASSIUM: 3.7 mmol/L (ref 3.5–5.1)
Sodium: 132 mmol/L — ABNORMAL LOW (ref 135–145)
TOTAL PROTEIN: 7.3 g/dL (ref 6.5–8.1)

## 2017-09-15 LAB — CBC
HEMATOCRIT: 38.7 % (ref 36.0–46.0)
Hemoglobin: 12.9 g/dL (ref 12.0–15.0)
MCH: 30.9 pg (ref 26.0–34.0)
MCHC: 33.3 g/dL (ref 30.0–36.0)
MCV: 92.8 fL (ref 78.0–100.0)
Platelets: 264 10*3/uL (ref 150–400)
RBC: 4.17 MIL/uL (ref 3.87–5.11)
RDW: 13.3 % (ref 11.5–15.5)
WBC: 7.6 10*3/uL (ref 4.0–10.5)

## 2017-09-15 LAB — ABO/RH: ABO/RH(D): A POS

## 2017-09-15 LAB — APTT: APTT: 24 s (ref 24–36)

## 2017-09-15 LAB — PROTIME-INR
INR: 0.91
PROTHROMBIN TIME: 12.2 s (ref 11.4–15.2)

## 2017-09-15 LAB — SURGICAL PCR SCREEN
MRSA, PCR: NEGATIVE
STAPHYLOCOCCUS AUREUS: NEGATIVE

## 2017-09-15 NOTE — Progress Notes (Addendum)
PCP - [facility] Cardiologist - Harlow [facility] and [facility] in [place]  Chest x-ray - will need DOS EKG - 09/15/17 Stress Test - 10 years ago ECHO - 11/2015 Cardiac Cath - denies  Sending to anesthesia for review of cardiac history  Patient was on eliquis but was taken off for last bronchoscopy back in October and was not restarted per dr wert pulmonology   Patient denies shortness of breath, fever, cough and chest pain at PAT appointment   Patient verbalized understanding of instructions that were given to them at the PAT appointment. Patient was also instructed that they will need to review over the PAT instructions again at home before surgery.

## 2017-09-16 NOTE — Progress Notes (Signed)
Anesthesia Chart Review:  Pt is a 56 year old female scheduled for video bronchoscopy, possible endobronchial tumor resection, possible laser bronchoscopy on 09/19/2017 with Modesto Charon, MD  - PCP is Eliezer Lofts, MD - Has seen cardiologist Ida Rogue, MD (last office visit note 10/18/16 in Epic) and Harlow Ohms, MD (last office visit note 12/08/16 in care everywhere) - Pulmonologist is Christinia Gully, MD  PMH includes:  PAF, HTN, asthma. Never smoker. BMI 30.   Medications include: albuterol, clonidine, diltiazem, losartan, metoprolol, prilosec, rosuvastatin, spironolactone. Pt stopped eliquis prior to bronchoscopy 08/10/17.   CT chest 08/02/17:  1. Mild airspace disease in the right upper lobe which could reflect hemorrhage or infection in this setting. 2. Prominent generalized bronchitic type airway thickening. 3. Obstructed superior segment left lower lobe bronchus from mucous plugging or lesion. Suggest short follow-up CT after airway clearance if bronchoscopy is not already planned. 4. No bronchiectasis, cavity, or diffuse alveolar hemorrhage. 5. Coronary atherosclerosis that is extensive for age.  EKG 09/15/17: NSR  Echo 11/24/15:  - Left ventricle: The cavity size was normal. Systolic function was normal. The estimated ejection fraction was in the range of 55% to 60%. Wall motion was normal; there were no regional wall motion abnormalities. Left ventricular diastolic function parameters were normal. - Left atrium: The atrium was normal in size. - Right ventricle: Systolic function was normal. - Pulmonary arteries: Systolic pressure was within the normal range.  If no changes, I anticipate pt can proceed with surgery as scheduled.   Willeen Cass, FNP-BC [facility] Stay Surgical [facility]/Anesthesiology Phone: [phone] 09/16/2017 10:19 AM

## 2017-09-19 ENCOUNTER — Encounter (HOSPITAL_COMMUNITY)
Admission: RE | Disposition: A | Discharge status: Home / Self Care | Payer: Self-pay | Source: Ambulatory Visit | Attending: Thoracic Surgery (Cardiothoracic Vascular Surgery)

## 2017-09-19 ENCOUNTER — Encounter (HOSPITAL_COMMUNITY): Payer: Self-pay

## 2017-09-19 ENCOUNTER — Ambulatory Visit (HOSPITAL_COMMUNITY): Payer: BLUE CROSS/BLUE SHIELD | Admitting: Vascular Surgery

## 2017-09-19 ENCOUNTER — Ambulatory Visit (HOSPITAL_COMMUNITY): Payer: BLUE CROSS/BLUE SHIELD | Admitting: Certified Registered Nurse Anesthetist

## 2017-09-19 ENCOUNTER — Ambulatory Visit (HOSPITAL_COMMUNITY): Payer: BLUE CROSS/BLUE SHIELD

## 2017-09-19 ENCOUNTER — Ambulatory Visit (HOSPITAL_COMMUNITY)
Admission: RE | Admit: 2017-09-19 | Discharge: 2017-09-19 | Disposition: A | Discharge status: Home / Self Care | Payer: BLUE CROSS/BLUE SHIELD | Source: Ambulatory Visit | Attending: Thoracic Surgery (Cardiothoracic Vascular Surgery) | Admitting: Thoracic Surgery (Cardiothoracic Vascular Surgery)

## 2017-09-19 DIAGNOSIS — I480 Paroxysmal atrial fibrillation: Secondary | ICD-10-CM | POA: Diagnosis not present

## 2017-09-19 DIAGNOSIS — Z6830 Body mass index (BMI) 30.0-30.9, adult: Secondary | ICD-10-CM | POA: Insufficient documentation

## 2017-09-19 DIAGNOSIS — Z880 Allergy status to penicillin: Secondary | ICD-10-CM | POA: Diagnosis not present

## 2017-09-19 DIAGNOSIS — F419 Anxiety disorder, unspecified: Secondary | ICD-10-CM | POA: Diagnosis not present

## 2017-09-19 DIAGNOSIS — J8410 Pulmonary fibrosis, unspecified: Secondary | ICD-10-CM | POA: Diagnosis not present

## 2017-09-19 DIAGNOSIS — J984 Other disorders of lung: Secondary | ICD-10-CM | POA: Insufficient documentation

## 2017-09-19 DIAGNOSIS — G4730 Sleep apnea, unspecified: Secondary | ICD-10-CM | POA: Diagnosis not present

## 2017-09-19 DIAGNOSIS — Z7901 Long term (current) use of anticoagulants: Secondary | ICD-10-CM | POA: Diagnosis not present

## 2017-09-19 DIAGNOSIS — I2510 Atherosclerotic heart disease of native coronary artery without angina pectoris: Secondary | ICD-10-CM | POA: Insufficient documentation

## 2017-09-19 DIAGNOSIS — R918 Other nonspecific abnormal finding of lung field: Secondary | ICD-10-CM | POA: Diagnosis not present

## 2017-09-19 DIAGNOSIS — J9809 Other diseases of bronchus, not elsewhere classified: Secondary | ICD-10-CM | POA: Diagnosis not present

## 2017-09-19 DIAGNOSIS — K219 Gastro-esophageal reflux disease without esophagitis: Secondary | ICD-10-CM | POA: Insufficient documentation

## 2017-09-19 DIAGNOSIS — D491 Neoplasm of unspecified behavior of respiratory system: Secondary | ICD-10-CM | POA: Insufficient documentation

## 2017-09-19 DIAGNOSIS — I10 Essential (primary) hypertension: Secondary | ICD-10-CM | POA: Diagnosis not present

## 2017-09-19 DIAGNOSIS — Z79899 Other long term (current) drug therapy: Secondary | ICD-10-CM | POA: Insufficient documentation

## 2017-09-19 DIAGNOSIS — J45909 Unspecified asthma, uncomplicated: Secondary | ICD-10-CM | POA: Insufficient documentation

## 2017-09-19 DIAGNOSIS — J4530 Mild persistent asthma, uncomplicated: Secondary | ICD-10-CM | POA: Diagnosis not present

## 2017-09-19 DIAGNOSIS — E669 Obesity, unspecified: Secondary | ICD-10-CM | POA: Insufficient documentation

## 2017-09-19 HISTORY — PX: LASER BRONCHOSCOPY: SHX6534

## 2017-09-19 HISTORY — PX: LUNG SURGERY: SHX703

## 2017-09-19 HISTORY — PX: VIDEO BRONCHOSCOPY: SHX5072

## 2017-09-19 SURGERY — BRONCHOSCOPY, VIDEO-ASSISTED
Anesthesia: General

## 2017-09-19 MED ORDER — LIDOCAINE HCL (CARDIAC) 20 MG/ML IV SOLN
INTRAVENOUS | Status: DC | PRN
  Administered 2017-09-19: 50 mg via INTRAVENOUS

## 2017-09-19 MED ORDER — MIDAZOLAM HCL 2 MG/2ML IJ SOLN
INTRAMUSCULAR | Status: AC
  Filled 2017-09-19: qty 2

## 2017-09-19 MED ORDER — LACTATED RINGERS IV SOLN
INTRAVENOUS | Status: DC
  Administered 2017-09-19 (×2): via INTRAVENOUS

## 2017-09-19 MED ORDER — PROPOFOL 10 MG/ML IV BOLUS
INTRAVENOUS | Status: AC
  Filled 2017-09-19: qty 40

## 2017-09-19 MED ORDER — ACETAMINOPHEN 325 MG PO TABS: 650.0000 mg | ORAL_TABLET | ORAL | Status: DC | PRN

## 2017-09-19 MED ORDER — DEXAMETHASONE SODIUM PHOSPHATE 10 MG/ML IJ SOLN
INTRAMUSCULAR | Status: DC | PRN
  Administered 2017-09-19: 10 mg via INTRAVENOUS

## 2017-09-19 MED ORDER — PROPOFOL 10 MG/ML IV BOLUS
INTRAVENOUS | Status: DC | PRN
  Administered 2017-09-19: 150 mg via INTRAVENOUS
  Administered 2017-09-19 (×3): 50 mg via INTRAVENOUS

## 2017-09-19 MED ORDER — ACETAMINOPHEN 650 MG RE SUPP: 650.0000 mg | RECTAL | Status: DC | PRN

## 2017-09-19 MED ORDER — SODIUM CHLORIDE 0.9 % IV SOLN: 250.0000 mL | INTRAVENOUS | Status: DC | PRN

## 2017-09-19 MED ORDER — SODIUM CHLORIDE 0.9% FLUSH: 3.0000 mL | Freq: Two times a day (BID) | INTRAVENOUS | Status: DC

## 2017-09-19 MED ORDER — FENTANYL CITRATE (PF) 100 MCG/2ML IJ SOLN
INTRAMUSCULAR | Status: DC | PRN
  Administered 2017-09-19 (×3): 50 ug via INTRAVENOUS

## 2017-09-19 MED ORDER — MIDAZOLAM HCL 5 MG/5ML IJ SOLN
INTRAMUSCULAR | Status: DC | PRN
  Administered 2017-09-19: 2 mg via INTRAVENOUS

## 2017-09-19 MED ORDER — HYDROMORPHONE HCL 1 MG/ML IJ SOLN: 0.2500 mg | INTRAMUSCULAR | Status: DC | PRN

## 2017-09-19 MED ORDER — SODIUM CHLORIDE 0.9% FLUSH: 3.0000 mL | INTRAVENOUS | Status: DC | PRN

## 2017-09-19 MED ORDER — 0.9 % SODIUM CHLORIDE (POUR BTL) OPTIME
TOPICAL | Status: DC | PRN
  Administered 2017-09-19: 1000 mL

## 2017-09-19 MED ORDER — ONDANSETRON HCL 4 MG/2ML IJ SOLN
INTRAMUSCULAR | Status: DC | PRN
  Administered 2017-09-19: 4 mg via INTRAVENOUS

## 2017-09-19 MED ORDER — SODIUM CHLORIDE 0.9 % IR SOLN
Status: DC | PRN
  Administered 2017-09-19: 09:00:00

## 2017-09-19 MED ORDER — SUGAMMADEX SODIUM 200 MG/2ML IV SOLN
INTRAVENOUS | Status: DC | PRN
  Administered 2017-09-19: 200 mg via INTRAVENOUS

## 2017-09-19 MED ORDER — FENTANYL CITRATE (PF) 250 MCG/5ML IJ SOLN
INTRAMUSCULAR | Status: AC
  Filled 2017-09-19: qty 5

## 2017-09-19 MED ORDER — ROCURONIUM BROMIDE 100 MG/10ML IV SOLN
INTRAVENOUS | Status: DC | PRN
  Administered 2017-09-19: 50 mg via INTRAVENOUS

## 2017-09-19 MED ORDER — EPINEPHRINE PF 1 MG/ML IJ SOLN
INTRAMUSCULAR | Status: AC
  Filled 2017-09-19: qty 1

## 2017-09-19 MED ORDER — OXYCODONE HCL 5 MG PO TABS: 5.0000 mg | ORAL_TABLET | ORAL | Status: DC | PRN

## 2017-09-19 SURGICAL SUPPLY — 43 items
ADAPTER CATH SYR TO TUBING 38M (ADAPTER) ×2 IMPLANT
ADPR CATH LL SYR 3/32 TPR (ADAPTER) ×1
BNDG GAUZE ELAST 4 BULKY (GAUZE/BANDAGES/DRESSINGS) IMPLANT
BRUSH CYTOL CELLEBRITY 1.5X140 (MISCELLANEOUS) IMPLANT
CANISTER SUCT 3000ML PPV (MISCELLANEOUS) ×2 IMPLANT
CONT SPEC 4OZ CLIKSEAL STRL BL (MISCELLANEOUS) ×6 IMPLANT
COVER BACK TABLE 60X90IN (DRAPES) ×2 IMPLANT
DRAPE INCISE IOBAN 66X45 STRL (DRAPES) IMPLANT
FILTER STRAW FLUID ASPIR (MISCELLANEOUS) ×1 IMPLANT
FORCEPS BIOP RJ4 1.8 (CUTTING FORCEPS) ×1 IMPLANT
FORCEPS RADIAL JAW LRG 4 PULM (INSTRUMENTS) IMPLANT
GAS CARTRIDGE  LASER (MISCELLANEOUS) ×2 IMPLANT
GAUZE SPONGE 4X4 12PLY STRL (GAUZE/BANDAGES/DRESSINGS) ×2 IMPLANT
GAUZE VASELINE FOILPK 1/2 X 72 (GAUZE/BANDAGES/DRESSINGS) IMPLANT
GLOVE SURG SIGNA 7.5 PF LTX (GLOVE) ×2 IMPLANT
GOWN STRL REUS W/ TWL LRG LVL3 (GOWN DISPOSABLE) ×1 IMPLANT
GOWN STRL REUS W/ TWL XL LVL3 (GOWN DISPOSABLE) ×1 IMPLANT
GOWN STRL REUS W/TWL LRG LVL3 (GOWN DISPOSABLE) ×2
GOWN STRL REUS W/TWL XL LVL3 (GOWN DISPOSABLE) ×2
GUARD TEETH (MISCELLANEOUS) IMPLANT
KIT BASIN OR (CUSTOM PROCEDURE TRAY) ×2 IMPLANT
KIT CLEAN ENDO COMPLIANCE (KITS) ×2 IMPLANT
KIT ROOM TURNOVER OR (KITS) ×2 IMPLANT
LASER FIBER FLEXIBLE (MISCELLANEOUS) ×2 IMPLANT
LASER FIBER SLIMLINE 550 DISP (MISCELLANEOUS) ×1 IMPLANT
MARKER SKIN DUAL TIP RULER LAB (MISCELLANEOUS) ×2 IMPLANT
NDL BLUNT 16X1.5 OR ONLY (NEEDLE) ×1 IMPLANT
NEEDLE BLUNT 16X1.5 OR ONLY (NEEDLE) ×2 IMPLANT
NS IRRIG 1000ML POUR BTL (IV SOLUTION) ×4 IMPLANT
OIL SILICONE PENTAX (PARTS (SERVICE/REPAIRS)) ×2 IMPLANT
PAD ARMBOARD 7.5X6 YLW CONV (MISCELLANEOUS) ×4 IMPLANT
RADIAL JAW LRG 4 PULMONARY (INSTRUMENTS) ×1
SNARE SHORT THROW 13M SML OVAL (MISCELLANEOUS) IMPLANT
SOLUTION ANTI FOG 6CC (MISCELLANEOUS) ×2 IMPLANT
SYR 20ML ECCENTRIC (SYRINGE) ×6 IMPLANT
SYR 5ML LL (SYRINGE) ×2 IMPLANT
SYR 5ML LUER SLIP (SYRINGE) ×2 IMPLANT
SYR BULB IRRIGATION 50ML (SYRINGE) ×2 IMPLANT
SYR TOOMEY 50ML (SYRINGE) IMPLANT
TOWEL OR 17X26 10 PK STRL BLUE (TOWEL DISPOSABLE) ×2 IMPLANT
TRAP SPECIMEN MUCOUS 40CC (MISCELLANEOUS) ×2 IMPLANT
TUBE CONNECTING 20X1/4 (TUBING) ×2 IMPLANT
WATER STERILE IRR 1000ML POUR (IV SOLUTION) ×1 IMPLANT

## 2017-09-19 NOTE — Anesthesia Postprocedure Evaluation (Signed)
Anesthesia Post Note  Patient: Amanda Estrada  Procedure(s) Performed: VIDEO BRONCHOSCOPY, with endobronchial tumor resection (N/A ) LASER BRONCHOSCOPY (N/A )     Patient location during evaluation: PACU Anesthesia Type: General Level of consciousness: awake Pain management: pain level controlled Vital Signs Assessment: post-procedure vital signs reviewed and stable Respiratory status: spontaneous breathing Cardiovascular status: stable Anesthetic complications: no    Last Vitals:  Vitals:   09/19/17 1115 09/19/17 1120  BP: (!) 168/68 (!) 163/68  Pulse: 94 91  Resp: 16 20  Temp:    SpO2: 96% 95%    Last Pain:  Vitals:   09/19/17 1130  TempSrc:   PainSc: 0-No pain                 Keeyon Privitera

## 2017-09-19 NOTE — Anesthesia Procedure Notes (Signed)
Procedure Name: Intubation Date/Time: 09/19/2017 8:57 AM Performed by: Valda Favia, CRNA Pre-anesthesia Checklist: Patient identified, Emergency Drugs available, Suction available, Patient being monitored and Timeout performed Patient Re-evaluated:Patient Re-evaluated prior to induction Oxygen Delivery Method: Circle system utilized Preoxygenation: Pre-oxygenation with 100% oxygen Induction Type: IV induction Ventilation: Mask ventilation without difficulty Laryngoscope Size: Mac, 4 and Glidescope Grade View: Grade III Tube type: Oral Tube size: 8.5 mm Number of attempts: 3 Airway Equipment and Method: Stylet and Video-laryngoscopy Placement Confirmation: ETT inserted through vocal cords under direct vision,  positive ETCO2 and breath sounds checked- equal and bilateral Secured at: 22 cm Tube secured with: Tape Dental Injury: Bloody posterior oropharynx  Comments: DLx 1 MAC 4 Grade III view, resume mask ventilation. DLx 2 Glidescope 4 Grade II view attempt to pass 8.0 Laser ETT unable to pass through cords. Oral 8.5 ETT placed, +EtCO2 and bilateral breath sounds.  Bougie stylet placed through exsisting ETT in attempt to exchange for laser ETTutilizing glidescope .  8.0 and 7.0 Laser ETT attempted neither would pass through vocal cords. Surgeon notified, proceed with regular ETT with saline in the cuff.

## 2017-09-19 NOTE — Brief Op Note (Signed)
09/19/2017  10:36 AM  PATIENT:  Amanda Estrada  56 y.o. female  PRE-OPERATIVE DIAGNOSIS:  left lower lobe endobronchial tumor   POST-OPERATIVE DIAGNOSIS:  left lower lobe endobronchial tumor   PROCEDURE:  Procedure(s): VIDEO BRONCHOSCOPY, with endobronchial tumor resection (N/A) LASER BRONCHOSCOPY (N/A)  SURGEON:  Surgeon(s) and Role:    * Melrose Nakayama, MD - Primary  PHYSICIAN ASSISTANT:   ASSISTANTS: none   ANESTHESIA:   general  EBL:  minimal  BLOOD ADMINISTERED:none  DRAINS: none   LOCAL MEDICATIONS USED:  NONE  SPECIMEN:  Source of Specimen:  LLL endobronchial mass  DISPOSITION OF SPECIMEN:  PATHOLOGY  COUNTS:  NO endoscopic  TOURNIQUET:  * No tourniquets in log *  DICTATION: .Other Dictation: Dictation Number -  PLAN OF CARE: Discharge to home after PACU  PATIENT DISPOSITION:  PACU - hemodynamically stable.   Delay start of Pharmacological VTE agent (>24hrs) due to surgical blood loss or risk of bleeding: not applicable

## 2017-09-19 NOTE — Op Note (Signed)
NAMEEMALYN, SCHOU             ACCOUNT NO.:  [id number]  MEDICAL RECORD NO.:  [id number]  LOCATION:                                 FACILITY:  PHYSICIAN:  Revonda Standard. Roxan Hockey, M.D.DATE OF BIRTH:  Sep 09, 1961  DATE OF PROCEDURE:  09/19/2017 DATE OF DISCHARGE:                              OPERATIVE REPORT   PREOPERATIVE DIAGNOSIS:  Endobronchial tumor.  POSTOPERATIVE DIAGNOSIS:  Endobronchial tumor.  PROCEDURE:  Video bronchoscopy with endobronchial tumor resection and laser ablation.  SURGEON:  Revonda Standard. Roxan Hockey, MD.  ASSISTANT:  None.  ANESTHESIA:  General.  FINDINGS:  Fleshy tumor at origin of the superior segmental bronchus of left lower lobe.  Frozen section showed benign bronchial mucosa.  No definite tumor seen.  CLINICAL NOTE:  Mrs. Pancake is a 56 year old woman, who has been having respiratory problems dating back about a year.  She has had intermittent hemoptysis, coughing, and wheezing despite treatment with bronchodilators.  About a month and a half ago, she developed more severe coughing and increasing hemoptysis.  Her anticoagulation was stopped and her hemoptysis improved.  A CT of the chest showed an endobronchial mass in the superior segmental bronchus of the left lower lobe.  Dr. Melvyn Novas did bronchoscopy, which revealed a polypoid smooth surface tumor obstructing the superior segment of the left lower lobe. Biopsies were nondiagnostic.  She was offered the option of surgical resection versus an attempt at endobronchial resection.  She understood there was no guarantee the tumor could be completely removed endobronchially, but wished to proceed.  She accepted the risks of the procedure.  OPERATIVE NOTE:  Mrs. Trussell was brought to the operating room on September 19, 2017.  She had induction of general anesthesia and was intubated.  Flexible fiberoptic bronchoscopy was performed via the endotracheal tube.  There was some blood in the airways,  which cleared with saline.  The trachea and right bronchial tree were within normal limits.  The scope was advanced down the left mainstem, which was normal.  The left upper lobe and basilar segmental portions of the lower lobe were normal as well.  There was a tumor originating from the superior segmental bronchus of the left lower lobe extending out past the origin.  Biopsies were taken.  There was minimal bleeding.  The tumor was fleshy in consistency.  The first 2 biopsies were sent for frozen section.  While awaiting those results, additional biopsies were taken.  The initial frozen section returned showing benign bronchial mucosa.  So, the additional specimens were sent for repeat frozen section, but that was also returned nondiagnostic.  Decision was made to complete the debridement of the tumor endobronchially.  All was removed except for a very thin layer along the bronchial wall.  There was minimal bleeding with the biopsies and dilute epinephrine was applied topically, which controlled the bleeding.  Decision was made to proceed with laser ablation of the site of origin.  This was done using an Nd:YAG laser and a noncontact probe. Multiple firings were performed at the base of the tumor.  The area was flushed with saline.  There was no ongoing bleeding.  The bronchoscope was removed.  The patient was extubated in  the operating room and taken to the postanesthetic care unit in good condition.     Revonda Standard Roxan Hockey, M.D.     SCH/MEDQ  D:  09/19/2017  T:  09/19/2017  Job:  [id number]

## 2017-09-19 NOTE — Discharge Instructions (Addendum)
Do not drive or engage in heavy physical activity for 24 hours  You may resume normal activities tomorrow  You may cough up small amounts of blood over the next few days. Call if you cough up more than a tablespoon of blood  Call [phone] if you develop chest pain, shortness of breath, fever > 101 F or cough up excessive blood  My office will call with a follow up appointment for next week.  You may use an over the counter cough medication or throat lozenges if needed. You may use acetaminophen (Tylenol) if needed for pain

## 2017-09-19 NOTE — Anesthesia Preprocedure Evaluation (Signed)
Anesthesia Evaluation  Patient identified by MRN, date of birth, ID band Patient awake    Reviewed: Allergy & Precautions, NPO status , Patient's Chart, lab work & pertinent test results  Airway Mallampati: II  TM Distance: >3 FB     Dental   Pulmonary asthma , sleep apnea ,    breath sounds clear to auscultation       Cardiovascular hypertension, + CAD   Rhythm:Regular Rate:Normal     Neuro/Psych    GI/Hepatic Neg liver ROS, GERD  ,  Endo/Other  negative endocrine ROS  Renal/GU negative Renal ROS     Musculoskeletal   Abdominal   Peds  Hematology   Anesthesia Other Findings   Reproductive/Obstetrics                             Anesthesia Physical Anesthesia Plan  ASA: III  Anesthesia Plan: General   Post-op Pain Management:    Induction: Intravenous  PONV Risk Score and Plan: 3 and Treatment may vary due to age or medical condition, Ondansetron, Dexamethasone and Propofol infusion  Airway Management Planned: Oral ETT  Additional Equipment:   Intra-op Plan:   Post-operative Plan: Possible Post-op intubation/ventilation  Informed Consent: I have reviewed the patients History and Physical, chart, labs and discussed the procedure including the risks, benefits and alternatives for the proposed anesthesia with the patient or authorized representative who has indicated his/her understanding and acceptance.   Dental advisory given  Plan Discussed with: CRNA and Anesthesiologist  Anesthesia Plan Comments:         Anesthesia Quick Evaluation

## 2017-09-19 NOTE — Interval H&P Note (Signed)
History and Physical Interval Note:  09/19/2017 8:23 AM  Amanda Estrada  has presented today for surgery, with the diagnosis of left lower lobe endobronchial tumor   The various methods of treatment have been discussed with the patient and family. After consideration of risks, benefits and other options for treatment, the patient has consented to  Procedure(s) with comments: VIDEO BRONCHOSCOPY, Possible endobronchial tumor resection, Possible laser Bronchoscopy (N/A) - Possible endobronchial tumor resection, Possible laser Bronchoscopy LASER BRONCHOSCOPY (N/A) - Possible endobronchial tumor resection, Possible laser Bronchoscopy as a surgical intervention .  The patient's history has been reviewed, patient examined, no change in status, stable for surgery.  I have reviewed the patient's chart and labs.  Questions were answered to the patient's satisfaction.     Melrose Nakayama

## 2017-09-19 NOTE — Transfer of Care (Signed)
Immediate Anesthesia Transfer of Care Note  Patient: Amanda Estrada  Procedure(s) Performed: VIDEO BRONCHOSCOPY, with endobronchial tumor resection (N/A ) LASER BRONCHOSCOPY (N/A )  Patient Location: PACU  Anesthesia Type:General  Level of Consciousness: awake, alert  and oriented  Airway & Oxygen Therapy: Patient Spontanous Breathing and Patient connected to nasal cannula oxygen  Post-op Assessment: Report given to RN and Post -op Vital signs reviewed and stable  Post vital signs: Reviewed and stable  Last Vitals:  Vitals:   09/19/17 0753 09/19/17 1034  BP: (!) 164/83 (!) 175/72  Pulse: 74 (!) 106  Resp: 18 (!) 25  Temp: 36.8 C (!) (P) 36.1 C  SpO2: 99% (!) 88%    Last Pain:  Vitals:   09/19/17 0753  TempSrc: Oral         Complications: No apparent anesthesia complications

## 2017-09-19 NOTE — Anesthesia Postprocedure Evaluation (Signed)
Anesthesia Post Note  Patient: Amanda Estrada  Procedure(s) Performed: VIDEO BRONCHOSCOPY, with endobronchial tumor resection (N/A ) LASER BRONCHOSCOPY (N/A )     Patient location during evaluation: PACU Anesthesia Type: General Level of consciousness: awake Pain management: pain level controlled Vital Signs Assessment: post-procedure vital signs reviewed and stable Respiratory status: spontaneous breathing Cardiovascular status: stable Anesthetic complications: no    Last Vitals:  Vitals:   09/19/17 1115 09/19/17 1120  BP: (!) 168/68 (!) 163/68  Pulse: 94 91  Resp: 16 20  Temp:    SpO2: 96% 95%    Last Pain:  Vitals:   09/19/17 1130  TempSrc:   PainSc: 0-No pain                 Tallan Sandoz

## 2017-09-20 ENCOUNTER — Encounter (HOSPITAL_COMMUNITY): Payer: Self-pay | Admitting: Thoracic Surgery (Cardiothoracic Vascular Surgery)

## 2017-09-20 ENCOUNTER — Encounter: Payer: Self-pay | Admitting: Family Medicine

## 2017-09-27 ENCOUNTER — Encounter: Payer: Self-pay | Admitting: Thoracic Surgery (Cardiothoracic Vascular Surgery)

## 2017-09-27 ENCOUNTER — Other Ambulatory Visit: Payer: Self-pay | Admitting: *Deleted

## 2017-09-27 ENCOUNTER — Other Ambulatory Visit: Payer: Self-pay

## 2017-09-27 ENCOUNTER — Ambulatory Visit: Payer: BLUE CROSS/BLUE SHIELD | Admitting: Thoracic Surgery (Cardiothoracic Vascular Surgery)

## 2017-09-27 VITALS — BP 155/76 | HR 84 | Resp 16 | Ht 66.0 in | Wt 180.0 lb

## 2017-09-27 DIAGNOSIS — R05 Cough: Secondary | ICD-10-CM

## 2017-09-27 DIAGNOSIS — Z09 Encounter for follow-up examination after completed treatment for conditions other than malignant neoplasm: Secondary | ICD-10-CM

## 2017-09-27 DIAGNOSIS — R918 Other nonspecific abnormal finding of lung field: Secondary | ICD-10-CM | POA: Diagnosis not present

## 2017-09-27 DIAGNOSIS — R053 Chronic cough: Secondary | ICD-10-CM

## 2017-09-27 MED ORDER — BENZONATATE 200 MG PO CAPS: 200.0000 mg | ORAL_CAPSULE | Freq: Three times a day (TID) | ORAL | 2 refills | Status: DC | PRN

## 2017-09-27 NOTE — Progress Notes (Signed)
[address]       [place] [address]             [phone]       HPI: Mrs Liggins returns for a scheduled follow-up visit  She is a 56 year old woman who has a one-year history of intermittent hemoptysis, coughing, and wheezing.  Dr. Diona Browner did a CT of the chest which showed an endobronchial mass in the superior segmental bronchus of the left lower lobe.  Dr. worked up bronchoscopy.  Biopsies were nondiagnostic.  I did an endobronchial resection and laser ablation of the tumor on 09/19/2017.  She went home on the day of the procedure.  She has continued to have some coughing.  She has not had any hemoptysis.  Past Medical History:  Diagnosis Date  . Allergy   . Anxiety   . Asthma   . History of echocardiogram    a. 11/2015: EF 55-60%, no RWMA, nl LV diastolic fxn, PASP nl  . Hypertension   . Obesity   . PAF (paroxysmal atrial fibrillation) ([place])    a. CHADS2VASc = 2 (HTN, sex category)  . Sinusitis 2011   Recurrent per Dr. Tami Ribas with [facility]    Current Outpatient Medications  Medication Sig Dispense Refill  . albuterol (VENTOLIN HFA) 108 (90 Base) MCG/ACT inhaler Inhale 2 puffs into lungs every 6 hours as needed for wheezing. 18 g 5  . cetirizine (ZYRTEC) 10 MG tablet Take 10 mg by mouth daily.    . cloNIDine (CATAPRES) 0.1 MG tablet Take 0.1 mg by mouth at bedtime.    Marland Kitchen diltiazem (CARDIZEM CD) 240 MG 24 hr capsule TAKE 1 CAPSULE (240 MG TOTAL) BY MOUTH ONCE DAILY.  11  . ELIDEL 1 % cream Apply 1 application daily as needed topically (for allergies under eyes).   2  . fluticasone (FLONASE) 50 MCG/ACT nasal spray USE 1 SPRAY IN THE NOSE DAILY AS DIRECTED (Patient taking differently: Use 1 spray in each nostril daily as needed for allergies) 16 g 2  . losartan (COZAAR) 100 MG tablet TAKE 1 TABLET (100 MG TOTAL) BY MOUTH DAILY. 90 tablet 3  . metoprolol succinate (TOPROL-XL) 25 MG 24 hr tablet Take 1 tablet (25 mg total) by mouth daily. (Patient  taking differently: Take 25 mg by mouth daily as needed (for AFIB). ) 90 tablet 3  . montelukast (SINGULAIR) 10 MG tablet TAKE 1 TABLET (10 MG TOTAL) BY MOUTH AT BEDTIME. 90 tablet 3  . omeprazole (PRILOSEC) 40 MG capsule Take 40 mg by mouth daily.    . rosuvastatin (CRESTOR) 20 MG tablet TAKE 1 TABLET (20 MG TOTAL) BY MOUTH DAILY. 90 tablet 3  . spironolactone (ALDACTONE) 25 MG tablet TAKE 25 MG BY MOUTH DAILY  11   No current facility-administered medications for this visit.     Physical Exam BP (!) 155/76 (BP Location: Right Arm, Patient Position: Sitting, Cuff Size: Large)   Pulse 84   Resp 16   Ht 5\' 6"  (1.676 m)   Wt 180 lb (81.6 kg)   SpO2 99% Comment: ON RA  BMI 29.43 kg/m  A 56 year old woman in no acute distress Alert and oriented x3 with no focal deficits Lungs clear with good breath sounds bilaterally  Diagnostic Tests: FINAL DIAGNOSIS Diagnosis 1. Endobronchial biopsy, Left lower lobe - MUCINOUS EPITHELIAL PROLIFERATION, SEE COMMENT. 2. Endobronchial biopsy, Left lower lobe - MUCINOUS EPITHELIAL PROLIFERATION, SEE COMMENT. 3. Endobronchial biopsy, Left lower lobe -  MUCINOUS EPITHELIAL PROLIFERATION, SEE COMMENT. Microscopic Comment 1. -3. The biopsies consistent of fragments of bland appearing mucinous epithelium. Some fragments have a papillary appearance with sclerotic stroma. Significant atypia or mitotic figures are not seen. Overall, the proliferation is favored to be benign with a bronchial papillary adenoma being in the differential. Dr. Roxan Hockey was paged on 09/20/2017. Vicente Males MD Pathologist, Electronic Signature (Case signed 09/20/2017) Intraoperative Diagnosis 1. RAPID INTRAOPERATIVE CONSULT: LUNG, LEFT LOWER LOBE ENDOBRONCHIAL MASS, FROZEN  Impression: Mrs. Depierro is a 56 year old woman who presented with cough, wheezing and hemoptysis.  She was found to have a left lower lobe endobronchial lesion.  I did an endobronchial resection and  laser ablation last week.  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  There was no evidence of malignancy.  She tolerated the procedure well.  She does have some persistent coughing which I would expect after the procedure.  That should resolve with time.  This was a benign lesion but there is a possibility of recurrence.  She will need continued follow-up.  I will plan to do a CT and repeat surveillance bronchoscopy in a year.  Plan: Return in 1 year with CT chest  Melrose Nakayama, MD [facility] [phone]

## 2017-10-13 ENCOUNTER — Telehealth: Payer: Self-pay | Admitting: Family Medicine

## 2017-10-13 DIAGNOSIS — I10 Essential (primary) hypertension: Secondary | ICD-10-CM

## 2017-10-13 DIAGNOSIS — E7800 Pure hypercholesterolemia, unspecified: Secondary | ICD-10-CM

## 2017-10-13 NOTE — Telephone Encounter (Signed)
-----   Message from Ellamae Sia sent at 10/05/2017  4:11 PM EST ----- Regarding: Lab orders for Friday, 12.7.18 Patient is scheduled for CPX labs, please order future labs, Thanks , Karna Christmas

## 2017-10-13 NOTE — Addendum Note (Signed)
Addended by: Ellamae Sia on: 10/13/2017 11:00 AM   Modules accepted: Orders

## 2017-10-14 ENCOUNTER — Other Ambulatory Visit (INDEPENDENT_AMBULATORY_CARE_PROVIDER_SITE_OTHER): Payer: BLUE CROSS/BLUE SHIELD

## 2017-10-14 DIAGNOSIS — I10 Essential (primary) hypertension: Secondary | ICD-10-CM

## 2017-10-14 DIAGNOSIS — E7800 Pure hypercholesterolemia, unspecified: Secondary | ICD-10-CM | POA: Diagnosis not present

## 2017-10-14 LAB — CBC WITH DIFFERENTIAL/PLATELET
BASOS PCT: 1 % (ref 0.0–3.0)
Basophils Absolute: 0.1 10*3/uL (ref 0.0–0.1)
EOS PCT: 6.9 % — AB (ref 0.0–5.0)
Eosinophils Absolute: 0.4 10*3/uL (ref 0.0–0.7)
HEMATOCRIT: 40.2 % (ref 36.0–46.0)
HEMOGLOBIN: 13.4 g/dL (ref 12.0–15.0)
LYMPHS PCT: 21.5 % (ref 12.0–46.0)
Lymphs Abs: 1.2 10*3/uL (ref 0.7–4.0)
MCHC: 33.3 g/dL (ref 30.0–36.0)
MCV: 95 fl (ref 78.0–100.0)
Monocytes Absolute: 0.4 10*3/uL (ref 0.1–1.0)
Monocytes Relative: 7.3 % (ref 3.0–12.0)
Neutro Abs: 3.5 10*3/uL (ref 1.4–7.7)
Neutrophils Relative %: 63.3 % (ref 43.0–77.0)
Platelets: 287 10*3/uL (ref 150.0–400.0)
RBC: 4.23 Mil/uL (ref 3.87–5.11)
RDW: 13.6 % (ref 11.5–15.5)
WBC: 5.6 10*3/uL (ref 4.0–10.5)

## 2017-10-14 LAB — COMPREHENSIVE METABOLIC PANEL
ALBUMIN: 4.6 g/dL (ref 3.5–5.2)
ALK PHOS: 73 U/L (ref 39–117)
ALT: 16 U/L (ref 0–35)
AST: 15 U/L (ref 0–37)
BUN: 12 mg/dL (ref 6–23)
CALCIUM: 9.6 mg/dL (ref 8.4–10.5)
CHLORIDE: 99 meq/L (ref 96–112)
CO2: 25 mEq/L (ref 19–32)
CREATININE: 0.72 mg/dL (ref 0.40–1.20)
GFR: 88.72 mL/min (ref >60.00)
Glucose, Bld: 94 mg/dL (ref 70–99)
POTASSIUM: 4.5 meq/L (ref 3.5–5.1)
Sodium: 133 mEq/L — ABNORMAL LOW (ref 135–145)
TOTAL PROTEIN: 7.2 g/dL (ref 6.0–8.3)
Total Bilirubin: 0.4 mg/dL (ref 0.2–1.2)

## 2017-10-14 LAB — LIPID PANEL
Cholesterol: 155 mg/dL (ref 0–200)
HDL: 95.4 mg/dL (ref >39.00)
LDL CALC: 48 mg/dL (ref 0–99)
NonHDL: 60.07
TRIGLYCERIDES: 62 mg/dL (ref 0.0–149.0)
Total CHOL/HDL Ratio: 2
VLDL: 12.4 mg/dL (ref 0.0–40.0)

## 2017-10-15 ENCOUNTER — Other Ambulatory Visit: Payer: Self-pay | Admitting: Family Medicine

## 2017-10-18 ENCOUNTER — Encounter: Payer: Self-pay | Admitting: Family Medicine

## 2017-11-25 ENCOUNTER — Other Ambulatory Visit: Payer: Self-pay | Admitting: Family Medicine

## 2017-12-01 ENCOUNTER — Ambulatory Visit (INDEPENDENT_AMBULATORY_CARE_PROVIDER_SITE_OTHER): Payer: BLUE CROSS/BLUE SHIELD | Admitting: Family Medicine

## 2017-12-01 ENCOUNTER — Other Ambulatory Visit: Payer: Self-pay

## 2017-12-01 ENCOUNTER — Encounter: Payer: Self-pay | Admitting: Family Medicine

## 2017-12-01 VITALS — BP 160/74 | HR 77 | Temp 98.4°F | Ht 64.5 in | Wt 184.0 lb

## 2017-12-01 DIAGNOSIS — I2510 Atherosclerotic heart disease of native coronary artery without angina pectoris: Secondary | ICD-10-CM | POA: Diagnosis not present

## 2017-12-01 DIAGNOSIS — Z0000 Encounter for general adult medical examination without abnormal findings: Secondary | ICD-10-CM | POA: Diagnosis not present

## 2017-12-01 DIAGNOSIS — I4891 Unspecified atrial fibrillation: Secondary | ICD-10-CM | POA: Diagnosis not present

## 2017-12-01 NOTE — Progress Notes (Signed)
Subjective:    Patient ID: Amanda Estrada, female    DOB: 08-18-1961, 57 y.o.   MRN: [id number]  HPI  The patient is here for annual wellness exam and preventative care.    In last year  09/2017 she has had a benign lung tumor removed from left lower lung causing chronic cough and hemoptysis. Pulm Dr. Melvyn Novas  CVTS Dr. Roxan Hockey.. Plans follow up CT and bronchoscopy in 1 year.  Cough resolved 6 weeks after surgery.  No hemoptysis any longer.   Albuterol not needed any longer.  Hypertension:  High today.. On losartan, cardizem, clonidine, metoprolol prn for afib.  (has not needed), spironolactone    BP Readings from Last 3 Encounters:  12/01/17 (!) 160/74  09/27/17 (!) 155/76  09/19/17 (!) 163/68  Using medication without problems or lightheadedness:  none Chest pain with exertion: none Edema: none Short of breath: none Average home BPs: Other issues:  Afib with RVR: Followed by cardiology. She would like to transfer from [facility] Dr. Rockey Situ to [place]  Elevated Cholesterol:  Good control on crestor Lab Results  Component Value Date   CHOL 155 10/14/2017   HDL 95.40 10/14/2017   LDLCALC 48 10/14/2017   LDLDIRECT 81.0 10/16/2010   TRIG 62.0 10/14/2017   CHOLHDL 2 10/14/2017  Using medications without problems: Muscle aches:  Diet compliance: low carb diet.. 18 lbs in last 6 months Exercise:  Walking some Other complaints:   Social History /Family History/Past Medical History reviewed in detail and updated in EMR if needed. Blood pressure (!) 160/74, pulse 77, temperature 98.4 F (36.9 C), temperature source Oral, height 5' 4.5" (1.638 m), weight 184 lb (83.5 kg).  Review of Systems  Constitutional: Negative for fatigue and fever.  HENT: Negative for congestion.   Eyes: Negative for pain.  Respiratory: Negative for cough and shortness of breath.   Cardiovascular: Negative for chest pain, palpitations and leg swelling.  Gastrointestinal: Negative for  abdominal pain.  Genitourinary: Negative for dysuria and vaginal bleeding.  Musculoskeletal: Negative for back pain.  Neurological: Negative for syncope, light-headedness and headaches.  Psychiatric/Behavioral: Negative for dysphoric mood.       Objective:   Physical Exam  Constitutional: Vital signs are normal. She appears well-developed and well-nourished. She is cooperative.  Non-toxic appearance. She does not appear ill. No distress.  HENT:  Head: Normocephalic.  Right Ear: Hearing, tympanic membrane, external ear and ear canal normal.  Left Ear: Hearing, tympanic membrane, external ear and ear canal normal.  Nose: Nose normal.  Eyes: Conjunctivae, EOM and lids are normal. Pupils are equal, round, and reactive to light. Lids are everted and swept, no foreign bodies found.  Neck: Trachea normal and normal range of motion. Neck supple. Carotid bruit is not present. No thyroid mass and no thyromegaly present.  Cardiovascular: Normal rate, regular rhythm, S1 normal, S2 normal, normal heart sounds and intact distal pulses. Exam reveals no gallop.  No murmur heard. Pulmonary/Chest: Effort normal and breath sounds normal. No respiratory distress. She has no wheezes. She has no rhonchi. She has no rales.  Abdominal: Soft. Normal appearance and bowel sounds are normal. She exhibits no distension, no fluid wave, no abdominal bruit and no mass. There is no hepatosplenomegaly. There is no tenderness. There is no rebound, no guarding and no CVA tenderness. No hernia.  Lymphadenopathy:    She has no cervical adenopathy.    She has no axillary adenopathy.  Neurological: She is alert. She has normal strength. No cranial  nerve deficit or sensory deficit.  Skin: Skin is warm, dry and intact. No rash noted.  Psychiatric: Her speech is normal and behavior is normal. Judgment normal. Her mood appears not anxious. Cognition and memory are normal. She does not exhibit a depressed mood.            Assessment & Plan:  The patient's preventative maintenance and recommended screening tests for an annual wellness exam were reviewed in full today. Brought up to date unless services declined.  Counselled on the importance of diet, exercise, and its role in overall health and mortality. The patient's FH and SH was reviewed, including their home life, tobacco status, and drug and alcohol status.   .Vaccines: Uptodate td. Due for flu Pap/DVE:  11/2015 nml pap, neg HPV, repeat  In 5 years. No family history of ovarian and uterine cancer Mammo:  Due. Bone Density:not indicated Colon:  Both her parent with colon cancer 2014, plan repeat in 5 years, Dr. Deatra Ina repeat 5 years per note  Smoking Status: nonsmoker ETOH/ drug use: none  Hep C:  done  HIV screen:   refused

## 2017-12-01 NOTE — Patient Instructions (Addendum)
Please stop at the front desk to set up referral.  In the next 2 week check blood pressure at home.. Goal < 140/90... Call or MyChart.  Call to set up mammogram on your own.

## 2017-12-05 ENCOUNTER — Encounter: Payer: Self-pay | Admitting: Family Medicine

## 2017-12-05 DIAGNOSIS — Z1231 Encounter for screening mammogram for malignant neoplasm of breast: Secondary | ICD-10-CM | POA: Diagnosis not present

## 2017-12-07 ENCOUNTER — Encounter: Payer: Self-pay | Admitting: Cardiology

## 2017-12-07 NOTE — Progress Notes (Signed)
Cardiology Office Note   Date:  12/08/2017   ID:  Amanda Estrada, DOB 1960/12/10, MRN [id number]  PCP:  Jinny Sanders, MD  Cardiologist:   No primary care provider on file. Referring:  Jinny Sanders, MD  Chief Complaint  Patient presents with  . Chest Pain      History of Present Illness: Amanda Estrada is a 57 y.o. female who presents for follow up of atrial fib.  She has previously seen Dr. Rockey Situ for evaluation of this.  She has had reservations about taking anticoagulation secondary to cough and blood tinged sputum.  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  This was apparently benign.  She is switching her office to [facility] for convenience.  She actually gets along relatively well.  She was noted on the CAT scan prior to her lung surgery to have extensive coronary calcium.  She is never had any prior cardiac workup other than echocardiograms for the A. fib.  She does not report any stress testing.  She does have a family history of early coronary disease.  She denies any chest pressure, neck or arm discomfort.  She does not report any palpitations, presyncope or syncope.  She does not have any extensive shortness of breath, PND or orthopnea.   Past Medical History:  Diagnosis Date  . Allergy   . Anxiety   . Asthma   . History of echocardiogram    a. 11/2015: EF 55-60%, no RWMA, nl LV diastolic fxn, PASP nl  . Hypertension   . Obesity   . PAF (paroxysmal atrial fibrillation) ([place])    a. CHADS2VASc = 2 (HTN, sex category)  . Sinusitis 2011   Recurrent per Dr. Tami Ribas with [facility]    Past Surgical History:  Procedure Laterality Date  . BUNIONECTOMY  11/2007   Right foot, 2 hammer-toes and tendon replacement (Dr. Paulla Dolly)  . CERVICAL DISCECTOMY  2001   C5/6  . CESAREAN SECTION  1985   Due to incr BP  . ENDOMETRIAL BIOPSY  03/27/2008   B9 endometrium w/breakdown changes (Dr, Kinscius)  . LASER  BRONCHOSCOPY N/A 09/19/2017   Procedure: LASER BRONCHOSCOPY;  Surgeon: Melrose Nakayama, MD;  Location: [facility];  Service: Thoracic;  Laterality: N/A;  . NSVD  1989   VBAC  . Renal artery [place]  02/2007   [place] arteries but no stenosis  . US TRANSVAGINAL PELVIC MODIFIED  04/09/2008   Normal  . VIDEO BRONCHOSCOPY Bilateral 08/10/2017   Procedure: VIDEO BRONCHOSCOPY WITHOUT FLUORO;  Surgeon: Tanda Rockers, MD;  Location: [facility];  Service: Cardiopulmonary;  Laterality: Bilateral;  . VIDEO BRONCHOSCOPY N/A 09/19/2017   Procedure: VIDEO BRONCHOSCOPY, with endobronchial tumor resection;  Surgeon: Melrose Nakayama, MD;  Location: [facility] OR;  Service: Thoracic;  Laterality: N/A;     Current Outpatient Medications  Medication Sig Dispense Refill  . albuterol (VENTOLIN HFA) 108 (90 Base) MCG/ACT inhaler Inhale 2 puffs into lungs every 6 hours as needed for wheezing. 18 g 5  . apixaban (ELIQUIS) 5 MG TABS tablet Take 1 tablet (5 mg total) by mouth 2 (two) times daily. 60 tablet 11  . cetirizine (ZYRTEC) 10 MG tablet Take 10 mg by mouth daily.    . cloNIDine (CATAPRES) 0.1 MG tablet Take 0.1 mg by mouth at bedtime.    Marland Kitchen diltiazem (CARDIZEM CD) 240 MG 24 hr capsule TAKE 1 CAPSULE (240 MG TOTAL)  BY MOUTH ONCE DAILY.  11  . ELIDEL 1 % cream Apply 1 application daily as needed topically (for allergies under eyes).   2  . fluticasone (FLONASE) 50 MCG/ACT nasal spray USE 1 SPRAY IN THE NOSE DAILY AS DIRECTED 16 g 11  . losartan (COZAAR) 100 MG tablet TAKE 1 TABLET (100 MG TOTAL) BY MOUTH DAILY. 90 tablet 1  . metoprolol succinate (TOPROL-XL) 25 MG 24 hr tablet Take 1 tablet (25 mg total) by mouth daily. (Patient taking differently: Take 25 mg by mouth daily as needed (for AFIB). ) 90 tablet 3  . montelukast (SINGULAIR) 10 MG tablet TAKE 1 TABLET (10 MG TOTAL) BY MOUTH AT BEDTIME. 90 tablet 3  . omeprazole (PRILOSEC) 40 MG capsule Take 40 mg by mouth daily.    . rosuvastatin (CRESTOR) 20 MG  tablet TAKE 1 TABLET (20 MG TOTAL) BY MOUTH DAILY. 90 tablet 3  . spironolactone (ALDACTONE) 50 MG tablet Take 1 tablet (50 mg total) by mouth daily. 90 tablet 3   No current facility-administered medications for this visit.     Allergies:   Amoxicillin-pot clavulanate    ROS:  Please see the history of present illness.   Otherwise, review of systems are positive for none.   All other systems are reviewed and negative.    PHYSICAL EXAM: VS:  BP (!) 172/80   Pulse 70   Ht 5\' 6"  (1.676 m)   Wt 186 lb 3.2 oz (84.5 kg)   BMI 30.05 kg/m  , BMI Body mass index is 30.05 kg/m. GENERAL:  Well appearing HEENT:  Pupils equal round and reactive, fundi not visualized, oral mucosa unremarkable NECK:  No jugular venous distention, waveform within normal limits, carotid upstroke brisk and symmetric, no bruits, no thyromegaly LYMPHATICS:  No cervical, inguinal adenopathy LUNGS:  Clear to auscultation bilaterally BACK:  No CVA tenderness CHEST:  Unremarkable HEART:  PMI not displaced or sustained,S1 and S2 within normal limits, no S3, no S4, no clicks, no rubs, no murmurs ABD:  Flat, positive bowel sounds normal in frequency in pitch, no bruits, no rebound, no guarding, no midline pulsatile mass, no hepatomegaly, no splenomegaly EXT:  2 plus pulses throughout, no edema, no cyanosis no clubbing SKIN:  No rashes no nodules NEURO:  Cranial nerves II through XII grossly intact, motor grossly intact throughout PSYCH:  Cognitively intact, oriented to person place and time    EKG:  EKG is ordered today. The ekg ordered today demonstrates sinus rhythm, rate 70, axis within normal limits, intervals within normal limits, no acute ST-T wave changes.   Recent Labs: 10/14/2017: ALT 16; BUN 12; Creatinine, Ser 0.72; Hemoglobin 13.4; Platelets 287.0; Potassium 4.5; Sodium 133    Lipid Panel    Component Value Date/Time   CHOL 155 10/14/2017 0857   TRIG 62.0 10/14/2017 0857   HDL 95.40 10/14/2017 0857     CHOLHDL 2 10/14/2017 0857   VLDL 12.4 10/14/2017 0857   LDLCALC 48 10/14/2017 0857   LDLDIRECT 81.0 10/16/2010 0939      Wt Readings from Last 3 Encounters:  12/08/17 186 lb 3.2 oz (84.5 kg)  12/01/17 184 lb (83.5 kg)  09/27/17 180 lb (81.6 kg)      Other studies Reviewed: Additional studies/ records that were reviewed today include: CT . Review of the above records demonstrates:  Please see elsewhere in the note.     ASSESSMENT AND PLAN:   Paroxysmal atrial fibrillation ([place]) -   Ms. Renaldo Harrison  has a CHA2DS2 - VASc score of 2.  He tolerates anticoagulant.  She needs to be on this and I will continue.  She has had no recent symptomatic paroxysms.    Hypertension Her blood pressure actually is running high when apparently was running low at one point when she saw Dr. Rockey Situ.  I am going to increase her Spironolactone to 50 mg daily and she needs to get a basic metabolic profile in 10 days.  She will continue to keep a blood pressure diary.  Hyperlipidemia LDL was OK.    Coronary calcium This was noted and once her BP is controlled she will need a POET (Plain Old Exercise Treadmill)  Current medicines are reviewed at length with the patient today.  The patient does not have concerns regarding medicines.  The following changes have been made:  As above  Labs/ tests ordered today include:   Orders Placed This Encounter  Procedures  . Urinalysis  . EKG 12-Lead     Disposition:   FU with me in 6 weeks    Signed, Minus Breeding, MD  12/08/2017 3:56 PM    [facility]

## 2017-12-08 ENCOUNTER — Encounter: Payer: Self-pay | Admitting: Cardiology

## 2017-12-08 ENCOUNTER — Ambulatory Visit: Payer: BLUE CROSS/BLUE SHIELD | Admitting: Cardiology

## 2017-12-08 VITALS — BP 172/80 | HR 70 | Ht 66.0 in | Wt 186.2 lb

## 2017-12-08 DIAGNOSIS — I10 Essential (primary) hypertension: Secondary | ICD-10-CM

## 2017-12-08 DIAGNOSIS — E785 Hyperlipidemia, unspecified: Secondary | ICD-10-CM | POA: Diagnosis not present

## 2017-12-08 DIAGNOSIS — R931 Abnormal findings on diagnostic imaging of heart and coronary circulation: Secondary | ICD-10-CM

## 2017-12-08 DIAGNOSIS — N390 Urinary tract infection, site not specified: Secondary | ICD-10-CM | POA: Diagnosis not present

## 2017-12-08 DIAGNOSIS — I4891 Unspecified atrial fibrillation: Secondary | ICD-10-CM

## 2017-12-08 MED ORDER — APIXABAN 5 MG PO TABS: 5.0000 mg | ORAL_TABLET | Freq: Two times a day (BID) | ORAL | 11 refills | Status: DC

## 2017-12-08 MED ORDER — SPIRONOLACTONE 50 MG PO TABS: 50.0000 mg | ORAL_TABLET | Freq: Every day | ORAL | 3 refills | Status: DC

## 2017-12-08 NOTE — Patient Instructions (Signed)
Medication Instructions:  INCREASE- Spironolactone 50 mg daily  If you need a refill on your cardiac medications before your next appointment, please call your pharmacy.  Labwork: Urinalysis today BMP in 10 days at PCP office  Testing/Procedures: None Ordered  Follow-Up: Your physician wants you to follow-up in: 6 Weeks.   Thank you for choosing [facility] at [facility]!!

## 2017-12-09 HISTORY — PX: TIBIA FRACTURE SURGERY: SHX806

## 2017-12-09 LAB — URINALYSIS
BILIRUBIN UA: NEGATIVE
Glucose, UA: NEGATIVE
Ketones, UA: NEGATIVE
Leukocytes, UA: NEGATIVE
Nitrite, UA: NEGATIVE
PH UA: 6.5 (ref 5.0–7.5)
Protein, UA: NEGATIVE
RBC, UA: NEGATIVE
Specific Gravity, UA: 1.014 (ref 1.005–1.030)
UUROB: 0.2 mg/dL (ref 0.2–1.0)

## 2017-12-12 DIAGNOSIS — Z79899 Other long term (current) drug therapy: Secondary | ICD-10-CM | POA: Diagnosis not present

## 2017-12-12 DIAGNOSIS — S82109A Unspecified fracture of upper end of unspecified tibia, initial encounter for closed fracture: Secondary | ICD-10-CM | POA: Diagnosis not present

## 2017-12-12 DIAGNOSIS — M79672 Pain in left foot: Secondary | ICD-10-CM | POA: Diagnosis not present

## 2017-12-12 DIAGNOSIS — S82142A Displaced bicondylar fracture of left tibia, initial encounter for closed fracture: Secondary | ICD-10-CM | POA: Diagnosis not present

## 2017-12-12 DIAGNOSIS — S82145A Nondisplaced bicondylar fracture of left tibia, initial encounter for closed fracture: Secondary | ICD-10-CM | POA: Diagnosis not present

## 2017-12-12 DIAGNOSIS — S92302A Fracture of unspecified metatarsal bone(s), left foot, initial encounter for closed fracture: Secondary | ICD-10-CM | POA: Diagnosis not present

## 2017-12-12 DIAGNOSIS — M25562 Pain in left knee: Secondary | ICD-10-CM | POA: Diagnosis not present

## 2017-12-12 DIAGNOSIS — I10 Essential (primary) hypertension: Secondary | ICD-10-CM | POA: Diagnosis not present

## 2017-12-12 DIAGNOSIS — S92322A Displaced fracture of second metatarsal bone, left foot, initial encounter for closed fracture: Secondary | ICD-10-CM | POA: Diagnosis not present

## 2017-12-12 DIAGNOSIS — S92342A Displaced fracture of fourth metatarsal bone, left foot, initial encounter for closed fracture: Secondary | ICD-10-CM | POA: Diagnosis not present

## 2017-12-16 ENCOUNTER — Telehealth: Payer: Self-pay

## 2017-12-16 ENCOUNTER — Encounter: Payer: Self-pay | Admitting: Primary Care

## 2017-12-16 ENCOUNTER — Ambulatory Visit (INDEPENDENT_AMBULATORY_CARE_PROVIDER_SITE_OTHER)
Admission: RE | Admit: 2017-12-16 | Discharge: 2017-12-16 | Disposition: A | Discharge status: Home / Self Care | Payer: BLUE CROSS/BLUE SHIELD | Source: Ambulatory Visit | Attending: Primary Care | Admitting: Primary Care

## 2017-12-16 ENCOUNTER — Ambulatory Visit: Payer: BLUE CROSS/BLUE SHIELD | Admitting: Primary Care

## 2017-12-16 VITALS — BP 162/80 | HR 91 | Temp 98.2°F

## 2017-12-16 DIAGNOSIS — M79605 Pain in left leg: Secondary | ICD-10-CM

## 2017-12-16 DIAGNOSIS — R509 Fever, unspecified: Secondary | ICD-10-CM | POA: Diagnosis not present

## 2017-12-16 DIAGNOSIS — S82142A Displaced bicondylar fracture of left tibia, initial encounter for closed fracture: Secondary | ICD-10-CM | POA: Diagnosis not present

## 2017-12-16 LAB — CBC WITH DIFFERENTIAL/PLATELET
Basophils Absolute: 26 cells/uL (ref 0–200)
Basophils Relative: 0.3 %
EOS PCT: 0.7 %
Eosinophils Absolute: 61 cells/uL (ref 15–500)
HCT: 34.2 % — ABNORMAL LOW (ref 35.0–45.0)
Hemoglobin: 11.8 g/dL (ref 11.7–15.5)
Lymphs Abs: 1001 cells/uL (ref 850–3900)
MCH: 31.4 pg (ref 27.0–33.0)
MCHC: 34.5 g/dL (ref 32.0–36.0)
MCV: 91 fL (ref 80.0–100.0)
MPV: 10.9 fL (ref 7.5–12.5)
Monocytes Relative: 7.6 %
NEUTROS PCT: 79.9 %
Neutro Abs: 6951 cells/uL (ref 1500–7800)
PLATELETS: 312 10*3/uL (ref 140–400)
RBC: 3.76 10*6/uL — AB (ref 3.80–5.10)
RDW: 12.1 % (ref 11.0–15.0)
Total Lymphocyte: 11.5 %
WBC mixed population: 661 cells/uL (ref 200–950)
WBC: 8.7 10*3/uL (ref 3.8–10.8)

## 2017-12-16 NOTE — Patient Instructions (Signed)
Complete xray(s) and lab prior to leaving today. I will notify you of your results once received.  Elevated your leg and apply ice to reduce swelling.  Please go to the hospital if you noticed redness, increased swelling, fevers that won't decrease with Aleve or Percocet.  Follow up with the surgeon as scheduled.   It was a pleasure meeting you!

## 2017-12-16 NOTE — Telephone Encounter (Signed)
Spoke with patient regarding results. She will see trauma surgeon Monday morning next week. Strict ED precautions provided.

## 2017-12-16 NOTE — Telephone Encounter (Signed)
Rhonda at [facility] called report Tibia/fibula on lt. Report is in Epic and also taking hand delivered report to Gentry Fitz NP area.

## 2017-12-16 NOTE — Progress Notes (Signed)
Subjective:    Patient ID: Amanda Estrada, female    DOB: August 18, 1961, 57 y.o.   MRN: [id number]  HPI  Amanda Estrada is a 57 year old female who presents today for emergency department follow up and a chief complaint of fever.  She presented to [facility] Emergency Department on 12/12/17 for a chief complaint of foot pain. She was standing on her bed to open an air vent and fell off and landed on left knee and foot.   She was diagnosed with a left knee "fratcture" and three fractures to metatarsal bones 2, 3, and 4. She has no records today, just discharge paperwork. She has an appointment with the trauma surgeon Monday next week. She has a prescription for Percocet 10/325 mg, she's taking 1/2 tablet twice daily for pain.   She's here today as she noticed a fever of 100 last night. She took an Aleve last night, woke up early this morning in sweats with a break in her fever. She denies fevers today and is taking Aleve regularly and Percocet. She denies cough, rhinorrhea, sore throat, sinus pressure, open wounds, increased swelling. She's not putting weight on her foot. She denies re-injury.   Review of Systems  Constitutional: Positive for fever.  Musculoskeletal:       Left foot, lower extremity, knee pain  Skin: Negative for color change and wound.       Past Medical History:  Diagnosis Date  . Allergy   . Anxiety   . Asthma   . History of echocardiogram    a. 11/2015: EF 55-60%, no RWMA, nl LV diastolic fxn, PASP nl  . Hypertension   . Obesity   . PAF (paroxysmal atrial fibrillation) ([place])    a. CHADS2VASc = 2 (HTN, sex category)  . Sinusitis 2011   Recurrent per Dr. Tami Ribas with [facility]     Social History   Socioeconomic History  . Marital status: Married    Spouse name: Not on file  . Number of children: 2  . Years of education: Not on file  . Highest education level: Not on file  Social Needs  . Financial resource strain: Not on file  . Food insecurity -  worry: Not on file  . Food insecurity - inability: Not on file  . Transportation needs - medical: Not on file  . Transportation needs - non-medical: Not on file  Occupational History  . Occupation: Air cabin crew, Engineer, materials: [employer]  Tobacco Use  . Smoking status: Never Smoker  . Smokeless tobacco: Never Used  Substance and Sexual Activity  . Alcohol use: Yes    Alcohol/week: 8.4 oz    Types: 14 Glasses of wine per week    Comment: 2 glasses of wine every day  . Drug use: No  . Sexual activity: Not on file  Other Topics Concern  . Not on file  Social History Narrative   No regular exercise, used to but unable to do given chronic cough.    Past Surgical History:  Procedure Laterality Date  . BUNIONECTOMY  11/2007   Right foot, 2 hammer-toes and tendon replacement (Dr. Paulla Dolly)  . CERVICAL DISCECTOMY  2001   C5/6  . CESAREAN SECTION  1985   Due to incr BP  . ENDOMETRIAL BIOPSY  03/27/2008   B9 endometrium w/breakdown changes (Dr, Kinscius)  . LASER BRONCHOSCOPY N/A 09/19/2017   Procedure: LASER BRONCHOSCOPY;  Surgeon: Melrose Nakayama, MD;  Location: [facility] OR;  Service: Thoracic;  Laterality: N/A;  . NSVD  1989   VBAC  . Renal artery [place]  02/2007   [place] arteries but no stenosis  . US TRANSVAGINAL PELVIC MODIFIED  04/09/2008   Normal  . VIDEO BRONCHOSCOPY Bilateral 08/10/2017   Procedure: VIDEO BRONCHOSCOPY WITHOUT FLUORO;  Surgeon: Tanda Rockers, MD;  Location: [facility] ENDOSCOPY;  Service: Cardiopulmonary;  Laterality: Bilateral;  . VIDEO BRONCHOSCOPY N/A 09/19/2017   Procedure: VIDEO BRONCHOSCOPY, with endobronchial tumor resection;  Surgeon: Melrose Nakayama, MD;  Location: [facility] OR;  Service: Thoracic;  Laterality: N/A;    Family History  Problem Relation Age of Onset  . Cancer - Colon Mother        partial colectomy  . Melanoma Mother        x2  . Heart disease Father 105       CHF  . Hypertension Father   . Pneumonia  Father   . Colon cancer Father 89  . Colon polyps Sister   . Hypertension Brother   . Colon polyps Brother   . Hypertension Sister   . Colon polyps Sister   . Colon polyps Sister   . Esophageal cancer Neg Hx   . Rectal cancer Neg Hx   . Stomach cancer Neg Hx     Allergies  Allergen Reactions  . Amoxicillin-Pot Clavulanate Other (See Comments)    Diarrhea Has patient had a PCN reaction causing immediate rash, facial/tongue/throat swelling, SOB or lightheadedness with hypotension: No Has patient had a PCN reaction causing severe rash involving mucus membranes or skin necrosis: No Has patient had a PCN reaction that required hospitalization: No Has patient had a PCN reaction occurring within the last 10 years: Yes--diarrhea ONLY If all of the above answers are "NO", then may proceed with Cephalosporin use.     Current Outpatient Medications on File Prior to Visit  Medication Sig Dispense Refill  . albuterol (VENTOLIN HFA) 108 (90 Base) MCG/ACT inhaler Inhale 2 puffs into lungs every 6 hours as needed for wheezing. 18 g 5  . apixaban (ELIQUIS) 5 MG TABS tablet Take 1 tablet (5 mg total) by mouth 2 (two) times daily. 60 tablet 11  . cetirizine (ZYRTEC) 10 MG tablet Take 10 mg by mouth daily.    . cloNIDine (CATAPRES) 0.1 MG tablet Take 0.1 mg by mouth at bedtime.    Marland Kitchen diltiazem (CARDIZEM CD) 240 MG 24 hr capsule TAKE 1 CAPSULE (240 MG TOTAL) BY MOUTH ONCE DAILY.  11  . ELIDEL 1 % cream Apply 1 application daily as needed topically (for allergies under eyes).   2  . fluticasone (FLONASE) 50 MCG/ACT nasal spray USE 1 SPRAY IN THE NOSE DAILY AS DIRECTED 16 g 11  . losartan (COZAAR) 100 MG tablet TAKE 1 TABLET (100 MG TOTAL) BY MOUTH DAILY. 90 tablet 1  . metoprolol succinate (TOPROL-XL) 25 MG 24 hr tablet Take 1 tablet (25 mg total) by mouth daily. (Patient taking differently: Take 25 mg by mouth daily as needed (for AFIB). ) 90 tablet 3  . montelukast (SINGULAIR) 10 MG tablet TAKE 1  TABLET (10 MG TOTAL) BY MOUTH AT BEDTIME. 90 tablet 3  . omeprazole (PRILOSEC) 40 MG capsule Take 40 mg by mouth daily.    Marland Kitchen oxyCODONE-acetaminophen (PERCOCET) 10-325 MG tablet Take 0.5 tablets by mouth every 6 (six) hours as needed.   0  . rosuvastatin (CRESTOR) 20 MG tablet TAKE 1 TABLET (20 MG TOTAL) BY MOUTH DAILY.  90 tablet 3  . spironolactone (ALDACTONE) 50 MG tablet Take 1 tablet (50 mg total) by mouth daily. 90 tablet 3   No current facility-administered medications on file prior to visit.     BP (!) 162/80   Pulse 91   Temp 98.2 F (36.8 C) (Oral)   SpO2 98%    Objective:   Physical Exam  Constitutional: She appears well-nourished.  Neck: Neck supple.  Cardiovascular: Normal rate and regular rhythm.  Pulses:      Dorsalis pedis pulses are 2+ on the right side, and 2+ on the left side.       Posterior tibial pulses are 2+ on the right side, and 2+ on the left side.  Pulmonary/Chest: Effort normal and breath sounds normal.  Musculoskeletal:       Legs: Moderate swelling to left knee, warm to touch. Moderate swelling to left dorsal foot and toes.  No signs of compartment syndrome. Moderate tenderness to left medial lower tib/fib region, no deformity, erythema, bruising.  Skin: Skin is warm and dry. No erythema.  Moderate swelling to left knee, warm to touch. Moderate swelling to left dorsal foot and toes.  No signs of compartment syndrome. Moderate tenderness to left medial lower tib/fib region, no deformity, erythema, bruising.          Assessment & Plan:  Fever:  Known knee "fracture" and metatarsal fractures. Checking plain films of left tib/fib given severe tenderness. Doubt thrombosis as it does not present that way and also given Eliquis dose. No signs of compartment syndrome. No other reason for fever etiology except for trauma and inflammation.  Check CBC today.  Strict emergency department precautions provided.  Pleas Koch, NP

## 2017-12-19 DIAGNOSIS — S82102A Unspecified fracture of upper end of left tibia, initial encounter for closed fracture: Secondary | ICD-10-CM | POA: Diagnosis not present

## 2017-12-19 DIAGNOSIS — S92322A Displaced fracture of second metatarsal bone, left foot, initial encounter for closed fracture: Secondary | ICD-10-CM | POA: Diagnosis not present

## 2017-12-19 DIAGNOSIS — S92342A Displaced fracture of fourth metatarsal bone, left foot, initial encounter for closed fracture: Secondary | ICD-10-CM | POA: Diagnosis not present

## 2017-12-19 DIAGNOSIS — S92332A Displaced fracture of third metatarsal bone, left foot, initial encounter for closed fracture: Secondary | ICD-10-CM | POA: Diagnosis not present

## 2017-12-20 ENCOUNTER — Ambulatory Visit: Payer: BLUE CROSS/BLUE SHIELD | Admitting: Cardiology

## 2017-12-21 ENCOUNTER — Telehealth: Payer: Self-pay | Admitting: Family Medicine

## 2017-12-21 NOTE — Telephone Encounter (Signed)
Copied from [place] [id number]. Topic: Referral - Request >> Dec 21, 2017  9:21 AM Burnis Medin, NT wrote: CRM for notification. See Telephone encounter for: Patient called and said she had a referral to [place] and they didn't take her insurance. Pt wanted to see if she can get another referral to a doctor that will except her insurance preferably [facility].   would like a call back.   12/21/17.

## 2017-12-22 DIAGNOSIS — M7989 Other specified soft tissue disorders: Secondary | ICD-10-CM | POA: Diagnosis not present

## 2017-12-22 DIAGNOSIS — S82142A Displaced bicondylar fracture of left tibia, initial encounter for closed fracture: Secondary | ICD-10-CM | POA: Diagnosis not present

## 2017-12-22 DIAGNOSIS — M79662 Pain in left lower leg: Secondary | ICD-10-CM | POA: Diagnosis not present

## 2017-12-22 DIAGNOSIS — S82132A Displaced fracture of medial condyle of left tibia, initial encounter for closed fracture: Secondary | ICD-10-CM | POA: Diagnosis not present

## 2017-12-22 DIAGNOSIS — I4891 Unspecified atrial fibrillation: Secondary | ICD-10-CM | POA: Diagnosis not present

## 2017-12-22 NOTE — Telephone Encounter (Signed)
Called the patient. She has already gotten referral from the trauma surgeon Dr Marcelino Scot to a Ortho surgeon at [facility]. I advised her to call to check on her benefits to ensure that [facility] is in her insurance network. She will call us back if she needs anything else.

## 2017-12-26 DIAGNOSIS — G8918 Other acute postprocedural pain: Secondary | ICD-10-CM | POA: Diagnosis not present

## 2017-12-26 DIAGNOSIS — I4891 Unspecified atrial fibrillation: Secondary | ICD-10-CM | POA: Diagnosis not present

## 2017-12-26 DIAGNOSIS — S82142A Displaced bicondylar fracture of left tibia, initial encounter for closed fracture: Secondary | ICD-10-CM | POA: Diagnosis not present

## 2017-12-26 DIAGNOSIS — S82132A Displaced fracture of medial condyle of left tibia, initial encounter for closed fracture: Secondary | ICD-10-CM | POA: Diagnosis not present

## 2017-12-26 DIAGNOSIS — Z472 Encounter for removal of internal fixation device: Secondary | ICD-10-CM | POA: Diagnosis not present

## 2017-12-27 DIAGNOSIS — S82142A Displaced bicondylar fracture of left tibia, initial encounter for closed fracture: Secondary | ICD-10-CM | POA: Diagnosis not present

## 2017-12-27 DIAGNOSIS — Z472 Encounter for removal of internal fixation device: Secondary | ICD-10-CM | POA: Diagnosis not present

## 2017-12-27 DIAGNOSIS — I4891 Unspecified atrial fibrillation: Secondary | ICD-10-CM | POA: Diagnosis not present

## 2017-12-27 MED ORDER — ALBUTEROL SULFATE HFA 108 (90 BASE) MCG/ACT IN AERS
2.00 | INHALATION_SPRAY | RESPIRATORY_TRACT | Status: DC
Start: ? — End: 2017-12-27

## 2017-12-27 MED ORDER — ACETAMINOPHEN 325 MG PO TABS
650.00 mg | ORAL_TABLET | ORAL | Status: DC
Start: ? — End: 2017-12-27

## 2017-12-27 MED ORDER — GENERIC EXTERNAL MEDICATION
Status: DC
Start: ? — End: 2017-12-27

## 2017-12-27 MED ORDER — CETIRIZINE HCL 10 MG PO TABS
10.00 mg | ORAL_TABLET | ORAL | Status: DC
Start: 2017-12-28 — End: 2017-12-27

## 2017-12-27 MED ORDER — OXYCODONE HCL 5 MG PO TABS
10.00 mg | ORAL_TABLET | ORAL | Status: DC
Start: ? — End: 2017-12-27

## 2017-12-27 MED ORDER — POLYETHYLENE GLYCOL 3350 17 G PO PACK
17.00 | PACK | ORAL | Status: DC
Start: ? — End: 2017-12-27

## 2017-12-27 MED ORDER — LOSARTAN POTASSIUM 100 MG PO TABS
100.00 mg | ORAL_TABLET | ORAL | Status: DC
Start: 2017-12-28 — End: 2017-12-27

## 2017-12-27 MED ORDER — ONDANSETRON HCL 4 MG/2ML IJ SOLN
4.00 mg | INTRAMUSCULAR | Status: DC
Start: ? — End: 2017-12-27

## 2017-12-27 MED ORDER — FLUTICASONE PROPIONATE 50 MCG/ACT NA SUSP
1.00 | NASAL | Status: DC
Start: 2017-12-28 — End: 2017-12-27

## 2017-12-27 MED ORDER — METOPROLOL SUCCINATE ER 25 MG PO TB24
25.00 mg | ORAL_TABLET | ORAL | Status: DC
Start: 2017-12-28 — End: 2017-12-27

## 2017-12-27 MED ORDER — CLONIDINE HCL 0.1 MG PO TABS
0.10 mg | ORAL_TABLET | ORAL | Status: DC
Start: 2017-12-28 — End: 2017-12-27

## 2017-12-27 MED ORDER — DOCUSATE SODIUM 100 MG PO CAPS
100.00 mg | ORAL_CAPSULE | ORAL | Status: DC
Start: 2017-12-27 — End: 2017-12-27

## 2017-12-27 MED ORDER — DILTIAZEM HCL ER COATED BEADS 240 MG PO CP24
240.00 mg | ORAL_CAPSULE | ORAL | Status: DC
Start: 2017-12-28 — End: 2017-12-27

## 2017-12-27 MED ORDER — APIXABAN 5 MG PO TABS
5.00 mg | ORAL_TABLET | ORAL | Status: DC
Start: 2017-12-27 — End: 2017-12-27

## 2017-12-27 MED ORDER — MONTELUKAST SODIUM 10 MG PO TABS
10.00 mg | ORAL_TABLET | ORAL | Status: DC
Start: 2017-12-27 — End: 2017-12-27

## 2017-12-27 MED ORDER — ATORVASTATIN CALCIUM 40 MG PO TABS
40.00 mg | ORAL_TABLET | ORAL | Status: DC
Start: 2017-12-28 — End: 2017-12-27

## 2017-12-27 MED ORDER — SPIRONOLACTONE 25 MG PO TABS
50.00 mg | ORAL_TABLET | ORAL | Status: DC
Start: 2017-12-28 — End: 2017-12-27

## 2018-01-01 IMAGING — CR DG CHEST 2V
2 series · 2 of 2 positions shown · non-contrast
Comparison: CT chest 07/29/2007.  Chest 2 separate

CLINICAL DATA: Left lung mass.  Preop

EXAM:
CHEST  2 VIEW

[w chest pa]
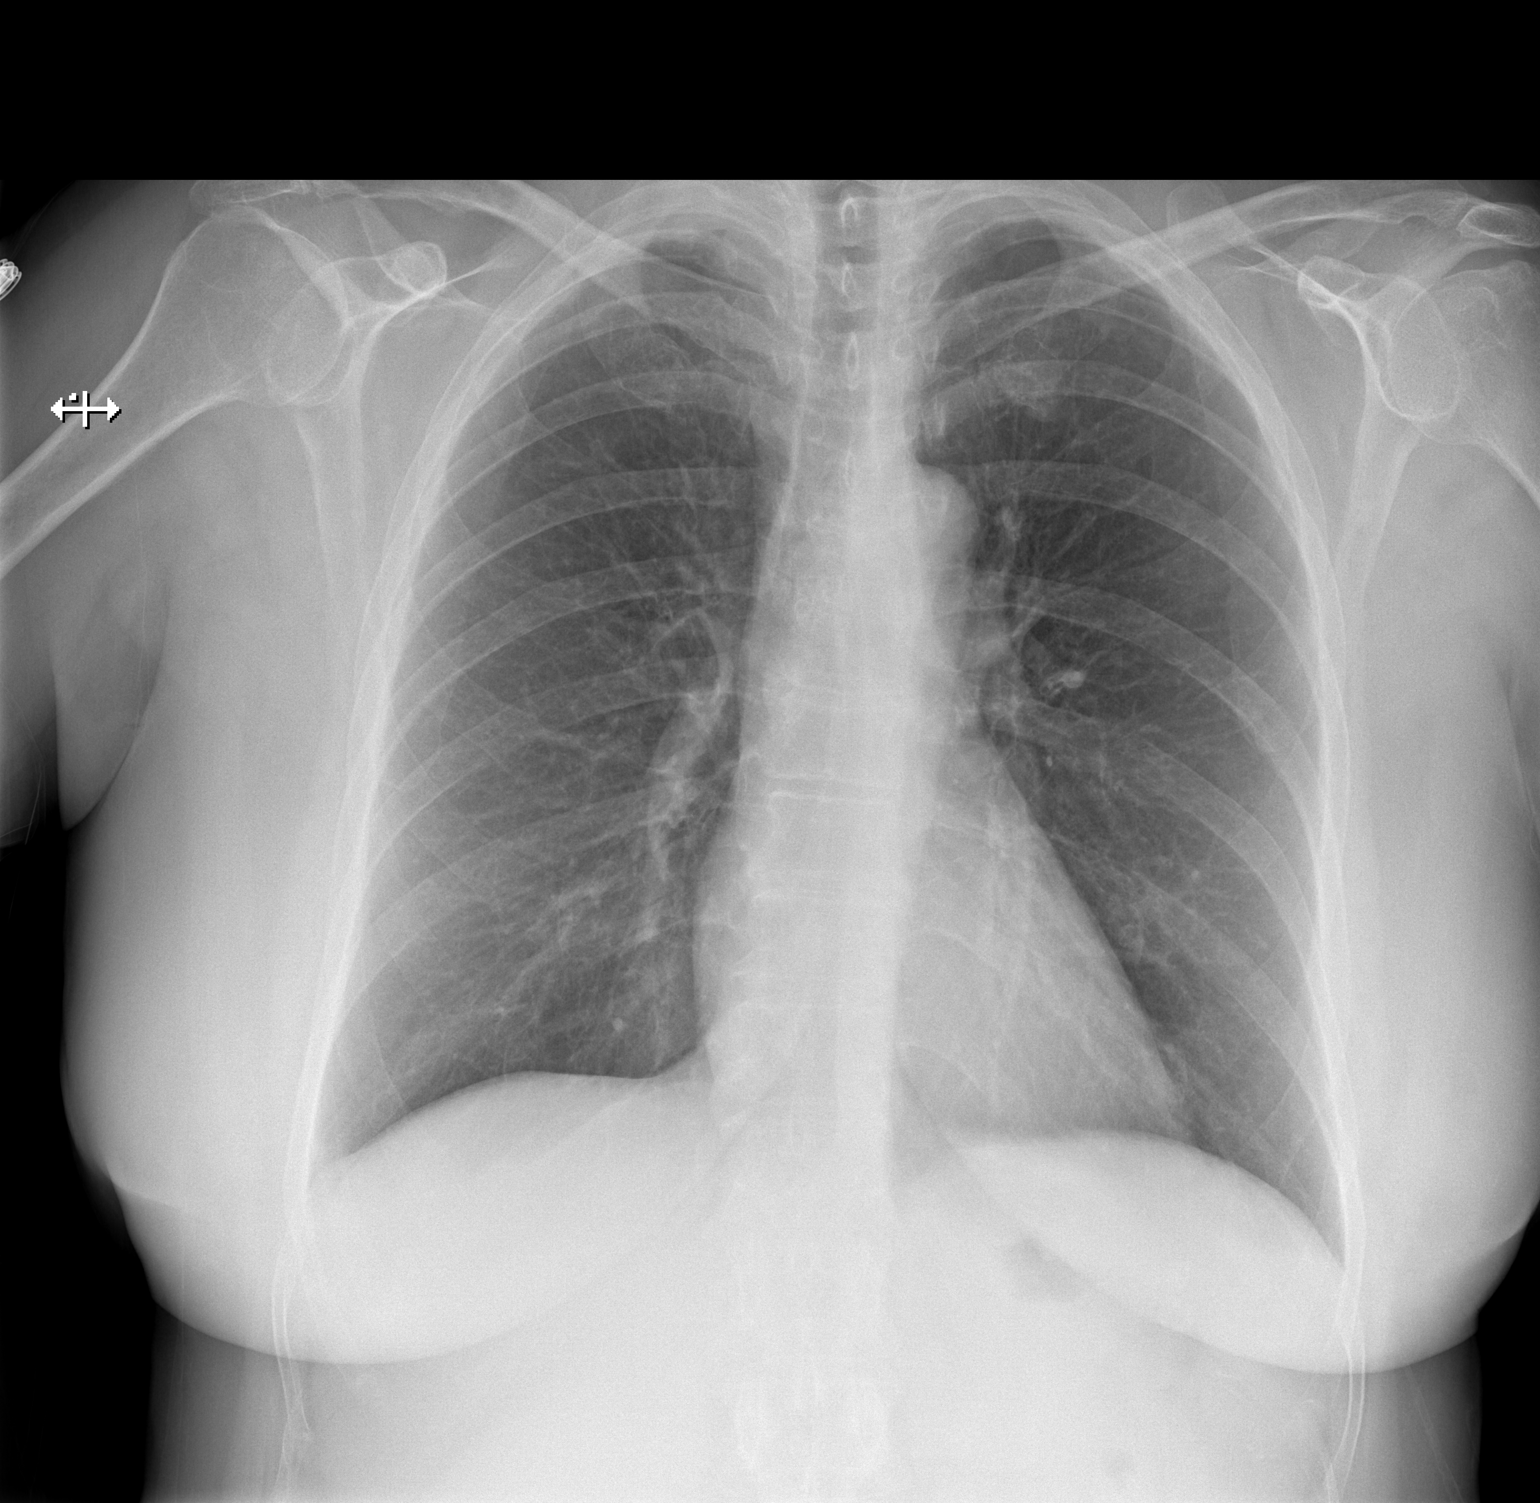

[w chest lat]
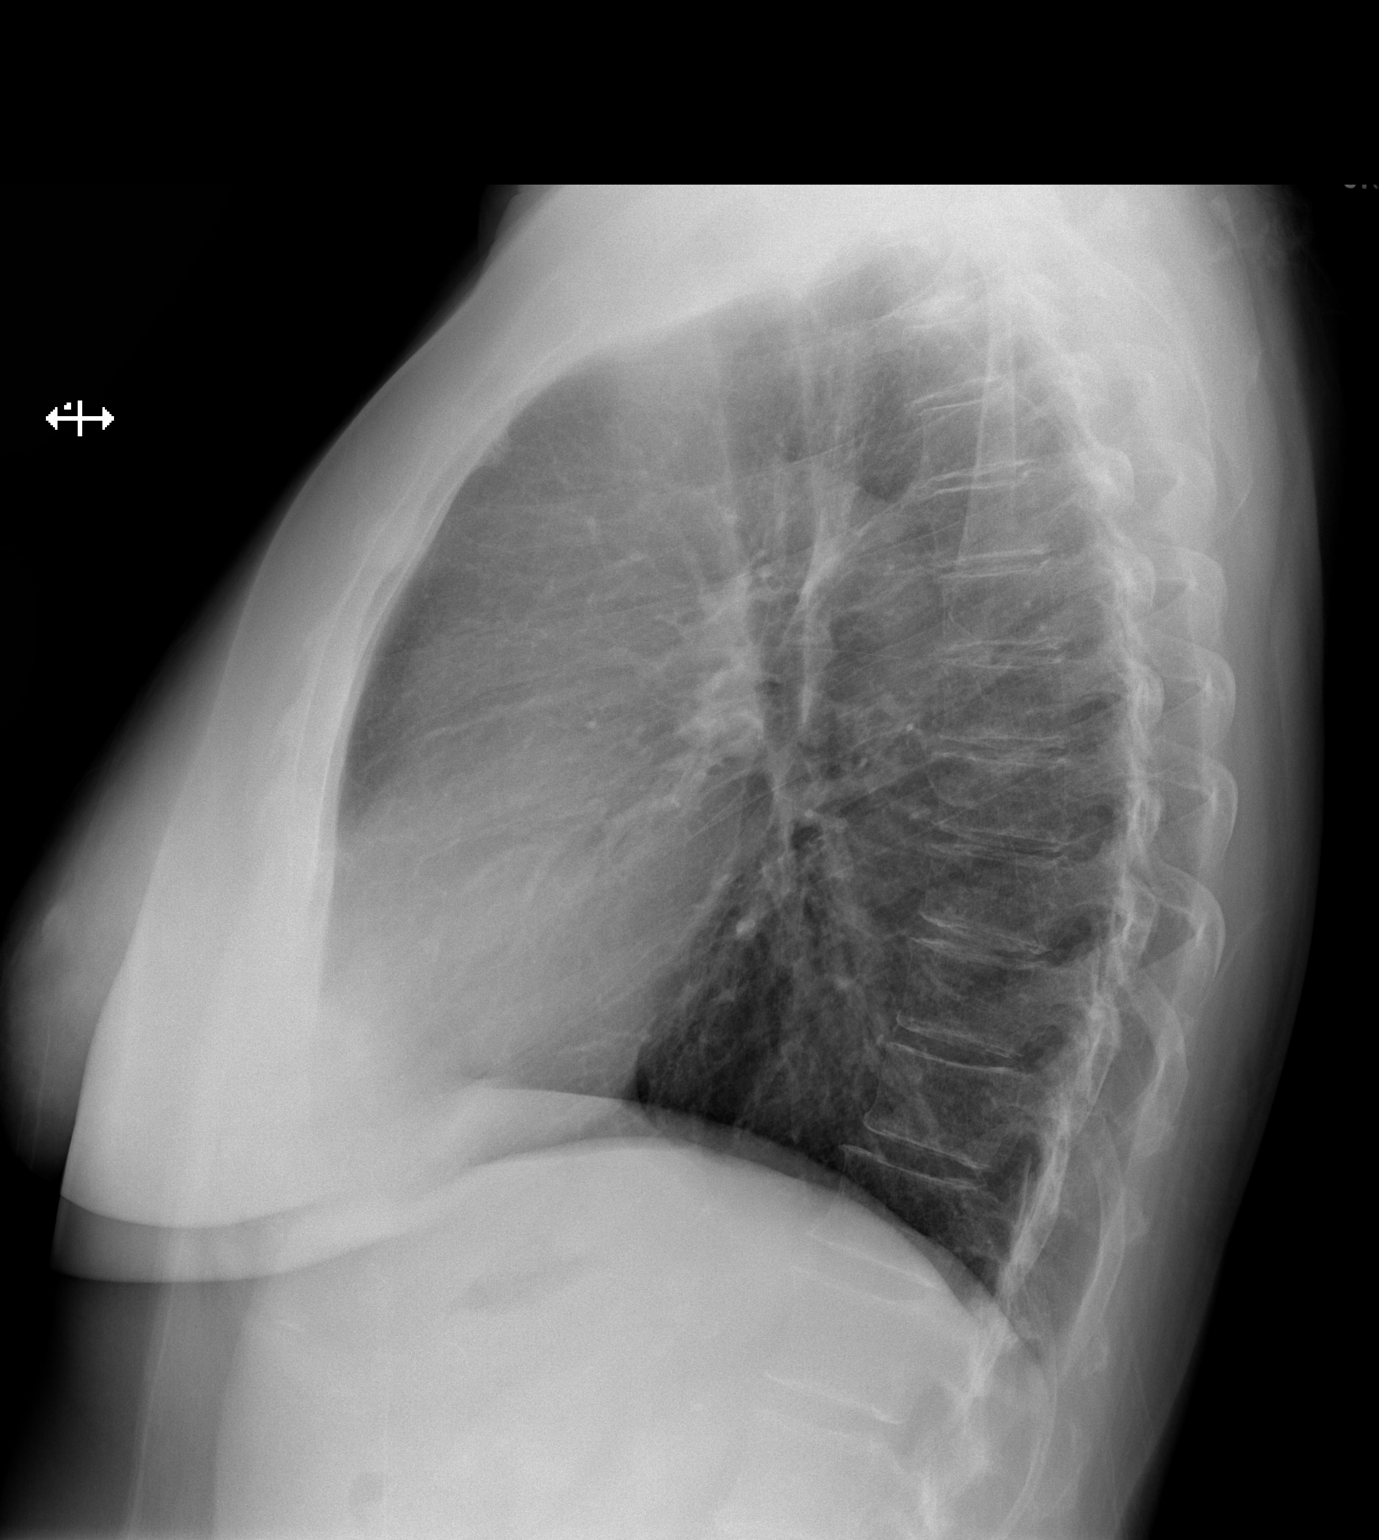

[2 of 2 positions shown; findings below may reference images not displayed]

FINDINGS: The heart size and mediastinal contours are within normal limits.
Both lungs are clear. The visualized skeletal structures are
unremarkable.
IMPRESSION: No active cardiopulmonary disease.

## 2018-01-04 ENCOUNTER — Encounter: Payer: Self-pay | Admitting: Cardiology

## 2018-01-04 MED ORDER — DILTIAZEM HCL ER COATED BEADS 240 MG PO CP24: ORAL_CAPSULE | ORAL | 1 refills | Status: DC

## 2018-01-04 MED ORDER — ROSUVASTATIN CALCIUM 20 MG PO TABS: 20.0000 mg | ORAL_TABLET | Freq: Every day | ORAL | 1 refills | Status: DC

## 2018-01-04 MED ORDER — CLONIDINE HCL 0.1 MG PO TABS: 0.1000 mg | ORAL_TABLET | Freq: Every day | ORAL | 1 refills | Status: DC

## 2018-01-14 ENCOUNTER — Encounter: Payer: Self-pay | Admitting: Gastroenterology

## 2018-01-18 ENCOUNTER — Telehealth (INDEPENDENT_AMBULATORY_CARE_PROVIDER_SITE_OTHER): Payer: BLUE CROSS/BLUE SHIELD | Admitting: Family Medicine

## 2018-01-18 DIAGNOSIS — R300 Dysuria: Secondary | ICD-10-CM

## 2018-01-18 LAB — URINALYSIS, MICROSCOPIC ONLY

## 2018-01-18 LAB — POC URINALSYSI DIPSTICK (AUTOMATED)
BILIRUBIN UA: NEGATIVE
Blood, UA: NEGATIVE
Glucose, UA: NEGATIVE
Ketones, UA: NEGATIVE
NITRITE UA: NEGATIVE
PH UA: 6.5 (ref 5.0–8.0)
Protein, UA: NEGATIVE
Spec Grav, UA: 1.015 (ref 1.010–1.025)
UROBILINOGEN UA: 0.2 U/dL

## 2018-01-18 MED ORDER — SULFAMETHOXAZOLE-TRIMETHOPRIM 800-160 MG PO TABS: 1.0000 | ORAL_TABLET | Freq: Two times a day (BID) | ORAL | 0 refills | Status: DC

## 2018-01-18 NOTE — Telephone Encounter (Signed)
Zoha notified as instructed by telephone.  Her mom is bringing urine sample to office now.  Vanetta is asking that something be called in today while waiting on urine culture results.  [facility] [address]

## 2018-01-18 NOTE — Addendum Note (Signed)
Addended by: Carter Kitten on: 01/18/2018 04:05 PM   Modules accepted: Orders

## 2018-01-18 NOTE — Addendum Note (Signed)
Addended by: Carter Kitten on: 01/18/2018 03:03 PM   Modules accepted: Orders

## 2018-01-18 NOTE — Telephone Encounter (Signed)
Given extenuating circumstances.. Have her drop of urine and send to lab for UA and micro as well as culture given I am not there to read it.

## 2018-01-18 NOTE — Telephone Encounter (Signed)
[facility] [address] as instructed by Dr. Lorelei Pont.  Amanda Estrada notified by telephone.

## 2018-01-18 NOTE — Telephone Encounter (Signed)
Pt had surgery on her leg and want to know if something will be called in the pharmacy for her for a [facility]TI at [facility] [address]

## 2018-01-18 NOTE — Telephone Encounter (Signed)
Septra-ds, 1 po bid, #14

## 2018-01-18 NOTE — Telephone Encounter (Signed)
Copied from [place] [id number]. Topic: Quick Communication - See Telephone Encounter >> Jan 18, 2018  8:56 AM Bea Graff, NT wrote: CRM for notification. See Telephone encounter for: Pt calling and states she believes she has UTI. She has lots of pressure and pain and having a hard time urinating. She wants to see if she can give a sample at home and have someone bring it in for her because she is in a cast with a broken foot and unable to get there. She has taken AZO for 1 day as well. CB#: [phone]  01/18/18.

## 2018-01-20 DIAGNOSIS — M79672 Pain in left foot: Secondary | ICD-10-CM | POA: Diagnosis not present

## 2018-01-20 LAB — URINE CULTURE
MICRO NUMBER: 90319714
SPECIMEN QUALITY:: ADEQUATE

## 2018-01-27 ENCOUNTER — Ambulatory Visit: Payer: BLUE CROSS/BLUE SHIELD | Admitting: Cardiology

## 2018-01-31 DIAGNOSIS — M79672 Pain in left foot: Secondary | ICD-10-CM | POA: Diagnosis not present

## 2018-01-31 DIAGNOSIS — R600 Localized edema: Secondary | ICD-10-CM | POA: Diagnosis not present

## 2018-01-31 DIAGNOSIS — S92345A Nondisplaced fracture of fourth metatarsal bone, left foot, initial encounter for closed fracture: Secondary | ICD-10-CM | POA: Diagnosis not present

## 2018-01-31 DIAGNOSIS — S92335A Nondisplaced fracture of third metatarsal bone, left foot, initial encounter for closed fracture: Secondary | ICD-10-CM | POA: Diagnosis not present

## 2018-01-31 DIAGNOSIS — S92325A Nondisplaced fracture of second metatarsal bone, left foot, initial encounter for closed fracture: Secondary | ICD-10-CM | POA: Diagnosis not present

## 2018-02-02 DIAGNOSIS — M25572 Pain in left ankle and joints of left foot: Secondary | ICD-10-CM | POA: Diagnosis not present

## 2018-02-02 DIAGNOSIS — S82142D Displaced bicondylar fracture of left tibia, subsequent encounter for closed fracture with routine healing: Secondary | ICD-10-CM | POA: Diagnosis not present

## 2018-02-07 DIAGNOSIS — S92343D Displaced fracture of fourth metatarsal bone, unspecified foot, subsequent encounter for fracture with routine healing: Secondary | ICD-10-CM | POA: Diagnosis not present

## 2018-02-07 DIAGNOSIS — S92302D Fracture of unspecified metatarsal bone(s), left foot, subsequent encounter for fracture with routine healing: Secondary | ICD-10-CM | POA: Diagnosis not present

## 2018-02-07 DIAGNOSIS — S82132D Displaced fracture of medial condyle of left tibia, subsequent encounter for closed fracture with routine healing: Secondary | ICD-10-CM | POA: Diagnosis not present

## 2018-02-07 DIAGNOSIS — S82142A Displaced bicondylar fracture of left tibia, initial encounter for closed fracture: Secondary | ICD-10-CM | POA: Diagnosis not present

## 2018-02-08 DIAGNOSIS — S82142D Displaced bicondylar fracture of left tibia, subsequent encounter for closed fracture with routine healing: Secondary | ICD-10-CM | POA: Diagnosis not present

## 2018-02-08 DIAGNOSIS — M25572 Pain in left ankle and joints of left foot: Secondary | ICD-10-CM | POA: Diagnosis not present

## 2018-02-10 DIAGNOSIS — S82142D Displaced bicondylar fracture of left tibia, subsequent encounter for closed fracture with routine healing: Secondary | ICD-10-CM | POA: Diagnosis not present

## 2018-02-10 DIAGNOSIS — M25572 Pain in left ankle and joints of left foot: Secondary | ICD-10-CM | POA: Diagnosis not present

## 2018-02-13 DIAGNOSIS — M25572 Pain in left ankle and joints of left foot: Secondary | ICD-10-CM | POA: Diagnosis not present

## 2018-02-13 DIAGNOSIS — S82142D Displaced bicondylar fracture of left tibia, subsequent encounter for closed fracture with routine healing: Secondary | ICD-10-CM | POA: Diagnosis not present

## 2018-02-15 DIAGNOSIS — M25572 Pain in left ankle and joints of left foot: Secondary | ICD-10-CM | POA: Diagnosis not present

## 2018-02-15 DIAGNOSIS — S82142D Displaced bicondylar fracture of left tibia, subsequent encounter for closed fracture with routine healing: Secondary | ICD-10-CM | POA: Diagnosis not present

## 2018-02-17 DIAGNOSIS — M25572 Pain in left ankle and joints of left foot: Secondary | ICD-10-CM | POA: Diagnosis not present

## 2018-02-17 DIAGNOSIS — S82142D Displaced bicondylar fracture of left tibia, subsequent encounter for closed fracture with routine healing: Secondary | ICD-10-CM | POA: Diagnosis not present

## 2018-02-20 DIAGNOSIS — S82142D Displaced bicondylar fracture of left tibia, subsequent encounter for closed fracture with routine healing: Secondary | ICD-10-CM | POA: Diagnosis not present

## 2018-02-20 DIAGNOSIS — M25572 Pain in left ankle and joints of left foot: Secondary | ICD-10-CM | POA: Diagnosis not present

## 2018-02-22 DIAGNOSIS — S82142D Displaced bicondylar fracture of left tibia, subsequent encounter for closed fracture with routine healing: Secondary | ICD-10-CM | POA: Diagnosis not present

## 2018-02-22 DIAGNOSIS — M25572 Pain in left ankle and joints of left foot: Secondary | ICD-10-CM | POA: Diagnosis not present

## 2018-02-23 DIAGNOSIS — S82142D Displaced bicondylar fracture of left tibia, subsequent encounter for closed fracture with routine healing: Secondary | ICD-10-CM | POA: Diagnosis not present

## 2018-02-23 DIAGNOSIS — M25572 Pain in left ankle and joints of left foot: Secondary | ICD-10-CM | POA: Diagnosis not present

## 2018-02-27 DIAGNOSIS — S82142D Displaced bicondylar fracture of left tibia, subsequent encounter for closed fracture with routine healing: Secondary | ICD-10-CM | POA: Diagnosis not present

## 2018-02-27 DIAGNOSIS — M25572 Pain in left ankle and joints of left foot: Secondary | ICD-10-CM | POA: Diagnosis not present

## 2018-03-01 DIAGNOSIS — S82142D Displaced bicondylar fracture of left tibia, subsequent encounter for closed fracture with routine healing: Secondary | ICD-10-CM | POA: Diagnosis not present

## 2018-03-01 DIAGNOSIS — M25572 Pain in left ankle and joints of left foot: Secondary | ICD-10-CM | POA: Diagnosis not present

## 2018-03-03 DIAGNOSIS — M25572 Pain in left ankle and joints of left foot: Secondary | ICD-10-CM | POA: Diagnosis not present

## 2018-03-03 DIAGNOSIS — S82142D Displaced bicondylar fracture of left tibia, subsequent encounter for closed fracture with routine healing: Secondary | ICD-10-CM | POA: Diagnosis not present

## 2018-03-06 DIAGNOSIS — M25572 Pain in left ankle and joints of left foot: Secondary | ICD-10-CM | POA: Diagnosis not present

## 2018-03-06 DIAGNOSIS — S82142D Displaced bicondylar fracture of left tibia, subsequent encounter for closed fracture with routine healing: Secondary | ICD-10-CM | POA: Diagnosis not present

## 2018-03-07 DIAGNOSIS — S92332D Displaced fracture of third metatarsal bone, left foot, subsequent encounter for fracture with routine healing: Secondary | ICD-10-CM | POA: Diagnosis not present

## 2018-03-07 DIAGNOSIS — S82142D Displaced bicondylar fracture of left tibia, subsequent encounter for closed fracture with routine healing: Secondary | ICD-10-CM | POA: Diagnosis not present

## 2018-03-07 DIAGNOSIS — S92322D Displaced fracture of second metatarsal bone, left foot, subsequent encounter for fracture with routine healing: Secondary | ICD-10-CM | POA: Diagnosis not present

## 2018-03-07 DIAGNOSIS — S92342D Displaced fracture of fourth metatarsal bone, left foot, subsequent encounter for fracture with routine healing: Secondary | ICD-10-CM | POA: Diagnosis not present

## 2018-03-08 DIAGNOSIS — S82142D Displaced bicondylar fracture of left tibia, subsequent encounter for closed fracture with routine healing: Secondary | ICD-10-CM | POA: Diagnosis not present

## 2018-03-08 DIAGNOSIS — M25572 Pain in left ankle and joints of left foot: Secondary | ICD-10-CM | POA: Diagnosis not present

## 2018-03-10 DIAGNOSIS — M25572 Pain in left ankle and joints of left foot: Secondary | ICD-10-CM | POA: Diagnosis not present

## 2018-03-10 DIAGNOSIS — S82142D Displaced bicondylar fracture of left tibia, subsequent encounter for closed fracture with routine healing: Secondary | ICD-10-CM | POA: Diagnosis not present

## 2018-03-13 DIAGNOSIS — S82142D Displaced bicondylar fracture of left tibia, subsequent encounter for closed fracture with routine healing: Secondary | ICD-10-CM | POA: Diagnosis not present

## 2018-03-13 DIAGNOSIS — M25572 Pain in left ankle and joints of left foot: Secondary | ICD-10-CM | POA: Diagnosis not present

## 2018-03-17 DIAGNOSIS — M25572 Pain in left ankle and joints of left foot: Secondary | ICD-10-CM | POA: Diagnosis not present

## 2018-03-17 DIAGNOSIS — S82142D Displaced bicondylar fracture of left tibia, subsequent encounter for closed fracture with routine healing: Secondary | ICD-10-CM | POA: Diagnosis not present

## 2018-03-21 DIAGNOSIS — M25572 Pain in left ankle and joints of left foot: Secondary | ICD-10-CM | POA: Diagnosis not present

## 2018-03-21 DIAGNOSIS — S82142D Displaced bicondylar fracture of left tibia, subsequent encounter for closed fracture with routine healing: Secondary | ICD-10-CM | POA: Diagnosis not present

## 2018-03-22 DIAGNOSIS — M25572 Pain in left ankle and joints of left foot: Secondary | ICD-10-CM | POA: Diagnosis not present

## 2018-03-22 DIAGNOSIS — S82142D Displaced bicondylar fracture of left tibia, subsequent encounter for closed fracture with routine healing: Secondary | ICD-10-CM | POA: Diagnosis not present

## 2018-03-24 DIAGNOSIS — M25572 Pain in left ankle and joints of left foot: Secondary | ICD-10-CM | POA: Diagnosis not present

## 2018-03-24 DIAGNOSIS — S82142D Displaced bicondylar fracture of left tibia, subsequent encounter for closed fracture with routine healing: Secondary | ICD-10-CM | POA: Diagnosis not present

## 2018-03-27 DIAGNOSIS — S82142D Displaced bicondylar fracture of left tibia, subsequent encounter for closed fracture with routine healing: Secondary | ICD-10-CM | POA: Diagnosis not present

## 2018-03-27 DIAGNOSIS — M25572 Pain in left ankle and joints of left foot: Secondary | ICD-10-CM | POA: Diagnosis not present

## 2018-03-28 DIAGNOSIS — M25672 Stiffness of left ankle, not elsewhere classified: Secondary | ICD-10-CM | POA: Diagnosis not present

## 2018-03-28 DIAGNOSIS — S82142D Displaced bicondylar fracture of left tibia, subsequent encounter for closed fracture with routine healing: Secondary | ICD-10-CM | POA: Diagnosis not present

## 2018-03-28 DIAGNOSIS — R2242 Localized swelling, mass and lump, left lower limb: Secondary | ICD-10-CM | POA: Diagnosis not present

## 2018-03-28 DIAGNOSIS — M25572 Pain in left ankle and joints of left foot: Secondary | ICD-10-CM | POA: Diagnosis not present

## 2018-03-29 DIAGNOSIS — M25572 Pain in left ankle and joints of left foot: Secondary | ICD-10-CM | POA: Diagnosis not present

## 2018-03-29 DIAGNOSIS — S82142D Displaced bicondylar fracture of left tibia, subsequent encounter for closed fracture with routine healing: Secondary | ICD-10-CM | POA: Diagnosis not present

## 2018-03-31 DIAGNOSIS — M25572 Pain in left ankle and joints of left foot: Secondary | ICD-10-CM | POA: Diagnosis not present

## 2018-03-31 DIAGNOSIS — S82142D Displaced bicondylar fracture of left tibia, subsequent encounter for closed fracture with routine healing: Secondary | ICD-10-CM | POA: Diagnosis not present

## 2018-04-05 DIAGNOSIS — S82142D Displaced bicondylar fracture of left tibia, subsequent encounter for closed fracture with routine healing: Secondary | ICD-10-CM | POA: Diagnosis not present

## 2018-04-05 DIAGNOSIS — M25572 Pain in left ankle and joints of left foot: Secondary | ICD-10-CM | POA: Diagnosis not present

## 2018-04-07 DIAGNOSIS — M25572 Pain in left ankle and joints of left foot: Secondary | ICD-10-CM | POA: Diagnosis not present

## 2018-04-07 DIAGNOSIS — S82142D Displaced bicondylar fracture of left tibia, subsequent encounter for closed fracture with routine healing: Secondary | ICD-10-CM | POA: Diagnosis not present

## 2018-04-14 ENCOUNTER — Other Ambulatory Visit: Payer: Self-pay | Admitting: Family Medicine

## 2018-04-17 DIAGNOSIS — S82142D Displaced bicondylar fracture of left tibia, subsequent encounter for closed fracture with routine healing: Secondary | ICD-10-CM | POA: Diagnosis not present

## 2018-04-17 DIAGNOSIS — M25572 Pain in left ankle and joints of left foot: Secondary | ICD-10-CM | POA: Diagnosis not present

## 2018-04-18 DIAGNOSIS — M7989 Other specified soft tissue disorders: Secondary | ICD-10-CM | POA: Diagnosis not present

## 2018-04-19 DIAGNOSIS — M25572 Pain in left ankle and joints of left foot: Secondary | ICD-10-CM | POA: Diagnosis not present

## 2018-04-19 DIAGNOSIS — S82142D Displaced bicondylar fracture of left tibia, subsequent encounter for closed fracture with routine healing: Secondary | ICD-10-CM | POA: Diagnosis not present

## 2018-04-21 DIAGNOSIS — M25572 Pain in left ankle and joints of left foot: Secondary | ICD-10-CM | POA: Diagnosis not present

## 2018-04-21 DIAGNOSIS — S82142D Displaced bicondylar fracture of left tibia, subsequent encounter for closed fracture with routine healing: Secondary | ICD-10-CM | POA: Diagnosis not present

## 2018-04-24 DIAGNOSIS — M25572 Pain in left ankle and joints of left foot: Secondary | ICD-10-CM | POA: Diagnosis not present

## 2018-04-24 DIAGNOSIS — S82142D Displaced bicondylar fracture of left tibia, subsequent encounter for closed fracture with routine healing: Secondary | ICD-10-CM | POA: Diagnosis not present

## 2018-04-26 DIAGNOSIS — M25572 Pain in left ankle and joints of left foot: Secondary | ICD-10-CM | POA: Diagnosis not present

## 2018-04-26 DIAGNOSIS — S82142D Displaced bicondylar fracture of left tibia, subsequent encounter for closed fracture with routine healing: Secondary | ICD-10-CM | POA: Diagnosis not present

## 2018-04-28 DIAGNOSIS — M25572 Pain in left ankle and joints of left foot: Secondary | ICD-10-CM | POA: Diagnosis not present

## 2018-04-28 DIAGNOSIS — S82142D Displaced bicondylar fracture of left tibia, subsequent encounter for closed fracture with routine healing: Secondary | ICD-10-CM | POA: Diagnosis not present

## 2018-05-16 DIAGNOSIS — H40033 Anatomical narrow angle, bilateral: Secondary | ICD-10-CM | POA: Diagnosis not present

## 2018-05-29 DIAGNOSIS — H2511 Age-related nuclear cataract, right eye: Secondary | ICD-10-CM | POA: Diagnosis not present

## 2018-05-29 DIAGNOSIS — H25811 Combined forms of age-related cataract, right eye: Secondary | ICD-10-CM | POA: Diagnosis not present

## 2018-05-29 DIAGNOSIS — Z01818 Encounter for other preprocedural examination: Secondary | ICD-10-CM | POA: Diagnosis not present

## 2018-06-01 ENCOUNTER — Encounter: Payer: Self-pay | Admitting: Gastroenterology

## 2018-06-08 ENCOUNTER — Encounter: Payer: Self-pay | Admitting: Family Medicine

## 2018-06-08 HISTORY — PX: OTHER SURGICAL HISTORY: SHX169

## 2018-06-09 ENCOUNTER — Encounter: Payer: Self-pay | Admitting: Family Medicine

## 2018-06-09 ENCOUNTER — Ambulatory Visit: Payer: BLUE CROSS/BLUE SHIELD | Admitting: Family Medicine

## 2018-06-09 VITALS — BP 130/74 | HR 67 | Temp 97.8°F | Ht 66.0 in | Wt 171.5 lb

## 2018-06-09 DIAGNOSIS — N3000 Acute cystitis without hematuria: Secondary | ICD-10-CM

## 2018-06-09 DIAGNOSIS — N390 Urinary tract infection, site not specified: Secondary | ICD-10-CM | POA: Insufficient documentation

## 2018-06-09 LAB — POC URINALSYSI DIPSTICK (AUTOMATED)
Bilirubin, UA: NEGATIVE
GLUCOSE UA: NEGATIVE
Ketones, UA: NEGATIVE
NITRITE UA: NEGATIVE
Protein, UA: NEGATIVE
RBC UA: NEGATIVE
Spec Grav, UA: 1.015 (ref 1.010–1.025)
UROBILINOGEN UA: 0.2 U/dL
pH, UA: 7 (ref 5.0–8.0)

## 2018-06-09 MED ORDER — CEPHALEXIN 500 MG PO CAPS
500.0000 mg | ORAL_CAPSULE | Freq: Two times a day (BID) | ORAL | 0 refills | Status: DC
Start: 2018-06-09 — End: 2018-07-14

## 2018-06-09 NOTE — Progress Notes (Signed)
BP 130/74 (BP Location: Left Arm, Patient Position: Sitting, Cuff Size: Normal)   Pulse 67   Temp 97.8 F (36.6 C) (Oral)   Ht 5\' 6"  (1.676 m)   Wt 171 lb 8 oz (77.8 kg)   SpO2 99%   BMI 27.68 kg/m    CC: UTI Subjective:    Patient ID: Amanda Estrada, female    DOB: 1961-03-05, 57 y.o.   MRN: [id number]  HPI: Amanda Estrada is a 57 y.o. female presenting on 06/09/2018 for Urinary Frequency (C/o frequent urination, urinary urgency and lower abd pressure. Sxs started this week. Had similar sxs about 3 wks ago. )   1 wk h/o urinary frequency, urgency, pressure, lower abd discomfort, incomplete emptying. Some R flank discomfort.  No fevers/chills, dysuria, hematuria, nausea. No bowel changes. She did have similar symptoms with UTI 3 months ago   Relevant past medical, surgical, family and social history reviewed and updated as indicated. Interim medical history since our last visit reviewed. Allergies and medications reviewed and updated. Outpatient Medications Prior to Visit  Medication Sig Dispense Refill  . albuterol (VENTOLIN HFA) 108 (90 Base) MCG/ACT inhaler Inhale 2 puffs into lungs every 6 hours as needed for wheezing. 18 g 5  . apixaban (ELIQUIS) 5 MG TABS tablet Take 1 tablet (5 mg total) by mouth 2 (two) times daily. 60 tablet 11  . cetirizine (ZYRTEC) 10 MG tablet Take 10 mg by mouth daily.    . cloNIDine (CATAPRES) 0.1 MG tablet Take 1 tablet (0.1 mg total) by mouth at bedtime. 90 tablet 1  . diltiazem (CARDIZEM CD) 240 MG 24 hr capsule TAKE 1 CAPSULE (240 MG TOTAL) BY MOUTH ONCE DAILY. 90 capsule 1  . ELIDEL 1 % cream Apply 1 application daily as needed topically (for allergies under eyes).   2  . fluticasone (FLONASE) 50 MCG/ACT nasal spray USE 1 SPRAY IN THE NOSE DAILY AS DIRECTED 16 g 11  . losartan (COZAAR) 100 MG tablet TAKE 1 TABLET (100 MG TOTAL) BY MOUTH DAILY. 90 tablet 1  . metoprolol succinate (TOPROL-XL) 25 MG 24 hr tablet Take 1 tablet (25 mg total)  by mouth daily. (Patient taking differently: Take 25 mg by mouth daily as needed (for AFIB). ) 90 tablet 3  . montelukast (SINGULAIR) 10 MG tablet TAKE 1 TABLET (10 MG TOTAL) BY MOUTH AT BEDTIME. 90 tablet 3  . omeprazole (PRILOSEC) 40 MG capsule Take 40 mg by mouth daily.    . rosuvastatin (CRESTOR) 20 MG tablet Take 1 tablet (20 mg total) by mouth daily. 90 tablet 1  . spironolactone (ALDACTONE) 50 MG tablet Take 1 tablet (50 mg total) by mouth daily. 90 tablet 3  . oxyCODONE-acetaminophen (PERCOCET) 10-325 MG tablet Take 0.5 tablets by mouth every 6 (six) hours as needed.   0  . sulfamethoxazole-trimethoprim (BACTRIM DS,SEPTRA DS) 800-160 MG tablet Take 1 tablet by mouth 2 (two) times daily. 14 tablet 0   No facility-administered medications prior to visit.      Per HPI unless specifically indicated in ROS section below Review of Systems     Objective:    BP 130/74 (BP Location: Left Arm, Patient Position: Sitting, Cuff Size: Normal)   Pulse 67   Temp 97.8 F (36.6 C) (Oral)   Ht 5\' 6"  (1.676 m)   Wt 171 lb 8 oz (77.8 kg)   SpO2 99%   BMI 27.68 kg/m   Wt Readings from Last 3 Encounters:  06/09/18 171 lb  8 oz (77.8 kg)  12/08/17 186 lb 3.2 oz (84.5 kg)  12/01/17 184 lb (83.5 kg)    Physical Exam  Constitutional: She appears well-developed and well-nourished. No distress.  Abdominal: Soft. Bowel sounds are normal. She exhibits no distension and no mass. There is no hepatosplenomegaly. There is tenderness (mild-mod) in the right lower quadrant, suprapubic area and left lower quadrant. There is no rebound, no guarding, no CVA tenderness and negative Murphy's sign. No hernia.  Psychiatric: She has a normal mood and affect.  Nursing note and vitals reviewed.  Results for orders placed or performed in visit on 06/09/18  POCT Urinalysis Dipstick (Automated)  Result Value Ref Range   Color, UA yellow    Clarity, UA clear    Glucose, UA Negative Negative   Bilirubin, UA negative      Ketones, UA negative    Spec Grav, UA 1.015 1.010 - 1.025   Blood, UA negative    pH, UA 7.0 5.0 - 8.0   Protein, UA Negative Negative   Urobilinogen, UA 0.2 0.2 or 1.0 E.U./dL   Nitrite, UA negative    Leukocytes, UA Small (1+) (A) Negative      Assessment & Plan:   Problem List Items Addressed This Visit    UTI (urinary tract infection) - Primary    Story and UA consistent with UTI - treat with 7d keflex course. Update if not improving with treatment.  UCx sent.       Relevant Medications   cephALEXin (KEFLEX) 500 MG capsule   Other Relevant Orders   Urine Culture   POCT Urinalysis Dipstick (Automated) (Completed)       Meds ordered this encounter  Medications  . cephALEXin (KEFLEX) 500 MG capsule    Sig: Take 1 capsule (500 mg total) by mouth 2 (two) times daily.    Dispense:  14 capsule    Refill:  0   Orders Placed This Encounter  Procedures  . Urine Culture  . POCT Urinalysis Dipstick (Automated)    Follow up plan: Return if symptoms worsen or fail to improve.  Ria Bush, MD

## 2018-06-09 NOTE — Patient Instructions (Signed)
I do think you have UTI - treat with keflex antibiotic sent to pharmacy Let us know if symptoms don't improve with treatment.   Urinary Tract Infection, Adult A urinary tract infection (UTI) is an infection of any part of the urinary tract. The urinary tract includes the:  Kidneys.  Ureters.  Bladder.  Urethra.  These organs make, store, and get rid of pee (urine) in the body. Follow these instructions at home:  Take over-the-counter and prescription medicines only as told by your doctor.  If you were prescribed an antibiotic medicine, take it as told by your doctor. Do not stop taking the antibiotic even if you start to feel better.  Avoid the following drinks: ? Alcohol. ? Caffeine. ? Tea. ? Carbonated drinks.  Drink enough fluid to keep your pee clear or pale yellow.  Keep all follow-up visits as told by your doctor. This is important.  Make sure to: ? Empty your bladder often and completely. Do not to hold pee for long periods of time. ? Empty your bladder before and after sex. ? Wipe from front to back after a bowel movement if you are female. Use each tissue one time when you wipe. Contact a doctor if:  You have back pain.  You have a fever.  You feel sick to your stomach (nauseous).  You throw up (vomit).  Your symptoms do not get better after 3 days.  Your symptoms go away and then come back. Get help right away if:  You have very bad back pain.  You have very bad lower belly (abdominal) pain.  You are throwing up and cannot keep down any medicines or water. This information is not intended to replace advice given to you by your health care provider. Make sure you discuss any questions you have with your health care provider. Document Released: 04/12/2008 Document Revised: 04/01/2016 Document Reviewed: 09/15/2015 Elsevier Interactive Patient Education  [employer].

## 2018-06-09 NOTE — Assessment & Plan Note (Addendum)
Story and UA consistent with UTI - treat with 7d keflex course. Update if not improving with treatment.  UCx sent.

## 2018-06-11 LAB — URINE CULTURE
MICRO NUMBER:: 90916318
SPECIMEN QUALITY:: ADEQUATE

## 2018-06-12 DIAGNOSIS — H2512 Age-related nuclear cataract, left eye: Secondary | ICD-10-CM | POA: Diagnosis not present

## 2018-06-12 DIAGNOSIS — H25812 Combined forms of age-related cataract, left eye: Secondary | ICD-10-CM | POA: Diagnosis not present

## 2018-06-15 ENCOUNTER — Other Ambulatory Visit: Payer: Self-pay | Admitting: Family Medicine

## 2018-06-20 DIAGNOSIS — M25672 Stiffness of left ankle, not elsewhere classified: Secondary | ICD-10-CM | POA: Diagnosis not present

## 2018-07-12 ENCOUNTER — Encounter: Payer: Self-pay | Admitting: Thoracic Surgery (Cardiothoracic Vascular Surgery)

## 2018-07-14 ENCOUNTER — Ambulatory Visit: Payer: BLUE CROSS/BLUE SHIELD | Admitting: Family Medicine

## 2018-07-14 ENCOUNTER — Encounter: Payer: Self-pay | Admitting: Family Medicine

## 2018-07-14 ENCOUNTER — Encounter: Payer: Self-pay | Admitting: *Deleted

## 2018-07-14 VITALS — BP 138/62 | HR 81 | Temp 98.2°F | Ht 66.0 in | Wt 173.2 lb

## 2018-07-14 DIAGNOSIS — Z8709 Personal history of other diseases of the respiratory system: Secondary | ICD-10-CM

## 2018-07-14 DIAGNOSIS — R053 Chronic cough: Secondary | ICD-10-CM

## 2018-07-14 DIAGNOSIS — R05 Cough: Secondary | ICD-10-CM | POA: Diagnosis not present

## 2018-07-14 DIAGNOSIS — Z86018 Personal history of other benign neoplasm: Secondary | ICD-10-CM

## 2018-07-14 MED ORDER — PANTOPRAZOLE SODIUM 40 MG PO TBEC: 40.0000 mg | DELAYED_RELEASE_TABLET | Freq: Every day | ORAL | 3 refills | Status: DC

## 2018-07-14 NOTE — Progress Notes (Signed)
   Subjective:    Patient ID: Amanda Estrada, female    DOB: 24-Jun-1961, 57 y.o.   MRN: [id number]  Cough  Pertinent negatives include no chest pain, fever, headaches or shortness of breath.     23 yea rold female with history of  intermittent hemoptysis, coughing, wheezing caused by a  Benign lung tumor removed in 11.2018 by Dr. Roxan Hockey . Plans was to repeat CT chest and bronchoscpy in 09/2017 Her cough had resolved 6 weeks after surgery.  Now in last  2 weeks..  Clear productive cough.. No blood in sputum. No fever. No itchy eyes ( has had eye surgery and currently on steroid eye drops) no runny nose. No wheeze, no SOB. Occ GERD on prilosec 40 mg daily.   Albuterol makes her cough more.  Using Singulair,xyzal at bedtime. Flonase occ.  Wt Readings from Last 3 Encounters:  07/14/18 173 lb 4 oz (78.6 kg)  06/09/18 171 lb 8 oz (77.8 kg)  12/08/17 186 lb 3.2 oz (84.5 kg)  Body mass index is 27.96 kg/m.   Review of Systems  Constitutional: Negative for fatigue and fever.  HENT: Negative for congestion.   Eyes: Negative for pain.  Respiratory: Positive for cough. Negative for shortness of breath.   Cardiovascular: Negative for chest pain, palpitations and leg swelling.  Gastrointestinal: Negative for abdominal pain.  Genitourinary: Negative for dysuria and vaginal bleeding.  Musculoskeletal: Negative for back pain.  Neurological: Negative for syncope, light-headedness and headaches.  Psychiatric/Behavioral: Negative for dysphoric mood.       Objective:   Physical Exam  Constitutional: Vital signs are normal. She appears well-developed and well-nourished. She is cooperative.  Non-toxic appearance. She does not appear ill. No distress.  HENT:  Head: Normocephalic.  Right Ear: Hearing, tympanic membrane, external ear and ear canal normal. Tympanic membrane is not erythematous, not retracted and not bulging.  Left Ear: Hearing, tympanic membrane, external ear and ear  canal normal. Tympanic membrane is not erythematous, not retracted and not bulging.  Nose: No mucosal edema or rhinorrhea. Right sinus exhibits no maxillary sinus tenderness and no frontal sinus tenderness. Left sinus exhibits no maxillary sinus tenderness and no frontal sinus tenderness.  Mouth/Throat: Uvula is midline, oropharynx is clear and moist and mucous membranes are normal.  Eyes: Pupils are equal, round, and reactive to light. Conjunctivae, EOM and lids are normal. Lids are everted and swept, no foreign bodies found.  Neck: Trachea normal and normal range of motion. Neck supple. Carotid bruit is not present. No thyroid mass and no thyromegaly present.  Cardiovascular: Normal rate, regular rhythm, S1 normal, S2 normal, normal heart sounds, intact distal pulses and normal pulses. Exam reveals no gallop and no friction rub.  No murmur heard. Pulmonary/Chest: Effort normal and breath sounds normal. No tachypnea. No respiratory distress. She has no decreased breath sounds. She has no wheezes. She has no rhonchi. She has no rales.  Abdominal: Soft. Normal appearance and bowel sounds are normal. There is no tenderness.  Neurological: She is alert.  Skin: Skin is warm, dry and intact. No rash noted.  Psychiatric: Her speech is normal and behavior is normal. Judgment and thought content normal. Her mood appears not anxious. Cognition and memory are normal. She does not exhibit a depressed mood.          Assessment & Plan:

## 2018-07-14 NOTE — Patient Instructions (Addendum)
Continue singulair and xyzal. Restart 2 sprays per nostril daily of flonase.  Stop prilosec .. change to pantoprazole.   I will send this note to Pulmonary for any further recommendations.

## 2018-07-14 NOTE — Telephone Encounter (Signed)
Ov first with me with all meds in hand

## 2018-07-14 NOTE — Telephone Encounter (Signed)
Patient states "My medical doctor wanted me to let you know that my coughing has started back recently. Last year I had a tumor removed from my lung and am supposed to be scheduled once a year for CT to make sure it doesn't come back. My CT will probably not be scheduled until November, but she says maybe I need to get it scheduled sooner.  MW please if you want CT sooner and or appt. Thanks.

## 2018-07-17 ENCOUNTER — Ambulatory Visit (AMBULATORY_SURGERY_CENTER): Payer: Self-pay

## 2018-07-17 ENCOUNTER — Other Ambulatory Visit: Payer: Self-pay | Admitting: Gastroenterology

## 2018-07-17 VITALS — Ht 66.0 in | Wt 174.2 lb

## 2018-07-17 DIAGNOSIS — Z800 Family history of malignant neoplasm of digestive organs: Secondary | ICD-10-CM

## 2018-07-17 MED ORDER — PLENVU 140 G PO SOLR: 1.0000 | ORAL | 0 refills | Status: DC

## 2018-07-17 NOTE — Progress Notes (Signed)
No egg or soy allergy known to patient  No issues with past sedation with any surgeries  or procedures, no intubation problems  No diet pills per patient No home 02 use per patient  No blood thinners per patient  Pt denies issues with constipation   A fib  two years, just on asa.  EMMI video sent to pt's e mail. Pt declined  Plenvu universal coupon given.

## 2018-07-18 NOTE — Telephone Encounter (Signed)
Plenvu not covered by patient's insurance.   Please try to determine alternate that is covered.

## 2018-07-24 NOTE — Telephone Encounter (Signed)
[facility] [place] [facility] [address]. Vladimir Faster was not covered. Also called patient and told her new prep was called in and that I would send new instructions by mail since her colon isn't until Oct.

## 2018-07-28 ENCOUNTER — Ambulatory Visit: Payer: BLUE CROSS/BLUE SHIELD | Admitting: Internal Medicine

## 2018-07-28 ENCOUNTER — Encounter: Payer: Self-pay | Admitting: Gastroenterology

## 2018-07-28 ENCOUNTER — Encounter: Payer: Self-pay | Admitting: Internal Medicine

## 2018-07-28 ENCOUNTER — Ambulatory Visit (INDEPENDENT_AMBULATORY_CARE_PROVIDER_SITE_OTHER)
Admission: RE | Admit: 2018-07-28 | Discharge: 2018-07-28 | Disposition: A | Discharge status: Home / Self Care | Payer: BLUE CROSS/BLUE SHIELD | Source: Ambulatory Visit | Attending: Internal Medicine | Admitting: Internal Medicine

## 2018-07-28 ENCOUNTER — Telehealth: Payer: Self-pay | Admitting: Internal Medicine

## 2018-07-28 VITALS — BP 150/60 | HR 80 | Ht 64.5 in | Wt 173.0 lb

## 2018-07-28 DIAGNOSIS — R918 Other nonspecific abnormal finding of lung field: Secondary | ICD-10-CM | POA: Diagnosis not present

## 2018-07-28 DIAGNOSIS — R05 Cough: Secondary | ICD-10-CM | POA: Diagnosis not present

## 2018-07-28 DIAGNOSIS — R053 Chronic cough: Secondary | ICD-10-CM

## 2018-07-28 MED ORDER — PREDNISONE 10 MG PO TABS: ORAL_TABLET | ORAL | 0 refills | Status: DC

## 2018-07-28 MED ORDER — TRAMADOL HCL 50 MG PO TABS: ORAL_TABLET | ORAL | 0 refills | Status: DC

## 2018-07-28 MED ORDER — PANTOPRAZOLE SODIUM 40 MG PO TBEC: DELAYED_RELEASE_TABLET | ORAL | 3 refills | Status: DC

## 2018-07-28 NOTE — Telephone Encounter (Signed)
Pharmacy wanted to make sure patient has instructions on how to take Prednisone. Instructions on AVS; nothing more needed at this time.

## 2018-07-28 NOTE — Patient Instructions (Signed)
Prednisone 10 mg take  4 each am x 2 days,   2 each am x 2 days,  1 each am x 2 days and stop   Change protonix to 40 mg Take 30- 60 min before your first and last meals of the day   Take delsym two tsp every 12 hours and supplement if needed with  tramadol 50 mg up to 1 every 4 hours to suppress the urge to cough. Swallowing water and/or using ice chips/non mint and menthol containing candies (such as lifesavers or sugarless jolly ranchers) are also effective.  You should rest your voice and avoid activities that you know make you cough.  Once you have eliminated the cough for 3 straight days try reducing the tramadol first,  then the delsym as tolerated.     GERD (REFLUX)  is an extremely common cause of respiratory symptoms just like yours , many times with no obvious heartburn at all.    It can be treated with medication, but also with lifestyle changes including elevation of the head of your bed (ideally with 6 inch  bed blocks),  Smoking cessation, avoidance of late meals, excessive alcohol, and avoid fatty foods, chocolate, peppermint, colas, red wine, and acidic juices such as orange juice.  NO MINT OR MENTHOL PRODUCTS SO NO COUGH DROPS   USE SUGARLESS CANDY INSTEAD (Jolley ranchers or Stover's or Life Savers) or even ice chips will also do - the key is to swallow to prevent all throat clearing. NO OIL BASED VITAMINS - use powdered substitutes.    Please remember to go to the  x-ray department downstairs in the basement  for your tests - we will call you with the results when they are available to determine when /if to repeat the bronchoscopy.

## 2018-07-28 NOTE — Progress Notes (Signed)
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 Pulmonary Consult    Referred by Dr. Eliezer Lofts. Pt c/o cough for the past year, worse x 2 wks- prod with bright red blood.  Cough is worse in the am's, she wakes up and sees blood on her pillow. She has also been having SOB with walking up stairs or at a fast pace. She is using an albuterol inhaler 2 x daily on average.   intermittent cough /wheeze x 1.5 years with nl spirometry during flare and low grade hemoptysis x one year s epistaxis  Feels "all better" only while on prednisone with baseline 5/7 days of the week has minimal symptoms and really Not limited by breathing from desired activities  But 2/7 days some wheeze/doe and cough productive of streaks of blood but nothing purulent Has gerd already on ppi s overt hb  W/u Fob 08/10/2017 > polypoid smooth mass obst LLL sup segment > atypical cells on cytology but bx of polypoid lesion= nl epithelium with benign features > excision by Roxan Hockey 09/19/17 >Video bronchoscopy with endobronchial tumor resection and laser ablation. benign bronchial papillary adenoma.      07/28/2018 acute extended ov/[facility] re:  Re-establish re cough ? Recurrent bronchial adenoma? Chief Complaint  Patient presents with  . Acute  Visit    Cough x 1 month- clear sputum.  Cough is worse in the mornings. She has noticed that talking can trigger the cough.     indolent onset of recurrent day greater than nocturnal cough around 1 month prior to office visit while being maintained on Singulair and Zyrtec with the addition of Protonix albuterol and Flonase making no difference so far and also switched from Zyrtec to Xyzal also with no difference so far.  Not limited by breathing from desired activities    The cough is worse with voice use and appears worse after she wakes up in the morning but the actual amount of mucus she coughs up is minimal and mucoid never bloody.  No obvious day to day or daytime variability or assoc purulent sputum or mucus plugs or hemoptysis or cp or chest tightness, subjective wheeze or overt sinus or hb symptoms.   Sleeps most nights  without nocturnal  or early am exacerbation  of respiratory  c/o's or need for noct saba. Also denies any obvious fluctuation of symptoms with weather or environmental changes or other aggravating or alleviating factors except as outlined above   No unusual exposure hx or h/o childhood pna/ asthma or knowledge of premature birth.  Current Allergies, Complete Past Medical History, Past Surgical History, Family History, and Social History were reviewed in [employer]  record.  ROS  The following are not active complaints unless bolded Hoarseness, sore throat, dysphagia, dental problems, itching, sneezing,  nasal congestion or discharge of excess mucus or purulent secretions, ear ache,   fever, chills, sweats, unintended wt loss or wt gain, classically pleuritic or exertional cp,  orthopnea pnd or arm/hand swelling  or leg swelling, presyncope, palpitations, abdominal pain, anorexia, nausea, vomiting, diarrhea  or change in bowel habits or change in bladder habits, change in stools or change in urine, dysuria, hematuria,  rash, arthralgias, visual  complaints, headache, numbness, weakness or ataxia or problems with walking or coordination,  change in mood or  memory.        Current Meds  Medication Sig  . albuterol (VENTOLIN HFA) 108 (90 Base) MCG/ACT inhaler Inhale 2 puffs into lungs every 6 hours as needed for wheezing.  Marland Kitchen aspirin 81 MG chewable tablet Chew by mouth daily.  . cetirizine (ZYRTEC) 10 MG tablet Take 10 mg by mouth daily.  . cloNIDine (CATAPRES) 0.1 MG tablet Take 1 tablet (0.1 mg total) by mouth at bedtime.  Marland Kitchen diltiazem (CARDIZEM CD) 240 MG 24 hr capsule TAKE 1 CAPSULE (240 MG TOTAL) BY MOUTH ONCE DAILY.  Marland Kitchen ELIDEL 1 % cream Apply 1 application daily as needed topically (for allergies under eyes).   . fluticasone (FLONASE) 50 MCG/ACT nasal spray USE 1 SPRAY IN THE NOSE DAILY AS DIRECTED  . levocetirizine (XYZAL) 5 MG tablet Take 5 mg by mouth every evening.  Marland Kitchen losartan (COZAAR) 100 MG tablet TAKE 1 TABLET (100 MG TOTAL) BY MOUTH DAILY.  . metoprolol succinate (TOPROL-XL) 25 MG 24 hr tablet Take 1 tablet (25 mg total) by mouth daily. (Patient taking differently: Take 25 mg by mouth daily as needed (for AFIB). )  . montelukast (SINGULAIR) 10 MG tablet TAKE 1 TABLET (10 MG TOTAL) BY MOUTH AT BEDTIME.  .       . PLENVU 140 g SOLR Take 1 kit by mouth as directed.  . rosuvastatin (CRESTOR) 20 MG tablet Take 1 tablet (20 mg total) by mouth daily.  Marland Kitchen spironolactone (ALDACTONE) 50 MG tablet Take 1 tablet (50 mg total) by mouth daily.  . traMADol (ULTRAM) 50 MG tablet One every 4 hours as needed for severe cough  . [  pantoprazole (PROTONIX) 40 MG tablet Take 1 tablet (40 mg total) by mouth daily.  .                        Objective:   Physical Exam    Amb wf nad    07/28/2018      173   08/04/17 188 lb 6.4 oz (85.5 kg)  08/02/17 190 lb 8 oz (86.4 kg)  03/04/17 199 lb 8 oz (90.5 kg)       Vital signs reviewed - Note on arrival 02 sats  99% on RA     HEENT: nl dentition, turbinates bilaterally, and oropharynx.  Nl external ear canals without cough reflex   NECK :  without JVD/Nodes/TM/ nl carotid upstrokes bilaterally   LUNGS: no acc muscle use,  Nl contour chest which is clear to A and P bilaterally with cough at end  insp   maneuvers   CV:  RRR  no s3 or murmur or increase in P2, and no edema   ABD:  soft and nontender with nl inspiratory excursion in the supine position. No bruits or organomegaly appreciated, bowel sounds nl  MS:  Nl gait/  ext warm without deformities, calf tenderness, cyanosis or clubbing No obvious joint restrictions   SKIN: warm and dry without lesions    NEURO:  alert, approp, nl sensorium with  no motor or cerebellar deficits apparent.       CXR PA and Lateral:   07/28/2018 :    I personally reviewed images and agree with radiology impression as follows:   Stable radiographic appearance of the chest since November 2018, negative aside from mild chronic increased interstitial markings.       Assessment:

## 2018-07-28 NOTE — Assessment & Plan Note (Addendum)
Fob 08/10/2017 > polypoid smooth mass obst LLL sup segment > atypical cells on cytology but bx of polypoid lesion= nl epithelium with benign features > excision by Roxan Hockey 09/19/17 >Video bronchoscopy with endobronchial tumor resection and laser ablation. benign bronchial papillary adenoma.    The best way to follow up such a small lesion and exclude it  from the possibility that is contributing to cough is probably direct bronchoscopy rather than repeat CT scan at this point especially if chest x-ray is completely normal showing no other obvious source for the cough .  The main risk of bronchoscopy was that it might aggravate at least in the short run uacs.  Discussed in detail all the  indications, usual  risks and alternatives  relative to the benefits with patient who agrees to proceed with w/u as outlined.      I had an extended discussion with the patient reviewing all relevant studies completed to date and  lasting 25 minutes of a 40  minute acute office  visit to re-establish  severe non-specific but potentially very serious refractory respiratory symptoms of uncertain and potentially multiple  etiologies.   Each maintenance medication was reviewed in detail including most importantly the difference between maintenance and prns and under what circumstances the prns are to be triggered using an action plan format that is not reflected in the computer generated alphabetically organized AVS.    Please see AVS for specific instructions unique to this office visit that I personally wrote and verbalized to the the pt in detail and then reviewed with pt  by my nurse highlighting any changes in therapy/plan of care  recommended at today's visit.

## 2018-07-28 NOTE — Assessment & Plan Note (Signed)
Cough on insp maneuvers and worse with voice use is not typical of an endobronchial process which would cause cough on expiration have nothing to do with her voice . So most likely diagnosis here is Upper airway cough syndrome (previously labeled PNDS),  is so named because it's frequently impossible to sort out how much is  CR/sinusitis with freq throat clearing (which can be related to primary GERD)   vs  causing  secondary (" extra esophageal")  GERD from wide swings in gastric pressure that occur with throat clearing, often  promoting self use of mint and menthol lozenges that reduce the lower esophageal sphincter tone and exacerbate the problem further in a cyclical fashion.   These are the same pts (now being labeled as having "irritable larynx syndrome" by some cough centers) who not infrequently have a history of having failed to tolerate ace inhibitors,  dry powder inhalers or biphosphonates or report having atypical/extraesophageal reflux symptoms that don't respond to standard doses of PPI  and are easily confused as having aecopd or asthma flares by even experienced allergists/ pulmonologists (myself included).    Of the three most common causes of  Sub-acute / recurrent or chronic cough, only one (GERD)  can actually contribute to/ trigger  the other two (asthma and post nasal drip syndrome)  and perpetuate the cylce of cough.  While not intuitively obvious, many patients with chronic low grade reflux do not cough until there is a primary insult that disturbs the protective epithelial barrier and exposes sensitive nerve endings.   This is typically viral but can due to PNDS and  either may apply here.   The point is that once this occurs, it is difficult to eliminate the cycle  using anything but a maximally effective acid suppression regimen at least in the short run, accompanied by an appropriate diet to address non acid GERD and control / eliminate the cough itself for at least 3 days with  tramadol.

## 2018-07-29 ENCOUNTER — Encounter: Payer: Self-pay | Admitting: Internal Medicine

## 2018-07-31 ENCOUNTER — Encounter: Payer: Self-pay | Admitting: Nurse Practitioner

## 2018-07-31 ENCOUNTER — Ambulatory Visit: Payer: BLUE CROSS/BLUE SHIELD | Admitting: Nurse Practitioner

## 2018-07-31 ENCOUNTER — Encounter: Payer: Self-pay | Admitting: General Surgery

## 2018-07-31 ENCOUNTER — Telehealth: Payer: Self-pay | Admitting: Internal Medicine

## 2018-07-31 ENCOUNTER — Encounter: Payer: BLUE CROSS/BLUE SHIELD | Admitting: Gastroenterology

## 2018-07-31 ENCOUNTER — Telehealth: Payer: Self-pay | Admitting: General Surgery

## 2018-07-31 DIAGNOSIS — R05 Cough: Secondary | ICD-10-CM | POA: Diagnosis not present

## 2018-07-31 DIAGNOSIS — R042 Hemoptysis: Secondary | ICD-10-CM | POA: Diagnosis not present

## 2018-07-31 DIAGNOSIS — R053 Chronic cough: Secondary | ICD-10-CM

## 2018-07-31 MED ORDER — SUCRALFATE 1 G PO TABS: 1.0000 g | ORAL_TABLET | Freq: Three times a day (TID) | ORAL | 0 refills | Status: DC

## 2018-07-31 NOTE — Progress Notes (Signed)
_0  ID: Amanda Estrada, female    DOB: Jul 11, 1961, 57 y.o.   MRN: [id number]  Chief Complaint  Patient presents with  . Cough    w/blood (dark purple in color) on Saturday    Referring provider: Jinny Sanders, MD  HPI 57 year old female never smoker with asthma and OSA followed by Dr. Melvyn Novas. 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  OV 07/31/18 - Acute cough  Patient presents with ongoing cough. She was seen by Dr. Melvyn Novas on 07/28/18 and was prescribed prednisone taper, ultram, delsym, and Protonix. She states that on Saturday (07/29/18) after taking the prednisone 40 mg she starting having severe reflux and cough. She had to sleep sitting up that night because her reflux was so severe. She also started coughing up blood. She had a low grade fever over the weekend. She denies any shortness of breath, chest pain, or edema. She states that today she has felt much better and did go to work this morning and plans to return to work after the office visit today. She has been compliant with delsym, protonix, and ultram.    Allergies  Allergen Reactions  . Amoxicillin-Pot Clavulanate Other (See Comments)    Diarrhea Has patient had a PCN reaction causing immediate rash, facial/tongue/throat swelling, SOB or lightheadedness with hypotension: No Has patient had a PCN reaction causing severe rash involving mucus membranes or skin necrosis: No Has patient had a PCN reaction that required hospitalization: No Has patient had a PCN reaction  occurring within the last 10 years: Yes--diarrhea ONLY If all of the above answers are "NO", then may proceed with Cephalosporin use.     Immunization History  Administered Date(s) Administered  . Td 01/04/2006  . Tdap 07/20/2016    Past Medical History:  Diagnosis Date  . Allergy   . Anxiety   . Asthma   . Cataract   . GERD (gastroesophageal reflux disease)   . Glaucoma   . History of echocardiogram    a. 11/2015: EF 55-60%, no RWMA, nl LV diastolic fxn, PASP nl  . Hypertension   . Obesity   . PAF (paroxysmal atrial fibrillation) ([place])    a. CHADS2VASc = 2 (HTN, sex category)  . Sinusitis 2011   Recurrent per Dr. Tami Ribas with [facility]    Tobacco History: Social History   Tobacco Use  Smoking Status Never Smoker  Smokeless Tobacco Never Used   Counseling given: Yes   Outpatient Encounter Medications as of 07/31/2018  Medication Sig  . albuterol (VENTOLIN HFA) 108 (90 Base) MCG/ACT inhaler Inhale 2 puffs into lungs every 6 hours as needed for wheezing.  Marland Kitchen aspirin 81 MG chewable tablet Chew by mouth daily.  . benzonatate (TESSALON) 200 MG capsule   . cetirizine (ZYRTEC) 10 MG tablet Take 10 mg by mouth daily.  . cloNIDine (CATAPRES) 0.1 MG tablet Take 1 tablet (0.1 mg total) by mouth at bedtime.  Marland Kitchen diltiazem (CARDIZEM CD) 240 MG 24 hr  capsule TAKE 1 CAPSULE (240 MG TOTAL) BY MOUTH ONCE DAILY.  Marland Kitchen ELIDEL 1 % cream Apply 1 application daily as needed topically (for allergies under eyes).   . fluticasone (FLONASE) 50 MCG/ACT nasal spray USE 1 SPRAY IN THE NOSE DAILY AS DIRECTED  . levocetirizine (XYZAL) 5 MG tablet Take 5 mg by mouth every evening.  Marland Kitchen losartan (COZAAR) 100 MG tablet TAKE 1 TABLET (100 MG TOTAL) BY MOUTH DAILY.  . metoprolol succinate (TOPROL-XL) 25 MG 24 hr tablet Take 1 tablet (25 mg total) by mouth daily. (Patient taking differently: Take 25 mg by mouth daily as needed (for AFIB). )  . montelukast (SINGULAIR) 10 MG tablet TAKE 1 TABLET (10 MG TOTAL)  BY MOUTH AT BEDTIME.  . pantoprazole (PROTONIX) 40 MG tablet Take 30- 60 min before your first and last meals of the day  . PLENVU 140 g SOLR Take 1 kit by mouth as directed.  . rosuvastatin (CRESTOR) 20 MG tablet Take 1 tablet (20 mg total) by mouth daily.  Marland Kitchen spironolactone (ALDACTONE) 50 MG tablet Take 1 tablet (50 mg total) by mouth daily.  Manus Gunning BOWEL PREP KIT 17.5-3.13-1.6 GM/177ML SOLN See admin instructions.  . traMADol (ULTRAM) 50 MG tablet One every 4 hours as needed for severe cough  . predniSONE (DELTASONE) 10 MG tablet Take as directed (Patient not taking: Reported on 07/31/2018)  . sucralfate (CARAFATE) 1 g tablet Take 1 tablet (1 g total) by mouth 4 (four) times daily -  with meals and at bedtime for 5 days.   No facility-administered encounter medications on file as of 07/31/2018.      Review of Systems  Review of Systems  Constitutional: Negative.   HENT: Negative.   Respiratory: Positive for cough. Negative for shortness of breath.   Cardiovascular: Negative.  Negative for chest pain, palpitations and leg swelling.  Gastrointestinal: Negative.  Negative for blood in stool.       Severe reflux  Allergic/Immunologic: Negative.   Neurological: Negative.   Psychiatric/Behavioral: Negative.        Physical Exam  BP (!) 154/74 (BP Location: Left Arm, Patient Position: Sitting, Cuff Size: Normal)   Pulse 71   Temp 97.7 F (36.5 C)   Ht 5' 4.5" (1.638 m)   Wt 172 lb 12.8 oz (78.4 kg)   SpO2 100%   BMI 29.20 kg/m   Wt Readings from Last 5 Encounters:  07/31/18 172 lb 12.8 oz (78.4 kg)  07/28/18 173 lb (78.5 kg)  07/17/18 174 lb 3.2 oz (79 kg)  07/14/18 173 lb 4 oz (78.6 kg)  06/09/18 171 lb 8 oz (77.8 kg)     Physical Exam  Constitutional: She is oriented to person, place, and time. She appears well-developed and well-nourished. No distress.  Cardiovascular: Normal rate and regular rhythm.  Pulmonary/Chest: Effort normal and breath sounds normal. No  respiratory distress. She has no wheezes. She has no rales.  Musculoskeletal: She exhibits no edema.  Neurological: She is alert and oriented to person, place, and time.  Psychiatric: She has a normal mood and affect.  Nursing note and vitals reviewed.   Imaging: Dg Chest 2 View  Result Date: 07/28/2018 CLINICAL DATA:  57 year old female with productive cough for 1 month. Left lower lobe lung mass. EXAM: CHEST - 2 VIEW COMPARISON:  Chest radiographs 09/19/2017 and earlier. FINDINGS: Lung volumes and mediastinal contours remain normal. Visualized tracheal air column is within normal limits. No pneumothorax, pulmonary edema, pleural effusion or confluent pulmonary opacity.  Stable mild increased pulmonary interstitium. Prior cervical ACDF. No acute osseous abnormality identified. Negative visible bowel gas pattern. IMPRESSION: Stable radiographic appearance of the chest since November 2018, negative aside from mild chronic increased interstitial markings. Electronically Signed   By: Genevie Ann M.D.   On: 07/28/2018 16:36     Assessment & Plan:   Chronic cough Patient instructions: Symptoms seem to be reflux related Strict bland diet - hand out given Avoid foods that increase reflux - hand out given Will order carafate Please continue Protonix as directed  Continue delsym and ultram for cough May stop prednisone for the next couple of days Please call us with an update in a couple of days and for instructions on prednisone Follow up with Dr. Melvyn Novas in around 1 week Please go to the ED if symptoms worsen  Hemoptysis Patient Instructions    Food Choices for Gastroesophageal Reflux Disease, Adult When you have gastroesophageal reflux disease (GERD), the foods you eat and your eating habits are very important. Choosing the right foods can help ease your discomfort. What guidelines do I need to follow?  Choose fruits, vegetables, whole grains, and low-fat dairy products.  Choose low-fat  meat, fish, and poultry.  Limit fats such as oils, salad dressings, butter, nuts, and avocado.  Keep a food diary. This helps you identify foods that cause symptoms.  Avoid foods that cause symptoms. These may be different for everyone.  Eat small meals often instead of 3 large meals a day.  Eat your meals slowly, in a place where you are relaxed.  Limit fried foods.  Cook foods using methods other than frying.  Avoid drinking alcohol.  Avoid drinking large amounts of liquids with your meals.  Avoid bending over or lying down until 2-3 hours after eating. What foods are not recommended? These are some foods and drinks that may make your symptoms worse: Vegetables Tomatoes. Tomato juice. Tomato and spaghetti sauce. Chili peppers. Onion and garlic. Horseradish. Fruits Oranges, grapefruit, and lemon (fruit and juice). Meats High-fat meats, fish, and poultry. This includes hot dogs, ribs, ham, sausage, salami, and bacon. Dairy Whole milk and chocolate milk. Sour cream. Cream. Butter. Ice cream. Cream cheese. Drinks Coffee and tea. Bubbly (carbonated) drinks or energy drinks. Condiments Hot sauce. Barbecue sauce. Sweets/Desserts Chocolate and cocoa. Donuts. Peppermint and spearmint. Fats and Oils High-fat foods. This includes [place] fries and potato chips. Other Vinegar. Strong spices. This includes black pepper, white pepper, red pepper, cayenne, curry powder, cloves, ginger, and chili powder. The items listed above may not be a complete list of foods and drinks to avoid. Contact your dietitian for more information. This information is not intended to replace advice given to you by your health care provider. Make sure you discuss any questions you have with your health care provider. Document Released: 04/25/2012 Document Revised: 04/01/2016 Document Reviewed: 08/29/2013 Elsevier Interactive Patient Education  2017 [place] Diet A bland diet consists of foods  that do not have a lot of fat or fiber. Foods without fat or fiber are easier for the body to digest. They are also less likely to irritate your mouth, throat, stomach, and other parts of your gastrointestinal tract. A bland diet is sometimes called a BRAT diet. What is my plan? Your health care provider or dietitian may recommend specific changes to your diet to prevent and treat your symptoms, such as:  Eating small meals often.  Cooking food until it is soft enough to chew easily.  Chewing  your food well.  Drinking fluids slowly.  Not eating foods that are very spicy, sour, or fatty.  Not eating citrus fruits, such as oranges and grapefruit.  What do I need to know about this diet?  Eat a variety of foods from the bland diet food list.  Do not follow a bland diet longer than you have to.  Ask your health care provider whether you should take vitamins. What foods can I eat? Grains  Hot cereals, such as cream of wheat. Bread, crackers, or tortillas made from refined white flour. Rice. Vegetables Canned or cooked vegetables. Mashed or boiled potatoes. Fruits Bananas. Applesauce. Other types of cooked or canned fruit with the skin and seeds removed, such as canned peaches or pears. Meats and Other Protein Sources Scrambled eggs. Creamy peanut butter or other nut butters. Lean, well-cooked meats, such as chicken or fish. Tofu. Soups or broths. Dairy Low-fat dairy products, such as milk, cottage cheese, or yogurt. Beverages Water. Herbal tea. Apple juice. Sweets and Desserts Pudding. Custard. Fruit gelatin. Ice cream. Fats and Oils Mild salad dressings. Canola or olive oil. The items listed above may not be a complete list of allowed foods or beverages. Contact your dietitian for more options. What foods are not recommended? Foods and ingredients that are often not recommended include:  Spicy foods, such as hot sauce or salsa.  Fried foods.  Sour foods, such as pickled  or fermented foods.  Raw vegetables or fruits, especially citrus or berries.  Caffeinated drinks.  Alcohol.  Strongly flavored seasonings or condiments.  The items listed above may not be a complete list of foods and beverages that are not allowed. Contact your dietitian for more information. This information is not intended to replace advice given to you by your health care provider. Make sure you discuss any questions you have with your health care provider. Document Released: 02/16/2016 Document Revised: 04/01/2016 Document Reviewed: 11/06/2014 Elsevier Interactive Patient Education  2018 [employer].  Patient instructions: Symptoms seem to be reflux related Strict bland diet - hand out given Avoid foods that increase reflux - hand out given Will order carafate Please continue Protonix as directed  Continue delsym and ultram for cough May stop prednisone for the next couple of days Please call us with an update in a couple of days and for instructions on prednisone Follow up with Dr. Melvyn Novas in around 1 week Please go to the ED if symptoms worsen  Note: Discussed with patient that bronchoscopy might be indicated if symptoms persist        Fenton Foy, NP 07/31/2018

## 2018-07-31 NOTE — Patient Instructions (Addendum)
Food Choices for Gastroesophageal Reflux Disease, Adult When you have gastroesophageal reflux disease (GERD), the foods you eat and your eating habits are very important. Choosing the right foods can help ease your discomfort. What guidelines do I need to follow?  Choose fruits, vegetables, whole grains, and low-fat dairy products.  Choose low-fat meat, fish, and poultry.  Limit fats such as oils, salad dressings, butter, nuts, and avocado.  Keep a food diary. This helps you identify foods that cause symptoms.  Avoid foods that cause symptoms. These may be different for everyone.  Eat small meals often instead of 3 large meals a day.  Eat your meals slowly, in a place where you are relaxed.  Limit fried foods.  Cook foods using methods other than frying.  Avoid drinking alcohol.  Avoid drinking large amounts of liquids with your meals.  Avoid bending over or lying down until 2-3 hours after eating. What foods are not recommended? These are some foods and drinks that may make your symptoms worse: Vegetables Tomatoes. Tomato juice. Tomato and spaghetti sauce. Chili peppers. Onion and garlic. Horseradish. Fruits Oranges, grapefruit, and lemon (fruit and juice). Meats High-fat meats, fish, and poultry. This includes hot dogs, ribs, ham, sausage, salami, and bacon. Dairy Whole milk and chocolate milk. Sour cream. Cream. Butter. Ice cream. Cream cheese. Drinks Coffee and tea. Bubbly (carbonated) drinks or energy drinks. Condiments Hot sauce. Barbecue sauce. Sweets/Desserts Chocolate and cocoa. Donuts. Peppermint and spearmint. Fats and Oils High-fat foods. This includes [place] fries and potato chips. Other Vinegar. Strong spices. This includes black pepper, white pepper, red pepper, cayenne, curry powder, cloves, ginger, and chili powder. The items listed above may not be a complete list of foods and drinks to avoid. Contact your dietitian for more information. This  information is not intended to replace advice given to you by your health care provider. Make sure you discuss any questions you have with your health care provider. Document Released: 04/25/2012 Document Revised: 04/01/2016 Document Reviewed: 08/29/2013 Elsevier Interactive Patient Education  2017 [place] Diet A bland diet consists of foods that do not have a lot of fat or fiber. Foods without fat or fiber are easier for the body to digest. They are also less likely to irritate your mouth, throat, stomach, and other parts of your gastrointestinal tract. A bland diet is sometimes called a BRAT diet. What is my plan? Your health care provider or dietitian may recommend specific changes to your diet to prevent and treat your symptoms, such as:  Eating small meals often.  Cooking food until it is soft enough to chew easily.  Chewing your food well.  Drinking fluids slowly.  Not eating foods that are very spicy, sour, or fatty.  Not eating citrus fruits, such as oranges and grapefruit.  What do I need to know about this diet?  Eat a variety of foods from the bland diet food list.  Do not follow a bland diet longer than you have to.  Ask your health care provider whether you should take vitamins. What foods can I eat? Grains  Hot cereals, such as cream of wheat. Bread, crackers, or tortillas made from refined white flour. Rice. Vegetables Canned or cooked vegetables. Mashed or boiled potatoes. Fruits Bananas. Applesauce. Other types of cooked or canned fruit with the skin and seeds removed, such as canned peaches or pears. Meats and Other Protein Sources Scrambled eggs. Creamy peanut butter or other nut butters. Lean, well-cooked meats, such as chicken or  fish. Tofu. Soups or broths. Dairy Low-fat dairy products, such as milk, cottage cheese, or yogurt. Beverages Water. Herbal tea. Apple juice. Sweets and Desserts Pudding. Custard. Fruit gelatin. Ice cream. Fats and  Oils Mild salad dressings. Canola or olive oil. The items listed above may not be a complete list of allowed foods or beverages. Contact your dietitian for more options. What foods are not recommended? Foods and ingredients that are often not recommended include:  Spicy foods, such as hot sauce or salsa.  Fried foods.  Sour foods, such as pickled or fermented foods.  Raw vegetables or fruits, especially citrus or berries.  Caffeinated drinks.  Alcohol.  Strongly flavored seasonings or condiments.  The items listed above may not be a complete list of foods and beverages that are not allowed. Contact your dietitian for more information. This information is not intended to replace advice given to you by your health care provider. Make sure you discuss any questions you have with your health care provider. Document Released: 02/16/2016 Document Revised: 04/01/2016 Document Reviewed: 11/06/2014 Elsevier Interactive Patient Education  2018 [employer].  Patient instructions: Symptoms seem to be reflux related Strict bland diet - hand out given Avoid foods that increase reflux - hand out given Will order carafate Please continue Protonix as directed  Continue delsym and ultram for cough May stop prednisone for the next couple of days Please call us with an update in a couple of days and for instructions on prednisone Follow up with Dr. Melvyn Novas in around 1 week Please go to the ED if symptoms worsen

## 2018-07-31 NOTE — Telephone Encounter (Signed)
Please see 07/31/18 telephone encounter.

## 2018-07-31 NOTE — Telephone Encounter (Signed)
   07/30/18 3:25 PM  I took the Prednisone 4 tablets first day and later that afternoon I got chills, nausea, fever, acid reflux and hurting in my chest. I didn't take it the next day. The 2nd day after being sick all night. I started coughing up dark blood in my mucus.  I received 100 prednisone, but my prescription you gave me was for 4x2 days and 2x2 days and 1x2 days. What do I do with the others or is my prescription wrong? Also, am I supposed to take Tramadol for cough? It's for pain?    Due to these to Messages I have called the patient and advised her that she needs appointment this morning it has been made for 10:45 with NP

## 2018-07-31 NOTE — Assessment & Plan Note (Signed)
Patient instructions: Symptoms seem to be reflux related Strict bland diet - hand out given Avoid foods that increase reflux - hand out given Will order carafate Please continue Protonix as directed  Continue delsym and ultram for cough May stop prednisone for the next couple of days Please call us with an update in a couple of days and for instructions on prednisone Follow up with Dr. Melvyn Novas in around 1 week Please go to the ED if symptoms worsen

## 2018-07-31 NOTE — Telephone Encounter (Signed)
Advised her she will be called on Wednesday to check on her.

## 2018-07-31 NOTE — Assessment & Plan Note (Signed)
Patient Instructions    Food Choices for Gastroesophageal Reflux Disease, Adult When you have gastroesophageal reflux disease (GERD), the foods you eat and your eating habits are very important. Choosing the right foods can help ease your discomfort. What guidelines do I need to follow?  Choose fruits, vegetables, whole grains, and low-fat dairy products.  Choose low-fat meat, fish, and poultry.  Limit fats such as oils, salad dressings, butter, nuts, and avocado.  Keep a food diary. This helps you identify foods that cause symptoms.  Avoid foods that cause symptoms. These may be different for everyone.  Eat small meals often instead of 3 large meals a day.  Eat your meals slowly, in a place where you are relaxed.  Limit fried foods.  Cook foods using methods other than frying.  Avoid drinking alcohol.  Avoid drinking large amounts of liquids with your meals.  Avoid bending over or lying down until 2-3 hours after eating. What foods are not recommended? These are some foods and drinks that may make your symptoms worse: Vegetables Tomatoes. Tomato juice. Tomato and spaghetti sauce. Chili peppers. Onion and garlic. Horseradish. Fruits Oranges, grapefruit, and lemon (fruit and juice). Meats High-fat meats, fish, and poultry. This includes hot dogs, ribs, ham, sausage, salami, and bacon. Dairy Whole milk and chocolate milk. Sour cream. Cream. Butter. Ice cream. Cream cheese. Drinks Coffee and tea. Bubbly (carbonated) drinks or energy drinks. Condiments Hot sauce. Barbecue sauce. Sweets/Desserts Chocolate and cocoa. Donuts. Peppermint and spearmint. Fats and Oils High-fat foods. This includes [place] fries and potato chips. Other Vinegar. Strong spices. This includes black pepper, white pepper, red pepper, cayenne, curry powder, cloves, ginger, and chili powder. The items listed above may not be a complete list of foods and drinks to avoid. Contact your dietitian for more  information. This information is not intended to replace advice given to you by your health care provider. Make sure you discuss any questions you have with your health care provider. Document Released: 04/25/2012 Document Revised: 04/01/2016 Document Reviewed: 08/29/2013 Elsevier Interactive Patient Education  2017 [place] Diet A bland diet consists of foods that do not have a lot of fat or fiber. Foods without fat or fiber are easier for the body to digest. They are also less likely to irritate your mouth, throat, stomach, and other parts of your gastrointestinal tract. A bland diet is sometimes called a BRAT diet. What is my plan? Your health care provider or dietitian may recommend specific changes to your diet to prevent and treat your symptoms, such as:  Eating small meals often.  Cooking food until it is soft enough to chew easily.  Chewing your food well.  Drinking fluids slowly.  Not eating foods that are very spicy, sour, or fatty.  Not eating citrus fruits, such as oranges and grapefruit.  What do I need to know about this diet?  Eat a variety of foods from the bland diet food list.  Do not follow a bland diet longer than you have to.  Ask your health care provider whether you should take vitamins. What foods can I eat? Grains  Hot cereals, such as cream of wheat. Bread, crackers, or tortillas made from refined white flour. Rice. Vegetables Canned or cooked vegetables. Mashed or boiled potatoes. Fruits Bananas. Applesauce. Other types of cooked or canned fruit with the skin and seeds removed, such as canned peaches or pears. Meats and Other Protein Sources Scrambled eggs. Creamy peanut butter or other nut butters. Lean, well-cooked meats,  such as chicken or fish. Tofu. Soups or broths. Dairy Low-fat dairy products, such as milk, cottage cheese, or yogurt. Beverages Water. Herbal tea. Apple juice. Sweets and Desserts Pudding. Custard. Fruit gelatin.  Ice cream. Fats and Oils Mild salad dressings. Canola or olive oil. The items listed above may not be a complete list of allowed foods or beverages. Contact your dietitian for more options. What foods are not recommended? Foods and ingredients that are often not recommended include:  Spicy foods, such as hot sauce or salsa.  Fried foods.  Sour foods, such as pickled or fermented foods.  Raw vegetables or fruits, especially citrus or berries.  Caffeinated drinks.  Alcohol.  Strongly flavored seasonings or condiments.  The items listed above may not be a complete list of foods and beverages that are not allowed. Contact your dietitian for more information. This information is not intended to replace advice given to you by your health care provider. Make sure you discuss any questions you have with your health care provider. Document Released: 02/16/2016 Document Revised: 04/01/2016 Document Reviewed: 11/06/2014 Elsevier Interactive Patient Education  2018 [employer].  Patient instructions: Symptoms seem to be reflux related Strict bland diet - hand out given Avoid foods that increase reflux - hand out given Will order carafate Please continue Protonix as directed  Continue delsym and ultram for cough May stop prednisone for the next couple of days Please call us with an update in a couple of days and for instructions on prednisone Follow up with Dr. Melvyn Novas in around 1 week Please go to the ED if symptoms worsen  Note: Discussed with patient that bronchoscopy might be indicated if symptoms persist

## 2018-08-02 ENCOUNTER — Telehealth: Payer: Self-pay | Admitting: Internal Medicine

## 2018-08-02 NOTE — Telephone Encounter (Signed)
Called the patient and she stated did have a "rough" night last night, but just started taking the medicine this morning and noticed she is feeling better, did not see any blood in her congestion this morning. Reminded patient of the appointment on 08/07/18 with Dr. Melvyn Novas and to let him know if there are any changes during visit.

## 2018-08-02 NOTE — Telephone Encounter (Signed)
Pt is wanting to know what scan Dr. Melvyn Novas mentioned her having.  Pt states if it is a CT, she could do that on 08/04/18.  MW please advise. Thanks

## 2018-08-02 NOTE — Assessment & Plan Note (Signed)
Recurrent.  Will try treatment of allergies and GERd. If not improving return to pulmonary for likely repeat early CXR/chest CT/ bronch.

## 2018-08-02 NOTE — Progress Notes (Signed)
Chart and office note reviewed in detail  > agree with a/p as outlined    Discussed with pt, wants to put off fob for now

## 2018-08-03 NOTE — Telephone Encounter (Signed)
Patient is aware of these rec's nothing further needed at this time.

## 2018-08-03 NOTE — Telephone Encounter (Signed)
Yes, finish it up

## 2018-08-03 NOTE — Telephone Encounter (Signed)
Pt is aware of below message and voiced her understanding.  Pt states she was prescribed Sucralfate 1g at 07/31/18 OV with Tonya.  Pt is wanting to know if she should continue this medication.  MW please advise. Thanks  07/31/18 AVS: Patient instructions: Symptoms seem to be reflux related Strict bland diet - hand out given Avoid foods that increase reflux - hand out given Will order carafate Please continue Protonix as directed  Continue delsym and ultram for cough May stop prednisone for the next couple of days Please call us with an update in a couple of days and for instructions on prednisone Follow up with Dr. Melvyn Novas in around 1 week Please go to the ED if symptoms worsen           After Visit Summary (Printed 07/31/2018)  Communications

## 2018-08-03 NOTE — Telephone Encounter (Signed)
I said bronchoscopy, which is much more accurate than a scan for looking at the airways.  She would need to take a morning or an afternoon off to do it, and only needed if the cough persists despite following the other directions as per avs

## 2018-08-07 ENCOUNTER — Encounter: Payer: Self-pay | Admitting: Internal Medicine

## 2018-08-07 ENCOUNTER — Ambulatory Visit: Payer: BLUE CROSS/BLUE SHIELD | Admitting: Internal Medicine

## 2018-08-07 ENCOUNTER — Other Ambulatory Visit: Payer: Self-pay | Admitting: *Deleted

## 2018-08-07 VITALS — BP 124/72 | HR 73 | Ht 64.5 in | Wt 172.0 lb

## 2018-08-07 DIAGNOSIS — C3490 Malignant neoplasm of unspecified part of unspecified bronchus or lung: Secondary | ICD-10-CM

## 2018-08-07 DIAGNOSIS — R053 Chronic cough: Secondary | ICD-10-CM

## 2018-08-07 DIAGNOSIS — R059 Cough, unspecified: Secondary | ICD-10-CM

## 2018-08-07 DIAGNOSIS — R05 Cough: Secondary | ICD-10-CM

## 2018-08-07 DIAGNOSIS — I10 Essential (primary) hypertension: Secondary | ICD-10-CM | POA: Diagnosis not present

## 2018-08-07 MED ORDER — METHYLPREDNISOLONE ACETATE 80 MG/ML IJ SUSP
120.0000 mg | Freq: Once | INTRAMUSCULAR | Status: AC
  Administered 2018-08-07: 120 mg via INTRAMUSCULAR

## 2018-08-07 NOTE — Patient Instructions (Addendum)
depomedrol 120 mg IM today to see if that helps your cough   Stop toprol, losartan and singulair and tessalon   Start bisoprolol 5 mg each am   Come to outpatient registration at [facility] (behind the ER) at 715 am Wed 08/16/18 with nothing to eat or drink after midnight Tuesday for Bronchoscopy - ok to take your blood pressure meds with water if you usually take them in am Will need someone to drive you home afterwards

## 2018-08-07 NOTE — Progress Notes (Signed)
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 Pulmonary Consult    Referred by Dr. Eliezer Estrada. Pt c/o cough for the past year, worse x 2 wks- prod with bright red blood.  Cough is worse in the am's, she wakes up and sees blood on her pillow. She has also been having SOB with walking up stairs or at a fast pace. She is using an albuterol inhaler 2 x daily on average.   intermittent cough /wheeze x 1.5 years with nl spirometry during flare and low grade hemoptysis x one year s epistaxis  Feels "all better" only while on prednisone with baseline 5/7 days of the week has minimal symptoms and really Not limited by breathing from desired activities  But 2/7 days some wheeze/doe and cough productive of streaks of blood but nothing purulent Has gerd already on ppi s overt hb  W/u Fob 08/10/2017 > polypoid smooth mass obst LLL sup segment > atypical cells on cytology but bx of polypoid lesion= nl epithelium with benign features > excision by Amanda Estrada 09/19/17 >Video bronchoscopy with endobronchial tumor resection and laser ablation. benign bronchial papillary adenoma.      07/28/2018 acute extended ov/Amanda Estrada re:  Re-establish re cough ? Recurrent bronchial adenoma? Chief Complaint  Patient presents with  .  Acute Visit    Cough x 1 month- clear sputum.  Cough is worse in the mornings. She has noticed that talking can trigger the cough.     indolent onset of recurrent day greater than nocturnal cough around 1 month prior to office visit while being maintained on Singulair and Zyrtec with the addition of Protonix albuterol and Flonase making no difference so far and also switched from Zyrtec to Xyzal also with no difference so far. rec Prednisone 10 mg take  4 each am x 2 days,   2 each am x 2 days,  1 each am x 2 days and stop  Change protonix to 40 mg Take 30- 60 min before your first and last meals of the day  Take delsym two tsp every 12 hours and supplement if needed with  tramadol 50 mg up to 1 every 4 hours . Once you have eliminated the cough for 3 straight days try reducing the tramadol first,  then the delsym as tolerated.   GERD (REFLUX)  is an extremely common cause of respiratory symptoms just like yours , many times with no obvious heartburn at all.     07/31/18  NP rec Symptoms seem to be reflux related Strict bland diet - hand out given Avoid foods that increase reflux - hand out given Will order carafate Please continue Protonix as directed  Continue delsym and ultram for cough May stop prednisone for the next couple of days Please call us with an update in a couple of days and for instructions on prednisone Follow up with Dr. Melvyn Estrada in around 1 week   08/07/2018  f/u ov/Amanda Estrada re: cough since late aug 2019  Chief Complaint  Patient presents with  . Follow-up    She is coughing with clear sputum. She has not had any more hemoptysis since the last visit.    Dyspnea:  no Cough: wakes up coughing around 4 am but just clear mucus now x maybe 1 tsp  Sleeping: ok until 4 am   SABA use: not helping 02: none    Also overt hb despite  ppi bid and added  carafate some better  / cough worse with voice use also   No obvious day to day or daytime variability or assoc excess/ purulent  sputum or mucus plugs or hemoptysis or cp or chest tightness, subjective wheeze or overt sinus or hb symptoms.     Also denies any obvious fluctuation of symptoms with weather or environmental changes or other aggravating or alleviating factors except as outlined above   No unusual exposure hx or h/o childhood pna/ asthma or knowledge of premature birth.  Current Allergies, Complete Past Medical History, Past Surgical History, Family History, and Social History were reviewed in [employer] record.  ROS  The following are not active complaints unless bolded Hoarseness, sore throat, dysphagia, dental problems, itching, sneezing,  nasal congestion or discharge of excess mucus or purulent secretions, ear ache,   fever, chills, sweats, unintended wt loss or wt gain, classically pleuritic or exertional cp,  orthopnea pnd or arm/hand swelling  or leg swelling, presyncope, palpitations, abdominal pain, anorexia, nausea, vomiting, diarrhea  or change in bowel habits or change in bladder habits, change in stools or change in urine, dysuria, hematuria,  rash, arthralgias, visual complaints, headache, numbness, weakness or ataxia or problems with walking or coordination,  change in mood or  memory.        Current Meds  Medication Sig  . albuterol (VENTOLIN HFA) 108 (90 Base) MCG/ACT inhaler Inhale 2 puffs into lungs every 6 hours as needed for wheezing.  Marland Kitchen aspirin 81 MG chewable tablet Chew by mouth daily.  . cloNIDine (CATAPRES) 0.1 MG tablet Take 1 tablet (0.1 mg total) by mouth at bedtime.  Marland Kitchen diltiazem (CARDIZEM CD) 240 MG 24 hr capsule TAKE 1 CAPSULE (240 MG TOTAL) BY MOUTH ONCE DAILY.  Marland Kitchen ELIDEL 1 % cream Apply 1 application daily as needed topically (for allergies under eyes).   . fluticasone (FLONASE) 50 MCG/ACT nasal spray USE 1 SPRAY IN THE NOSE DAILY AS DIRECTED  . levocetirizine (XYZAL) 5 MG tablet Take 5 mg by mouth every evening.  . pantoprazole (PROTONIX) 40 MG tablet  Take 30- 60 min before your first and last meals of the day  . PLENVU 140 g SOLR Take 1 kit by mouth as directed.  . rosuvastatin (CRESTOR) 20 MG tablet Take 1 tablet (20 mg total) by mouth daily.  Marland Kitchen spironolactone (ALDACTONE) 50 MG tablet Take 1 tablet (50 mg total) by mouth daily.  Manus Gunning BOWEL PREP KIT 17.5-3.13-1.6 GM/177ML SOLN See admin instructions.  . traMADol (ULTRAM) 50 MG tablet One every 4 hours as needed for severe cough  . [  benzonatate (TESSALON) 200 MG capsule      Take 10 mg by mouth daily.  . [ ]  losartan (  COZAAR) 100 MG tablet TAKE 1 TABLET (100 MG TOTAL) BY MOUTH DAILY.  Marland Kitchen  ] metoprolol succinate (TOPROL-XL) 25 MG 24 hr tablet Take 1 tablet (25 mg total) by mouth daily. (Patient taking differently: Take 25 mg by mouth daily as needed (for AFIB). )  . [ ]  montelukast (SINGULAIR) 10 MG tablet TAKE 1 TABLET (10 MG TOTAL) BY MOUTH AT BEDTIME.                            Objective:   Physical Exam    Amb wf nad with harsh upper airway sounding cough on insp    08/07/2018       172   07/28/2018      173   08/04/17 188 lb 6.4 oz (85.5 kg)  08/02/17 190 lb 8 oz (86.4 kg)  03/04/17 199 lb 8 oz (90.5 kg)        Vital signs reviewed - Note on arrival 02 sats  95% on RA     HEENT: nl dentition, turbinates bilaterally, and oropharynx. Nl external ear canals without cough reflex   NECK :  without JVD/Nodes/TM/ nl carotid upstrokes bilaterally   LUNGS: no acc muscle use,  Nl contour chest which is clear to A and P bilaterally without cough on exp maneuvers   CV:  RRR  no s3 or murmur or increase in P2, and no edema   ABD:  soft and nontender with nl inspiratory excursion in the supine position. No bruits or organomegaly appreciated, bowel sounds nl  MS:  Nl gait/ ext warm without deformities, calf tenderness, cyanosis or clubbing No obvious joint restrictions   SKIN: warm and dry without lesions    NEURO:  alert, approp, nl sensorium with  no motor  or cerebellar deficits apparent.                 Assessment:

## 2018-08-08 ENCOUNTER — Telehealth: Payer: Self-pay | Admitting: Internal Medicine

## 2018-08-08 MED ORDER — BISOPROLOL FUMARATE 5 MG PO TABS: 5.0000 mg | ORAL_TABLET | Freq: Every day | ORAL | 5 refills | Status: DC

## 2018-08-08 NOTE — Telephone Encounter (Signed)
Called and spoke to patient. Explained to her that depomedrol was the medication given in office and we would call in the bisoprolol per Dr. Morrison Old instructions on AVS 08/07/18  Nothing further

## 2018-08-09 ENCOUNTER — Encounter: Payer: Self-pay | Admitting: Internal Medicine

## 2018-08-09 NOTE — Assessment & Plan Note (Signed)
Onset around Ag 20 2019 worse with voice use and on insp typical of Upper airway cough syndrome (previously labeled PNDS),  is so named because it's frequently impossible to sort out how much is  CR/sinusitis with freq throat clearing (which can be related to primary GERD)   vs  causing  secondary (" extra esophageal")  GERD from wide swings in gastric pressure that occur with throat clearing, often  promoting self use of mint and menthol lozenges that reduce the lower esophageal sphincter tone and exacerbate the problem further in a cyclical fashion.   These are the same pts (now being labeled as having "irritable larynx syndrome" by some cough centers) who not infrequently have a history of having failed to tolerate ace inhibitors,  dry powder inhalers or biphosphonates or report having atypical/extraesophageal reflux symptoms that don't respond to standard doses of PPI  and are easily confused as having aecopd or asthma flares by even experienced allergists/ pulmonologists (myself included).    Of the three most common causes of  Sub-acute / recurrent or chronic cough, only one (GERD)  can actually contribute to/ trigger  the other two (asthma and post nasal drip syndrome)  and perpetuate the cylce of cough.  While not intuitively obvious, many patients with chronic low grade reflux do not cough until there is a primary insult that disturbs the protective epithelial barrier and exposes sensitive nerve endings.   This is typically viral but can due to PNDS and  either may apply here.     The point is that once this occurs, it is difficult to eliminate the cycle  using anything but a maximally effective acid suppression regimen at least in the short run, accompanied by an appropriate diet to address non acid GERD and control / eliminate the cough itself for at least 3 days but not able to take tramadol in high enough doses for long enough to prove this and concerned about recurrent airway dz so reasonable  to do fob then use demerol to eliminate the cyclical cough    Discussed in detail all the  indications, usual  risks and alternatives  relative to the benefits with patient who agrees to proceed with bronchoscopy first, then suppress the cough with 3 straight days off work

## 2018-08-09 NOTE — Assessment & Plan Note (Signed)
In the setting of respiratory symptoms of unknown etiology,  It would be preferable to use bystolic, the most beta -1  selective Beta blocker available in sample form, with bisoprolol the most selective generic choice  on the market, at least on a trial basis, to make sure the spillover Beta 2 effects of the less specific Beta blockers are not contributing to this patient's symptoms.   Also, For reasons that may related to vascular permability and nitric oxide pathways but not elevated  bradykinin levels (as seen with  ACEi use) losartan in the generic form has been reported now from mulitple sources  to cause a similar pattern of non-specific  upper airway symptoms as seen with acei.   This has not been reported with exposure to the other ARB's to date, so it seems reasonable for now to try either generic diovan or avapro if ARB needed or use an alternative class altogether.  See:  Lelon Frohlich Allergy Asthma Immunol  2008: 101: p [phone]     Will try change to bisoprolol 5 mg daily alone to see if that helps any of her symptoms.    I had an extended discussion with the patient reviewing all relevant studies completed to date and  lasting 15 to 20 minutes of a 25 minute visit    Each maintenance medication was reviewed in detail including most importantly the difference between maintenance and prns and under what circumstances the prns are to be triggered using an action plan format that is not reflected in the computer generated alphabetically organized AVS.     Please see AVS for specific instructions unique to this visit that I personally wrote and verbalized to the the pt in detail and then reviewed with pt  by my nurse highlighting any  changes in therapy recommended at today's visit to their plan of care.

## 2018-08-11 ENCOUNTER — Encounter: Payer: BLUE CROSS/BLUE SHIELD | Admitting: Gastroenterology

## 2018-08-15 ENCOUNTER — Telehealth: Payer: Self-pay | Admitting: Internal Medicine

## 2018-08-15 NOTE — Telephone Encounter (Signed)
Yes definitely if still coughing to be sure the source is not from the same source as before - the quantity of mucus is not really helpful in that regard and best to take a quick look as the next step

## 2018-08-15 NOTE — H&P (Signed)
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 Pulmonary Consult    Referred by Dr. Eliezer Estrada. Pt c/o cough for the past year, worse x 2 wks- prod with bright red blood.  Cough is worse in the am's, she wakes up and sees blood on her pillow. She has also been having SOB with walking up stairs or at a fast pace. She is using an albuterol inhaler 2 x daily on average.   intermittent cough /wheeze x 1.5 years with nl spirometry during flare and low grade hemoptysis x one year s epistaxis  Feels "all better" only while on prednisone with baseline 5/7 days of the week has minimal symptoms and really Not limited by breathing from desired activities  But 2/7 days some wheeze/doe and cough productive of streaks of blood but nothing purulent Has gerd already on ppi s overt hb  W/u Fob 08/10/2017 > polypoid smooth mass obst LLL sup segment > atypical cells on cytology but bx of polypoid lesion= nl epithelium with benign features > excision by Amanda Estrada 09/19/17 >Video bronchoscopy with endobronchial tumor resection and laser ablation. benign bronchial papillary adenoma.      07/28/2018 acute extended ov/Amanda Estrada re:  Re-establish re cough ? Recurrent bronchial adenoma?     Chief Complaint  Patient presents with  . Acute Visit    Cough x 1 month- clear sputum.  Cough is worse in the mornings.  She has noticed that talking can trigger the cough.     indolent onset of recurrent day greater than nocturnal cough around 1 month prior to office visit while being maintained on Singulair and Zyrtec with the addition of Protonix albuterol and Flonase making no difference so far and also switched from Zyrtec to Xyzal also with no difference so far. rec Prednisone 10 mg take  4 each am x 2 days,   2 each am x 2 days,  1 each am x 2 days and stop  Change protonix to 40 mg Take 30- 60 min before your first and last meals of the day  Take delsym two tsp every 12 hours and supplement if needed with  tramadol 50 mg up to 1 every 4 hours . Once you have eliminated the cough for 3 straight days try reducing the tramadol first,  then the delsym as tolerated.   GERD (REFLUX)  is an extremely common cause of respiratory symptoms just like yours , many times with no obvious heartburn at all.     07/31/18  NP rec Symptoms seem to be reflux related Strict bland diet - hand out given Avoid foods that increase reflux - hand out given Will order carafate Please continue Protonix as directed  Continue delsym and ultram for cough May stop prednisone for the next couple of days  Please call us with an update in a couple of days and for instructions on prednisone Follow up with Dr. Melvyn Estrada in around 1 week   08/07/2018  f/u ov/Wert re: cough since late aug 2019      Chief Complaint  Patient presents with  . Follow-up    She is coughing with clear sputum. She has not had any more hemoptysis since the last visit.    Dyspnea:  no Cough: wakes up coughing around 4 am but just clear mucus now x maybe 1 tsp  Sleeping: ok until 4 am   SABA use: not helping 02: none    Also overt hb despite  ppi bid and added  carafate some better  / cough worse with voice use also   No obvious day to day or daytime variability or assoc excess/ purulent sputum or mucus plugs or hemoptysis or cp or chest tightness,  subjective wheeze or overt sinus or hb symptoms.     Also denies any obvious fluctuation of symptoms with weather or environmental changes or other aggravating or alleviating factors except as outlined above   No unusual exposure hx or h/o childhood pna/ asthma or knowledge of premature birth.  Current Allergies, Complete Past Medical History, Past Surgical History, Family History, and Social History were reviewed in [employer] record.  ROS  The following are not active complaints unless bolded Hoarseness, sore throat, dysphagia, dental problems, itching, sneezing,  nasal congestion or discharge of excess mucus or purulent secretions, ear ache,   fever, chills, sweats, unintended wt loss or wt gain, classically pleuritic or exertional cp,  orthopnea pnd or arm/hand swelling  or leg swelling, presyncope, palpitations, abdominal pain, anorexia, nausea, vomiting, diarrhea  or change in bowel habits or change in bladder habits, change in stools or change in urine, dysuria, hematuria,  rash, arthralgias, visual complaints, headache, numbness, weakness or ataxia or problems with walking or coordination,  change in mood or  memory.            Current Meds  Medication Sig  . albuterol (VENTOLIN HFA) 108 (90 Base) MCG/ACT inhaler Inhale 2 puffs into lungs every 6 hours as needed for wheezing.  Marland Kitchen aspirin 81 MG chewable tablet Chew by mouth daily.  . cloNIDine (CATAPRES) 0.1 MG tablet Take 1 tablet (0.1 mg total) by mouth at bedtime.  Marland Kitchen diltiazem (CARDIZEM CD) 240 MG 24 hr capsule TAKE 1 CAPSULE (240 MG TOTAL) BY MOUTH ONCE DAILY.  Marland Kitchen ELIDEL 1 % cream Apply 1 application daily as needed topically (for allergies under eyes).   . fluticasone (FLONASE) 50 MCG/ACT nasal spray USE 1 SPRAY IN THE NOSE DAILY AS DIRECTED  . levocetirizine (XYZAL) 5 MG tablet Take 5 mg by mouth every evening.  . pantoprazole (PROTONIX) 40 MG tablet Take 30- 60 min before your first and last meals of the  day  . PLENVU 140 g SOLR Take 1 kit by mouth as directed.  . rosuvastatin (CRESTOR) 20 MG tablet Take 1 tablet (20 mg total) by mouth daily.  Marland Kitchen spironolactone (ALDACTONE) 50 MG tablet Take 1 tablet (50 mg total) by mouth daily.  Manus Gunning BOWEL PREP KIT 17.5-3.13-1.6 GM/177ML SOLN See admin instructions.  . traMADol (ULTRAM) 50 MG tablet One every 4 hours as needed for severe cough  . [  benzonatate (TESSALON) 200 MG capsule      Take 10 mg by mouth daily.  . [ ] losartan (COZAAR) 100 MG tablet TAKE 1 TABLET (  100 MG TOTAL) BY MOUTH DAILY.  Marland Kitchen  ] metoprolol succinate (TOPROL-XL) 25 MG 24 hr tablet Take 1 tablet (25 mg total) by mouth daily. (Patient taking differently: Take 25 mg by mouth daily as needed (for AFIB). )  . [ ] montelukast (SINGULAIR) 10 MG tablet TAKE 1 TABLET (10 MG TOTAL) BY MOUTH AT BEDTIME.                            Objective:   Physical Exam    Amb wf nad with harsh upper airway sounding cough on insp    08/07/2018       172      07/28/2018      173   08/04/17 188 lb 6.4 oz (85.5 kg)  08/02/17 190 lb 8 oz (86.4 kg)  03/04/17 199 lb 8 oz (90.5 kg)        Vital signs reviewed - Note on arrival 02 sats  95% on RA     HEENT: nl dentition, turbinates bilaterally, and oropharynx. Nl external ear canals without cough reflex   NECK :  without JVD/Nodes/TM/ nl carotid upstrokes bilaterally   LUNGS: no acc muscle use,  Nl contour chest which is clear to A and P bilaterally without cough on exp maneuvers   CV:  RRR  no s3 or murmur or increase in P2, and no edema   ABD:  soft and nontender with nl inspiratory excursion in the supine position. No bruits or organomegaly appreciated, bowel sounds nl  MS:  Nl gait/ ext warm without deformities, calf tenderness, cyanosis or clubbing No obvious joint restrictions   SKIN: warm and dry without lesions    NEURO:  alert, approp, nl sensorium with  no motor or cerebellar deficits  apparent.                 Assessment:                  Patient Instructions by Tanda Rockers, MD at 08/07/2018 9:15 AM                                  Assessment & Plan Note by Tanda Rockers, MD at 08/09/2018 11:00 AM  Author: Tanda Rockers, MD Author Type: Physician Filed: 08/09/2018 11:02 AM  Note Status: Written Cosign: Cosign Not Required Encounter Date: 08/07/2018  Problem: Essential hypertension  Editor: Tanda Rockers, MD (Physician)    In the setting of respiratory symptoms of unknown etiology,  It would be preferable to use bystolic, the most beta -1  selective Beta blocker available in sample form, with bisoprolol the most selective generic choice  on the market, at least on a trial basis, to make sure the spillover Beta 2 effects of the less specific Beta blockers are not contributing to this patient's symptoms.   Also, For reasons that may related to vascular permability and nitric oxide pathways but not elevated  bradykinin levels (as seen with  ACEi use) losartan in the generic form has been reported now from mulitple sources  to cause a similar pattern of non-specific  upper airway symptoms as seen with acei.   This has not been reported with exposure to the other ARB's to date, so it seems reasonable for now to try either generic diovan or avapro if ARB needed or use an alternative class altogether.  See:  Lelon Frohlich Allergy Asthma Immunol  2008: 101: p [phone]     Will try change to bisoprolol 5 mg daily alone to see if that helps any of her symptoms.    I had an extended discussion with the patient reviewing all relevant studies completed to date and  lasting 15 to 20 minutes of a 25 minute visit    Each maintenance medication was reviewed in detail including most importantly the difference between maintenance and prns and under what circumstances the prns are to be triggered using an action plan format that is not reflected in the computer  generated alphabetically organized AVS.     Please see AVS for specific instructions unique to this visit that I personally wrote and verbalized to the the pt in detail and then reviewed with pt  by my nurse highlighting any  changes in therapy recommended at today's visit to their plan of care.      Assessment & Plan Note by Tanda Rockers, MD at 08/09/2018 10:55 AM  Author: Tanda Rockers, MD Author Type: Physician Filed: 08/09/2018 11:00 AM  Note Status: Written Cosign: Cosign Not Required Encounter Date: 08/07/2018  Problem: Chronic cough  Editor: Tanda Rockers, MD (Physician)    Onset around Ag 20 2019 worse with voice use and on insp typical of Upper airway cough syndrome (previously labeled PNDS),  is so named because it's frequently impossible to sort out how much is  CR/sinusitis with freq throat clearing (which can be related to primary GERD)   vs  causing  secondary (" extra esophageal")  GERD from wide swings in gastric pressure that occur with throat clearing, often  promoting self use of mint and menthol lozenges that reduce the lower esophageal sphincter tone and exacerbate the problem further in a cyclical fashion.   These are the same pts (now being labeled as having "irritable larynx syndrome" by some cough centers) who not infrequently have a history of having failed to tolerate ace inhibitors,  dry powder inhalers or biphosphonates or report having atypical/extraesophageal reflux symptoms that don't respond to standard doses of PPI  and are easily confused as having aecopd or asthma flares by even experienced allergists/ pulmonologists (myself included).    Of the three most common causes of  Sub-acute / recurrent or chronic cough, only one (GERD)  can actually contribute to/ trigger  the other two (asthma and post nasal drip syndrome)  and perpetuate the cylce of cough.  While not intuitively obvious, many patients with chronic low grade reflux do not cough until there  is a primary insult that disturbs the protective epithelial barrier and exposes sensitive nerve endings.   This is typically viral but can due to PNDS and  either may apply here.     The point is that once this occurs, it is difficult to eliminate the cycle  using anything but a maximally effective acid suppression regimen at least in the short run, accompanied by an appropriate diet to address non acid GERD and control / eliminate the cough itself for at least 3 days but not able to take tramadol in high enough doses for long enough to prove this and concerned about recurrent airway dz so reasonable to do fob then use demerol to eliminate the cyclical cough    Discussed in detail all the  indications, usual  risks and alternatives  relative to the benefits with patient who agrees to proceed with bronchoscopy first, then suppress the cough with  3 straight days off work           08/16/2018  day of FOB:  No change hx or exam / continues with dry cough    Christinia Gully, MD Pulmonary and [place] [phone] After 5:30 PM or weekends, use Beeper [phone]

## 2018-08-15 NOTE — Telephone Encounter (Signed)
Called and spoke with patient, she stated that she still has a cough however it is no longer productive she is not able to cough anything up. Patient is feeling a little better. She wants to know if she should still have the procedure,   MW please advise, thank you.

## 2018-08-15 NOTE — Telephone Encounter (Signed)
Called and spoke with patient she is aware of MW response and verbalized understanding. Nothing further needed.

## 2018-08-16 ENCOUNTER — Encounter (HOSPITAL_COMMUNITY)
Admission: RE | Disposition: A | Discharge status: Home / Self Care | Payer: Self-pay | Source: Ambulatory Visit | Attending: Internal Medicine

## 2018-08-16 ENCOUNTER — Ambulatory Visit (HOSPITAL_COMMUNITY)
Admission: RE | Admit: 2018-08-16 | Discharge: 2018-08-16 | Disposition: A | Discharge status: Home / Self Care | Payer: BLUE CROSS/BLUE SHIELD | Source: Ambulatory Visit | Attending: Internal Medicine | Admitting: Internal Medicine

## 2018-08-16 DIAGNOSIS — Z7982 Long term (current) use of aspirin: Secondary | ICD-10-CM | POA: Insufficient documentation

## 2018-08-16 DIAGNOSIS — R918 Other nonspecific abnormal finding of lung field: Secondary | ICD-10-CM

## 2018-08-16 DIAGNOSIS — K219 Gastro-esophageal reflux disease without esophagitis: Secondary | ICD-10-CM | POA: Insufficient documentation

## 2018-08-16 DIAGNOSIS — Z7951 Long term (current) use of inhaled steroids: Secondary | ICD-10-CM | POA: Diagnosis not present

## 2018-08-16 DIAGNOSIS — R05 Cough: Secondary | ICD-10-CM | POA: Insufficient documentation

## 2018-08-16 DIAGNOSIS — I10 Essential (primary) hypertension: Secondary | ICD-10-CM | POA: Insufficient documentation

## 2018-08-16 DIAGNOSIS — R059 Cough, unspecified: Secondary | ICD-10-CM | POA: Diagnosis present

## 2018-08-16 HISTORY — PX: VIDEO BRONCHOSCOPY: SHX5072

## 2018-08-16 LAB — BODY FLUID CELL COUNT WITH DIFFERENTIAL
Lymphs, Fluid: 6 %
Monocyte-Macrophage-Serous Fluid: 13 % — ABNORMAL LOW (ref 50–90)
Neutrophil Count, Fluid: 81 % — ABNORMAL HIGH (ref 0–25)
Total Nucleated Cell Count, Fluid: 180 cu mm (ref 0–1000)

## 2018-08-16 SURGERY — VIDEO BRONCHOSCOPY WITHOUT FLUORO
Anesthesia: Moderate Sedation | Laterality: Bilateral

## 2018-08-16 MED ORDER — LIDOCAINE HCL (PF) 1 % IJ SOLN
INTRAMUSCULAR | Status: DC | PRN
  Administered 2018-08-16: 6 mL

## 2018-08-16 MED ORDER — MEPERIDINE HCL 100 MG/ML IJ SOLN
INTRAMUSCULAR | Status: AC
  Filled 2018-08-16: qty 2

## 2018-08-16 MED ORDER — MIDAZOLAM HCL 5 MG/ML IJ SOLN
INTRAMUSCULAR | Status: AC
  Filled 2018-08-16: qty 2

## 2018-08-16 MED ORDER — LIDOCAINE HCL 2 % EX GEL
1.0000 "application " | Freq: Once | CUTANEOUS | Status: DC
  Filled 2018-08-16: qty 5

## 2018-08-16 MED ORDER — MEPERIDINE HCL 100 MG/ML IJ SOLN: 100.0000 mg | Freq: Once | INTRAMUSCULAR | Status: DC

## 2018-08-16 MED ORDER — PHENYLEPHRINE HCL 0.25 % NA SOLN: 1.0000 | Freq: Four times a day (QID) | NASAL | Status: DC | PRN

## 2018-08-16 MED ORDER — SODIUM CHLORIDE 0.9 % IV SOLN
INTRAVENOUS | Status: DC
  Administered 2018-08-16: 08:00:00 via INTRAVENOUS

## 2018-08-16 MED ORDER — MIDAZOLAM HCL 5 MG/ML IJ SOLN: 1.0000 mg | Freq: Once | INTRAMUSCULAR | Status: DC

## 2018-08-16 MED ORDER — PHENYLEPHRINE HCL 0.25 % NA SOLN
NASAL | Status: DC | PRN
  Administered 2018-08-16: 2 via NASAL

## 2018-08-16 MED ORDER — MIDAZOLAM HCL 10 MG/2ML IJ SOLN
INTRAMUSCULAR | Status: DC | PRN
  Administered 2018-08-16: 2.5 mg via INTRAVENOUS

## 2018-08-16 MED ORDER — LIDOCAINE HCL URETHRAL/MUCOSAL 2 % EX GEL
CUTANEOUS | Status: DC | PRN
  Administered 2018-08-16: 1

## 2018-08-16 MED ORDER — MEPERIDINE HCL 25 MG/ML IJ SOLN
INTRAMUSCULAR | Status: DC | PRN
  Administered 2018-08-16 (×2): 50 mg via INTRAVENOUS

## 2018-08-16 NOTE — Op Note (Addendum)
Bronchoscopy Procedure Note  Date of Operation: 08/16/2018   Pre-op Diagnosis: chronic cough  Post-op Diagnosis: same  Surgeon: Christinia Gully  Anesthesia: Monitored Local Anesthesia with Sedation Time Started: 0813 Time Stopped:  0828  Total of demerol 167m IV/ versed 2.5 mg IV for cough suppression and sedation   Operation: Video Flexible fiberoptic bronchoscopy, diagnostic   Findings: nl airways, no residual changes LLL sup segment wide open  Specimen: BAL for cell count  Estimated Blood Loss: none  Complications: none  Indications and History: See updated H and P same date. The risks, benefits, complications, treatment options and expected outcomes were discussed with the patient.  The possibilities of reaction to medication, pulmonary aspiration, perforation of a viscus, bleeding, failure to diagnose a condition and creating a complication requiring transfusion or operation were discussed with the patient who freely signed the consent.    Description of Procedure: The patient was re-examined in the bronchoscopy suite and the site of surgery properly noted/marked.  The patient was identified  and the procedure verified as Flexible Fiberoptic Bronchoscopy.  A Time Out was held and the above information confirmed.   After the induction of topical nasopharyngeal anesthesia, the patient was positioned  and the bronchoscope was passed through the L naris. The vocal cords were visualized and  1% buffered lidocaine 5 ml was topically placed onto the cords. The cords were nl. The scope was then passed into the trachea.  1% buffered lidocaine given topically. Airways inspected bilaterally to the subsegmental level with the following findings:  nl airways, no residual changes LLL sup segment wide open as were all airways to the subsegmental level    Interventions: BAL LLL for cell count and diff     The Patient was taken to the Endoscopy Recovery area in satisfactory  condition.  Attestation: I performed the procedure.  MChristinia Gully MD Pulmonary and [phone] 5:30 PM or weekends, call [phone]

## 2018-08-16 NOTE — Discharge Instructions (Signed)
Flexible Bronchoscopy, Care After These instructions give you information on caring for yourself after your procedure. Your doctor may also give you more specific instructions. Call your doctor if you have any problems or questions after your procedure. Follow these instructions at home:  Do not eat or drink anything for 2 hours after your procedure. If you try to eat or drink before the medicine wears off, food or drink could go into your lungs. You could also burn yourself.  After 2 hours have passed and when you can cough and gag normally, you may eat soft food and drink liquids slowly.  The day after the test, you may eat your normal diet.  You may do your normal activities.  Keep all doctor visits. Get help right away if:  You get more and more short of breath.  You get light-headed.  You feel like you are going to pass out (faint).  You have chest pain.  You have new problems that worry you.  You cough up more than a little blood.  You cough up more blood than before. This information is not intended to replace advice given to you by your health care provider. Make sure you discuss any questions you have with your health care provider. Document Released: 08/22/2009 Document Revised: 04/01/2016 Document Reviewed: 06/29/2013 Elsevier Interactive Patient Education  2017 [place] not eat or drink until after 1015 today 08/16/18

## 2018-08-16 NOTE — Progress Notes (Signed)
Video bronchoscopy performed Intervention bronchial washings Patient tolerated well  America Sandall David RRT  

## 2018-08-16 NOTE — Progress Notes (Signed)
Spoke with pt and notified of results per Dr. Wert. Pt verbalized understanding and denied any questions. 

## 2018-08-17 ENCOUNTER — Other Ambulatory Visit: Payer: Self-pay | Admitting: Cardiology

## 2018-08-17 ENCOUNTER — Encounter (HOSPITAL_COMMUNITY): Payer: Self-pay | Admitting: Internal Medicine

## 2018-08-18 ENCOUNTER — Other Ambulatory Visit: Payer: Self-pay | Admitting: Cardiology

## 2018-08-28 DIAGNOSIS — D485 Neoplasm of uncertain behavior of skin: Secondary | ICD-10-CM | POA: Diagnosis not present

## 2018-08-28 DIAGNOSIS — D2262 Melanocytic nevi of left upper limb, including shoulder: Secondary | ICD-10-CM | POA: Diagnosis not present

## 2018-08-28 DIAGNOSIS — D225 Melanocytic nevi of trunk: Secondary | ICD-10-CM | POA: Diagnosis not present

## 2018-08-28 DIAGNOSIS — Z1283 Encounter for screening for malignant neoplasm of skin: Secondary | ICD-10-CM | POA: Diagnosis not present

## 2018-08-28 DIAGNOSIS — D239 Other benign neoplasm of skin, unspecified: Secondary | ICD-10-CM

## 2018-08-28 DIAGNOSIS — D2260 Melanocytic nevi of unspecified upper limb, including shoulder: Secondary | ICD-10-CM | POA: Diagnosis not present

## 2018-08-28 DIAGNOSIS — Z85820 Personal history of malignant melanoma of skin: Secondary | ICD-10-CM | POA: Diagnosis not present

## 2018-08-28 HISTORY — DX: Other benign neoplasm of skin, unspecified: D23.9

## 2018-09-01 ENCOUNTER — Encounter: Payer: BLUE CROSS/BLUE SHIELD | Admitting: Gastroenterology

## 2018-09-17 ENCOUNTER — Other Ambulatory Visit: Payer: Self-pay | Admitting: Cardiology

## 2018-09-26 ENCOUNTER — Ambulatory Visit
Admission: RE | Admit: 2018-09-26 | Discharge: 2018-09-26 | Disposition: A | Discharge status: Home / Self Care | Payer: BLUE CROSS/BLUE SHIELD | Source: Ambulatory Visit | Attending: Thoracic Surgery (Cardiothoracic Vascular Surgery) | Admitting: Thoracic Surgery (Cardiothoracic Vascular Surgery)

## 2018-09-26 ENCOUNTER — Other Ambulatory Visit: Payer: Self-pay

## 2018-09-26 ENCOUNTER — Encounter: Payer: Self-pay | Admitting: Thoracic Surgery (Cardiothoracic Vascular Surgery)

## 2018-09-26 ENCOUNTER — Ambulatory Visit (INDEPENDENT_AMBULATORY_CARE_PROVIDER_SITE_OTHER): Payer: BLUE CROSS/BLUE SHIELD | Admitting: Thoracic Surgery (Cardiothoracic Vascular Surgery)

## 2018-09-26 VITALS — BP 130/80 | HR 60 | Resp 20 | Ht 64.5 in | Wt 173.2 lb

## 2018-09-26 DIAGNOSIS — Z85118 Personal history of other malignant neoplasm of bronchus and lung: Secondary | ICD-10-CM | POA: Diagnosis not present

## 2018-09-26 DIAGNOSIS — C3490 Malignant neoplasm of unspecified part of unspecified bronchus or lung: Secondary | ICD-10-CM

## 2018-09-26 DIAGNOSIS — D369 Benign neoplasm, unspecified site: Secondary | ICD-10-CM | POA: Diagnosis not present

## 2018-09-26 NOTE — Progress Notes (Signed)
[address]       [place], [address]             [phone]     HPI: Amanda Estrada returns for one-year follow-up visit  Amanda Estrada is a 57 year old woman with a past history of hypertension, reflux, and paroxysmal atrial fibrillation.  She presented with last year with a 1 year history of persistent coughing, wheezing, and intermittent hemoptysis.  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  An endobronchial tumor resection and laser ablation on 09/19/2017.  She did well with the procedure went home on the same day.  Allergy showed no evidence of malignancy.  It appeared to be a benign bronchial papillary adenoma.  She says that after the procedure her cough resolved.  This fall she had a recurrence of the cough.  She said it felt very much like the cough she had before.  She saw Dr. Melvyn Novas.  He gave her a shot of prednisone which cleared up the cough.  He also did a bronchoscopy which showed no evidence of endobronchial lesions.  She is currently not having any coughing since she got the cortisone injection.  Past Medical History:  Diagnosis Date  . Allergy   . Anxiety   . Asthma   . Cataract   . GERD (gastroesophageal reflux disease)   . Glaucoma   . History of echocardiogram    a. 11/2015: EF 55-60%, no RWMA, nl LV diastolic fxn, PASP nl  . Hypertension   . Obesity   . PAF (paroxysmal atrial fibrillation) ([place])    a. CHADS2VASc = 2 (HTN, sex category)  . Sinusitis 2011   Recurrent per Dr. Tami Estrada with [facility]    Current Outpatient Medications  Medication Sig Dispense Refill  . albuterol (VENTOLIN HFA) 108 (90 Base) MCG/ACT inhaler Inhale 2 puffs into lungs every 6 hours as needed for wheezing. (Patient taking differently: Inhale 2 puffs into the lungs every 6 (six) hours as needed for wheezing. ) 18 g 5  . aspirin 81 MG chewable tablet Chew 81 mg by mouth at bedtime.     . bisoprolol  (ZEBETA) 5 MG tablet Take 1 tablet (5 mg total) by mouth daily. 30 tablet 5  . cloNIDine (CATAPRES) 0.1 MG tablet TAKE 1 TABLET (0.1 MG TOTAL) BY MOUTH AT BEDTIME. 90 tablet 1  . diltiazem (CARDIZEM CD) 240 MG 24 hr capsule TAKE 1 CAPSULE (240 MG TOTAL) BY MOUTH ONCE DAILY. PT OVERDUE FOR OV PLEASE CALL FOR APPT 30 capsule 0  . fluticasone (FLONASE) 50 MCG/ACT nasal spray USE 1 SPRAY IN THE NOSE DAILY AS DIRECTED (Patient taking differently: Place 1 spray into both nostrils daily as needed for allergies. ) 16 g 11  . levocetirizine (XYZAL) 5 MG tablet Take 5 mg by mouth at bedtime.     . pantoprazole (PROTONIX) 40 MG tablet Take 30- 60 min before your first and last meals of the day (Patient taking differently: Take 40 mg by mouth daily before breakfast. Take 30- 60 min before your first meals of the day) 60 tablet 3  . PLENVU 140 g SOLR Take 1 kit by mouth as directed. 1 each 0  . rosuvastatin (CRESTOR) 20 MG tablet TAKE 1 TABLET BY MOUTH EVERY DAY 90 tablet 1  . spironolactone (ALDACTONE) 50 MG tablet Take 1 tablet (50 mg total) by mouth daily. 90 tablet 3  . traMADol (ULTRAM) 50  MG tablet One every 4 hours as needed for severe cough (Patient taking differently: Take 50 mg by mouth every 4 (four) hours as needed (severe cough). ) 30 tablet 0  . sucralfate (CARAFATE) 1 g tablet Take 1 tablet (1 g total) by mouth 4 (four) times daily -  with meals and at bedtime for 5 days. (Patient not taking: Reported on 08/09/2018) 20 tablet 0   No current facility-administered medications for this visit.     Physical Exam BP 130/80 (BP Location: Left Arm, Patient Position: Sitting, Cuff Size: Normal)   Pulse 60   Resp 20   Ht 5' 4.5" (1.638 m)   Wt 173 lb 3.2 oz (78.6 kg)   SpO2 98% Comment: RA  BMI 29.90 kg/m  57 year old woman in no acute distress Alert and oriented x3 with no focal deficits Lungs clear with equal breath sounds bilaterally, no rales or wheezing Cardiac regular rate and rhythm normal  S1 and S2  Diagnostic Tests: CT CHEST WITHOUT CONTRAST  TECHNIQUE: Multidetector CT imaging of the chest was performed following the standard protocol without IV contrast.  COMPARISON:  CT chest of 08/02/2017  FINDINGS: Cardiovascular: Significant coronary artery calcifications are present diffusely but particularly in the distribution of the left anterior descending coronary artery. The heart is within normal limits in size. No pericardial effusion is seen. The mid ascending thoracic aorta measures 35 mm in diameter.  Mediastinum/Nodes: No mediastinal or hilar adenopathy is seen. The thyroid gland is unremarkable with tiny nodules present. There may be a small hiatal hernia present.  Lungs/Pleura: A small calcified granuloma is present within the right lower lobe peripherally peripherally. A small granuloma which is calcified also is noted in the anterior inferior right upper lobe. No suspicious lung nodule or mass is currently seen. No pleural effusion is noted and there is no evidence of parenchymal infiltrate. The airway appears to be patent.  Upper Abdomen: The upper abdomen is unremarkable other than a probably heavily calcified splenic artery aneurysm near the splenic hilum of doubtful significance, which appears stable compared to the prior CT. The pancreas is unremarkable.  Musculoskeletal: There are degenerative changes in the mid to lower thoracic spine. No compression deformity is seen.  IMPRESSION: 1. No parenchymal infiltrate or suspicious lung nodule or mass is seen. Small calcified granulomas are present on the right consistent with prior granulomatous disease. 2. Significant coronary artery calcifications. 3. Heavily calcified splenic artery aneurysm of doubtful significance.   Electronically Signed   By: Ivar Drape M.D.   On: 09/26/2018 15:10 I personally reviewed the CT images and concur with the findings noted  above  Impression: Amanda Estrada is a 57 year old woman with a history of hypertension, asthma, anxiety, reflux, and paroxysmal atrial fibrillation.  A year ago I did an endobronchial resection of a benign endobronchial tumor.  This was felt to be a probable papillary adenoma..  Her CT today shows no evidence of recurrence.  I am going to plan to do another CT in a year to rule out any evidence of recurrence or any other suspicious lesion.  Coronary calcification seen on CT-she does have a family history of coronary artery disease.  She does not have any anginal type symptoms.  Follow-up with primary  Plan: Return in 1 year with CT of chest  Melrose Nakayama, MD [facility] [phone]

## 2018-10-07 ENCOUNTER — Other Ambulatory Visit: Payer: Self-pay | Admitting: Family Medicine

## 2018-10-10 ENCOUNTER — Other Ambulatory Visit: Payer: Self-pay | Admitting: Family Medicine

## 2018-10-10 NOTE — Telephone Encounter (Signed)
Last office visit 07/14/2018 for chronic cough.  Singulair is not listed on current medication list.  Refill?

## 2018-10-16 ENCOUNTER — Other Ambulatory Visit: Payer: Self-pay | Admitting: Cardiology

## 2018-10-26 DIAGNOSIS — H04123 Dry eye syndrome of bilateral lacrimal glands: Secondary | ICD-10-CM | POA: Diagnosis not present

## 2018-11-21 DIAGNOSIS — D225 Melanocytic nevi of trunk: Secondary | ICD-10-CM | POA: Diagnosis not present

## 2018-11-21 DIAGNOSIS — L988 Other specified disorders of the skin and subcutaneous tissue: Secondary | ICD-10-CM | POA: Diagnosis not present

## 2018-11-23 ENCOUNTER — Encounter: Payer: Self-pay | Admitting: Gastroenterology

## 2018-11-23 ENCOUNTER — Ambulatory Visit (AMBULATORY_SURGERY_CENTER): Payer: Self-pay

## 2018-11-23 VITALS — Ht 65.0 in | Wt 172.6 lb

## 2018-11-23 DIAGNOSIS — Z800 Family history of malignant neoplasm of digestive organs: Secondary | ICD-10-CM

## 2018-11-23 MED ORDER — NA SULFATE-K SULFATE-MG SULF 17.5-3.13-1.6 GM/177ML PO SOLN: 1.0000 | Freq: Once | ORAL | 0 refills | Status: AC

## 2018-11-23 NOTE — Progress Notes (Signed)
Denies allergies to eggs or soy products. Denies complication of anesthesia or sedation. Denies use of weight loss medication. Denies use of O2.   Emmi instructions declined.  Patient already has Suprep at home because she was scheduled last fall and had to cancel.

## 2018-11-28 DIAGNOSIS — D225 Melanocytic nevi of trunk: Secondary | ICD-10-CM | POA: Diagnosis not present

## 2018-11-29 ENCOUNTER — Other Ambulatory Visit: Payer: BLUE CROSS/BLUE SHIELD

## 2018-11-30 ENCOUNTER — Other Ambulatory Visit: Payer: Self-pay | Admitting: Cardiology

## 2018-11-30 ENCOUNTER — Telehealth (INDEPENDENT_AMBULATORY_CARE_PROVIDER_SITE_OTHER): Payer: BLUE CROSS/BLUE SHIELD | Admitting: Family Medicine

## 2018-11-30 DIAGNOSIS — I10 Essential (primary) hypertension: Secondary | ICD-10-CM | POA: Diagnosis not present

## 2018-11-30 DIAGNOSIS — E785 Hyperlipidemia, unspecified: Secondary | ICD-10-CM

## 2018-11-30 NOTE — Telephone Encounter (Signed)
-----   Message from Lendon Collar, RT sent at 11/30/2018  3:40 PM EST ----- Regarding: Lab orders for tomorrow Friday 12/01/18 Please enter CPE lab orders for 12/01/18. Thanks!

## 2018-12-01 ENCOUNTER — Other Ambulatory Visit: Payer: Self-pay | Admitting: Internal Medicine

## 2018-12-01 ENCOUNTER — Other Ambulatory Visit (INDEPENDENT_AMBULATORY_CARE_PROVIDER_SITE_OTHER): Payer: BLUE CROSS/BLUE SHIELD

## 2018-12-01 DIAGNOSIS — E785 Hyperlipidemia, unspecified: Secondary | ICD-10-CM

## 2018-12-01 LAB — COMPREHENSIVE METABOLIC PANEL
ALT: 21 U/L (ref 0–35)
AST: 22 U/L (ref 0–37)
Albumin: 4.6 g/dL (ref 3.5–5.2)
Alkaline Phosphatase: 51 U/L (ref 39–117)
BUN: 11 mg/dL (ref 6–23)
CHLORIDE: 96 meq/L (ref 96–112)
CO2: 26 mEq/L (ref 19–32)
Calcium: 9.9 mg/dL (ref 8.4–10.5)
Creatinine, Ser: 0.68 mg/dL (ref 0.40–1.20)
GFR: 88.81 mL/min (ref >60.00)
Glucose, Bld: 92 mg/dL (ref 70–99)
POTASSIUM: 4.5 meq/L (ref 3.5–5.1)
Sodium: 130 mEq/L — ABNORMAL LOW (ref 135–145)
Total Bilirubin: 0.4 mg/dL (ref 0.2–1.2)
Total Protein: 7.2 g/dL (ref 6.0–8.3)

## 2018-12-01 LAB — CBC WITH DIFFERENTIAL/PLATELET
Basophils Absolute: 0 10*3/uL (ref 0.0–0.1)
Basophils Relative: 0.8 % (ref 0.0–3.0)
Eosinophils Absolute: 0.1 10*3/uL (ref 0.0–0.7)
Eosinophils Relative: 2.9 % (ref 0.0–5.0)
HCT: 38.1 % (ref 36.0–46.0)
Hemoglobin: 13 g/dL (ref 12.0–15.0)
Lymphocytes Relative: 22.2 % (ref 12.0–46.0)
Lymphs Abs: 1.1 10*3/uL (ref 0.7–4.0)
MCHC: 34.1 g/dL (ref 30.0–36.0)
MCV: 97 fl (ref 78.0–100.0)
Monocytes Absolute: 0.4 10*3/uL (ref 0.1–1.0)
Monocytes Relative: 7.4 % (ref 3.0–12.0)
Neutro Abs: 3.2 10*3/uL (ref 1.4–7.7)
Neutrophils Relative %: 66.7 % (ref 43.0–77.0)
Platelets: 272 10*3/uL (ref 150.0–400.0)
RBC: 3.93 Mil/uL (ref 3.87–5.11)
RDW: 12.7 % (ref 11.5–15.5)
WBC: 4.8 10*3/uL (ref 4.0–10.5)

## 2018-12-01 LAB — LIPID PANEL
Cholesterol: 158 mg/dL (ref 0–200)
HDL: 104.9 mg/dL (ref >39.00)
LDL Cholesterol: 43 mg/dL (ref 0–99)
NonHDL: 53.16
Total CHOL/HDL Ratio: 2
Triglycerides: 51 mg/dL (ref 0.0–149.0)
VLDL: 10.2 mg/dL (ref 0.0–40.0)

## 2018-12-04 ENCOUNTER — Ambulatory Visit (AMBULATORY_SURGERY_CENTER): Payer: BLUE CROSS/BLUE SHIELD | Admitting: Gastroenterology

## 2018-12-04 ENCOUNTER — Encounter: Payer: Self-pay | Admitting: Gastroenterology

## 2018-12-04 VITALS — BP 164/75 | HR 67 | Temp 98.0°F | Resp 14 | Ht 64.5 in | Wt 173.0 lb

## 2018-12-04 DIAGNOSIS — Z1211 Encounter for screening for malignant neoplasm of colon: Secondary | ICD-10-CM

## 2018-12-04 DIAGNOSIS — Z800 Family history of malignant neoplasm of digestive organs: Secondary | ICD-10-CM

## 2018-12-04 MED ORDER — SODIUM CHLORIDE 0.9 % IV SOLN: 500.0000 mL | Freq: Once | INTRAVENOUS | Status: DC

## 2018-12-04 NOTE — Op Note (Signed)
[place] Patient Name: Amanda Estrada Procedure Date: 12/04/2018 8:00 AM MRN: [id number] Endoscopist: Mallie Mussel L. Loletha Carrow , MD Age: 58 Referring MD:  Date of Birth: 1961/07/23 Gender: Female Account #: [id number] Procedure:                Colonoscopy Indications:              Screening in patient at increased risk: Family                            history of 1st-degree relative with colorectal                            cancer Medicines:                Monitored Anesthesia Care Procedure:                Pre-Anesthesia Assessment:                           - Prior to the procedure, a History and Physical                            was performed, and patient medications and                            allergies were reviewed. The patient's tolerance of                            previous anesthesia was also reviewed. The risks                            and benefits of the procedure and the sedation                            options and risks were discussed with the patient.                            All questions were answered, and informed consent                            was obtained. Anticoagulants: The patient has taken                            aspirin. It was decided not to withhold this                            medication prior to the procedure. ASA Grade                            Assessment: II - A patient with mild systemic                            disease. After reviewing the risks and benefits,  the patient was deemed in satisfactory condition to                            undergo the procedure.                           After obtaining informed consent, the colonoscope                            was passed under direct vision. Throughout the                            procedure, the patient's blood pressure, pulse, and                            oxygen saturations were monitored continuously. The   Colonoscope was introduced through the anus and                            advanced to the the cecum, identified by                            appendiceal orifice and ileocecal valve. The                            colonoscopy was performed without difficulty. The                            patient tolerated the procedure well. The quality                            of the bowel preparation was excellent. The                            ileocecal valve, appendiceal orifice, and rectum                            were photographed. Scope In: 8:03:05 AM Scope Out: 8:18:32 AM Scope Withdrawal Time: 0 hours 9 minutes 42 seconds  Total Procedure Duration: 0 hours 15 minutes 27 seconds  Findings:                 The perianal and digital rectal examinations were                            normal.                           The entire examined colon appeared normal on direct                            and retroflexion views. Complications:            No immediate complications. Estimated Blood Loss:     Estimated blood loss: none. Impression:               - The entire examined colon  is normal on direct and                            retroflexion views.                           - No specimens collected. Recommendation:           - Patient has a contact number available for                            emergencies. The signs and symptoms of potential                            delayed complications were discussed with the                            patient. Return to normal activities tomorrow.                            Written discharge instructions were provided to the                            patient.                           - Resume previous diet.                           - Continue present medications.                           - Repeat colonoscopy in 5 years for screening                            purposes. Cole Eastridge L. Loletha Carrow, MD 12/04/2018 8:21:04 AM This report has been signed  electronically.

## 2018-12-04 NOTE — Progress Notes (Signed)
I have reviewed the patient's medical history in detail and updated the computerized patient record.

## 2018-12-04 NOTE — Patient Instructions (Signed)
YOU HAD AN ENDOSCOPIC PROCEDURE TODAY AT THE [facility]:   Refer to the procedure report that was given to you for any specific questions about what was found during the examination.  If the procedure report does not answer your questions, please call your gastroenterologist to clarify.  If you requested that your care partner not be given the details of your procedure findings, then the procedure report has been included in a sealed envelope for you to review at your convenience later.  YOU SHOULD EXPECT: Some feelings of bloating in the abdomen. Passage of more gas than usual.  Walking can help get rid of the air that was put into your GI tract during the procedure and reduce the bloating. If you had a lower endoscopy (such as a colonoscopy or flexible sigmoidoscopy) you may notice spotting of blood in your stool or on the toilet paper. If you underwent a bowel prep for your procedure, you may not have a normal bowel movement for a few days.  Please Note:  You might notice some irritation and congestion in your nose or some drainage.  This is from the oxygen used during your procedure.  There is no need for concern and it should clear up in a day or so.  SYMPTOMS TO REPORT IMMEDIATELY:   Following lower endoscopy (colonoscopy or flexible sigmoidoscopy):  Excessive amounts of blood in the stool  Significant tenderness or worsening of abdominal pains  Swelling of the abdomen that is new, acute  Fever of 100F or higher  For urgent or emergent issues, a gastroenterologist can be reached at any hour by calling [phone].   DIET:  We do recommend a small meal at first, but then you may proceed to your regular diet.  Drink plenty of fluids but you should avoid alcoholic beverages for 24 hours.  ACTIVITY:  You should plan to take it easy for the rest of today and you should NOT DRIVE or use heavy machinery until tomorrow (because of the sedation medicines used during the test).     FOLLOW UP: Our staff will call the number listed on your records the next business day following your procedure to check on you and address any questions or concerns that you may have regarding the information given to you following your procedure. If we do not reach you, we will leave a message.  However, if you are feeling well and you are not experiencing any problems, there is no need to return our call.  We will assume that you have returned to your regular daily activities without incident.  If any biopsies were taken you will be contacted by phone or by letter within the next 1-3 weeks.  Please call us at [phone] if you have not heard about the biopsies in 3 weeks.    SIGNATURES/CONFIDENTIALITY: You and/or your care partner have signed paperwork which will be entered into your electronic medical record.  These signatures attest to the fact that that the information above on your After Visit Summary has been reviewed and is understood.  Full responsibility of the confidentiality of this discharge information lies with you and/or your care-partner. 

## 2018-12-05 ENCOUNTER — Telehealth: Payer: Self-pay | Admitting: *Deleted

## 2018-12-05 ENCOUNTER — Encounter: Payer: Self-pay | Admitting: Family Medicine

## 2018-12-05 ENCOUNTER — Ambulatory Visit (INDEPENDENT_AMBULATORY_CARE_PROVIDER_SITE_OTHER): Payer: BLUE CROSS/BLUE SHIELD | Admitting: Family Medicine

## 2018-12-05 VITALS — BP 170/70 | HR 64 | Temp 98.0°F | Ht 64.75 in | Wt 173.2 lb

## 2018-12-05 DIAGNOSIS — E7800 Pure hypercholesterolemia, unspecified: Secondary | ICD-10-CM

## 2018-12-05 DIAGNOSIS — I10 Essential (primary) hypertension: Secondary | ICD-10-CM | POA: Diagnosis not present

## 2018-12-05 DIAGNOSIS — Z0000 Encounter for general adult medical examination without abnormal findings: Secondary | ICD-10-CM

## 2018-12-05 DIAGNOSIS — E871 Hypo-osmolality and hyponatremia: Secondary | ICD-10-CM | POA: Diagnosis not present

## 2018-12-05 DIAGNOSIS — I4891 Unspecified atrial fibrillation: Secondary | ICD-10-CM

## 2018-12-05 DIAGNOSIS — J4530 Mild persistent asthma, uncomplicated: Secondary | ICD-10-CM

## 2018-12-05 LAB — BASIC METABOLIC PANEL
BUN: 11 mg/dL (ref 6–23)
CALCIUM: 10.7 mg/dL — AB (ref 8.4–10.5)
CO2: 25 meq/L (ref 19–32)
Chloride: 95 mEq/L — ABNORMAL LOW (ref 96–112)
Creatinine, Ser: 0.72 mg/dL (ref 0.40–1.20)
GFR: 83.13 mL/min (ref >60.00)
Glucose, Bld: 97 mg/dL (ref 70–99)
Potassium: 4.7 mEq/L (ref 3.5–5.1)
Sodium: 129 mEq/L — ABNORMAL LOW (ref 135–145)

## 2018-12-05 MED ORDER — DILTIAZEM HCL ER COATED BEADS 240 MG PO CP24: 240.0000 mg | ORAL_CAPSULE | Freq: Every day | ORAL | 0 refills | Status: DC

## 2018-12-05 NOTE — Telephone Encounter (Signed)
  Follow up Call-  Call back number 12/04/2018  Post procedure Call Back phone  # [phone]  Permission to leave phone message Yes  Some recent data might be hidden     Patient questions:  Message left to call us if necessary.  Second call.

## 2018-12-05 NOTE — Assessment & Plan Note (Signed)
Stable control on allergy meds and singulair.

## 2018-12-05 NOTE — Assessment & Plan Note (Addendum)
She TOOK HERSELF OFF ANTICOAGULANT AND IS NOW TAKING ONLY ASA baby. Seems paroxsysmal as it appears she is sinus on exam today. Rate controlled. Followed by Cardiology.  Encouraged pt to consider restarting anticoagulant  Given CVA risk  With Afob. She will discuss with Dr. Percival Spanish at upcoming [place].

## 2018-12-05 NOTE — Progress Notes (Signed)
Subjective:    Patient ID: Amanda Estrada, female    DOB: 18-Jul-1961, 58 y.o.   MRN: [id number]  HPI  The patient is here for annual wellness exam and preventative care.     Afib with RVR: followed by Dr.  Percival Spanish Cardiology... next appt 12/25/2018 SHE TOOK HERSELF OFF ANTICOAGULANT and is only taking a baby aspirin.  Hx of hemoptysis from lung mass s/p resection. Followed by Dr. Melvyn Novas Pulmonary/CVTS Dr. Roxan Hockey Mild persistent asthma   Elevated Cholesterol:  At goal on Crestor. Lab Results  Component Value Date   CHOL 158 12/01/2018   HDL 104.90 12/01/2018   LDLCALC 43 12/01/2018   LDLDIRECT 81.0 10/16/2010   TRIG 51.0 12/01/2018   CHOLHDL 2 12/01/2018  Using medications without problems: Muscle aches:  Diet compliance: good Exercise: 3 days a week.. riding bike. Other complaints:  Hypertension:   Elevated in office today. On diltiazem, spironolactone, clonidine, bisoprolol BP Readings from Last 3 Encounters:  12/05/18 (!) 170/70  12/04/18 (!) 164/75  09/26/18 130/80  Using medication without problems or lightheadedness:  none Chest pain with exertion:none Edema:none Short of breath:none Average home BPs: At home previously well controlled Other issues:    Social History /Family History/Past Medical History reviewed in detail and updated in EMR if needed. Blood pressure (!) 170/70, pulse 64, temperature 98 F (36.7 C), temperature source Oral, height 5' 4.75" (1.645 m), weight 173 lb 4 oz (78.6 kg), SpO2 98 %.  Review of Systems  Constitutional: Negative for fatigue and fever.  HENT: Negative for congestion.   Eyes: Negative for pain.  Respiratory: Negative for cough and shortness of breath.   Cardiovascular: Negative for chest pain, palpitations and leg swelling.  Gastrointestinal: Negative for abdominal pain.  Genitourinary: Negative for dysuria and vaginal bleeding.  Musculoskeletal: Negative for back pain.  Neurological: Negative for syncope,  light-headedness and headaches.  Psychiatric/Behavioral: Negative for dysphoric mood.       Objective:   Physical Exam Constitutional:      General: She is not in acute distress.    Appearance: Normal appearance. She is well-developed. She is not ill-appearing or toxic-appearing.  HENT:     Head: Normocephalic.     Right Ear: Hearing, tympanic membrane, ear canal and external ear normal.     Left Ear: Hearing, tympanic membrane, ear canal and external ear normal.     Nose: Nose normal.  Eyes:     General: Lids are normal. Lids are everted, no foreign bodies appreciated.     Conjunctiva/sclera: Conjunctivae normal.     Pupils: Pupils are equal, round, and reactive to light.  Neck:     Musculoskeletal: Normal range of motion and neck supple.     Thyroid: No thyroid mass or thyromegaly.     Vascular: No carotid bruit.     Trachea: Trachea normal.  Cardiovascular:     Rate and Rhythm: Normal rate and regular rhythm.     Heart sounds: Normal heart sounds, S1 normal and S2 normal. No murmur. No gallop.   Pulmonary:     Effort: Pulmonary effort is normal. No respiratory distress.     Breath sounds: Normal breath sounds. No wheezing, rhonchi or rales.  Abdominal:     General: Bowel sounds are normal. There is no distension or abdominal bruit.     Palpations: Abdomen is soft. There is no fluid wave or mass.     Tenderness: There is no abdominal tenderness. There is no guarding or rebound.  Hernia: No hernia is present.  Lymphadenopathy:     Cervical: No cervical adenopathy.  Skin:    General: Skin is warm and dry.     Findings: No rash.  Neurological:     Mental Status: She is alert.     Cranial Nerves: No cranial nerve deficit.     Sensory: No sensory deficit.  Psychiatric:        Mood and Affect: Mood is not anxious or depressed.        Speech: Speech normal.        Behavior: Behavior normal. Behavior is cooperative.        Judgment: Judgment normal.             Assessment & Plan:  The patient's preventative maintenance and recommended screening tests for an annual wellness exam were reviewed in full today. Brought up to date unless services declined.  Counselled on the importance of diet, exercise, and its role in overall health and mortality. The patient's FH and SH was reviewed, including their home life, tobacco status, and drug and alcohol status.   Vaccines: Uptodate  Pap/DVE:  11/2015 nml pap, neg HPV, repeat  In 5 years. No family history of ovarian and uterine cancer Mammo:  Due 11/2018.Marland Kitchen plan this week Bone Density: plan age 56 given mothers history. Colon:   2020 results pending, Dr. Loletha Carrow, plan q 5 year.  Smoking Status: nonsmoker ETOH/ drug use: none/none Hep C:  done HIV screen:  refused.

## 2018-12-05 NOTE — Assessment & Plan Note (Signed)
Possibly due to spironolactone. Has history of low Na in past... eval with  Repeat NA today ( has had a lot of salt with yesterdays colonoscopy and lots of chicken soup.

## 2018-12-05 NOTE — Assessment & Plan Note (Signed)
At goal on crestor.

## 2018-12-05 NOTE — Telephone Encounter (Signed)
No answer, left message to call if questions or concerns. 

## 2018-12-05 NOTE — Patient Instructions (Signed)
Please stop at the lab to have labs drawn.  

## 2018-12-06 ENCOUNTER — Encounter (INDEPENDENT_AMBULATORY_CARE_PROVIDER_SITE_OTHER): Payer: BLUE CROSS/BLUE SHIELD

## 2018-12-06 ENCOUNTER — Other Ambulatory Visit: Payer: BLUE CROSS/BLUE SHIELD

## 2018-12-06 DIAGNOSIS — Z0000 Encounter for general adult medical examination without abnormal findings: Secondary | ICD-10-CM | POA: Diagnosis not present

## 2018-12-06 DIAGNOSIS — R350 Frequency of micturition: Secondary | ICD-10-CM | POA: Diagnosis not present

## 2018-12-06 LAB — URINALYSIS, ROUTINE W REFLEX MICROSCOPIC
BILIRUBIN URINE: NEGATIVE
Hgb urine dipstick: NEGATIVE
Ketones, ur: NEGATIVE
Nitrite: NEGATIVE
Specific Gravity, Urine: 1.01 (ref 1.000–1.030)
Total Protein, Urine: NEGATIVE
Urine Glucose: NEGATIVE
Urobilinogen, UA: 0.2 (ref 0.0–1.0)
pH: 7.5 (ref 5.0–8.0)

## 2018-12-06 NOTE — Telephone Encounter (Signed)
Urine sent out of UA, Micro and Culture.

## 2018-12-07 DIAGNOSIS — Z1231 Encounter for screening mammogram for malignant neoplasm of breast: Secondary | ICD-10-CM | POA: Diagnosis not present

## 2018-12-07 MED ORDER — SULFAMETHOXAZOLE-TRIMETHOPRIM 800-160 MG PO TABS: 1.0000 | ORAL_TABLET | Freq: Two times a day (BID) | ORAL | 0 refills | Status: DC

## 2018-12-07 NOTE — Telephone Encounter (Signed)
I sent in septra ds. Concern for UTI cx pending. I will treat in cross-coverage.

## 2018-12-08 ENCOUNTER — Other Ambulatory Visit: Payer: Self-pay | Admitting: Family Medicine

## 2018-12-08 LAB — URINE CULTURE
MICRO NUMBER:: 122147
SPECIMEN QUALITY:: ADEQUATE

## 2018-12-08 MED ORDER — NITROFURANTOIN MONOHYD MACRO 100 MG PO CAPS: 100.0000 mg | ORAL_CAPSULE | Freq: Two times a day (BID) | ORAL | 0 refills | Status: DC

## 2018-12-08 NOTE — Progress Notes (Signed)
See result note.  

## 2018-12-14 DIAGNOSIS — M19079 Primary osteoarthritis, unspecified ankle and foot: Secondary | ICD-10-CM | POA: Diagnosis not present

## 2018-12-14 DIAGNOSIS — M67972 Unspecified disorder of synovium and tendon, left ankle and foot: Secondary | ICD-10-CM | POA: Diagnosis not present

## 2018-12-19 ENCOUNTER — Other Ambulatory Visit: Payer: Self-pay | Admitting: Cardiology

## 2018-12-23 NOTE — Progress Notes (Signed)
Cardiology Office Note   Date:  12/25/2018   ID:  Amanda Estrada, DOB Sep 05, 1961, MRN [id number]  PCP:  Jinny Sanders, MD  Cardiologist:   No primary care provider on file. Referring:  Jinny Sanders, MD  Chief Complaint  Patient presents with  . Atrial Fibrillation      History of Present Illness: Amanda Estrada is a 58 y.o. female who presents for follow up of atrial fib.  She has previously seen Dr. Rockey Situ for evaluation of this.  She has had reservations about taking anticoagulation secondary to cough and blood tinged sputum.  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  This was apparently benign.  She had coronary calcium on CT.  I saw her last year.  She was going to have a POET (Plain Old Exercise Treadmill) but she broke her leg.   She had a cough without evidence of endobronchial lesions per Dr. Melvyn Novas.     Since I last saw her she restarted Eliquis.  She has had low sodium with the last reading being 129.  It has been suggested that she stop her Spironolactone to see if this could be contributing but she did not want to stop this.  She is actually doing relatively well though she is been limited because of the leg and having had surgery.  She did physical therapy. The patient denies any new symptoms such as chest discomfort, neck or arm discomfort. There has been no new shortness of breath, PND or orthopnea. There have been no reported palpitations, presyncope or syncope.   Past Medical History:  Diagnosis Date  . Allergy   . Anxiety    patient denies  . Asthma   . Cataract   . GERD (gastroesophageal reflux disease)   . Glaucoma   . History of echocardiogram    a. 11/2015: EF 55-60%, no RWMA, nl LV diastolic fxn, PASP nl  . Hypertension   . Obesity   . PAF (paroxysmal atrial fibrillation) ([place])    a. CHADS2VASc = 2 (HTN, sex category)  . Sinusitis 2011   Recurrent per Dr. Tami Ribas with [facility]    Past  Surgical History:  Procedure Laterality Date  . BUNIONECTOMY  11/2007   Right foot, 2 hammer-toes and tendon replacement (Dr. Paulla Dolly)  . cataract Bilateral 06/2018   narrow angle glaucoma bilateral  . CERVICAL DISCECTOMY  2001   C5/6  . CESAREAN SECTION  1985   Due to incr BP  . COLONOSCOPY    . ENDOMETRIAL BIOPSY  03/27/2008   B9 endometrium w/breakdown changes (Dr, Kinscius)  . LASER BRONCHOSCOPY N/A 09/19/2017   Procedure: LASER BRONCHOSCOPY;  Surgeon: Melrose Nakayama, MD;  [facility];  Service: Thoracic;  Laterality: N/A;  . Lasix eye surgery    . LUNG SURGERY Left 09/19/2017   benign tumor removed  . NSVD  1989   VBAC  . Renal artery [place]  02/2007   [place] arteries but no stenosis  . TIBIA FRACTURE SURGERY Left 12/2017  . US TRANSVAGINAL PELVIC MODIFIED  04/09/2008   Normal  . VIDEO BRONCHOSCOPY Bilateral 08/10/2017   Procedure: VIDEO BRONCHOSCOPY WITHOUT FLUORO;  Surgeon: Tanda Rockers, MD;  Location: [facility];  Service: Cardiopulmonary;  Laterality: Bilateral;  . VIDEO BRONCHOSCOPY N/A 09/19/2017   Procedure: VIDEO BRONCHOSCOPY, with endobronchial tumor resection;  Surgeon: Melrose Nakayama, MD;  Location: [facility];  Service: Thoracic;  Laterality: N/A;  . VIDEO BRONCHOSCOPY Bilateral 08/16/2018   Procedure: VIDEO BRONCHOSCOPY WITHOUT FLUORO;  Surgeon: Tanda Rockers, MD;  Location: [facility];  Service: Cardiopulmonary;  Laterality: Bilateral;     Current Outpatient Medications  Medication Sig Dispense Refill  . albuterol (VENTOLIN HFA) 108 (90 Base) MCG/ACT inhaler Inhale 2 puffs into lungs every 6 hours as needed for wheezing. 18 g 5  . apixaban (ELIQUIS) 5 MG TABS tablet Take 1 tablet (5 mg total) by mouth 2 (two) times daily. 60 tablet 11  . bisoprolol (ZEBETA) 5 MG tablet Take 1 tablet (5 mg total) by mouth daily. 30 tablet 5  . cloNIDine (CATAPRES) 0.1 MG tablet TAKE 1 TABLET (0.1 MG TOTAL) BY MOUTH AT BEDTIME. 90 tablet 1  . diltiazem  (CARDIZEM CD) 240 MG 24 hr capsule Take 1 capsule (240 mg total) by mouth daily. 30 capsule 0  . fluticasone (FLONASE) 50 MCG/ACT nasal spray USE 1 SPRAY IN THE NOSE DAILY AS DIRECTED (Patient taking differently: Place 1 spray into both nostrils daily as needed for allergies. ) 16 g 11  . levocetirizine (XYZAL) 5 MG tablet Take 5 mg by mouth at bedtime.     . montelukast (SINGULAIR) 10 MG tablet TAKE 1 TABLET (10 MG TOTAL) BY MOUTH AT BEDTIME. 90 tablet 3  . rosuvastatin (CRESTOR) 20 MG tablet TAKE 1 TABLET BY MOUTH EVERY DAY 90 tablet 1  . spironolactone (ALDACTONE) 50 MG tablet Take 1 tablet (50 mg total) by mouth daily. 30 tablet 11  . hydrALAZINE (APRESOLINE) 25 MG tablet Take 1 tablet (25 mg total) by mouth 2 (two) times daily. 60 tablet 11  . sucralfate (CARAFATE) 1 g tablet Take 1 tablet (1 g total) by mouth 4 (four) times daily -  with meals and at bedtime for 5 days. (Patient not taking: Reported on 08/09/2018) 20 tablet 0   No current facility-administered medications for this visit.     Allergies:   Amoxicillin-pot clavulanate    ROS:  Please see the history of present illness.   Otherwise, review of systems are positive for none.   All other systems are reviewed and negative.    PHYSICAL EXAM: VS:  BP (!) 150/78   Pulse 65   Ht 5' 5.5" (1.664 m)   Wt 176 lb 6.4 oz (80 kg)   BMI 28.91 kg/m  , BMI Body mass index is 28.91 kg/m. GENERAL:  Well appearing NECK:  No jugular venous distention, waveform within normal limits, carotid upstroke brisk and symmetric, no bruits, no thyromegaly LUNGS:  Clear to auscultation bilaterally CHEST:  Unremarkable HEART:  PMI not displaced or sustained,S1 and S2 within normal limits, no S3, no S4, no clicks, no rubs, no murmurs ABD:  Flat, positive bowel sounds normal in frequency in pitch, no bruits, no rebound, no guarding, no midline pulsatile mass, no hepatomegaly, no splenomegaly EXT:  2 plus pulses throughout, no edema, no cyanosis no  clubbing     EKG:  EKG is  ordered today. The ekg ordered today demonstrates sinus rhythm, rate 65 1 month, axis within normal limits, intervals within normal limits, no acute ST-T wave changes.   Recent Labs: 12/01/2018: ALT 21; Hemoglobin 13.0; Platelets 272.0 12/05/2018: BUN 11; Creatinine, Ser 0.72; Potassium 4.7; Sodium 129    Lipid Panel    Component Value Date/Time   CHOL 158 12/01/2018 0834   TRIG 51.0 12/01/2018 0834   HDL 104.90 12/01/2018 0834   CHOLHDL 2 12/01/2018 0834   VLDL  10.2 12/01/2018 0834   LDLCALC 43 12/01/2018 0834   LDLDIRECT 81.0 10/16/2010 0939      Wt Readings from Last 3 Encounters:  12/25/18 176 lb 6.4 oz (80 kg)  12/05/18 173 lb 4 oz (78.6 kg)  12/04/18 173 lb (78.5 kg)      Other studies Reviewed: Additional studies/ records that were reviewed today include:   Labs Review of the above records demonstrates:    ASSESSMENT AND PLAN:   Paroxysmal atrial fibrillation ([place]) -   Ms. Renaldo Harrison has a CHA2DS2 - VASc score of 2.  She has not had any symptomatic paroxysms.  However, when she did have this in the past it was persistent difficult to control.  She does have some elevated heart rates on her Apple Watch.  Is not clear whether this could be fibrillation.  They are not symptomatic.  However, I agree that she should be on a blood thinner.  I would continue this.   Hypertension Her blood pressure actually is still not at target.  She does not tolerate clonidine more than at night.  She was taken off of the ARB with her lung issues.  I will give hydralazine 25 mg twice daily to supplement to once daily clonidine which she takes in the evening.  She will keep tabs on her blood pressure.   Hyperlipidemia LDL was at target of 51.  No change in meds.  Hyponatremia Her sodium is chronically low.  However, I would not want to discontinue the Spironolactone and I think her blood pressure would be much too high.  Therefore, this can be  followed conservatively.  She is not having any symptoms.  Coronary calcium At some point I would like to try to do a POET (Plain Old Exercise Treadmill).  However, with her continued leg pain she would not be able to do this at this point and so no change in therapy.   Current medicines are reviewed at length with the patient today.  The patient does not have concerns regarding medicines.  The following changes have been made:  As above  Labs/ tests ordered today include:  None  Orders Placed This Encounter  Procedures  . EKG 12-Lead     Disposition:   FU with me in 12 months.  Signed, Minus Breeding, MD  12/25/2018 11:56 AM    [place]

## 2018-12-25 ENCOUNTER — Ambulatory Visit (INDEPENDENT_AMBULATORY_CARE_PROVIDER_SITE_OTHER): Payer: BLUE CROSS/BLUE SHIELD | Admitting: Cardiology

## 2018-12-25 ENCOUNTER — Encounter: Payer: Self-pay | Admitting: Cardiology

## 2018-12-25 VITALS — BP 150/78 | HR 65 | Ht 65.5 in | Wt 176.4 lb

## 2018-12-25 DIAGNOSIS — I4891 Unspecified atrial fibrillation: Secondary | ICD-10-CM | POA: Diagnosis not present

## 2018-12-25 DIAGNOSIS — I10 Essential (primary) hypertension: Secondary | ICD-10-CM | POA: Diagnosis not present

## 2018-12-25 MED ORDER — SPIRONOLACTONE 50 MG PO TABS: 50.0000 mg | ORAL_TABLET | Freq: Every day | ORAL | 11 refills | Status: DC

## 2018-12-25 MED ORDER — APIXABAN 5 MG PO TABS: 5.0000 mg | ORAL_TABLET | Freq: Two times a day (BID) | ORAL | 11 refills | Status: DC

## 2018-12-25 MED ORDER — HYDRALAZINE HCL 25 MG PO TABS: 25.0000 mg | ORAL_TABLET | Freq: Two times a day (BID) | ORAL | 11 refills | Status: DC

## 2018-12-25 NOTE — Patient Instructions (Signed)
Medication Instructions:  START- Hydralazine 25 mg twice a day  If you need a refill on your cardiac medications before your next appointment, please call your pharmacy.  Labwork: None Ordered   Testing/Procedures: None Ordered  Follow-Up: You will need a follow up appointment in 1 Year.  Please call our office 2 months in advance to schedule this appointment.  You may see Dr Percival Spanish or one of the following Advanced Practice Providers on your designated Care Team:   Rosaria Ferries, PA-C . Jory Sims, DNP, ANP    At [facility], you and your health needs are our priority.  As part of our continuing mission to provide you with exceptional heart care, we have created designated Provider Care Teams.  These Care Teams include your primary Cardiologist (physician) and Advanced Practice Providers (APPs -  Physician Assistants and Nurse Practitioners) who all work together to provide you with the care you need, when you need it.  Thank you for choosing [facility] at [facility]!!

## 2018-12-29 ENCOUNTER — Other Ambulatory Visit: Payer: Self-pay | Admitting: Cardiology

## 2019-01-01 NOTE — Telephone Encounter (Signed)
Rx(s) sent to pharmacy electronically.  

## 2019-01-02 DIAGNOSIS — M65871 Other synovitis and tenosynovitis, right ankle and foot: Secondary | ICD-10-CM | POA: Diagnosis not present

## 2019-01-02 DIAGNOSIS — L84 Corns and callosities: Secondary | ICD-10-CM | POA: Diagnosis not present

## 2019-01-02 DIAGNOSIS — M19071 Primary osteoarthritis, right ankle and foot: Secondary | ICD-10-CM | POA: Diagnosis not present

## 2019-01-02 DIAGNOSIS — M19072 Primary osteoarthritis, left ankle and foot: Secondary | ICD-10-CM | POA: Diagnosis not present

## 2019-01-08 IMAGING — CT CT CHEST W/O CM
2 of 4 series · 11 of 36 positions shown, 13 images · non-contrast
Comparison: CT chest of 08/02/2017

CLINICAL DATA: History of resection of non-small cell lung
carcinoma 1 year ago, follow-up

EXAM:
CT CHEST WITHOUT CONTRAST
TECHNIQUE: Multidetector CT imaging of the chest was performed following the
standard protocol without IV contrast.

[Series 2: chest 2.00 br40 s3 ax · axial · 0.55mm/px · z∈[+1433,+1695]mm · 8 of 153 slices shown, 10 images]
[im 11/153  mediastinal]
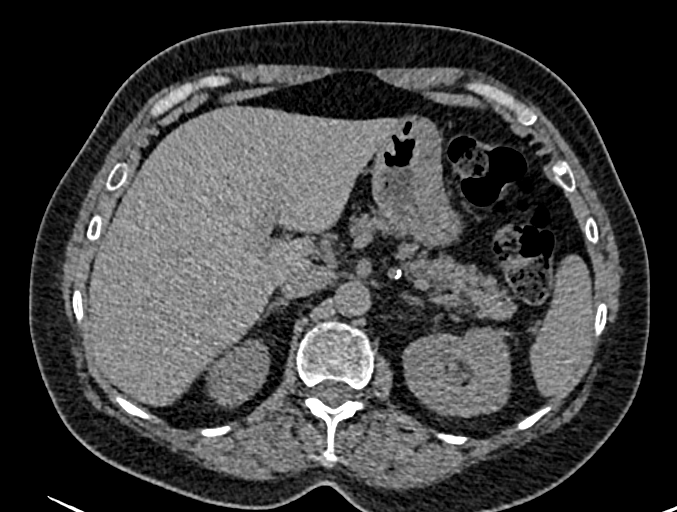
[im 11/153  lung]
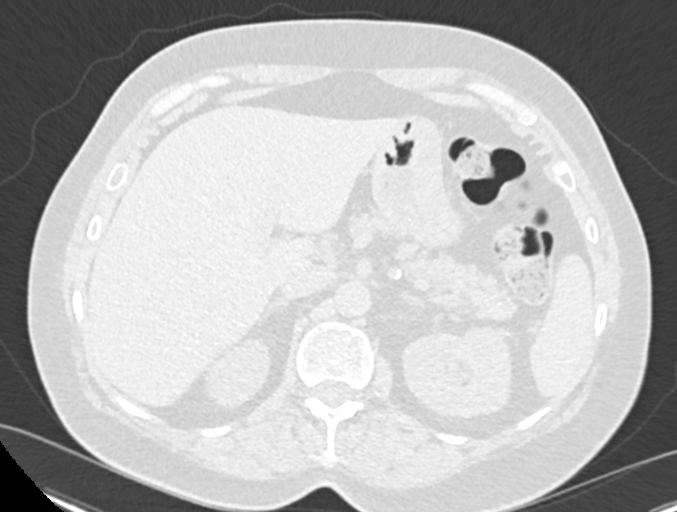
[im 33/153  lung]
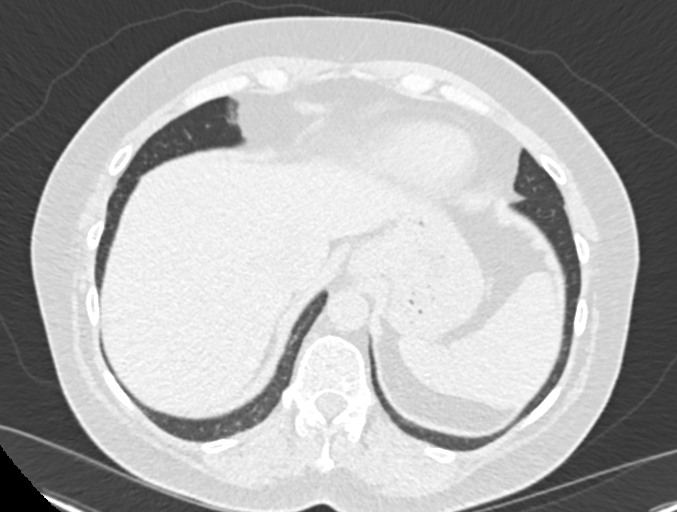
[im 55/153  lung]
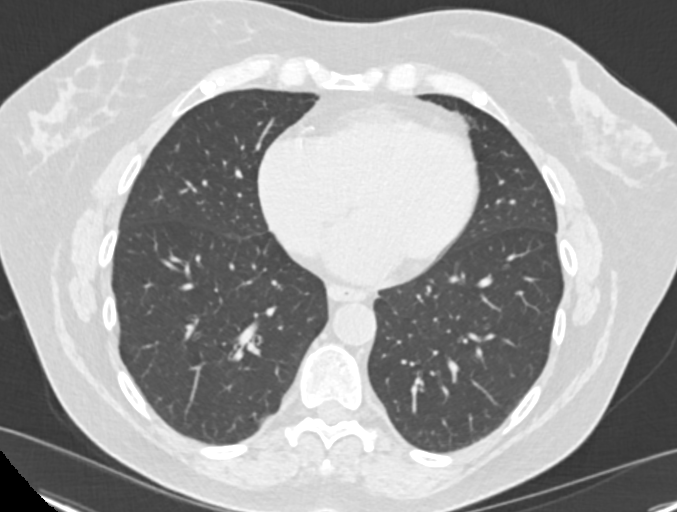
[im 66/153  lung]
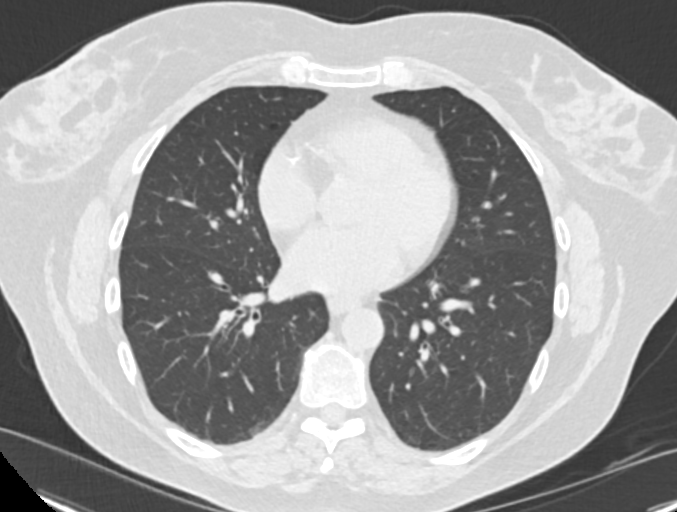
[im 87/153  mediastinal]
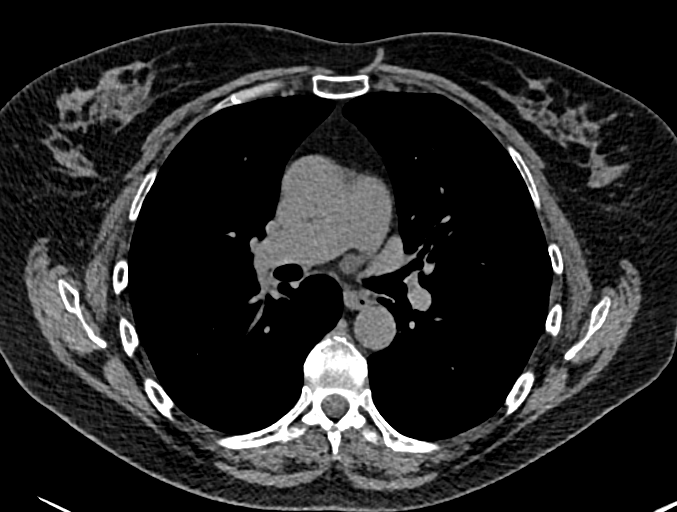
[im 87/153  lung]
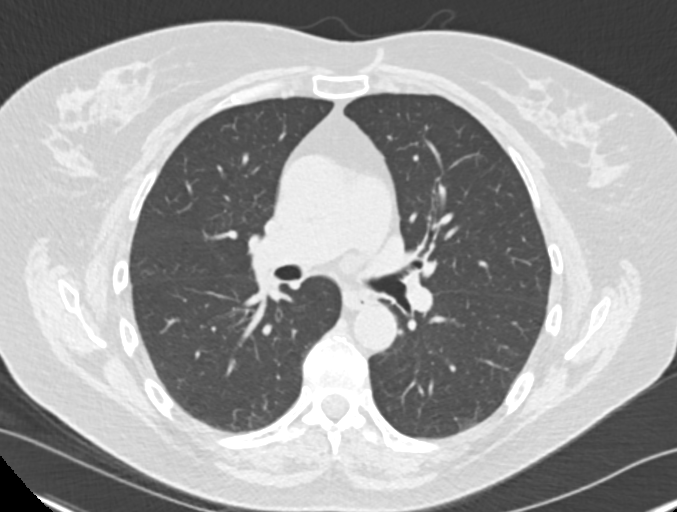
[im 98/153  lung]
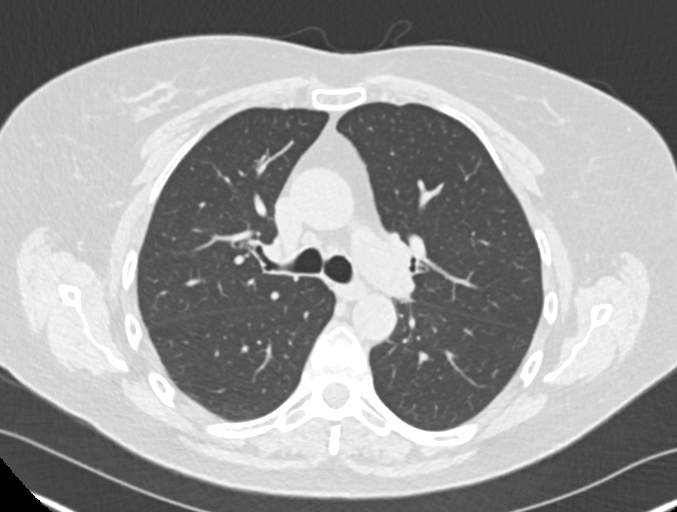
[im 120/153  lung]
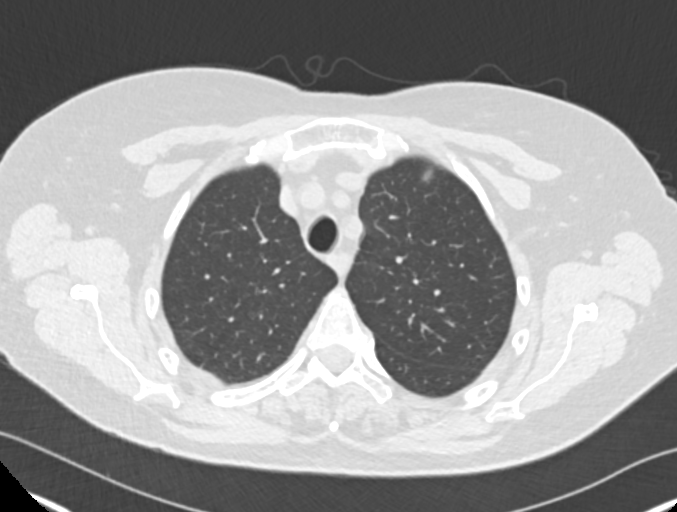
[im 142/153  lung]
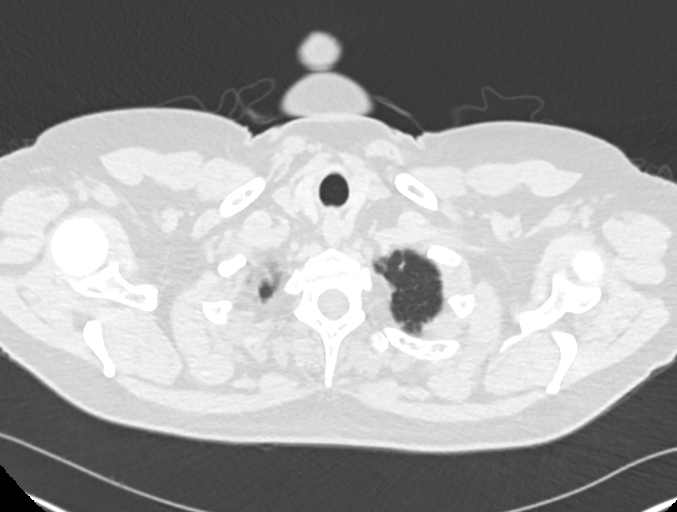

[Series 4: chest 2.00 br40 s3 cor · coronal · 0.60mm/px · 3 of 139 slices shown]
[im 28/139  lung]
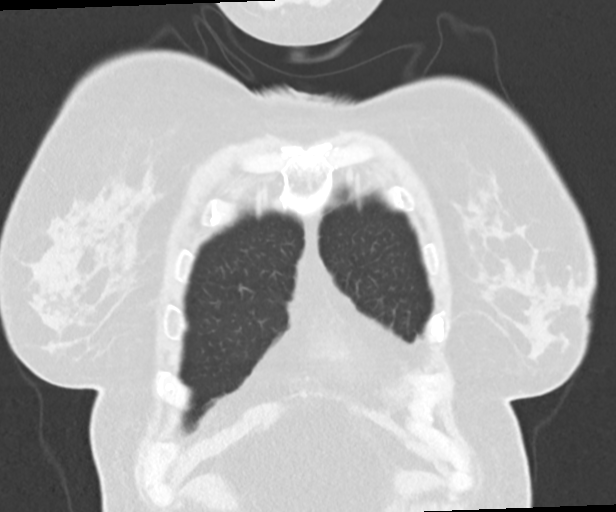
[im 56/139  lung]
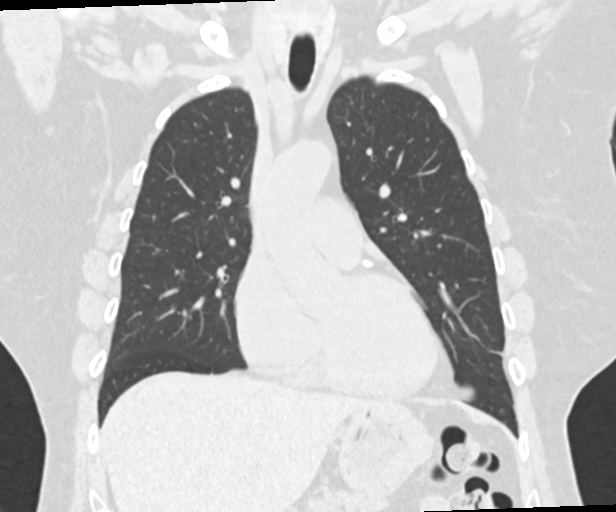
[im 83/139  lung]
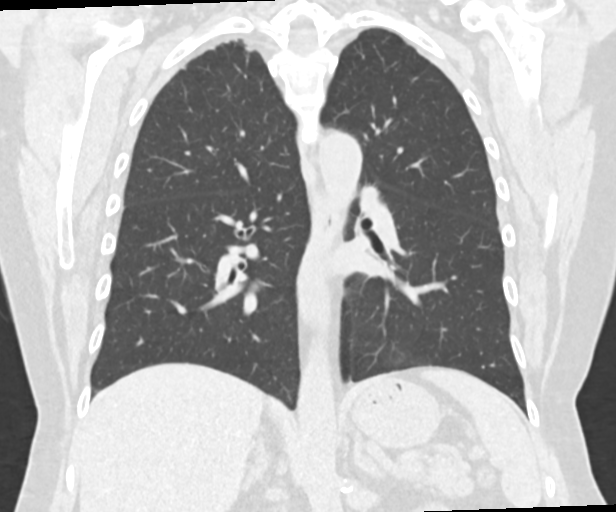

[11 of 36 positions shown; findings below may reference images not displayed]

FINDINGS: Cardiovascular: Significant coronary artery calcifications are
present diffusely but particularly in the distribution of the left
anterior descending coronary artery. The heart is within normal
limits in size. No pericardial effusion is seen. The mid ascending
thoracic aorta measures 35 mm in diameter.

Mediastinum/Nodes: No mediastinal or hilar adenopathy is seen. The
thyroid gland is unremarkable with tiny nodules present. There may
be a small hiatal hernia present.

Lungs/Pleura: A small calcified granuloma is present within the
right lower lobe peripherally peripherally. A small granuloma which
is calcified also is noted in the anterior inferior right upper
lobe. No suspicious lung nodule or mass is currently seen. No
pleural effusion is noted and there is no evidence of parenchymal
infiltrate. The airway appears to be patent..

Upper Abdomen: The upper abdomen is unremarkable other than a
probably heavily calcified splenic artery aneurysm near the splenic
hilum of doubtful significance, which appears stable compared to the
prior CT.. The pancreas is unremarkable.

Musculoskeletal: There are degenerative changes in the mid to lower
thoracic spine. No compression deformity is seen.
IMPRESSION: 1. No parenchymal infiltrate or suspicious lung nodule or mass is
seen. Small calcified granulomas are present on the right consistent
with prior granulomatous disease.
2. Significant coronary artery calcifications.
3. Heavily calcified splenic artery aneurysm of doubtful
significance.

## 2019-01-10 ENCOUNTER — Other Ambulatory Visit: Payer: Self-pay | Admitting: Family Medicine

## 2019-01-11 NOTE — Telephone Encounter (Signed)
See MyChart message patient sent.

## 2019-01-22 ENCOUNTER — Other Ambulatory Visit: Payer: Self-pay | Admitting: Internal Medicine

## 2019-01-23 ENCOUNTER — Other Ambulatory Visit: Payer: Self-pay | Admitting: Family Medicine

## 2019-01-23 MED ORDER — BISOPROLOL FUMARATE 5 MG PO TABS: 5.0000 mg | ORAL_TABLET | Freq: Every day | ORAL | 3 refills | Status: DC

## 2019-02-18 ENCOUNTER — Other Ambulatory Visit: Payer: Self-pay | Admitting: Internal Medicine

## 2019-02-22 ENCOUNTER — Other Ambulatory Visit: Payer: Self-pay

## 2019-02-22 MED ORDER — ROSUVASTATIN CALCIUM 20 MG PO TABS: 20.0000 mg | ORAL_TABLET | Freq: Every day | ORAL | 1 refills | Status: DC

## 2019-02-22 MED ORDER — CLONIDINE HCL 0.1 MG PO TABS: 0.1000 mg | ORAL_TABLET | Freq: Every day | ORAL | 1 refills | Status: DC

## 2019-02-23 ENCOUNTER — Other Ambulatory Visit: Payer: Self-pay

## 2019-04-05 DIAGNOSIS — H26493 Other secondary cataract, bilateral: Secondary | ICD-10-CM | POA: Diagnosis not present

## 2019-05-31 ENCOUNTER — Other Ambulatory Visit: Payer: Self-pay | Admitting: Family Medicine

## 2019-05-31 ENCOUNTER — Telehealth: Payer: BLUE CROSS/BLUE SHIELD | Admitting: Family

## 2019-05-31 DIAGNOSIS — Z20822 Contact with and (suspected) exposure to covid-19: Secondary | ICD-10-CM

## 2019-05-31 DIAGNOSIS — R6889 Other general symptoms and signs: Secondary | ICD-10-CM | POA: Diagnosis not present

## 2019-05-31 MED ORDER — BENZONATATE 100 MG PO CAPS: 100.0000 mg | ORAL_CAPSULE | Freq: Three times a day (TID) | ORAL | 0 refills | Status: DC | PRN

## 2019-05-31 MED ORDER — ALBUTEROL SULFATE HFA 108 (90 BASE) MCG/ACT IN AERS: 2.0000 | INHALATION_SPRAY | RESPIRATORY_TRACT | 2 refills | Status: DC | PRN

## 2019-05-31 NOTE — Progress Notes (Signed)
Greater than 5 minutes, yet less than 10 minutes of time have been spent researching, coordinating, and implementing care for this patient today.  Thank you for the details you included in the comment boxes. Those details are very helpful in determining the best course of treatment for you and help [place] to provide the best care.  E-Visit for Corona Virus Screening   Your current symptoms could be consistent with the coronavirus.  Many health care providers can now test patients at their office but not all are.  [facility] has multiple testing sites. For information on our COVID testing locations and hours go to [email or web address]  Please quarantine yourself while awaiting your test results.  We are enrolling you in our [place] for [place] . Daily you will receive a questionnaire within the [facility] website. Our COVID 19 response team willl be monitoriing your responses daily.    COVID-19 is a respiratory illness with symptoms that are similar to the flu. Symptoms are typically mild to moderate, but there have been cases of severe illness and death due to the virus. The following symptoms may appear 2-14 days after exposure: . Fever . Cough . Shortness of breath or difficulty breathing . Chills . Repeated shaking with chills . Muscle pain . Headache . Sore throat . New loss of taste or smell . Fatigue . Congestion or runny nose . Nausea or vomiting . Diarrhea  It is vitally important that if you feel that you have an infection such as this virus or any other virus that you stay home and away from places where you may spread it to others.  You should self-quarantine for 14 days if you have symptoms that could potentially be coronavirus or have been in close contact a with a person diagnosed with COVID-19 within the last 2 weeks. You should avoid contact with people age 70 and older.   You should wear a mask or cloth face covering over your nose and  mouth if you must be around other people or animals, including pets (even at home). Try to stay at least 6 feet away from other people. This will protect the people around you.  You can use medication such as A prescription cough medication called Tessalon Perles 100 mg. You may take 1-2 capsules every 8 hours as needed for cough and A prescription inhaler called Albuterol MDI 90 mcg /actuation 2 puffs every 4 hours as needed for shortness of breath, wheezing, cough  You may also take acetaminophen (Tylenol) as needed for fever.   Reduce your risk of any infection by using the same precautions used for avoiding the common cold or flu:  Marland Kitchen Wash your hands often with soap and warm water for at least 20 seconds.  If soap and water are not readily available, use an alcohol-based hand sanitizer with at least 60% alcohol.  . If coughing or sneezing, cover your mouth and nose by coughing or sneezing into the elbow areas of your shirt or coat, into a tissue or into your sleeve (not your hands). . Avoid shaking hands with others and consider head nods or verbal greetings only. . Avoid touching your eyes, nose, or mouth with unwashed hands.  . Avoid close contact with people who are sick. . Avoid places or events with large numbers of people in one location, like concerts or sporting events. . Carefully consider travel plans you have or are making. . If you are planning any travel outside or inside the [place],  visit the CDC's Travelers' Health webpage for the latest health notices. . If you have some symptoms but not all symptoms, continue to monitor at home and seek medical attention if your symptoms worsen. . If you are having a medical emergency, call 911.  HOME CARE . Only take medications as instructed by your medical team. . Drink plenty of fluids and get plenty of rest. . A steam or ultrasonic humidifier can help if you have congestion.   GET HELP RIGHT AWAY IF YOU HAVE EMERGENCY WARNING SIGNS** FOR  COVID-19. If you or someone is showing any of these signs seek emergency medical care immediately. Call 911 or proceed to your closest emergency facility if: . You develop worsening high fever. . Trouble breathing . Bluish lips or face . Persistent pain or pressure in the chest . New confusion . Inability to wake or stay awake . You cough up blood. . Your symptoms become more severe  **This list is not all possible symptoms. Contact your medical provider for any symptoms that are sever or concerning to you.   MAKE SURE YOU   Understand these instructions.  Will watch your condition.  Will get help right away if you are not doing well or get worse.  Your e-visit answers were reviewed by a board certified advanced clinical practitioner to complete your personal care plan.  Depending on the condition, your plan could have included both over the counter or prescription medications.  If there is a problem please reply once you have received a response from your provider.  Your safety is important to [place].  If you have drug allergies check your prescription carefully.    You can use MyChart to ask questions about today's visit, request a non-urgent call back, or ask for a work or school excuse for 24 hours related to this e-Visit. If it has been greater than 24 hours you will need to follow up with your provider, or enter a new e-Visit to address those concerns. You will get an e-mail in the next two days asking about your experience.  I hope that your e-visit has been valuable and will speed your recovery. Thank you for using e-visits.

## 2019-06-04 LAB — SPECIMEN STATUS REPORT

## 2019-06-04 LAB — NOVEL CORONAVIRUS, NAA: SARS-CoV-2, NAA: NOT DETECTED

## 2019-06-12 NOTE — Telephone Encounter (Signed)
Please provide copy to patient as requested.

## 2019-07-02 DIAGNOSIS — D225 Melanocytic nevi of trunk: Secondary | ICD-10-CM | POA: Diagnosis not present

## 2019-07-02 DIAGNOSIS — R21 Rash and other nonspecific skin eruption: Secondary | ICD-10-CM | POA: Diagnosis not present

## 2019-07-02 DIAGNOSIS — D485 Neoplasm of uncertain behavior of skin: Secondary | ICD-10-CM | POA: Diagnosis not present

## 2019-08-21 ENCOUNTER — Other Ambulatory Visit: Payer: Self-pay | Admitting: Cardiology

## 2019-08-21 ENCOUNTER — Other Ambulatory Visit: Payer: Self-pay | Admitting: Thoracic Surgery (Cardiothoracic Vascular Surgery)

## 2019-08-21 ENCOUNTER — Encounter: Payer: Self-pay | Admitting: Family Medicine

## 2019-08-21 ENCOUNTER — Ambulatory Visit (INDEPENDENT_AMBULATORY_CARE_PROVIDER_SITE_OTHER): Payer: BC Managed Care – PPO | Admitting: Family Medicine

## 2019-08-21 VITALS — Temp 99.2°F | Ht 64.75 in

## 2019-08-21 DIAGNOSIS — H9201 Otalgia, right ear: Secondary | ICD-10-CM | POA: Diagnosis not present

## 2019-08-21 DIAGNOSIS — R918 Other nonspecific abnormal finding of lung field: Secondary | ICD-10-CM

## 2019-08-21 MED ORDER — AMOXICILLIN 500 MG PO CAPS: 1000.0000 mg | ORAL_CAPSULE | Freq: Two times a day (BID) | ORAL | 0 refills | Status: DC

## 2019-08-21 MED ORDER — PREDNISONE 10 MG PO TABS: ORAL_TABLET | ORAL | 0 refills | Status: DC

## 2019-08-21 NOTE — Progress Notes (Signed)
VIRTUAL VISIT Due to national recommendations of social distancing due to [place] 19, a virtual visit is felt to be most appropriate for this patient at this time.   I connected with the patient on 08/21/19 at  9:40 AM EDT by virtual telehealth platform and verified that I am speaking with the correct person using two identifiers.   I discussed the limitations, risks, security and privacy concerns of performing an evaluation and management service by  virtual telehealth platform and the availability of in person appointments. I also discussed with the patient that there may be a patient responsible charge related to this service. The patient expressed understanding and agreed to proceed.  Patient location: Home Provider Location: [facility] Participants: Eliezer Lofts and Renaldo Harrison   Chief Complaint  Patient presents with  . Ear Pain    Feels stopped up with fluid  . Fever    low grade x 10 days    History of Present Illness: Fever  This is a new problem. The current episode started in the past 7 days ( 2 weeks ago started with cold symptoms no new onset pain in right ear7-8 days). The problem occurs daily. The problem has been gradually worsening. Associated symptoms include congestion and ear pain. Pertinent negatives include no coughing or sore throat. Associated symptoms comments: Ear fullness and pain in right ear. Mild runny nose in Ams clear, no SOB, no wheeze..  Otalgia  There is pain in the right ear. This is a new problem. The current episode started in the past 7 days. Maximum temperature: low grade temp. Pertinent negatives include no coughing or sore throat. Treatments tried:  dayquil, flonase 2 sprays. The treatment provided mild relief. There is no history of a chronic ear infection, hearing loss or a tympanostomy tube.     COVID 19 screen No recent travel or known exposure to [place]  The importance of social distancing was discussed today.   Review of  Systems  Constitutional: Positive for fever.  HENT: Positive for congestion and ear pain. Negative for sore throat.   Respiratory: Negative for cough.       Past Medical History:  Diagnosis Date  . Allergy   . Anxiety    patient denies  . Asthma   . Cataract   . GERD (gastroesophageal reflux disease)   . Glaucoma   . History of echocardiogram    a. 11/2015: EF 55-60%, no RWMA, nl LV diastolic fxn, PASP nl  . Hypertension   . Obesity   . PAF (paroxysmal atrial fibrillation) ([place])    a. CHADS2VASc = 2 (HTN, sex category)  . Sinusitis 2011   Recurrent per Dr. Tami Ribas with [facility]    reports that she has never smoked. She has never used smokeless tobacco. She reports current alcohol use of about 14.0 standard drinks of alcohol per week. She reports that she does not use drugs.   Current Outpatient Medications:  .  albuterol (VENTOLIN HFA) 108 (90 Base) MCG/ACT inhaler, Inhale 2 puffs into lungs every 6 hours as needed for wheezing., Disp: 18 g, Rfl: 5 .  albuterol (VENTOLIN HFA) 108 (90 Base) MCG/ACT inhaler, Inhale 2 puffs into the lungs every 4 (four) hours as needed for wheezing or shortness of breath., Disp: 8 g, Rfl: 2 .  apixaban (ELIQUIS) 5 MG TABS tablet, Take 1 tablet (5 mg total) by mouth 2 (two) times daily., Disp: 60 tablet, Rfl: 11 .  bisoprolol (ZEBETA) 5 MG tablet, Take  1 tablet (5 mg total) by mouth daily., Disp: 90 tablet, Rfl: 3 .  cloNIDine (CATAPRES) 0.1 MG tablet, TAKE 1 TABLET (0.1 MG TOTAL) BY MOUTH AT BEDTIME., Disp: 90 tablet, Rfl: 1 .  diltiazem (CARDIZEM CD) 240 MG 24 hr capsule, Take 1 capsule (240 mg total) by mouth daily., Disp: 30 capsule, Rfl: 11 .  fluticasone (FLONASE) 50 MCG/ACT nasal spray, Place 1 spray into both nostrils daily as needed for allergies., Disp: 16 g, Rfl: 11 .  levocetirizine (XYZAL) 5 MG tablet, Take 5 mg by mouth at bedtime. , Disp: , Rfl:  .  montelukast (SINGULAIR) 10 MG tablet, TAKE 1 TABLET (10 MG TOTAL) BY MOUTH AT  BEDTIME., Disp: 90 tablet, Rfl: 3 .  pantoprazole (PROTONIX) 40 MG tablet, TAKE 1 TAB BY MOUTH 30- 60 MIN BEFORE YOUR FIRST AND LAST MEALS OF THE DAY, Disp: 180 tablet, Rfl: 1 .  rosuvastatin (CRESTOR) 20 MG tablet, TAKE 1 TABLET BY MOUTH EVERY DAY, Disp: 90 tablet, Rfl: 1 .  spironolactone (ALDACTONE) 50 MG tablet, Take 1 tablet (50 mg total) by mouth daily., Disp: 30 tablet, Rfl: 11 .  hydrALAZINE (APRESOLINE) 25 MG tablet, Take 1 tablet (25 mg total) by mouth 2 (two) times daily., Disp: 60 tablet, Rfl: 11   Observations/Objective: Temperature 99.2 F (37.3 C), temperature source Temporal, height 5' 4.75" (1.645 m).  Physical Exam  Physical Exam Constitutional:      General: The patient is not in acute distress. Pulmonary:     Effort: Pulmonary effort is normal. No respiratory distress.  Neurological:     Mental Status: The patient is alert and oriented to person, place, and time.  Psychiatric:        Mood and Affect: Mood normal.        Behavior: Behavior normal.   Assessment and Plan Acute ear pain, right  Following recent viral infeciton... unclear if ETD vs bacterial infection ( pt does have low grade temp 99.26F)... will try trial of prednisone for ETD ( already using flonase) If symptoms not improving will fill antibiotics for possible bacterial superinfeciton.  Recommended consideration of COVID testing and home isolation. Pt will consider.     I discussed the assessment and treatment plan with the patient. The patient was provided an opportunity to ask questions and all were answered. The patient agreed with the plan and demonstrated an understanding of the instructions.   The patient was advised to call back or seek an in-person evaluation if the symptoms worsen or if the condition fails to improve as anticipated.     Eliezer Lofts, MD

## 2019-08-21 NOTE — Assessment & Plan Note (Signed)
Following recent viral infeciton... unclear if ETD vs bacterial infection ( pt does have low grade temp 99.72F)... will try trial of prednisone for ETD ( already using flonase) If symptoms not improving will fill antibiotics for possible bacterial superinfeciton.  Recommended consideration of COVID testing and home isolation. Pt will consider.

## 2019-08-21 NOTE — Progress Notes (Unsigned)
c 

## 2019-09-26 ENCOUNTER — Ambulatory Visit
Admission: RE | Admit: 2019-09-26 | Discharge: 2019-09-26 | Disposition: A | Discharge status: Home / Self Care | Payer: BC Managed Care – PPO | Source: Ambulatory Visit | Attending: Thoracic Surgery (Cardiothoracic Vascular Surgery) | Admitting: Thoracic Surgery (Cardiothoracic Vascular Surgery)

## 2019-09-26 DIAGNOSIS — R918 Other nonspecific abnormal finding of lung field: Secondary | ICD-10-CM

## 2019-09-26 DIAGNOSIS — R911 Solitary pulmonary nodule: Secondary | ICD-10-CM | POA: Diagnosis not present

## 2019-10-02 ENCOUNTER — Other Ambulatory Visit: Payer: Self-pay

## 2019-10-02 ENCOUNTER — Encounter: Payer: Self-pay | Admitting: Thoracic Surgery (Cardiothoracic Vascular Surgery)

## 2019-10-02 ENCOUNTER — Ambulatory Visit: Payer: BC Managed Care – PPO | Admitting: Thoracic Surgery (Cardiothoracic Vascular Surgery)

## 2019-10-02 VITALS — BP 146/73 | HR 66 | Temp 97.9°F | Resp 16 | Ht 64.75 in | Wt 160.0 lb

## 2019-10-02 DIAGNOSIS — C3490 Malignant neoplasm of unspecified part of unspecified bronchus or lung: Secondary | ICD-10-CM | POA: Diagnosis not present

## 2019-10-02 DIAGNOSIS — Z09 Encounter for follow-up examination after completed treatment for conditions other than malignant neoplasm: Secondary | ICD-10-CM | POA: Diagnosis not present

## 2019-10-02 NOTE — Progress Notes (Signed)
[address]       [place] [address]             [phone]     HPI: [name] returns for a scheduled follow-up visit  Amanda Estrada is a 58 year old woman with a past history of hypertension, reflux, paroxysmal atrial fibrillation, and benign bronchial papillary adenoma.  She presented with persistent coughing, wheezing, and intermittent hemoptysis in 2018.  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  I did a endobronchial resection and laser ablation on 09/19/2017.  Last saw in the office a year ago.  She was doing well at that time with no evidence of recurrence.  In the interim since her last visit she has continued to do well.  She does still have occasional wheezing.  She does have to use her albuterol at times during the spring and fall during allergy season.  She has not had any unusual cough or hemoptysis.  Past Medical History:  Diagnosis Date  . Allergy   . Anxiety    patient denies  . Asthma   . Cataract   . GERD (gastroesophageal reflux disease)   . Glaucoma   . History of echocardiogram    a. 11/2015: EF 55-60%, no RWMA, nl LV diastolic fxn, PASP nl  . Hypertension   . Obesity   . PAF (paroxysmal atrial fibrillation) ([place])    a. CHADS2VASc = 2 (HTN, sex category)  . Sinusitis 2011   Recurrent per Dr. Tami Ribas with [facility]    Current Outpatient Medications  Medication Sig Dispense Refill  . albuterol (VENTOLIN HFA) 108 (90 Base) MCG/ACT inhaler Inhale 2 puffs into lungs every 6 hours as needed for wheezing. 18 g 5  . albuterol (VENTOLIN HFA) 108 (90 Base) MCG/ACT inhaler Inhale 2 puffs into the lungs every 4 (four) hours as needed for wheezing or shortness of breath. 8 g 2  . apixaban (ELIQUIS) 5 MG TABS tablet Take 1 tablet (5 mg total) by mouth 2 (two) times daily. 60 tablet 11  . bisoprolol (ZEBETA) 5 MG tablet Take 1 tablet (5 mg total) by mouth daily. 90 tablet 3  .  cloNIDine (CATAPRES) 0.1 MG tablet TAKE 1 TABLET (0.1 MG TOTAL) BY MOUTH AT BEDTIME. 90 tablet 1  . diltiazem (CARDIZEM CD) 240 MG 24 hr capsule Take 1 capsule (240 mg total) by mouth daily. 30 capsule 11  . fluticasone (FLONASE) 50 MCG/ACT nasal spray Place 1 spray into both nostrils daily as needed for allergies. 16 g 11  . hydrALAZINE (APRESOLINE) 25 MG tablet Take 1 tablet (25 mg total) by mouth 2 (two) times daily. 60 tablet 11  . levocetirizine (XYZAL) 5 MG tablet Take 5 mg by mouth at bedtime.     . montelukast (SINGULAIR) 10 MG tablet TAKE 1 TABLET (10 MG TOTAL) BY MOUTH AT BEDTIME. 90 tablet 3  . omeprazole (PRILOSEC) 20 MG capsule Take 20 mg by mouth daily. X 2 capsules    . rosuvastatin (CRESTOR) 20 MG tablet TAKE 1 TABLET BY MOUTH EVERY DAY 90 tablet 1  . spironolactone (ALDACTONE) 50 MG tablet Take 1 tablet (50 mg total) by mouth daily. 30 tablet 11   No current facility-administered medications for this visit.     Physical Exam BP (!) 146/73 (BP Location: Right Arm, Patient Position: Sitting, Cuff Size: Normal)   Pulse 66   Temp 97.9 F (36.6 C)   Resp  16   Ht 5' 4.75" (1.645 m)   Wt 160 lb (72.6 kg)   SpO2 96% Comment: RA  BMI 26.7 kg/m  58 year old woman in no acute distress Alert and oriented x3 with no focal deficits Cardiac regular rate and rhythm normal S1 and S2 Lungs clear with equal breath sounds bilaterally, no wheezing  Diagnostic Tests: CT CHEST WITHOUT CONTRAST  TECHNIQUE: Multidetector CT imaging of the chest was performed following the standard protocol without IV contrast.  COMPARISON:  [facility] [id number]  FINDINGS: Cardiovascular: Normal heart size. No pericardial effusion. Coronary artery calcifications.  Mediastinum/Nodes: No enlarged mediastinal or axillary lymph nodes. Thyroid gland, trachea, and esophagus demonstrate no significant findings.  Lungs/Pleura: Central airways are patent. Mild chronic bronchial wall thickening. No  consolidation. Minimal punctate calcified granulomas noted. No new nodule or mass.  Upper Abdomen: No acute abnormality.  Musculoskeletal: No acute abnormality.  IMPRESSION: No new lung nodule or mass.   Electronically Signed   By: Macy Mis M.D.   On: 09/26/2019 12:42 I personally reviewed the CT images and concur with the findings noted above  Impression: Amanda Estrada is a 58 year old woman with a history of hypertension, reflux, and atrial fibrillation.  She presented in 2018 with a 1 year history of wheezing, cough, and intermittent hemoptysis.  She had a bronchoscopy which revealed an endobronchial mass lesion.  I did an endobronchial resection and laser ablation in November 2018.  She is now 2 years out from the procedure with no evidence of recurrent disease.  Given that this was a completely benign lesion, and there is no signs of any recurrence at 2 years on CT, I do not think additional follow-up is warranted.  She knows to contact me if she has any recurrence of persistent cough or hemoptysis.  Plan:  I will be happy to see [name] back again anytime in the future if I can be of any further assistance with her care  Melrose Nakayama, MD [facility] [phone]

## 2019-10-11 DIAGNOSIS — H26493 Other secondary cataract, bilateral: Secondary | ICD-10-CM | POA: Diagnosis not present

## 2019-10-16 ENCOUNTER — Encounter: Payer: Self-pay | Admitting: Primary Care

## 2019-10-16 ENCOUNTER — Other Ambulatory Visit (INDEPENDENT_AMBULATORY_CARE_PROVIDER_SITE_OTHER): Payer: BC Managed Care – PPO

## 2019-10-16 ENCOUNTER — Telehealth: Payer: Self-pay

## 2019-10-16 ENCOUNTER — Ambulatory Visit (INDEPENDENT_AMBULATORY_CARE_PROVIDER_SITE_OTHER): Payer: BC Managed Care – PPO | Admitting: Primary Care

## 2019-10-16 VITALS — BP 144/82 | HR 68 | Temp 97.2°F

## 2019-10-16 DIAGNOSIS — R197 Diarrhea, unspecified: Secondary | ICD-10-CM

## 2019-10-16 LAB — CBC WITH DIFFERENTIAL/PLATELET
Basophils Absolute: 0 10*3/uL (ref 0.0–0.1)
Basophils Relative: 0.5 % (ref 0.0–3.0)
Eosinophils Absolute: 0.1 10*3/uL (ref 0.0–0.7)
Eosinophils Relative: 1.6 % (ref 0.0–5.0)
HCT: 38.7 % (ref 36.0–46.0)
Hemoglobin: 12.8 g/dL (ref 12.0–15.0)
Lymphocytes Relative: 25.9 % (ref 12.0–46.0)
Lymphs Abs: 1.6 10*3/uL (ref 0.7–4.0)
MCHC: 33.2 g/dL (ref 30.0–36.0)
MCV: 96 fl (ref 78.0–100.0)
Monocytes Absolute: 0.6 10*3/uL (ref 0.1–1.0)
Monocytes Relative: 9.3 % (ref 3.0–12.0)
Neutro Abs: 3.9 10*3/uL (ref 1.4–7.7)
Neutrophils Relative %: 62.7 % (ref 43.0–77.0)
Platelets: 242 10*3/uL (ref 150.0–400.0)
RBC: 4.03 Mil/uL (ref 3.87–5.11)
RDW: 12.6 % (ref 11.5–15.5)
WBC: 6.2 10*3/uL (ref 4.0–10.5)

## 2019-10-16 LAB — COMPREHENSIVE METABOLIC PANEL
ALT: 15 U/L (ref 0–35)
AST: 14 U/L (ref 0–37)
Albumin: 4.4 g/dL (ref 3.5–5.2)
Alkaline Phosphatase: 62 U/L (ref 39–117)
BUN: 9 mg/dL (ref 6–23)
CO2: 24 mEq/L (ref 19–32)
Calcium: 9.5 mg/dL (ref 8.4–10.5)
Chloride: 98 mEq/L (ref 96–112)
Creatinine, Ser: 0.67 mg/dL (ref 0.40–1.20)
GFR: 90.06 mL/min (ref >60.00)
Glucose, Bld: 97 mg/dL (ref 70–99)
Potassium: 4.1 mEq/L (ref 3.5–5.1)
Sodium: 131 mEq/L — ABNORMAL LOW (ref 135–145)
Total Bilirubin: 0.5 mg/dL (ref 0.2–1.2)
Total Protein: 6.6 g/dL (ref 6.0–8.3)

## 2019-10-16 NOTE — Assessment & Plan Note (Signed)
Acute diarrhea for two weeks, worsening. Today she appears well, not sickly. No other symptoms of Covid-19 so this is unlikely the cause. No recent antibiotic use. No evidence of diverticulosis on colonoscopy from 2020.  Check labs today including CMP, CBC, GI stool studies. Discussed the importance of hydration with water. Continue Imodium for now given chronicity of symptoms. She appears stable for outpatient treatment, await lab results.  Strict ED precautions provided.

## 2019-10-16 NOTE — Telephone Encounter (Signed)
[facility] Night - Client TELEPHONE ADVICE RECORD [facility] Patient Name: Amanda Estrada Gender: Female DOB: Apr 06, 1961 Age: 58 Y 28 D Return Phone Number: [phone] (Primary), [phone] (Secondary) [place] [address] Client [facility] Night - Client Client Site [place] Physician Eliezer Lofts - MD Contact Type Call Who Is Calling Patient / Member / Family / Caregiver Call Type Triage / Clinical Relationship To Patient Self Return Phone Number [phone] (Primary) Chief Complaint Diarrhea Reason for Call Request to Schedule Office Appointment Initial Comment Caller states they would like to make an appointment. Caller states they have diarrhea real bad since last Wednesday. State they are almost dehydrated. States they have had urine output. Translation No Nurse Assessment Nurse: Jimmye Norman, RN, [place] Date/Time (Eastern Time): 10/16/2019 7:25:22 AM Confirm and document reason for call. If symptomatic, describe symptoms. ---Caller states they would like to make an appointment. Caller states they have diarrhea since last Wednesday. States she is almost dehydrated. Urinated just now. Has the patient had close contact with a person known or suspected to have the novel coronavirus illness OR traveled / lives in area with major community spread (including international travel) in the last 14 days from the onset of symptoms? * If Asymptomatic, screen for exposure and travel within the last 14 days. ---No Does the patient have any new or worsening symptoms? ---Yes Will a triage be completed? ---Yes Related visit to physician within the last 2 weeks? ---No Does the PT have any chronic conditions? (i.e. diabetes, asthma, this includes High risk factors for pregnancy, etc.) ---No Is this a behavioral health or substance abuse call? ---No Guidelines Guideline Title  Affirmed Question Affirmed Notes Nurse Date/Time (Eastern Time) Diarrhea [1] SEVERE diarrhea (e.g., 7 or more times / day more than normal) AND [2] present > 24 hours (1 day) Jimmye Norman, RN, Whitney 10/16/2019 7:26:11 AM Disp. Time Eilene Ghazi Time) Disposition Final User PLEASE NOTE: All timestamps contained within this report are represented as [place] Standard Time. CONFIDENTIALTY NOTICE: This fax transmission is intended only for the addressee. It contains information that is legally privileged, confidential or otherwise protected from use or disclosure. If you are not the intended recipient, you are strictly prohibited from reviewing, disclosing, copying using or disseminating any of this information or taking any action in reliance on or regarding this information. If you have received this fax in error, please notify us immediately by telephone so that we can arrange for its return to [place]. Phone: [phone], Toll-Free: [phone], Fax: [phone] Page: 2 of 2 Call Id: [id number] 10/16/2019 7:28:46 AM See PCP within 24 Hours Yes Jimmye Norman, RN, Loree Fee Caller Disagree/Comply Comply Caller Understands Yes PreDisposition InappropriateToAsk Care Advice Given Per Guideline SEE PCP WITHIN 24 HOURS: * IF OFFICE WILL BE OPEN: You need to be seen within the next 24 hours. Call your doctor (or NP/PA) when the office opens and make an appointment. CALL BACK IF: * You become worse. FLUID THERAPY DURING SEVERE DIARRHEA: * WATER: Even for severe diarrhea, water is often the best liquid to drink. You should also eat some salty foods (e.g., potato chips, pretzels, saltine crackers). This is important to make sure you are getting enough salt, sugars, and fluids to meet your body's needs. Referrals REFERRED TO PCP OFFICE

## 2019-10-16 NOTE — Progress Notes (Signed)
Per 10/16/2019 encounter order on 10/16/2019, patient presents today for a  blood pressure check for ongoing follow up and management.    Vital Sign Readings today BP 144/82 P 68

## 2019-10-16 NOTE — Progress Notes (Signed)
Subjective:    Patient ID: Amanda Estrada, female    DOB: 10-06-1961, 58 y.o.   MRN: [id number]  HPI  Virtual Visit via Video Note  I connected with Amanda Estrada on 10/16/19 at  8:40 AM EST by a video enabled telemedicine application and verified that I am speaking with the correct person using two identifiers.  Location: Patient: Home Provider: Office   I discussed the limitations of evaluation and management by telemedicine and the availability of in person appointments. The patient expressed understanding and agreed to proceed.  History of Present Illness:  Amanda Estrada is a 58 year old female with a history of hypertension, atrial fibrillation, CAD, OSA, asthma, GERD, situational anxiety who presents today with a chief complaint of diarrhea.  Her diarrhea began on Thanksgiving day with mild diarrhea which has slowly progressed over the last 6 days. She's now having 10+ episodes of diarrhea during the day and evening, worse episodes with beef. She's eating very little. She will have an episode of diarrhea within 30 minutes of eating and drinking anything.   She's trying to stay hydrated with Gatorade but is still having diarrhea shortly after drinking. Also with bilateral upper abdominal cramping, decrease in appetite, fatigue/weak. She denies bloody stools, cough, fevers, loss of taste/smell, vomiting, recent antibiotic use, changes in food/medications, lower abdominal pain. No one in her household has these symptoms.   She started taking a probiotic last night. She's also taken a box of Imodium over the last week with temporary improvement. She underwent colonoscopy in January 2020 which was normal, no evidence of diverticulosis.    Observations/Objective:  Alert and oriented. Appears well, not sickly. No distress. Speaking in complete sentences.   Assessment and Plan:  Acute diarrhea for two weeks, worsening. Today she appears well, not sickly. No other symptoms  of Covid-19 so this is unlikely the cause. No recent antibiotic use. No evidence of diverticulosis on colonoscopy from 2020.  Check labs today including CMP, CBC, GI stool studies. Discussed the importance of hydration with water. Continue Imodium for now given chronicity of symptoms. She appears stable for outpatient treatment, await lab results.  Strict ED precautions provided.   Follow Up Instructions:  Please have your labs done and complete the stool kit as soon as possible.  Ensure you are consuming 64 ounces of water daily.  Please notify me if you develop abdominal pain, bloody stools, start to run fevers, feel worse.  It was a pleasure to see you today! Allie Bossier, NP-C    I discussed the assessment and treatment plan with the patient. The patient was provided an opportunity to ask questions and all were answered. The patient agreed with the plan and demonstrated an understanding of the instructions.   The patient was advised to call back or seek an in-person evaluation if the symptoms worsen or if the condition fails to improve as anticipated.  This visit occurred during the SARS-CoV-2 public health emergency.  Safety protocols were in place, including screening questions prior to the visit, additional usage of staff PPE, and extensive cleaning of exam room while observing appropriate contact time as indicated for disinfecting solutions.     Pleas Koch, NP    Review of Systems  Constitutional: Positive for fatigue. Negative for chills and fever.  HENT: Negative for sore throat.   Respiratory: Negative for cough and shortness of breath.   Gastrointestinal: Positive for diarrhea. Negative for blood in stool, nausea and vomiting.  Abdominal cramping       Past Medical History:  Diagnosis Date  . Allergy   . Anxiety    patient denies  . Asthma   . Cataract   . GERD (gastroesophageal reflux disease)   . Glaucoma   . History of echocardiogram    a.  11/2015: EF 55-60%, no RWMA, nl LV diastolic fxn, PASP nl  . Hypertension   . Obesity   . PAF (paroxysmal atrial fibrillation) ([place])    a. CHADS2VASc = 2 (HTN, sex category)  . Sinusitis 2011   Recurrent per Dr. Tami Ribas with [facility]     Social History   Socioeconomic History  . Marital status: Married    Spouse name: Not on file  . Number of children: 2  . Years of education: Not on file  . Highest education level: Not on file  Occupational History  . 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 Financial resource strain: Not on file  . Food insecurity    Worry: Not on file    Inability: Not on file  . Transportation needs    Medical: Not on file    Non-medical: Not on file  Tobacco Use  . Smoking status: Never Smoker  . Smokeless tobacco: Never Used  Substance and Sexual Activity  . Alcohol use: Yes    Alcohol/week: 14.0 standard drinks    Types: 14 Glasses of wine per week    Comment: 2 glasses of wine every day  . Drug use: No  . Sexual activity: Not on file  Lifestyle  . Physical activity    Days per week: Not on file    Minutes per session: Not on file  . Stress: Not on file  Relationships  . Social Herbalist on phone: Not on file    Gets together: Not on file    Attends religious service: Not on file    Active member of club or organization: Not on file    Attends meetings of clubs or organizations: Not on file    Relationship status: Not on file  . Intimate partner violence    Fear of current or ex partner: Not on file    Emotionally abused: Not on file    Physically abused: Not on file    Forced sexual activity: Not on file  Other Topics Concern  . Not on file  Social History Narrative   No regular exercise, used to but unable to do given chronic cough.    Past Surgical History:  Procedure Laterality Date  . BUNIONECTOMY  11/2007   Right foot, 2 hammer-toes and tendon  replacement (Dr. Paulla Dolly)  . cataract Bilateral 06/2018   narrow angle glaucoma bilateral  . CERVICAL DISCECTOMY  2001   C5/6  . CESAREAN SECTION  1985   Due to incr BP  . COLONOSCOPY    . ENDOMETRIAL BIOPSY  03/27/2008   B9 endometrium w/breakdown changes (Dr, Kinscius)  . LASER BRONCHOSCOPY N/A 09/19/2017   Procedure: LASER BRONCHOSCOPY;  Surgeon: Melrose Nakayama, MD;  Location: [facility] OR;  Service: Thoracic;  Laterality: N/A;  . Lasix eye surgery    . LUNG SURGERY Left 09/19/2017   benign tumor removed  . NSVD  1989   VBAC  . Renal artery [place]  02/2007   [place] arteries but no stenosis  . TIBIA FRACTURE SURGERY Left 12/2017  . US TRANSVAGINAL PELVIC MODIFIED  04/09/2008   Normal  . VIDEO BRONCHOSCOPY Bilateral 08/10/2017   Procedure: VIDEO BRONCHOSCOPY WITHOUT FLUORO;  Surgeon: Tanda Rockers, MD;  Location: [facility];  Service: Cardiopulmonary;  Laterality: Bilateral;  . VIDEO BRONCHOSCOPY N/A 09/19/2017   Procedure: VIDEO BRONCHOSCOPY, with endobronchial tumor resection;  Surgeon: Melrose Nakayama, MD;  Location: [place];  Service: Thoracic;  Laterality: N/A;  . VIDEO BRONCHOSCOPY Bilateral 08/16/2018   Procedure: VIDEO BRONCHOSCOPY WITHOUT FLUORO;  Surgeon: Tanda Rockers, MD;  Location: [facility];  Service: Cardiopulmonary;  Laterality: Bilateral;    Family History  Problem Relation Age of Onset  . Cancer - Colon Mother 65       partial colectomy  . Melanoma Mother        x2  . Colon cancer Mother   . Heart disease Father 60       CHF  . Hypertension Father   . Pneumonia Father   . Colon cancer Father 8  . Colon polyps Sister   . Hypertension Brother   . Colon polyps Brother   . Hypertension Sister   . Colon polyps Sister   . Colon polyps Sister   . Esophageal cancer Neg Hx   . Rectal cancer Neg Hx   . Stomach cancer Neg Hx     Allergies  Allergen Reactions  . Amoxicillin-Pot Clavulanate Other (See Comments)    Diarrhea Has patient had a PCN  reaction causing immediate rash, facial/tongue/throat swelling, SOB or lightheadedness with hypotension: No Has patient had a PCN reaction causing severe rash involving mucus membranes or skin necrosis: No Has patient had a PCN reaction that required hospitalization: No Has patient had a PCN reaction occurring within the last 10 years: Yes--diarrhea ONLY If all of the above answers are "NO", then may proceed with Cephalosporin use.     Current Outpatient Medications on File Prior to Visit  Medication Sig Dispense Refill  . albuterol (VENTOLIN HFA) 108 (90 Base) MCG/ACT inhaler Inhale 2 puffs into lungs every 6 hours as needed for wheezing. 18 g 5  . albuterol (VENTOLIN HFA) 108 (90 Base) MCG/ACT inhaler Inhale 2 puffs into the lungs every 4 (four) hours as needed for wheezing or shortness of breath. 8 g 2  . apixaban (ELIQUIS) 5 MG TABS tablet Take 1 tablet (5 mg total) by mouth 2 (two) times daily. 60 tablet 11  . bisoprolol (ZEBETA) 5 MG tablet Take 1 tablet (5 mg total) by mouth daily. 90 tablet 3  . cloNIDine (CATAPRES) 0.1 MG tablet TAKE 1 TABLET (0.1 MG TOTAL) BY MOUTH AT BEDTIME. 90 tablet 1  . diltiazem (CARDIZEM CD) 240 MG 24 hr capsule Take 1 capsule (240 mg total) by mouth daily. 30 capsule 11  . fluticasone (FLONASE) 50 MCG/ACT nasal spray Place 1 spray into both nostrils daily as needed for allergies. 16 g 11  . levocetirizine (XYZAL) 5 MG tablet Take 5 mg by mouth at bedtime.     . montelukast (SINGULAIR) 10 MG tablet TAKE 1 TABLET (10 MG TOTAL) BY MOUTH AT BEDTIME. 90 tablet 3  . omeprazole (PRILOSEC) 20 MG capsule Take 20 mg by mouth daily. X 2 capsules    . rosuvastatin (CRESTOR) 20 MG tablet TAKE 1 TABLET BY MOUTH EVERY DAY 90 tablet 1  . spironolactone (ALDACTONE) 50 MG tablet Take 1 tablet (50 mg total) by mouth daily. 30 tablet 11  . hydrALAZINE (APRESOLINE) 25 MG tablet Take 1 tablet (25 mg total) by mouth 2 (two) times daily.  60 tablet 11   No current  facility-administered medications on file prior to visit.     Temp 98.5 F (36.9 C) (Oral)    Objective:   Physical Exam  Constitutional: She is oriented to person, place, and time. She appears well-nourished. She does not have a sickly appearance.  Respiratory: Effort normal.  Neurological: She is alert and oriented to person, place, and time.  Psychiatric: She has a normal mood and affect.           Assessment & Plan:

## 2019-10-16 NOTE — Telephone Encounter (Signed)
Patient evaluated.  

## 2019-10-16 NOTE — Patient Instructions (Signed)
Please have your labs done and complete the stool kit as soon as possible.  Ensure you are consuming 64 ounces of water daily.  Please notify me if you develop abdominal pain, bloody stools, start to run fevers, feel worse.  It was a pleasure to see you today! Allie Bossier, NP-C

## 2019-10-16 NOTE — Telephone Encounter (Signed)
Per appt notes pt had virtual appt today at 8:40 with Gentry Fitz NP.

## 2019-10-17 ENCOUNTER — Other Ambulatory Visit: Payer: BC Managed Care – PPO

## 2019-10-17 DIAGNOSIS — R197 Diarrhea, unspecified: Secondary | ICD-10-CM

## 2019-10-18 DIAGNOSIS — Z20828 Contact with and (suspected) exposure to other viral communicable diseases: Secondary | ICD-10-CM | POA: Diagnosis not present

## 2019-10-18 LAB — GASTROINTESTINAL PATHOGEN PANEL PCR
C. difficile Tox A/B, PCR: NOT DETECTED
Campylobacter, PCR: NOT DETECTED
Cryptosporidium, PCR: NOT DETECTED
E coli (ETEC) LT/ST PCR: NOT DETECTED
E coli (STEC) stx1/stx2, PCR: NOT DETECTED
E coli 0157, PCR: NOT DETECTED
Giardia lamblia, PCR: NOT DETECTED
Norovirus, PCR: NOT DETECTED
Rotavirus A, PCR: NOT DETECTED
Salmonella, PCR: NOT DETECTED
Shigella, PCR: NOT DETECTED

## 2019-10-19 DIAGNOSIS — R197 Diarrhea, unspecified: Secondary | ICD-10-CM

## 2019-10-19 MED ORDER — DICYCLOMINE HCL 10 MG PO CAPS: ORAL_CAPSULE | ORAL | 0 refills | Status: DC

## 2019-10-19 NOTE — Telephone Encounter (Signed)
She doesn't seem to have inflammatory cause given normal CBC.  Colonoscopy without evidence of inflammatory disorders or diverticulosis. Labs don't suggest infection.  I'm going to try some dicyclomine and consider an abdominal ultrasound of her gall bladder if her symptoms persist. Thoughts?  I contacted her back through my message.

## 2019-10-20 ENCOUNTER — Other Ambulatory Visit: Payer: Self-pay | Admitting: Cardiology

## 2019-10-23 ENCOUNTER — Ambulatory Visit (INDEPENDENT_AMBULATORY_CARE_PROVIDER_SITE_OTHER): Payer: BC Managed Care – PPO

## 2019-10-23 ENCOUNTER — Other Ambulatory Visit: Payer: Self-pay

## 2019-10-23 DIAGNOSIS — K7689 Other specified diseases of liver: Secondary | ICD-10-CM | POA: Diagnosis not present

## 2019-10-23 DIAGNOSIS — R197 Diarrhea, unspecified: Secondary | ICD-10-CM

## 2019-10-23 DIAGNOSIS — K529 Noninfective gastroenteritis and colitis, unspecified: Secondary | ICD-10-CM

## 2019-10-24 MED ORDER — DICYCLOMINE HCL 10 MG PO CAPS: ORAL_CAPSULE | ORAL | 0 refills | Status: DC

## 2019-10-25 ENCOUNTER — Other Ambulatory Visit: Payer: Self-pay | Admitting: Family Medicine

## 2019-10-27 ENCOUNTER — Other Ambulatory Visit: Payer: Self-pay | Admitting: Primary Care

## 2019-10-27 DIAGNOSIS — R197 Diarrhea, unspecified: Secondary | ICD-10-CM

## 2019-11-01 ENCOUNTER — Encounter

## 2019-11-01 ENCOUNTER — Encounter: Payer: Self-pay | Admitting: Physician Assistant

## 2019-11-01 ENCOUNTER — Other Ambulatory Visit (INDEPENDENT_AMBULATORY_CARE_PROVIDER_SITE_OTHER): Payer: BC Managed Care – PPO

## 2019-11-01 ENCOUNTER — Ambulatory Visit: Payer: BC Managed Care – PPO | Admitting: Physician Assistant

## 2019-11-01 VITALS — BP 126/68 | HR 73 | Temp 97.9°F | Ht 66.0 in | Wt 182.0 lb

## 2019-11-01 DIAGNOSIS — R197 Diarrhea, unspecified: Secondary | ICD-10-CM | POA: Diagnosis not present

## 2019-11-01 DIAGNOSIS — K760 Fatty (change of) liver, not elsewhere classified: Secondary | ICD-10-CM | POA: Diagnosis not present

## 2019-11-01 DIAGNOSIS — R1084 Generalized abdominal pain: Secondary | ICD-10-CM | POA: Diagnosis not present

## 2019-11-01 LAB — IGA: IgA: 71 mg/dL (ref 68–378)

## 2019-11-01 LAB — TSH: TSH: 1.3 u[IU]/mL (ref 0.35–4.50)

## 2019-11-01 MED ORDER — DICYCLOMINE HCL 10 MG PO CAPS: 10.0000 mg | ORAL_CAPSULE | Freq: Three times a day (TID) | ORAL | 2 refills | Status: DC

## 2019-11-01 NOTE — Patient Instructions (Addendum)
If you are age 59 or older, your body mass index should be between 23-30. Your Body mass index is 29.38 kg/m. If this is out of the aforementioned range listed, please consider follow up with your Primary Care Provider.  If you are age 24 or younger, your body mass index should be between 19-25. Your Body mass index is 29.38 kg/m. If this is out of the aformentioned range listed, please consider follow up with your Primary Care Provider.   Your provider has requested that you go to the basement level for lab work before leaving today. Press "B" on the elevator. The lab is located at the first door on the left as you exit the elevator.  We have sent the following medications to your pharmacy for you to pick up at your convenience:  1. Dicyclomine 10 mg three times daily 20-30 minutes before meals and at bedtime.

## 2019-11-01 NOTE — Progress Notes (Signed)
____________________________________________________________  Attending physician addendum:  Thank you for sending this case to me. I have reviewed the entire note, and the outlined plan seems appropriate.  Yes, if stool infection studies negative, colonoscopy to rule out microscopic colitis.  Wilfrid Lund, MD  ____________________________________________________________

## 2019-11-01 NOTE — Progress Notes (Signed)
Chief Complaint: Diarrhea  HPI:    Amanda Estrada is a 58 year old Caucasian female with a past medical history as listed below including anxiety and reflux and paroxysmal A. fib on Eliquis, known to Dr. Rachael Darby, who was referred to me by Jinny Sanders, MD for a complaint of diarrhea.      12/04/2018 colonoscopy was normal.  Repeat recommended in 5 years for screening purposes given family history of colon cancer.    10/16/2019 CBC and CMP normal.    10/17/2019 GI pathogen panel negative    10/23/2019 right upper quadrant ultrasound with diffuse increase in liver echogenicity, finding indicative of hepatic steatosis.  Otherwise normal.    Today, the patient explains that since before Thanksgiving she has had watery loose stools.  Tells me she has at least 10-15 stools a day which are just liquid and just "come out and are urgent".  Tells me that before she will have a bowel movement she has a generalized abdominal cramping pain which gets better afterwards.  Tried Imodium at home which made no difference.  Prior to all of this was having a solid stool once or twice a day.  Explains that she has not changed any medications and was not on any antibiotics and did not eating anything abnormal, has been around no one else that has been sick.  Was using the Dicyclomine 10 mg every morning because she was only given 15 pills and wanted to make them last.  This did help her if she took it in the morning she would not have another bowel movement until around lunchtime.  Denies previous history of irritable bowel syndrome or symptoms similar to this.  Does tell me that 2 days ago she was laying in bed and woke up in a puddle of stool.  This has not happened since.    Denies fever, chills, weight loss or blood in her stool.  Past Medical History:  Diagnosis Date  . Allergy   . Anxiety    patient denies  . Asthma   . Cataract   . GERD (gastroesophageal reflux disease)   . Glaucoma   . History of  echocardiogram    a. 11/2015: EF 55-60%, no RWMA, nl LV diastolic fxn, PASP nl  . Hypertension   . Obesity   . PAF (paroxysmal atrial fibrillation) ([place])    a. CHADS2VASc = 2 (HTN, sex category)  . Sinusitis 2011   Recurrent per Dr. Tami Ribas with [facility]    Past Surgical History:  Procedure Laterality Date  . BUNIONECTOMY  11/2007   Right foot, 2 hammer-toes and tendon replacement (Dr. Paulla Dolly)  . cataract Bilateral 06/2018   narrow angle glaucoma bilateral  . CERVICAL DISCECTOMY  2001   C5/6  . CESAREAN SECTION  1985   Due to incr BP  . COLONOSCOPY    . ENDOMETRIAL BIOPSY  03/27/2008   B9 endometrium w/breakdown changes (Dr, Kinscius)  . LASER BRONCHOSCOPY N/A 09/19/2017   Procedure: LASER BRONCHOSCOPY;  Surgeon: Melrose Nakayama, MD;  Location: [facility] OR;  Service: Thoracic;  Laterality: N/A;  . Lasix eye surgery    . LUNG SURGERY Left 09/19/2017   benign tumor removed  . NSVD  1989   VBAC  . Renal artery [place]  02/2007   [place] arteries but no stenosis  . TIBIA FRACTURE SURGERY Left 12/2017  . US TRANSVAGINAL PELVIC MODIFIED  04/09/2008   Normal  . VIDEO BRONCHOSCOPY Bilateral 08/10/2017   Procedure: VIDEO BRONCHOSCOPY WITHOUT  FLUORO;  Surgeon: Tanda Rockers, MD;  Location: [facility];  Service: Cardiopulmonary;  Laterality: Bilateral;  . VIDEO BRONCHOSCOPY N/A 09/19/2017   Procedure: VIDEO BRONCHOSCOPY, with endobronchial tumor resection;  Surgeon: Melrose Nakayama, MD;  Location: [place];  Service: Thoracic;  Laterality: N/A;  . VIDEO BRONCHOSCOPY Bilateral 08/16/2018   Procedure: VIDEO BRONCHOSCOPY WITHOUT FLUORO;  Surgeon: Tanda Rockers, MD;  Location: [facility];  Service: Cardiopulmonary;  Laterality: Bilateral;    Current Outpatient Medications  Medication Sig Dispense Refill  . albuterol (VENTOLIN HFA) 108 (90 Base) MCG/ACT inhaler Inhale 2 puffs into lungs every 6 hours as needed for wheezing. 18 g 5  . albuterol (VENTOLIN HFA) 108 (90 Base)  MCG/ACT inhaler Inhale 2 puffs into the lungs every 4 (four) hours as needed for wheezing or shortness of breath. 8 g 2  . apixaban (ELIQUIS) 5 MG TABS tablet Take 1 tablet (5 mg total) by mouth 2 (two) times daily. 60 tablet 11  . bisoprolol (ZEBETA) 5 MG tablet Take 1 tablet (5 mg total) by mouth daily. 90 tablet 3  . cloNIDine (CATAPRES) 0.1 MG tablet TAKE 1 TABLET (0.1 MG TOTAL) BY MOUTH AT BEDTIME. 90 tablet 1  . dicyclomine (BENTYL) 10 MG capsule TAKE 1 CAPSULE BY MOUTH BEFORE EACH MEAL AS NEEDED. 15 capsule 0  . diltiazem (CARDIZEM CD) 240 MG 24 hr capsule Take 1 capsule (240 mg total) by mouth daily. 30 capsule 11  . fluticasone (FLONASE) 50 MCG/ACT nasal spray Place 1 spray into both nostrils daily as needed for allergies. 16 g 11  . hydrALAZINE (APRESOLINE) 25 MG tablet TAKE 1 TABLET BY MOUTH TWICE A DAY 180 tablet 3  . levocetirizine (XYZAL) 5 MG tablet Take 5 mg by mouth at bedtime.     . montelukast (SINGULAIR) 10 MG tablet TAKE 1 TABLET (10 MG TOTAL) BY MOUTH AT BEDTIME. 90 tablet 0  . omeprazole (PRILOSEC) 20 MG capsule Take 20 mg by mouth daily. X 2 capsules    . rosuvastatin (CRESTOR) 20 MG tablet TAKE 1 TABLET BY MOUTH EVERY DAY 90 tablet 1  . spironolactone (ALDACTONE) 50 MG tablet Take 1 tablet (50 mg total) by mouth daily. 30 tablet 11   No current facility-administered medications for this visit.    Allergies as of 11/01/2019 - Review Complete 10/16/2019  Allergen Reaction Noted  . Amoxicillin-pot clavulanate Other (See Comments) 08/06/2016    Family History  Problem Relation Age of Onset  . Cancer - Colon Mother 33       partial colectomy  . Melanoma Mother        x2  . Colon cancer Mother   . Heart disease Father 12       CHF  . Hypertension Father   . Pneumonia Father   . Colon cancer Father 71  . Colon polyps Sister   . Hypertension Brother   . Colon polyps Brother   . Hypertension Sister   . Colon polyps Sister   . Colon polyps Sister   . Esophageal  cancer Neg Hx   . Rectal cancer Neg Hx   . Stomach cancer Neg Hx     Social History   Socioeconomic History  . Marital status: Married    Spouse name: Not on file  . Number of children: 2  . Years of education: Not on file  . Highest education level: Not on file  Occupational History  . Occupation: Air cabin crew, Engineer, materials: [employer]  Tobacco Use  . Smoking status: Never Smoker  . Smokeless tobacco: Never Used  Substance and Sexual Activity  . Alcohol use: Yes    Alcohol/week: 14.0 standard drinks    Types: 14 Glasses of wine per week    Comment: 2 glasses of wine every day  . Drug use: No  . Sexual activity: Not on file  Other Topics Concern  . Not on file  Social History Narrative   No regular exercise, used to but unable to do given chronic cough.   Social Determinants of Health   Financial Resource Strain:   . Difficulty of Paying Living Expenses: Not on file  Food Insecurity:   . Worried About Charity fundraiser in the Last Year: Not on file  . Ran Out of Food in the Last Year: Not on file  Transportation Needs:   . Lack of Transportation (Medical): Not on file  . Lack of Transportation (Non-Medical): Not on file  Physical Activity:   . Days of Exercise per Week: Not on file  . Minutes of Exercise per Session: Not on file  Stress:   . Feeling of Stress : Not on file  Social Connections:   . Frequency of Communication with Friends and Family: Not on file  . Frequency of Social Gatherings with Friends and Family: Not on file  . Attends Religious Services: Not on file  . Active Member of Clubs or Organizations: Not on file  . Attends Archivist Meetings: Not on file  . Marital Status: Not on file  Intimate Partner Violence:   . Fear of Current or Ex-Partner: Not on file  . Emotionally Abused: Not on file  . Physically Abused: Not on file  . Sexually Abused: Not on file    Review of Systems:      Constitutional: No weight loss, fever or chills Cardiovascular: No chest pain Respiratory: No SOB Gastrointestinal: See HPI and otherwise negative   Physical Exam:  Vital signs: BP 126/68   Pulse 73   Temp 97.9 F (36.6 C)   Ht 5\' 6"  (1.676 m)   Wt 182 lb (82.6 kg)   BMI 29.38 kg/m   Constitutional:   Pleasant overweight Caucasian female appears to be in NAD, Well developed, Well nourished, alert and cooperative Respiratory: Respirations even and unlabored. Lungs clear to auscultation bilaterally.   No wheezes, crackles, or rhonchi.  Cardiovascular: Normal S1, S2. No MRG. Regular rate and rhythm. No peripheral edema, cyanosis or pallor.  Gastrointestinal:  Soft, nondistended, nontender. No rebound or guarding. Normal bowel sounds. No appreciable masses or hepatomegaly. Rectal:  Not performed.  Psychiatric:  Demonstrates good judgement and reason without abnormal affect or behaviors.  RELEVANT LABS AND IMAGING: CBC    Component Value Date/Time   WBC 6.2 10/16/2019 1038   RBC 4.03 10/16/2019 1038   HGB 12.8 10/16/2019 1038   HGB 13.1 12/02/2015 1000   HCT 38.7 10/16/2019 1038   HCT 37.8 12/02/2015 1000   PLT 242.0 10/16/2019 1038   PLT 271 12/02/2015 1000   MCV 96.0 10/16/2019 1038   MCV 89 12/02/2015 1000   MCH 31.4 12/16/2017 1548   MCHC 33.2 10/16/2019 1038   RDW 12.6 10/16/2019 1038   RDW 12.6 12/02/2015 1000   LYMPHSABS 1.6 10/16/2019 1038   MONOABS 0.6 10/16/2019 1038   EOSABS 0.1 10/16/2019 1038   BASOSABS 0.0 10/16/2019 1038    CMP     Component Value Date/Time  NA 131 (L) 10/16/2019 1038   NA 134 12/02/2015 1000   K 4.1 10/16/2019 1038   CL 98 10/16/2019 1038   CO2 24 10/16/2019 1038   GLUCOSE 97 10/16/2019 1038   BUN 9 10/16/2019 1038   BUN 12 12/02/2015 1000   CREATININE 0.67 10/16/2019 1038   CALCIUM 9.5 10/16/2019 1038   PROT 6.6 10/16/2019 1038   ALBUMIN 4.4 10/16/2019 1038   AST 14 10/16/2019 1038   ALT 15 10/16/2019 1038   ALKPHOS 62  10/16/2019 1038   BILITOT 0.5 10/16/2019 1038   GFRNONAA >60 09/15/2017 1428   GFRAA >60 09/15/2017 1428    Assessment: 1.  Diarrhea: For the past month, worse over the past 2 weeks, did have one nighttime stooling, GI pathogen panel negative, CBC and CMP normal other than a minimally low sodium, no new medications or recent abx; consider other infectious cause versus inflammatory 2.  Generalized abdominal cramping: With diarrhea above, resolved after a bowel movement 3.  Fatty liver: Found on ultrasound  Plan: 1.  Reviewed patient's recent colonoscopy which was completely normal in January of this year.  Explained to the patient that if additional stool tests and labs were all negative/normal would recommend that she have a repeat to consider things such as microscopic colitis.  She verbalized understanding. 2.  Prescribed Dicyclomine 10 mg 4 times daily, 20 to 30 minutes before meals and at bedtime #120 with 3 refills. 3.  Added additional labs today including a TSH, celiac testing and stool studies to include a pancreatic fecal elastase and lactoferrin as well as O&P. 4.  Discussed fatty liver finding on recent ultrasound.  Recommended a slow and steady weight loss of 1 to 2 pounds per week.  Did discuss that over time this can turn into cirrhosis, but with slow and steady weight loss she can help to reverse the process. 5.  Patient to follow in clinic per recommendations after labs above.  Ellouise Newer, PA-C [facility] 11/01/2019, 8:43 AM  Cc: Jinny Sanders, MD

## 2019-11-05 ENCOUNTER — Other Ambulatory Visit: Payer: BC Managed Care – PPO

## 2019-11-05 DIAGNOSIS — R197 Diarrhea, unspecified: Secondary | ICD-10-CM | POA: Diagnosis not present

## 2019-11-05 DIAGNOSIS — R1084 Generalized abdominal pain: Secondary | ICD-10-CM | POA: Diagnosis not present

## 2019-11-06 LAB — TISSUE TRANSGLUTAMINASE ABS,IGG,IGA
(tTG) Ab, IgA: 1 U/mL
(tTG) Ab, IgG: 1 U/mL

## 2019-11-07 ENCOUNTER — Telehealth: Payer: Self-pay

## 2019-11-07 NOTE — Telephone Encounter (Signed)
Called patient back and she had seen something on MyChart about a lab specimen. She has some labs in progress. I told her there have been changes in the laws, where the patients can sometimes see things before the Dr. gets a chance to review them. Told her we would call her as soon as results are final and Dr. has reviewed them. She was good with that

## 2019-11-10 LAB — OVA AND PARASITE EXAMINATION
CONCENTRATE RESULT:: NONE SEEN
MICRO NUMBER:: 1232607
SPECIMEN QUALITY:: ADEQUATE
TRICHROME RESULT:: NONE SEEN

## 2019-11-10 LAB — FECAL LACTOFERRIN, QUANT
Fecal Lactoferrin: POSITIVE — AB
MICRO NUMBER:: 1232666
SPECIMEN QUALITY:: ADEQUATE

## 2019-11-10 LAB — PANCREATIC ELASTASE, FECAL: Pancreatic Elastase-1, Stool: 429 mcg/g

## 2019-11-10 LAB — TIQ-NTM

## 2019-11-12 ENCOUNTER — Other Ambulatory Visit: Payer: Self-pay

## 2019-11-12 MED ORDER — CIPROFLOXACIN HCL 500 MG PO TABS: 500.0000 mg | ORAL_TABLET | Freq: Three times a day (TID) | ORAL | 0 refills | Status: DC

## 2019-11-12 MED ORDER — METRONIDAZOLE 500 MG PO TABS: 500.0000 mg | ORAL_TABLET | Freq: Two times a day (BID) | ORAL | 0 refills | Status: DC

## 2019-11-18 ENCOUNTER — Other Ambulatory Visit: Payer: Self-pay | Admitting: Cardiology

## 2019-11-22 ENCOUNTER — Other Ambulatory Visit: Payer: Self-pay | Admitting: Primary Care

## 2019-11-22 DIAGNOSIS — R197 Diarrhea, unspecified: Secondary | ICD-10-CM

## 2019-12-16 ENCOUNTER — Other Ambulatory Visit: Payer: Self-pay | Admitting: Cardiology

## 2019-12-25 ENCOUNTER — Other Ambulatory Visit: Payer: Self-pay | Admitting: Family Medicine

## 2019-12-25 ENCOUNTER — Other Ambulatory Visit: Payer: Self-pay | Admitting: Physician Assistant

## 2020-01-09 ENCOUNTER — Other Ambulatory Visit: Payer: Self-pay | Admitting: Cardiology

## 2020-01-10 ENCOUNTER — Other Ambulatory Visit: Payer: Self-pay | Admitting: Cardiology

## 2020-01-10 ENCOUNTER — Telehealth: Payer: Self-pay | Admitting: Family Medicine

## 2020-01-10 NOTE — Telephone Encounter (Signed)
Please schedule CPE with fasting labs for Dr. Diona Browner.

## 2020-01-14 ENCOUNTER — Other Ambulatory Visit: Payer: Self-pay

## 2020-01-14 ENCOUNTER — Ambulatory Visit: Payer: BC Managed Care – PPO | Admitting: Physician Assistant

## 2020-01-14 ENCOUNTER — Encounter: Payer: Self-pay | Admitting: Physician Assistant

## 2020-01-14 VITALS — BP 142/70 | HR 60 | Ht 66.0 in | Wt 186.6 lb

## 2020-01-14 DIAGNOSIS — I480 Paroxysmal atrial fibrillation: Secondary | ICD-10-CM

## 2020-01-14 DIAGNOSIS — Z7901 Long term (current) use of anticoagulants: Secondary | ICD-10-CM

## 2020-01-14 DIAGNOSIS — I10 Essential (primary) hypertension: Secondary | ICD-10-CM | POA: Diagnosis not present

## 2020-01-14 MED ORDER — BISOPROLOL FUMARATE 5 MG PO TABS: 5.0000 mg | ORAL_TABLET | Freq: Every day | ORAL | 3 refills | Status: DC

## 2020-01-14 MED ORDER — SPIRONOLACTONE 50 MG PO TABS: 50.0000 mg | ORAL_TABLET | Freq: Every day | ORAL | 3 refills | Status: DC

## 2020-01-14 MED ORDER — APIXABAN 5 MG PO TABS: 5.0000 mg | ORAL_TABLET | Freq: Two times a day (BID) | ORAL | 11 refills | Status: DC

## 2020-01-14 MED ORDER — DILTIAZEM HCL ER COATED BEADS 240 MG PO CP24: ORAL_CAPSULE | ORAL | 0 refills | Status: DC

## 2020-01-14 MED ORDER — METOPROLOL TARTRATE 25 MG PO TABS: 25.0000 mg | ORAL_TABLET | Freq: Two times a day (BID) | ORAL | 3 refills | Status: DC | PRN

## 2020-01-14 MED ORDER — CLONIDINE HCL 0.1 MG PO TABS: 0.1000 mg | ORAL_TABLET | Freq: Every day | ORAL | 1 refills | Status: DC

## 2020-01-14 MED ORDER — ROSUVASTATIN CALCIUM 20 MG PO TABS: 20.0000 mg | ORAL_TABLET | Freq: Every day | ORAL | 1 refills | Status: DC

## 2020-01-14 MED ORDER — HYDRALAZINE HCL 25 MG PO TABS: 25.0000 mg | ORAL_TABLET | Freq: Two times a day (BID) | ORAL | 3 refills | Status: DC

## 2020-01-14 NOTE — Progress Notes (Signed)
Cardiology Office Note    Date:  01/14/2020   ID:  Amanda Estrada, DOB 05-Feb-1961, MRN [id number]  PCP:  Jinny Sanders, MD Cardiologist:  Minus Breeding, MD  12/25/2018 Electrphysiologist: None Rosaria Ferries, PA-C   No chief complaint on file.   History of Present Illness: Amanda Estrada is a 59 y.o. female with a history of atrial fib on Eliquis, GERD, HTN, echo w/ nl EF 2017, obesity  Amanda Estrada presents for cardiology follow up.   She has not had any palpitations in a year. She was previously on prn Lopressor 25 mg, but the rx has expired.   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   She does not get SOB or chest pain with exertion. She does steps, does housework, etc, without difficulty.   No LE edema, no orthopnea or PND.    No presyncope or syncope.   Lipids are followed by Dr Diona Browner.   Her BP is about where it runs today. SBP generally 140 or so. Takes the clonidine at bedtime, it makes her feel draggy and sleepy, she would not be able to tolerate taking it twice a day.    Past Medical History:  Diagnosis Date  . Allergy   . Anxiety    patient denies  . Asthma   . Cataract   . GERD (gastroesophageal reflux disease)   . Glaucoma   . History of echocardiogram    a. 11/2015: EF 55-60%, no RWMA, nl LV diastolic fxn, PASP nl  . Hypertension   . Obesity   . PAF (paroxysmal atrial fibrillation) ([place])    a. CHADS2VASc = 2 (HTN, sex category)  . Sinusitis 2011   Recurrent per Dr. Tami Ribas with [facility]    Past Surgical History:  Procedure Laterality Date  . BUNIONECTOMY  11/2007   Right foot, 2 hammer-toes and tendon replacement (Dr. Paulla Dolly)  . cataract Bilateral 06/2018   narrow angle glaucoma bilateral  . CERVICAL DISCECTOMY  2001   C5/6  . CESAREAN SECTION  1985   Due to incr BP  . COLONOSCOPY    . ENDOMETRIAL  BIOPSY  03/27/2008   B9 endometrium w/breakdown changes (Dr, Kinscius)  . LASER BRONCHOSCOPY N/A 09/19/2017   Procedure: LASER BRONCHOSCOPY;  Surgeon: Melrose Nakayama, MD;  Location: [facility] OR;  Service: Thoracic;  Laterality: N/A;  . Lasix eye surgery    . LUNG SURGERY Left 09/19/2017   benign tumor removed  . NSVD  1989   VBAC  . Renal artery [place]  02/2007   [place] arteries but no stenosis  . TIBIA FRACTURE SURGERY Left 12/2017  . US TRANSVAGINAL PELVIC MODIFIED  04/09/2008   Normal  . VIDEO BRONCHOSCOPY Bilateral 08/10/2017   Procedure: VIDEO BRONCHOSCOPY WITHOUT FLUORO;  Surgeon: Tanda Rockers, MD;  Location: [facility];  Service: Cardiopulmonary;  Laterality: Bilateral;  . VIDEO BRONCHOSCOPY N/A 09/19/2017   Procedure: VIDEO BRONCHOSCOPY, with endobronchial tumor resection;  Surgeon: Melrose Nakayama, MD;  Location: [place];  Service: Thoracic;  Laterality: N/A;  . VIDEO BRONCHOSCOPY Bilateral 08/16/2018   Procedure: VIDEO BRONCHOSCOPY WITHOUT FLUORO;  Surgeon: Tanda Rockers, MD;  Location: [facility];  Service: Cardiopulmonary;  Laterality: Bilateral;    Current Outpatient Medications  Medication Sig Dispense Refill  . albuterol (VENTOLIN HFA) 108 (90 Base) MCG/ACT inhaler Inhale 2  puffs into the lungs every 4 (four) hours as needed for wheezing or shortness of breath. 8 g 2  . apixaban (ELIQUIS) 5 MG TABS tablet Take 1 tablet (5 mg total) by mouth 2 (two) times daily. 60 tablet 11  . bisoprolol (ZEBETA) 5 MG tablet TAKE 1 TABLET BY MOUTH EVERY DAY 90 tablet 3  . ciprofloxacin (CIPRO) 500 MG tablet Take 1 tablet (500 mg total) by mouth 3 (three) times daily. 21 tablet 0  . cloNIDine (CATAPRES) 0.1 MG tablet TAKE 1 TABLET (0.1 MG TOTAL) BY MOUTH AT BEDTIME. 90 tablet 1  . dicyclomine (BENTYL) 10 MG capsule Take 1 capsule (10 mg total) by mouth 4 (four) times daily -  before meals and at bedtime. 120 capsule 2  . diltiazem (CARDIZEM CD) 240 MG 24 hr capsule TAKE 1  CAPSULE BY MOUTH DAILY. PLEASE SCHEDULE APPT WITH DR. HOCHREIN FOR REFILLS. [phone]. 30 capsule 0  . fluticasone (FLONASE) 50 MCG/ACT nasal spray Place 1 spray into both nostrils daily as needed for allergies. 16 g 11  . hydrALAZINE (APRESOLINE) 25 MG tablet TAKE 1 TABLET BY MOUTH TWICE A DAY 180 tablet 3  . levocetirizine (XYZAL) 5 MG tablet Take 5 mg by mouth at bedtime.     . metroNIDAZOLE (FLAGYL) 500 MG tablet Take 1 tablet (500 mg total) by mouth 2 (two) times daily. (Patient not taking: Reported on 01/14/2020) 14 tablet 0  . montelukast (SINGULAIR) 10 MG tablet TAKE 1 TABLET (10 MG TOTAL) BY MOUTH AT BEDTIME. 90 tablet 0  . omeprazole (PRILOSEC) 20 MG capsule Take 20 mg by mouth daily. X 2 capsules    . rosuvastatin (CRESTOR) 20 MG tablet TAKE 1 TABLET BY MOUTH EVERY DAY 90 tablet 1  . spironolactone (ALDACTONE) 50 MG tablet Take 1 tablet (50 mg total) by mouth daily. 90 tablet 3   No current facility-administered medications for this visit.    Allergies:   Amoxicillin-pot clavulanate    Social History:  The patient  reports that she has never smoked. She has never used smokeless tobacco. She reports current alcohol use of about 14.0 standard drinks of alcohol per week. She reports that she does not use drugs.   Family History:  The patient's family history includes Cancer - Colon (age of onset: 64) in her mother; Colon cancer in her mother; Colon cancer (age of onset: 67) in her father; Colon polyps in her brother, sister, sister, and sister; Heart disease (age of onset: 59) in her father; Hypertension in her brother, father, and sister; Melanoma in her mother; Pneumonia in her father.  She indicated that her mother is alive. She indicated that her father is deceased. She indicated that all of her three sisters are alive. She indicated that her brother is alive. She indicated that her maternal grandmother is deceased. She indicated that her maternal grandfather is deceased. She  indicated that her paternal grandmother is deceased. She indicated that her paternal grandfather is deceased. She indicated that the status of her neg hx is unknown.    ROS:  Please see the history of present illness. All other systems are reviewed and negative.    PHYSICAL EXAM: VS:  BP (!) 142/70   Pulse 60   Ht 5\' 6"  (1.676 m)   Wt 186 lb 9.6 oz (84.6 kg)   BMI 30.12 kg/m  , BMI Body mass index is 30.12 kg/m. GEN: Well nourished, well developed, female in no acute distress HEENT: normal for age  Neck:  no JVD, no carotid bruit, no masses Cardiac: RRR; no murmur, no rubs, or gallops Respiratory:  clear to auscultation bilaterally, normal work of breathing GI: soft, nontender, nondistended, + BS MS: no deformity or atrophy; no edema; distal pulses are 2+ in all 4 extremities  Skin: warm and dry, no rash Neuro:  Strength and sensation are intact Psych: euthymic mood, full affect   EKG:  EKG is ordered today. The ekg ordered today demonstrates SR, HR 60, no acute ischemic changes.  ECHO: 11/25/2015 - Left ventricle: The cavity size was normal. Systolic function was  normal. The estimated ejection fraction was in the range of 55%  to 60%. Wall motion was normal; there were no regional wall  motion abnormalities. Left ventricular diastolic function  parameters were normal.  - Left atrium: The atrium was normal in size.  - Right ventricle: Systolic function was normal.  - Pulmonary arteries: Systolic pressure was within the normal  range.   Impressions:   - Normal study.    Recent Labs: 10/16/2019: ALT 15; BUN 9; Creatinine, Ser 0.67; Hemoglobin 12.8; Platelets 242.0; Potassium 4.1; Sodium 131 11/01/2019: TSH 1.30  CBC    Component Value Date/Time   WBC 6.2 10/16/2019 1038   RBC 4.03 10/16/2019 1038   HGB 12.8 10/16/2019 1038   HGB 13.1 12/02/2015 1000   HCT 38.7 10/16/2019 1038   HCT 37.8 12/02/2015 1000   PLT 242.0 10/16/2019 1038   PLT 271 12/02/2015  1000   MCV 96.0 10/16/2019 1038   MCV 89 12/02/2015 1000   MCH 31.4 12/16/2017 1548   MCHC 33.2 10/16/2019 1038   RDW 12.6 10/16/2019 1038   RDW 12.6 12/02/2015 1000   LYMPHSABS 1.6 10/16/2019 1038   MONOABS 0.6 10/16/2019 1038   EOSABS 0.1 10/16/2019 1038   BASOSABS 0.0 10/16/2019 1038   CMP Latest Ref Rng & Units 10/16/2019 12/05/2018 12/01/2018  Glucose 70 - 99 mg/dL 97 97 92  BUN 6 - 23 mg/dL 9 11 11   Creatinine 0.40 - 1.20 mg/dL 0.67 0.72 0.68  Sodium 135 - 145 mEq/L 131(L) 129(L) 130(L)  Potassium 3.5 - 5.1 mEq/L 4.1 4.7 4.5  Chloride 96 - 112 mEq/L 98 95(L) 96  CO2 19 - 32 mEq/L 24 25 26   Calcium 8.4 - 10.5 mg/dL 9.5 10.7(H) 9.9  Total Protein 6.0 - 8.3 g/dL 6.6 - 7.2  Total Bilirubin 0.2 - 1.2 mg/dL 0.5 - 0.4  Alkaline Phos 39 - 117 U/L 62 - 51  AST 0 - 37 U/L 14 - 22  ALT 0 - 35 U/L 15 - 21     Lipid Panel Lab Results  Component Value Date   CHOL 158 12/01/2018   HDL 104.90 12/01/2018   LDLCALC 43 12/01/2018   LDLDIRECT 81.0 10/16/2010   TRIG 51.0 12/01/2018   CHOLHDL 2 12/01/2018      Wt Readings from Last 3 Encounters:  01/14/20 186 lb 9.6 oz (84.6 kg)  11/01/19 182 lb (82.6 kg)  10/02/19 160 lb (72.6 kg)     Other studies Reviewed: Additional studies/ records that were reviewed today include: Office notes, hospital records and testing.  ASSESSMENT AND PLAN:  1.  PAF: -She is maintaining sinus rhythm.  She is to continue the bisoprolol 5 mg daily and the Cardizem CD 240 mg daily.  As her heart rate on her ECG today is 60, I am reluctant to increase the Cardizem or bisoprolol. -However, I will renew the metoprolol tartrate 25 mg twice daily as  needed for palpitations so she will have something to take if she gets symptomatic.  2.  Chronic anticoagulation: -She is compliant with Eliquis 5 mg twice daily -She is not having any bleeding issues -Continue Eliquis at the current dose  3.  Hypertension: -According to the patient, her blood pressure is  sometimes lower than 140, but generally about 140 -She is on bisoprolol 5 mg, Cardizem CD 240 mg, hydralazine 25 mg twice daily and Aldactone 50 mg a day. -Do not believe she would tolerate taking a higher dose of the clonidine as she says it makes her draggy and sleepy -Dr. Percival Spanish or Dr. Diona Browner to address increasing the hydralazine to 50 mg twice daily (or more) and stopping the clonidine   Current medicines are reviewed at length with the patient today.  The patient does not have concerns regarding medicines.  The following changes have been made:  no change  Labs/ tests ordered today include:  No orders of the defined types were placed in this encounter.    Disposition:   FU with Minus Breeding, MD  Signed, Rosaria Ferries, PA-C  01/14/2020 4:09 PM    [place] Phone: [phone]; Fax: [phone]

## 2020-01-14 NOTE — Patient Instructions (Signed)
Medication Instructions:  BEGIN TAKING METOPROLOL TARTRATE 25MG  (1 TABLET) TWICE A DAY AS NEEDED FOR PALPITATIONS/ATRIAL FIBRILLATION  *If you need a refill on your cardiac medications before your next appointment, please call your pharmacy*  Follow-Up: At [facility], you and your health needs are our priority.  As part of our continuing mission to provide you with exceptional heart care, we have created designated Provider Care Teams.  These Care Teams include your primary Cardiologist (physician) and Advanced Practice Providers (APPs -  Physician Assistants and Nurse Practitioners) who all work together to provide you with the care you need, when you need it.  We recommend signing up for the patient portal called "MyChart".  Sign up information is provided on this After Visit Summary.  MyChart is used to connect with patients for Virtual Visits (Telemedicine).  Patients are able to view lab/test results, encounter notes, upcoming appointments, etc.  Non-urgent messages can be sent to your provider as well.   To learn more about what you can do with MyChart, go to [email or web address].    Your next appointment:   12 month(s)  The format for your next appointment:   In Person  Provider:   Minus Breeding, MD

## 2020-01-15 NOTE — Progress Notes (Signed)
Will discuss  changing BP meds with pt at upcoming [place] in April if not addressed earlier by Cardiology.

## 2020-01-15 NOTE — Telephone Encounter (Signed)
Labs 4/12 Cpx 4/15 Pt aware

## 2020-01-25 ENCOUNTER — Other Ambulatory Visit (INDEPENDENT_AMBULATORY_CARE_PROVIDER_SITE_OTHER): Payer: BC Managed Care – PPO

## 2020-01-25 DIAGNOSIS — I480 Paroxysmal atrial fibrillation: Secondary | ICD-10-CM | POA: Diagnosis not present

## 2020-02-03 ENCOUNTER — Other Ambulatory Visit: Payer: Self-pay | Admitting: Physician Assistant

## 2020-02-03 DIAGNOSIS — R197 Diarrhea, unspecified: Secondary | ICD-10-CM

## 2020-02-11 ENCOUNTER — Ambulatory Visit: Payer: BC Managed Care – PPO

## 2020-02-13 ENCOUNTER — Other Ambulatory Visit: Payer: Self-pay

## 2020-02-18 ENCOUNTER — Other Ambulatory Visit (INDEPENDENT_AMBULATORY_CARE_PROVIDER_SITE_OTHER): Payer: BC Managed Care – PPO

## 2020-02-18 ENCOUNTER — Telehealth: Payer: Self-pay | Admitting: Family Medicine

## 2020-02-18 ENCOUNTER — Other Ambulatory Visit: Payer: Self-pay

## 2020-02-18 DIAGNOSIS — E7800 Pure hypercholesterolemia, unspecified: Secondary | ICD-10-CM

## 2020-02-18 LAB — COMPREHENSIVE METABOLIC PANEL
ALT: 17 U/L (ref 0–35)
AST: 15 U/L (ref 0–37)
Albumin: 4.6 g/dL (ref 3.5–5.2)
Alkaline Phosphatase: 57 U/L (ref 39–117)
BUN: 11 mg/dL (ref 6–23)
CO2: 25 mEq/L (ref 19–32)
Calcium: 9.7 mg/dL (ref 8.4–10.5)
Chloride: 97 mEq/L (ref 96–112)
Creatinine, Ser: 0.73 mg/dL (ref 0.40–1.20)
GFR: 81.48 mL/min (ref >60.00)
Glucose, Bld: 98 mg/dL (ref 70–99)
Potassium: 4.4 mEq/L (ref 3.5–5.1)
Sodium: 131 mEq/L — ABNORMAL LOW (ref 135–145)
Total Bilirubin: 0.5 mg/dL (ref 0.2–1.2)
Total Protein: 6.9 g/dL (ref 6.0–8.3)

## 2020-02-18 LAB — LIPID PANEL
Cholesterol: 176 mg/dL (ref 0–200)
HDL: 94.5 mg/dL (ref >39.00)
LDL Cholesterol: 62 mg/dL (ref 0–99)
NonHDL: 81.39
Total CHOL/HDL Ratio: 2
Triglycerides: 96 mg/dL (ref 0.0–149.0)
VLDL: 19.2 mg/dL (ref 0.0–40.0)

## 2020-02-18 NOTE — Telephone Encounter (Signed)
-----   Message from Ellamae Sia sent at 02/05/2020  8:38 AM EDT ----- Regarding: Lab orders for Monday, 4.12.21 Patient is scheduled for CPX labs, please order future labs, Thanks , Karna Christmas

## 2020-02-19 NOTE — Progress Notes (Signed)
No critical labs need to be addressed urgently. We will discuss labs in detail at upcoming office visit.   

## 2020-02-21 ENCOUNTER — Other Ambulatory Visit: Payer: Self-pay

## 2020-02-21 ENCOUNTER — Ambulatory Visit (INDEPENDENT_AMBULATORY_CARE_PROVIDER_SITE_OTHER): Payer: BC Managed Care – PPO | Admitting: Family Medicine

## 2020-02-21 ENCOUNTER — Encounter: Payer: BC Managed Care – PPO | Admitting: Family Medicine

## 2020-02-21 ENCOUNTER — Encounter: Payer: Self-pay | Admitting: Family Medicine

## 2020-02-21 VITALS — BP 140/64 | HR 67 | Temp 98.2°F | Ht 64.75 in | Wt 187.5 lb

## 2020-02-21 DIAGNOSIS — I10 Essential (primary) hypertension: Secondary | ICD-10-CM | POA: Diagnosis not present

## 2020-02-21 DIAGNOSIS — I4891 Unspecified atrial fibrillation: Secondary | ICD-10-CM

## 2020-02-21 DIAGNOSIS — Z0000 Encounter for general adult medical examination without abnormal findings: Secondary | ICD-10-CM | POA: Diagnosis not present

## 2020-02-21 DIAGNOSIS — J4530 Mild persistent asthma, uncomplicated: Secondary | ICD-10-CM

## 2020-02-21 DIAGNOSIS — E7800 Pure hypercholesterolemia, unspecified: Secondary | ICD-10-CM | POA: Diagnosis not present

## 2020-02-21 NOTE — Assessment & Plan Note (Signed)
At goal on statin. Encouraged exercise, weight loss, healthy eating habits.

## 2020-02-21 NOTE — Progress Notes (Signed)
Chief Complaint  Patient presents with  . Annual Exam    History of Present Illness: HPI  The patient is here for annual wellness exam and preventative care.    Atrial fibrillation: Rate controlled with coreg, cardiazem metoprolol prn and on eliquis for anticoagulation.  Cardiology:  Last OV reviewed 01/2020 Dr. Percival Spanish. Elevated coronary artery score and coronary atherosclerosis note: Needs risk factor modification.  Hypertension:   Borderline control on bisoprolol 5 mg, Cardizem CD 240 mg, hydralazine 25 mg twice daily and Aldactone 50 mg a day. BP Readings from Last 3 Encounters:  02/21/20 140/64  01/14/20 (!) 142/70  11/01/19 126/68  Using medication without problems or lightheadedness: none Chest pain with exertion: none Edema:none Short of breath:none Average home BPs: Other issues:  Wt Readings from Last 3 Encounters:  02/21/20 187 lb 8 oz (85 kg)  01/14/20 186 lb 9.6 oz (84.6 kg)  11/01/19 182 lb (82.6 kg)    Elevated Cholesterol:  LDL at goal < 70 on crestor. Lab Results  Component Value Date   CHOL 176 02/18/2020   HDL 94.50 02/18/2020   LDLCALC 62 02/18/2020   LDLDIRECT 81.0 10/16/2010   TRIG 96.0 02/18/2020   CHOLHDL 2 02/18/2020  Using medications without problems: Muscle aches:  Diet compliance: moderate Exercise: minimal Other complaints:  Mild persistent asthma:  Stable control on allergy meds ( xyzal) and singulair. Restart flonase recently. Using albuterol daily prn This visit occurred during the SARS-CoV-2 public health emergency.  Safety protocols were in place, including screening questions prior to the visit, additional usage of staff PPE, and extensive cleaning of exam room while observing appropriate contact time as indicated for disinfecting solutions.   COVID 19 screen:  No recent travel or known exposure to COVID19 The patient denies respiratory symptoms of COVID 19 at this time. The importance of social distancing was discussed today.      ROS    Past Medical History:  Diagnosis Date  . Allergy   . Anxiety    patient denies  . Asthma   . Cataract   . GERD (gastroesophageal reflux disease)   . Glaucoma   . History of echocardiogram    a. 11/2015: EF 55-60%, no RWMA, nl LV diastolic fxn, PASP nl  . Hypertension   . Obesity   . PAF (paroxysmal atrial fibrillation) ([place])    a. CHADS2VASc = 2 (HTN, sex category)  . Sinusitis 2011   Recurrent per Dr. Tami Ribas with [facility]    reports that she has never smoked. She has never used smokeless tobacco. She reports current alcohol use of about 14.0 standard drinks of alcohol per week. She reports that she does not use drugs.   Current Outpatient Medications:  .  albuterol (VENTOLIN HFA) 108 (90 Base) MCG/ACT inhaler, Inhale 2 puffs into the lungs every 4 (four) hours as needed for wheezing or shortness of breath., Disp: 8 g, Rfl: 2 .  apixaban (ELIQUIS) 5 MG TABS tablet, Take 1 tablet (5 mg total) by mouth 2 (two) times daily., Disp: 60 tablet, Rfl: 11 .  bisoprolol (ZEBETA) 5 MG tablet, Take 1 tablet (5 mg total) by mouth daily., Disp: 90 tablet, Rfl: 3 .  cloNIDine (CATAPRES) 0.1 MG tablet, Take 1 tablet (0.1 mg total) by mouth at bedtime., Disp: 90 tablet, Rfl: 1 .  diltiazem (CARDIZEM CD) 240 MG 24 hr capsule, TAKE 1 CAPSULE BY MOUTH DAILY. PLEASE SCHEDULE APPT WITH DR. HOCHREIN FOR REFILLS. [phone]., Disp: 30 capsule, Rfl: 0 .  fluticasone (FLONASE) 50 MCG/ACT nasal spray, Place 1 spray into both nostrils daily as needed for allergies., Disp: 16 g, Rfl: 11 .  hydrALAZINE (APRESOLINE) 25 MG tablet, Take 1 tablet (25 mg total) by mouth 2 (two) times daily., Disp: 180 tablet, Rfl: 3 .  levocetirizine (XYZAL) 5 MG tablet, Take 5 mg by mouth at bedtime. , Disp: , Rfl:  .  metoprolol tartrate (LOPRESSOR) 25 MG tablet, Take 1 tablet (25 mg total) by mouth 2 (two) times daily as needed (AS NEEDED FOR PALPITATIONS/ATRIAL FIBRILATION)., Disp: 180 tablet, Rfl: 3 .   montelukast (SINGULAIR) 10 MG tablet, TAKE 1 TABLET (10 MG TOTAL) BY MOUTH AT BEDTIME., Disp: 90 tablet, Rfl: 0 .  omeprazole (PRILOSEC) 20 MG capsule, Take 20 mg by mouth daily. X 2 capsules, Disp: , Rfl:  .  rosuvastatin (CRESTOR) 20 MG tablet, Take 1 tablet (20 mg total) by mouth daily., Disp: 90 tablet, Rfl: 1 .  spironolactone (ALDACTONE) 50 MG tablet, Take 1 tablet (50 mg total) by mouth daily., Disp: 90 tablet, Rfl: 3   Observations/Objective: Blood pressure 140/64, pulse 67, temperature 98.2 F (36.8 C), temperature source Temporal, height 5' 4.75" (1.645 m), weight 187 lb 8 oz (85 kg), SpO2 98 %.  Physical Exam Constitutional:      General: She is not in acute distress.    Appearance: Normal appearance. She is well-developed. She is obese. She is not ill-appearing or toxic-appearing.  HENT:     Head: Normocephalic.     Right Ear: Hearing, tympanic membrane, ear canal and external ear normal.     Left Ear: Hearing, tympanic membrane, ear canal and external ear normal.     Nose: Nose normal.  Eyes:     General: Lids are normal. Lids are everted, no foreign bodies appreciated.     Conjunctiva/sclera: Conjunctivae normal.     Pupils: Pupils are equal, round, and reactive to light.  Neck:     Thyroid: No thyroid mass or thyromegaly.     Vascular: No carotid bruit.     Trachea: Trachea normal.  Cardiovascular:     Rate and Rhythm: Normal rate. Rhythm irregularly irregular.     Heart sounds: Normal heart sounds, S1 normal and S2 normal. No murmur. No gallop.   Pulmonary:     Effort: Pulmonary effort is normal. No respiratory distress.     Breath sounds: Normal breath sounds. No wheezing, rhonchi or rales.  Abdominal:     General: Bowel sounds are normal. There is no distension or abdominal bruit.     Palpations: Abdomen is soft. There is no fluid wave or mass.     Tenderness: There is no abdominal tenderness. There is no guarding or rebound.     Hernia: No hernia is present.   Musculoskeletal:     Cervical back: Normal range of motion and neck supple.  Lymphadenopathy:     Cervical: No cervical adenopathy.  Skin:    General: Skin is warm and dry.     Findings: No rash.  Neurological:     Mental Status: She is alert.     Cranial Nerves: No cranial nerve deficit.     Sensory: No sensory deficit.  Psychiatric:        Mood and Affect: Mood is not anxious or depressed.        Speech: Speech normal.        Behavior: Behavior normal. Behavior is cooperative.        Judgment: Judgment normal.  Assessment and Plan   The patient's preventative maintenance and recommended screening tests for an annual wellness exam were reviewed in full today. Brought up to date unless services declined.  Counselled on the importance of diet, exercise, and its role in overall health and mortality. The patient's FH and SH was reviewed, including their home life, tobacco status, and drug and alcohol status.    Vaccines:Uptodate  Discussed COVID19 vaccine side effects and benefits. Strongly encouraged the patient to get the vaccine. Questions answered.  Pap/DVE:11/2015 nml pap, neg HPV, repeat In 5 years. No family history of ovarian and uterine cancer Mammo:Due now.. has appt. Bone Density: plan age 61 given mother's history. Colon:1/ 2020 , Dr. Loletha Carrow, plan q 5 year, family history of colon ca in Mom and Dad. Smoking Status: nonsmoker ETOH/ drug EFE:OFHQ 1 glass daily/none Hep C:done HIV screen:Refused.  PURE HYPERCHOLESTEROLEMIA At goal on statin. Encouraged exercise, weight loss, healthy eating habits.   Mild persistent asthma  Stable control on  singulair and Xyzal but if worsening during allergy season she will contact me.  Essential hypertension Borderline control.Marland Kitchen get back on track with lifestyle change... if not improving contact Dr. Percival Spanish to consider med adjustment   Atrial fibrillation with RVR ([place]) Rate controlled and on  anticoagulation.    Eliezer Lofts, MD

## 2020-02-21 NOTE — Assessment & Plan Note (Signed)
Borderline control.Marland Kitchen get back on track with lifestyle change... if not improving contact Dr. Percival Spanish to consider med adjustment

## 2020-02-21 NOTE — Patient Instructions (Addendum)
Work on [employer] and increasing activity and weight loss.

## 2020-02-21 NOTE — Assessment & Plan Note (Signed)
Rate controlled and on anticoagulation.

## 2020-02-21 NOTE — Assessment & Plan Note (Signed)
Stable control on  singulair and Xyzal but if worsening during allergy season she will contact me.

## 2020-02-27 DIAGNOSIS — Z1231 Encounter for screening mammogram for malignant neoplasm of breast: Secondary | ICD-10-CM | POA: Diagnosis not present

## 2020-02-27 LAB — HM MAMMOGRAPHY

## 2020-02-29 ENCOUNTER — Encounter: Payer: Self-pay | Admitting: Family Medicine

## 2020-03-14 ENCOUNTER — Other Ambulatory Visit: Payer: Self-pay

## 2020-03-14 MED ORDER — DILTIAZEM HCL ER COATED BEADS 240 MG PO CP24: ORAL_CAPSULE | ORAL | 2 refills | Status: DC

## 2020-03-17 ENCOUNTER — Other Ambulatory Visit: Payer: Self-pay

## 2020-03-17 MED ORDER — CLONIDINE HCL 0.1 MG PO TABS: 0.1000 mg | ORAL_TABLET | Freq: Every day | ORAL | 1 refills | Status: DC

## 2020-03-17 MED ORDER — DILTIAZEM HCL ER COATED BEADS 240 MG PO CP24: ORAL_CAPSULE | ORAL | 2 refills | Status: DC

## 2020-03-20 MED ORDER — BECLOMETHASONE DIPROPIONATE 80 MCG/ACT IN AERS: 1.0000 | INHALATION_SPRAY | Freq: Two times a day (BID) | RESPIRATORY_TRACT | 12 refills | Status: DC

## 2020-04-07 ENCOUNTER — Other Ambulatory Visit: Payer: Self-pay | Admitting: Family Medicine

## 2020-04-27 ENCOUNTER — Other Ambulatory Visit: Payer: Self-pay | Admitting: Physician Assistant

## 2020-04-27 DIAGNOSIS — R197 Diarrhea, unspecified: Secondary | ICD-10-CM

## 2020-06-02 ENCOUNTER — Other Ambulatory Visit: Payer: Self-pay | Admitting: Family Medicine

## 2020-06-05 DIAGNOSIS — Z20822 Contact with and (suspected) exposure to covid-19: Secondary | ICD-10-CM | POA: Diagnosis not present

## 2020-06-05 DIAGNOSIS — J22 Unspecified acute lower respiratory infection: Secondary | ICD-10-CM | POA: Diagnosis not present

## 2020-07-09 DIAGNOSIS — Z20822 Contact with and (suspected) exposure to covid-19: Secondary | ICD-10-CM

## 2020-07-09 MED ORDER — ALBUTEROL SULFATE HFA 108 (90 BASE) MCG/ACT IN AERS: 2.0000 | INHALATION_SPRAY | RESPIRATORY_TRACT | 2 refills | Status: AC | PRN

## 2020-07-17 NOTE — Telephone Encounter (Signed)
Ok to write work note?

## 2020-07-19 ENCOUNTER — Other Ambulatory Visit: Payer: Self-pay | Admitting: Physician Assistant

## 2020-07-19 DIAGNOSIS — R197 Diarrhea, unspecified: Secondary | ICD-10-CM

## 2020-07-24 MED ORDER — PREDNISONE 20 MG PO TABS: ORAL_TABLET | ORAL | 0 refills | Status: DC

## 2020-07-30 DIAGNOSIS — H6980 Other specified disorders of Eustachian tube, unspecified ear: Secondary | ICD-10-CM | POA: Diagnosis not present

## 2020-07-30 DIAGNOSIS — H6121 Impacted cerumen, right ear: Secondary | ICD-10-CM | POA: Diagnosis not present

## 2020-08-10 ENCOUNTER — Other Ambulatory Visit: Payer: Self-pay | Admitting: Physician Assistant

## 2020-08-11 NOTE — Telephone Encounter (Signed)
This is Dr. Hochrein's pt. °

## 2020-08-13 DIAGNOSIS — H903 Sensorineural hearing loss, bilateral: Secondary | ICD-10-CM | POA: Diagnosis not present

## 2020-08-13 DIAGNOSIS — H6983 Other specified disorders of Eustachian tube, bilateral: Secondary | ICD-10-CM | POA: Diagnosis not present

## 2020-09-13 ENCOUNTER — Other Ambulatory Visit: Payer: Self-pay | Admitting: Physician Assistant

## 2020-11-19 ENCOUNTER — Emergency Department: Payer: BC Managed Care – PPO

## 2020-11-19 ENCOUNTER — Encounter: Payer: Self-pay | Admitting: *Deleted

## 2020-11-19 ENCOUNTER — Other Ambulatory Visit: Payer: Self-pay

## 2020-11-19 DIAGNOSIS — Z5321 Procedure and treatment not carried out due to patient leaving prior to being seen by health care provider: Secondary | ICD-10-CM | POA: Insufficient documentation

## 2020-11-19 DIAGNOSIS — R079 Chest pain, unspecified: Secondary | ICD-10-CM | POA: Diagnosis not present

## 2020-11-19 DIAGNOSIS — R0789 Other chest pain: Secondary | ICD-10-CM | POA: Diagnosis not present

## 2020-11-19 DIAGNOSIS — T17920A Food in respiratory tract, part unspecified causing asphyxiation, initial encounter: Secondary | ICD-10-CM | POA: Diagnosis not present

## 2020-11-19 DIAGNOSIS — I4891 Unspecified atrial fibrillation: Secondary | ICD-10-CM | POA: Diagnosis not present

## 2020-11-19 DIAGNOSIS — X58XXXA Exposure to other specified factors, initial encounter: Secondary | ICD-10-CM | POA: Insufficient documentation

## 2020-11-19 DIAGNOSIS — Y92511 Restaurant or cafe as the place of occurrence of the external cause: Secondary | ICD-10-CM | POA: Insufficient documentation

## 2020-11-19 DIAGNOSIS — R0989 Other specified symptoms and signs involving the circulatory and respiratory systems: Secondary | ICD-10-CM | POA: Diagnosis not present

## 2020-11-19 LAB — CBC
HCT: 37.1 % (ref 36.0–46.0)
Hemoglobin: 12.9 g/dL (ref 12.0–15.0)
MCH: 31.9 pg (ref 26.0–34.0)
MCHC: 34.8 g/dL (ref 30.0–36.0)
MCV: 91.6 fL (ref 80.0–100.0)
Platelets: 253 10*3/uL (ref 150–400)
RBC: 4.05 MIL/uL (ref 3.87–5.11)
RDW: 11.9 % (ref 11.5–15.5)
WBC: 8.4 10*3/uL (ref 4.0–10.5)
nRBC: 0 % (ref 0.0–0.2)

## 2020-11-19 LAB — BASIC METABOLIC PANEL
Anion gap: 12 (ref 5–15)
BUN: 9 mg/dL (ref 6–20)
CO2: 21 mmol/L — ABNORMAL LOW (ref 22–32)
Calcium: 9.8 mg/dL (ref 8.9–10.3)
Chloride: 94 mmol/L — ABNORMAL LOW (ref 98–111)
Creatinine, Ser: 0.61 mg/dL (ref 0.44–1.00)
GFR, Estimated: >60 mL/min (ref >60)
Glucose, Bld: 128 mg/dL — ABNORMAL HIGH (ref 70–99)
Potassium: 4.1 mmol/L (ref 3.5–5.1)
Sodium: 127 mmol/L — ABNORMAL LOW (ref 135–145)

## 2020-11-19 NOTE — ED Triage Notes (Signed)
Pt brought in via ems to triage by wheelchair.  Pt reports while eating chicken tonight, she choked.  Pt had a coughing spell and states it feels like it is hung in the chest area.   No resp distress now.  Pt alert

## 2020-11-19 NOTE — ED Triage Notes (Signed)
EMS brings pt in from local restaurant for c/o "choking on rice"; now with c/o CP (hx afib, acid reflux)

## 2020-11-20 ENCOUNTER — Emergency Department
Admission: EM | Admit: 2020-11-20 | Discharge: 2020-11-20 | Disposition: A | Discharge status: Home / Self Care | Payer: BC Managed Care – PPO | Attending: Emergency Medicine | Admitting: Emergency Medicine

## 2020-11-20 NOTE — ED Notes (Signed)
No answer when called from lobby for vs

## 2020-11-25 DIAGNOSIS — R1310 Dysphagia, unspecified: Secondary | ICD-10-CM | POA: Diagnosis not present

## 2020-11-25 DIAGNOSIS — K219 Gastro-esophageal reflux disease without esophagitis: Secondary | ICD-10-CM | POA: Diagnosis not present

## 2020-11-27 NOTE — Telephone Encounter (Signed)
Please schedule ER follow up with Dr. Diona Browner next week.

## 2020-11-29 ENCOUNTER — Other Ambulatory Visit: Payer: Self-pay | Admitting: Physician Assistant

## 2020-12-04 ENCOUNTER — Telehealth: Payer: Self-pay

## 2020-12-04 ENCOUNTER — Ambulatory Visit: Payer: BC Managed Care – PPO | Admitting: Gastroenterology

## 2020-12-04 ENCOUNTER — Encounter: Payer: Self-pay | Admitting: Gastroenterology

## 2020-12-04 ENCOUNTER — Telehealth: Payer: Self-pay | Admitting: Gastroenterology

## 2020-12-04 ENCOUNTER — Encounter: Payer: Self-pay | Admitting: Cardiology

## 2020-12-04 VITALS — BP 146/70 | HR 66 | Ht 66.0 in | Wt 194.0 lb

## 2020-12-04 DIAGNOSIS — R053 Chronic cough: Secondary | ICD-10-CM

## 2020-12-04 DIAGNOSIS — K219 Gastro-esophageal reflux disease without esophagitis: Secondary | ICD-10-CM | POA: Diagnosis not present

## 2020-12-04 DIAGNOSIS — R042 Hemoptysis: Secondary | ICD-10-CM

## 2020-12-04 DIAGNOSIS — R1319 Other dysphagia: Secondary | ICD-10-CM | POA: Diagnosis not present

## 2020-12-04 NOTE — Telephone Encounter (Signed)
Spoke with Terrah at [facility], she called in regards to patient's cardiac clearance. Patient will need to be seen for a follow up to discuss cardiac clearance, she stated that they do not have any available appointments prior to procedure that is currently scheduled. She states that they are looking at the 2nd week of February for a potential follow up appt. Ellison Carwin has reached out to the patient but has not received a response. Patient will need to reschedule procedures after cardiac follow up. Thanks

## 2020-12-04 NOTE — Telephone Encounter (Signed)
Called patient Amanda Estrada on both her home and mobile numbers listed asking that she give the office call back to get scheduled for a pre-op appointment. Will try calling patient again.

## 2020-12-04 NOTE — Telephone Encounter (Signed)
 [facility] Pre-operative Risk Assessment     Request for surgical clearance:     Endoscopy Procedure  What type of surgery is being performed?     EGD  When is this surgery scheduled?     12-08-2020  What type of clearance is required ?   Pharmacy  Are there any medications that need to be held prior to surgery and how long? Eliquis / 2 days  Practice name and name of physician performing surgery?      [facility]  What is your office phone and fax number?      Phone- [phone]  [phone]  Anesthesia type (None, local, MAC, general) ?       MAC

## 2020-12-04 NOTE — Patient Instructions (Signed)
If you are age 61 or older, your body mass index should be between 23-30. Your Body mass index is 31.31 kg/m. If this is out of the aforementioned range listed, please consider follow up with your Primary Care Provider.  If you are age 47 or younger, your body mass index should be between 19-25. Your Body mass index is 31.31 kg/m. If this is out of the aformentioned range listed, please consider follow up with your Primary Care Provider.   You have been scheduled for an endoscopy. Please follow written instructions given to you at your visit today. If you use inhalers (even only as needed), please bring them with you on the day of your procedure.  You will be contacted by our office prior to your procedure for directions on holding your Eliquis.  If you do not hear from our office 1 week prior to your scheduled procedure, please call [phone] to discuss.   It was a pleasure to see you today!  Dr. Loletha Carrow

## 2020-12-04 NOTE — Telephone Encounter (Signed)
Brooklyn from [facility] returned my call. I informed her that the patient would need an appointment for cardiac clearance. I also informed her that we have openings after already scheduled procedure date.

## 2020-12-04 NOTE — Telephone Encounter (Signed)
Called the requesting office of [facility] and left a message for someone to give me a call back.

## 2020-12-04 NOTE — Progress Notes (Signed)
[facility] Progress Note  Chief Complaint: Dysphagia  Subjective  History:  This is a very pleasant 60 year old woman known to me from prior screening colonoscopy.  For least several months she has had food episodically feels stuck in the chest, and she had not paid much attention.  She has been on twice daily PPI for many years for heartburn that she feels is under good control.  No prior upper endoscopy. In the last few weeks she has had 2 episodes of food impaction when she was eating chicken.  The first episode was quite intense and she felt as if she could not breathe and there was a lot of foam and fluid in her mouth.  She went outside a restaurant and her son attempted to give her the Heimlich maneuver, which did not dislodge the food but injured her right rib cage.  She then went to the [facility] ED where the impaction finally passed.  She had another episode at a [employer] after that, and says the chicken came up as she was going into the bathroom.  Brittin also notes a return of cough with some intermittent small-volume hemoptysis for about the last month.  She tells me these were the initial symptoms when she was diagnosed with a pulmonary/bronchial abnormality in September 2018 (CT scan of that reviewed).  It was apparently ultimately determined to be benign and she was being followed with annual CT scans, last being in November 2020.  Colonoscopy with me for fam Hx CRC Jan 2020 - normal exam  ROS: Cardiovascular:  no chest pain Respiratory: no dyspnea.  Cough hemoptysis as noted above Remainder of systems negative except as above The patient's Past Medical, Family and Social History were reviewed and are on file in the EMR. Past Medical History:  Diagnosis Date  . Allergy   . Anxiety    patient denies  . Asthma   . Cataract   . GERD (gastroesophageal reflux disease)   . Glaucoma   . History of echocardiogram    a. 11/2015: EF 55-60%, no RWMA, nl LV  diastolic fxn, PASP nl  . Hypertension   . Obesity   . PAF (paroxysmal atrial fibrillation) ([place])    a. CHADS2VASc = 2 (HTN, sex category)  . Sinusitis 2011   Recurrent per Dr. Tami Ribas with [facility]   Last cardiology office note from March 2021 reviewed    Past Surgical History:  Procedure Laterality Date  . BUNIONECTOMY  11/2007   Right foot, 2 hammer-toes and tendon replacement (Dr. Paulla Dolly)  . cataract Bilateral 06/2018   narrow angle glaucoma bilateral  . CERVICAL DISCECTOMY  2001   C5/6  . CESAREAN SECTION  1985   Due to incr BP  . COLONOSCOPY    . ENDOMETRIAL BIOPSY  03/27/2008   B9 endometrium w/breakdown changes (Dr, Kinscius)  . LASER BRONCHOSCOPY N/A 09/19/2017   Procedure: LASER BRONCHOSCOPY;  Surgeon: Melrose Nakayama, MD;  Location: [facility] OR;  Service: Thoracic;  Laterality: N/A;  . Lasix eye surgery    . LUNG SURGERY Left 09/19/2017   benign tumor removed  . NSVD  1989   VBAC  . Renal artery [place]  02/2007   [place] arteries but no stenosis  . TIBIA FRACTURE SURGERY Left 12/2017  . US TRANSVAGINAL PELVIC MODIFIED  04/09/2008   Normal  . VIDEO BRONCHOSCOPY Bilateral 08/10/2017   Procedure: VIDEO BRONCHOSCOPY WITHOUT FLUORO;  Surgeon: Tanda Rockers, MD;  Location: [facility];  Service:  Cardiopulmonary;  Laterality: Bilateral;  . VIDEO BRONCHOSCOPY N/A 09/19/2017   Procedure: VIDEO BRONCHOSCOPY, with endobronchial tumor resection;  Surgeon: Melrose Nakayama, MD;  Location: [facility];  Service: Thoracic;  Laterality: N/A;  . VIDEO BRONCHOSCOPY Bilateral 08/16/2018   Procedure: VIDEO BRONCHOSCOPY WITHOUT FLUORO;  Surgeon: Tanda Rockers, MD;  Location: [facility];  Service: Cardiopulmonary;  Laterality: Bilateral;    Objective:  Med list reviewed  Current Outpatient Medications:  .  albuterol (VENTOLIN HFA) 108 (90 Base) MCG/ACT inhaler, Inhale 2 puffs into the lungs every 4 (four) hours as needed for wheezing or shortness of breath., Disp: 8 g, Rfl:  2 .  apixaban (ELIQUIS) 5 MG TABS tablet, Take 1 tablet (5 mg total) by mouth 2 (two) times daily., Disp: 60 tablet, Rfl: 11 .  beclomethasone (QVAR) 80 MCG/ACT inhaler, Inhale 1 puff into the lungs in the morning and at bedtime., Disp: 1 Inhaler, Rfl: 12 .  bisoprolol (ZEBETA) 5 MG tablet, Take 1 tablet (5 mg total) by mouth daily., Disp: 90 tablet, Rfl: 3 .  cloNIDine (CATAPRES) 0.1 MG tablet, TAKE 1 TABLET (0.1 MG TOTAL) BY MOUTH AT BEDTIME., Disp: 90 tablet, Rfl: 1 .  dicyclomine (BENTYL) 10 MG capsule, TAKE 1 CAPSULE (10 MG TOTAL) BY MOUTH 4 (FOUR) TIMES DAILY - BEFORE MEALS AND AT BEDTIME., Disp: 360 capsule, Rfl: 0 .  diltiazem (CARDIZEM CD) 240 MG 24 hr capsule, TAKE 1 CAPSULE BY MOUTH DAILY. PLEASE SCHEDULE APPT WITH DR. HOCHREIN FOR REFILLS. [phone]., Disp: 90 capsule, Rfl: 2 .  fluticasone (FLONASE) 50 MCG/ACT nasal spray, PLACE 1 SPRAY INTO BOTH NOSTRILS DAILY AS NEEDED FOR ALLERGIES., Disp: 48 mL, Rfl: 3 .  hydrALAZINE (APRESOLINE) 25 MG tablet, Take 1 tablet (25 mg total) by mouth 2 (two) times daily., Disp: 180 tablet, Rfl: 3 .  levocetirizine (XYZAL) 5 MG tablet, Take 5 mg by mouth at bedtime. , Disp: , Rfl:  .  montelukast (SINGULAIR) 10 MG tablet, TAKE 1 TABLET (10 MG TOTAL) BY MOUTH AT BEDTIME., Disp: 90 tablet, Rfl: 3 .  omeprazole (PRILOSEC) 20 MG capsule, Take 20 mg by mouth daily. X 2 capsules, Disp: , Rfl:  .  rosuvastatin (CRESTOR) 20 MG tablet, TAKE 1 TABLET BY MOUTH EVERY DAY, Disp: 90 tablet, Rfl: 3 .  spironolactone (ALDACTONE) 50 MG tablet, Take 1 tablet (50 mg total) by mouth daily., Disp: 90 tablet, Rfl: 3 .  metoprolol tartrate (LOPRESSOR) 25 MG tablet, Take 1 tablet (25 mg total) by mouth 2 (two) times daily as needed (AS NEEDED FOR PALPITATIONS/ATRIAL FIBRILATION)., Disp: 180 tablet, Rfl: 3   Vital signs in last 24 hrs: Vitals:   12/04/20 0911  BP: (!) 146/70  Pulse: 66   Wt Readings from Last 3 Encounters:  12/04/20 194 lb (88 kg)  11/19/20 180 lb  (81.6 kg)  02/21/20 187 lb 8 oz (85 kg)    Physical Exam  Well-appearing.  No hoarseness, and she feels her vocal quality has not changed recently.  HEENT: sclera anicteric, oral mucosa moist without lesions  Neck: supple, no thyromegaly, JVD or lymphadenopathy  Cardiac: RRR without murmurs, S1S2 heard, no peripheral edema  Pulm: clear to auscultation bilaterally, normal RR and effort noted  Abdomen: soft, no tenderness, with active bowel sounds. No guarding or palpable hepatosplenomegaly.  Skin; warm and dry, no jaundice or rash  Labs:  CBC Latest Ref Rng & Units 11/19/2020 10/16/2019 12/01/2018  WBC 4.0 - 10.5 K/uL 8.4 6.2 4.8  Hemoglobin 12.0 - 15.0  g/dL 12.9 12.8 13.0  Hematocrit 36.0 - 46.0 % 37.1 38.7 38.1  Platelets 150 - 400 K/uL 253 242.0 272.0   CMP Latest Ref Rng & Units 11/19/2020 02/18/2020 10/16/2019  Glucose 70 - 99 mg/dL 128(H) 98 97  BUN 6 - 20 mg/dL 9 11 9   Creatinine 0.44 - 1.00 mg/dL 0.61 0.73 0.67  Sodium 135 - 145 mmol/L 127(L) 131(L) 131(L)  Potassium 3.5 - 5.1 mmol/L 4.1 4.4 4.1  Chloride 98 - 111 mmol/L 94(L) 97 98  CO2 22 - 32 mmol/L 21(L) 25 24  Calcium 8.9 - 10.3 mg/dL 9.8 9.7 9.5  Total Protein 6.0 - 8.3 g/dL - 6.9 6.6  Total Bilirubin 0.2 - 1.2 mg/dL - 0.5 0.5  Alkaline Phos 39 - 117 U/L - 57 62  AST 0 - 37 U/L - 15 14  ALT 0 - 35 U/L - 17 15    ___________________________________________ Radiologic studies:  CLINICAL DATA:  Choking episode while eating chicken   EXAM: CHEST - 2 VIEW   COMPARISON:  None.   FINDINGS: The heart size and mediastinal contours are within normal limits. Both lungs are clear. The visualized skeletal structures are unremarkable.   IMPRESSION: No visible foreign body.     Electronically Signed   By: Ulyses Jarred M.D.   On: 11/19/2020 22:13  ____________________________________________ Other:  Last echo Jan 2017:  - Left ventricle: The cavity size was normal. Systolic function was    normal. The  estimated ejection fraction was in the range of 55%    to 60%. Wall motion was normal; there were no regional wall    motion abnormalities. Left ventricular diastolic function    parameters were normal.  - Left atrium: The atrium was normal in size.  - Right ventricle: Systolic function was normal.  - Pulmonary arteries: Systolic pressure was within the normal    range.   Impressions:   - Normal study.  ____________________________  11/25/2020 office note by her ENT physician, Dr. Beverly Gust, was reviewed.  Fiberoptic exam of the hypopharynx and larynx was normal.  _____________________________________________ Assessment & Plan  Assessment: Encounter Diagnoses  Name Primary?  . Esophageal dysphagia Yes  . Gastroesophageal reflux disease, unspecified whether esophagitis present   . Chronic cough   . Hemoptysis    I suspect she has a reflux induced esophageal stricture causing dysphagia.  I recommended upper endoscopy with dilation and she was agreeable after discussion of procedure and risks.  The benefits and risks of the planned procedure were described in detail with the patient or (when appropriate) their health care proxy.  Risks were outlined as including, but not limited to, bleeding, infection, perforation, adverse medication reaction leading to cardiac or pulmonary decompensation, pancreatitis (if ERCP).  The limitation of incomplete mucosal visualization was also discussed.  No guarantees or warranties were given.  Patient at increased risk for cardiopulmonary complications of procedure due to medical comorbidities. She needs to be off Eliquis about 36 hours prior to procedure, and with her relatively low CHADS2 score I suspect that we will be agreeable to cardiology.  We will message Dr. Percival Spanish about this.  In addition, I will send my note to Drs. Bedsole and Wert so they can proceed accordingly regarding Raylie's recent cough and hemoptysis.   Nelida Meuse III

## 2020-12-04 NOTE — Telephone Encounter (Signed)
Error

## 2020-12-04 NOTE — Telephone Encounter (Signed)
Primary Cardiologist:James Hochrein, MD  Chart reviewed as part of pre-operative protocol coverage. Because of NIXON SPARR past medical history and time since last visit, he/she will require a follow-up visit in order to better assess preoperative cardiovascular risk.  Pre-op covering staff: - Please schedule appointment and call patient to inform them. - Please contact requesting surgeon's office via preferred method (i.e, phone, fax) to inform them of need for appointment prior to surgery.  If applicable, this message will also be routed to pharmacy pool and/or primary cardiologist for input on holding anticoagulant/antiplatelet agent as requested below so that this information is available at time of patient's appointment.   Deberah Pelton, NP  12/04/2020, 3:41 PM

## 2020-12-04 NOTE — Telephone Encounter (Signed)
Patient with diagnosis of ATRIAL FIBRILLATION on ELIQUIS for anticoagulation.    Procedure: EGD  Date of procedure: January/31/2022   CHA2DS2-VASc Score = 2  This indicates a 2.2% annual risk of stroke. The patient's score is based upon: CHF History: No HTN History: Yes Diabetes History: No Stroke History: No Vascular Disease History: No Age Score: 0 Gender Score: 1      CrCl >100ML/MIN Platelet count 253  Per office protocol, patient can hold ELIQUIS for 2 days prior to procedure.    Please resume as soon as possible after procedure.

## 2020-12-05 NOTE — Telephone Encounter (Signed)
Pt has been notified. Procedure has been moved to 01-05-2021. She will call and get an appointment with cardiology.

## 2020-12-08 ENCOUNTER — Encounter: Payer: BC Managed Care – PPO | Admitting: Gastroenterology

## 2020-12-08 NOTE — Telephone Encounter (Signed)
Office visit for clearance schedule 02/16 with Dr Percival Spanish

## 2020-12-08 NOTE — Progress Notes (Signed)
Shiryl is already scheduled to see Dr. Diona Browner on 12/09/2020.

## 2020-12-09 ENCOUNTER — Ambulatory Visit: Payer: BC Managed Care – PPO | Admitting: Family Medicine

## 2020-12-09 ENCOUNTER — Other Ambulatory Visit: Payer: Self-pay

## 2020-12-09 VITALS — BP 160/66 | HR 65 | Temp 97.8°F | Ht 66.0 in | Wt 195.5 lb

## 2020-12-09 DIAGNOSIS — R059 Cough, unspecified: Secondary | ICD-10-CM

## 2020-12-09 DIAGNOSIS — I10 Essential (primary) hypertension: Secondary | ICD-10-CM | POA: Diagnosis not present

## 2020-12-09 DIAGNOSIS — J4530 Mild persistent asthma, uncomplicated: Secondary | ICD-10-CM

## 2020-12-09 DIAGNOSIS — R042 Hemoptysis: Secondary | ICD-10-CM

## 2020-12-09 MED ORDER — BECLOMETHASONE DIPROPIONATE 80 MCG/ACT IN AERS: 1.0000 | INHALATION_SPRAY | Freq: Two times a day (BID) | RESPIRATORY_TRACT | 12 refills | Status: DC

## 2020-12-09 NOTE — Patient Instructions (Addendum)
Restart Qvar 80 mcg 1 puff twice daily for possible asthma as cause of cough recurrence.  Continue Xyzal and singulair.  Call if if progressive cough, continue bloody sputum and any shortness of breath.

## 2020-12-09 NOTE — Progress Notes (Signed)
Patient ID: Amanda Estrada, female    DOB: August 21, 1961, 60 y.o.   MRN: [id number]  This visit was conducted in person.  BP (!) 160/66   Pulse 65   Temp 97.8 F (36.6 C) (Temporal)   Ht 5\' 6"  (1.676 m)   Wt 195 lb 8 oz (88.7 kg)   SpO2 98%   BMI 31.55 kg/m    CC:  Chief Complaint  Patient presents with  . Cough    Chronic   . Hemoptysis    Subjective:   HPI: Amanda Estrada is a 60 y.o. female with history of endobronchial tumor resection and laser ablation in 2018, path showed benign bronchial papillary adenoma, HTN, OSA and chronic cough likely due to asthma.  presenting on 12/09/2020 for Cough (Chronic/) and Hemoptysis She has noted a recurrence of the dry cough, occ mucus streaked with blood.  No blood in mucus. In last week. She has noted it in last month. Noted after she had her second COVID vaccine.  She is currently having dysphagia evaluated by GI. Recent endoscopy Dr. Loletha Carrow.  She is on Singuliar, Xyzal and albuterol prn ( has not had to use in a long time).  Has not been using the Qvar   She is on Eliquis given afib.  11/19/2020 CXR clear.   She has not yet taken BP med today.   BP Readings from Last 3 Encounters:  12/09/20 (!) 160/66  12/04/20 (!) 146/70  11/20/20 (!) 165/75     Relevant past medical, surgical, family and social history reviewed and updated as indicated. Interim medical history since our last visit reviewed. Allergies and medications reviewed and updated. Outpatient Medications Prior to Visit  Medication Sig Dispense Refill  . albuterol (VENTOLIN HFA) 108 (90 Base) MCG/ACT inhaler Inhale 2 puffs into the lungs every 4 (four) hours as needed for wheezing or shortness of breath. 8 g 2  . apixaban (ELIQUIS) 5 MG TABS tablet Take 1 tablet (5 mg total) by mouth 2 (two) times daily. 60 tablet 11  . beclomethasone (QVAR) 80 MCG/ACT inhaler Inhale 1 puff into the lungs in the morning and at bedtime. 1 Inhaler 12  . bisoprolol (ZEBETA) 5  MG tablet Take 1 tablet (5 mg total) by mouth daily. 90 tablet 3  . cloNIDine (CATAPRES) 0.1 MG tablet TAKE 1 TABLET (0.1 MG TOTAL) BY MOUTH AT BEDTIME. 90 tablet 1  . dicyclomine (BENTYL) 10 MG capsule TAKE 1 CAPSULE (10 MG TOTAL) BY MOUTH 4 (FOUR) TIMES DAILY - BEFORE MEALS AND AT BEDTIME. 360 capsule 0  . diltiazem (CARDIZEM CD) 240 MG 24 hr capsule TAKE 1 CAPSULE BY MOUTH DAILY. PLEASE SCHEDULE APPT WITH DR. HOCHREIN FOR REFILLS. [phone]. 90 capsule 2  . fluticasone (FLONASE) 50 MCG/ACT nasal spray PLACE 1 SPRAY INTO BOTH NOSTRILS DAILY AS NEEDED FOR ALLERGIES. 48 mL 3  . hydrALAZINE (APRESOLINE) 25 MG tablet Take 1 tablet (25 mg total) by mouth 2 (two) times daily. 180 tablet 3  . levocetirizine (XYZAL) 5 MG tablet Take 5 mg by mouth at bedtime.     . montelukast (SINGULAIR) 10 MG tablet TAKE 1 TABLET (10 MG TOTAL) BY MOUTH AT BEDTIME. 90 tablet 3  . omeprazole (PRILOSEC) 20 MG capsule Take 20 mg by mouth daily. X 2 capsules    . rosuvastatin (CRESTOR) 20 MG tablet TAKE 1 TABLET BY MOUTH EVERY DAY 90 tablet 3  . spironolactone (ALDACTONE) 50 MG tablet Take 1 tablet (50 mg total) by  mouth daily. 90 tablet 3  . metoprolol tartrate (LOPRESSOR) 25 MG tablet Take 1 tablet (25 mg total) by mouth 2 (two) times daily as needed (AS NEEDED FOR PALPITATIONS/ATRIAL FIBRILATION). 180 tablet 3   No facility-administered medications prior to visit.     Per HPI unless specifically indicated in ROS section below Review of Systems  Constitutional: Negative for fatigue and fever.  HENT: Negative for congestion.   Eyes: Negative for pain.  Respiratory: Positive for cough. Negative for shortness of breath.   Cardiovascular: Negative for chest pain, palpitations and leg swelling.  Gastrointestinal: Negative for abdominal pain.  Genitourinary: Negative for dysuria and vaginal bleeding.  Musculoskeletal: Negative for back pain.  Neurological: Negative for syncope, light-headedness and headaches.   Psychiatric/Behavioral: Negative for dysphoric mood.   Objective:  BP (!) 160/66   Pulse 65   Temp 97.8 F (36.6 C) (Temporal)   Ht 5\' 6"  (1.676 m)   Wt 195 lb 8 oz (88.7 kg)   SpO2 98%   BMI 31.55 kg/m   Wt Readings from Last 3 Encounters:  12/09/20 195 lb 8 oz (88.7 kg)  12/04/20 194 lb (88 kg)  11/19/20 180 lb (81.6 kg)      Physical Exam Constitutional:      General: She is not in acute distress.    Appearance: Normal appearance. She is well-developed. She is not ill-appearing or toxic-appearing.  HENT:     Head: Normocephalic.     Right Ear: Hearing, tympanic membrane, ear canal and external ear normal. Tympanic membrane is not erythematous, retracted or bulging.     Left Ear: Hearing, tympanic membrane, ear canal and external ear normal. Tympanic membrane is not erythematous, retracted or bulging.     Nose: No mucosal edema or rhinorrhea.     Right Sinus: No maxillary sinus tenderness or frontal sinus tenderness.     Left Sinus: No maxillary sinus tenderness or frontal sinus tenderness.     Mouth/Throat:     Pharynx: Uvula midline.  Eyes:     General: Lids are normal. Lids are everted, no foreign bodies appreciated.     Conjunctiva/sclera: Conjunctivae normal.     Pupils: Pupils are equal, round, and reactive to light.  Neck:     Thyroid: No thyroid mass or thyromegaly.     Vascular: No carotid bruit.     Trachea: Trachea normal.  Cardiovascular:     Rate and Rhythm: Normal rate and regular rhythm.     Pulses: Normal pulses.     Heart sounds: Normal heart sounds, S1 normal and S2 normal. No murmur heard. No friction rub. No gallop.   Pulmonary:     Effort: Pulmonary effort is normal. No tachypnea or respiratory distress.     Breath sounds: Normal breath sounds. No decreased breath sounds, wheezing, rhonchi or rales.  Abdominal:     General: Bowel sounds are normal.     Palpations: Abdomen is soft.     Tenderness: There is no abdominal tenderness.   Musculoskeletal:     Cervical back: Normal range of motion and neck supple.  Skin:    General: Skin is warm and dry.     Findings: No rash.  Neurological:     Mental Status: She is alert.  Psychiatric:        Mood and Affect: Mood is not anxious or depressed.        Speech: Speech normal.        Behavior: Behavior normal. Behavior is cooperative.  Thought Content: Thought content normal.        Judgment: Judgment normal.       Results for orders placed or performed during the hospital encounter of 61/44/31  Basic metabolic panel  Result Value Ref Range   Sodium 127 (L) 135 - 145 mmol/L   Potassium 4.1 3.5 - 5.1 mmol/L   Chloride 94 (L) 98 - 111 mmol/L   CO2 21 (L) 22 - 32 mmol/L   Glucose, Bld 128 (H) 70 - 99 mg/dL   BUN 9 6 - 20 mg/dL   Creatinine, Ser 0.61 0.44 - 1.00 mg/dL   Calcium 9.8 8.9 - 10.3 mg/dL   GFR, Estimated >60 >60 mL/min   Anion gap 12 5 - 15  CBC  Result Value Ref Range   WBC 8.4 4.0 - 10.5 K/uL   RBC 4.05 3.87 - 5.11 MIL/uL   Hemoglobin 12.9 12.0 - 15.0 g/dL   HCT 37.1 36.0 - 46.0 %   MCV 91.6 80.0 - 100.0 fL   MCH 31.9 26.0 - 34.0 pg   MCHC 34.8 30.0 - 36.0 g/dL   RDW 11.9 11.5 - 15.5 %   Platelets 253 150 - 400 K/uL   nRBC 0.0 0.0 - 0.2 %    This visit occurred during the SARS-CoV-2 public health emergency.  Safety protocols were in place, including screening questions prior to the visit, additional usage of staff PPE, and extensive cleaning of exam room while observing appropriate contact time as indicated for disinfecting solutions.   COVID 19 screen:  No recent travel or known exposure to COVID19 The patient denies respiratory symptoms of COVID 19 at this time. The importance of social distancing was discussed today.   Assessment and Plan  Chronic cough, small amount of hemoptysis, hx of endobrachial tumor:  Given allergy and asthma symptoms...  Restart Qvar 80 mcg 1 puff twice daily for possible asthma as cause of cough recurrence.   Continue Xyzal and singulair.  Call if if progressive cough, continue bloody sputum and any shortness of breath.     Eliezer Lofts, MD

## 2020-12-23 NOTE — Progress Notes (Signed)
Cardiology Office Note:    Date:  12/24/2020   ID:  Amanda Estrada, DOB 1961/05/17, MRN [id number]  PCP:  Jinny Sanders, MD  Cardiologist:  Minus Breeding, MD   Referring MD: Jinny Sanders, MD   Chief Complaint  Patient presents with  . Pre-op Exam    History of Present Illness:    Amanda Estrada is a 60 y.o. female with a hx of atrial fibrillation anticoagulated with eliquis, GERD, HTN, and obesity. Previously on PRN lopressor. She takes clonidine at night, can't tolerate BID due to side effects. She has coronary calcium on CT scan. POET was ordered, but she suffered a leg fracture. Hx of hyponatremia - suggested pausing spironolactone to see if this helps, but she declined. She has been worried about hemoptysis with eliquis, but this has cleared since having a lung tumor resected - benign. She last saw Rosaria Ferries PA-C 01/14/20 and was doing well at that time. BP was elevated, ARB had been stopped. Hydralazine started at 25 mg BID.    She presents for preop clearance for EGD and esophageal dilation. She was recently seen in the ER for dysphagia. At that visit, Na was 127 - however, she states she was in distress. She is normally on the low side for sodium - she eats salt but does drink a lot of water. She has been drinking gatorade zero since ER visit. Stopping spiro has been suggested to see if this helps her Na. I will collect BMP today.    Past Medical History:  Diagnosis Date  . Allergy   . Anxiety    patient denies  . Asthma   . Cataract   . GERD (gastroesophageal reflux disease)   . Glaucoma   . History of echocardiogram    a. 11/2015: EF 55-60%, no RWMA, nl LV diastolic fxn, PASP nl  . Hypertension   . Obesity   . PAF (paroxysmal atrial fibrillation) ([place])    a. CHADS2VASc = 2 (HTN, sex category)  . Sinusitis 2011   Recurrent per Dr. Tami Ribas with [facility]    Past Surgical History:  Procedure Laterality Date  . BUNIONECTOMY  11/2007   Right foot, 2  hammer-toes and tendon replacement (Dr. Paulla Dolly)  . cataract Bilateral 06/2018   narrow angle glaucoma bilateral  . CERVICAL DISCECTOMY  2001   C5/6  . CESAREAN SECTION  1985   Due to incr BP  . COLONOSCOPY    . ENDOMETRIAL BIOPSY  03/27/2008   B9 endometrium w/breakdown changes (Dr, Kinscius)  . LASER BRONCHOSCOPY N/A 09/19/2017   Procedure: LASER BRONCHOSCOPY;  Surgeon: Melrose Nakayama, MD;  Location: [facility] OR;  Service: Thoracic;  Laterality: N/A;  . Lasix eye surgery    . LUNG SURGERY Left 09/19/2017   benign tumor removed  . NSVD  1989   VBAC  . Renal artery [place]  02/2007   [place] arteries but no stenosis  . TIBIA FRACTURE SURGERY Left 12/2017  . US TRANSVAGINAL PELVIC MODIFIED  04/09/2008   Normal  . VIDEO BRONCHOSCOPY Bilateral 08/10/2017   Procedure: VIDEO BRONCHOSCOPY WITHOUT FLUORO;  Surgeon: Tanda Rockers, MD;  Location: [facility];  Service: Cardiopulmonary;  Laterality: Bilateral;  . VIDEO BRONCHOSCOPY N/A 09/19/2017   Procedure: VIDEO BRONCHOSCOPY, with endobronchial tumor resection;  Surgeon: Melrose Nakayama, MD;  Location: [facility];  Service: Thoracic;  Laterality: N/A;  . VIDEO BRONCHOSCOPY Bilateral 08/16/2018   Procedure: VIDEO BRONCHOSCOPY WITHOUT FLUORO;  Surgeon: Tanda Rockers, MD;  Location: [facility];  Service: Cardiopulmonary;  Laterality: Bilateral;    Current Medications: Current Meds  Medication Sig  . albuterol (VENTOLIN HFA) 108 (90 Base) MCG/ACT inhaler Inhale 2 puffs into the lungs every 4 (four) hours as needed for wheezing or shortness of breath.  Marland Kitchen apixaban (ELIQUIS) 5 MG TABS tablet Take 1 tablet (5 mg total) by mouth 2 (two) times daily.  . beclomethasone (QVAR) 80 MCG/ACT inhaler Inhale 1 puff into the lungs in the morning and at bedtime.  . bisoprolol (ZEBETA) 5 MG tablet Take 1 tablet (5 mg total) by mouth daily.  . cloNIDine (CATAPRES) 0.1 MG tablet TAKE 1 TABLET (0.1 MG TOTAL) BY MOUTH AT BEDTIME.  Marland Kitchen dicyclomine (BENTYL) 10  MG capsule TAKE 1 CAPSULE (10 MG TOTAL) BY MOUTH 4 (FOUR) TIMES DAILY - BEFORE MEALS AND AT BEDTIME.  Marland Kitchen diltiazem (CARDIZEM CD) 240 MG 24 hr capsule TAKE 1 CAPSULE BY MOUTH DAILY. PLEASE SCHEDULE APPT WITH DR. HOCHREIN FOR REFILLS. [phone].  . fluticasone (FLONASE) 50 MCG/ACT nasal spray PLACE 1 SPRAY INTO BOTH NOSTRILS DAILY AS NEEDED FOR ALLERGIES.  . hydrALAZINE (APRESOLINE) 25 MG tablet Take 1 tablet (25 mg total) by mouth 2 (two) times daily.  Marland Kitchen levocetirizine (XYZAL) 5 MG tablet Take 5 mg by mouth at bedtime.   . montelukast (SINGULAIR) 10 MG tablet TAKE 1 TABLET (10 MG TOTAL) BY MOUTH AT BEDTIME.  Marland Kitchen omeprazole (PRILOSEC) 20 MG capsule Take 20 mg by mouth daily. X 2 capsules  . rosuvastatin (CRESTOR) 20 MG tablet TAKE 1 TABLET BY MOUTH EVERY DAY  . spironolactone (ALDACTONE) 50 MG tablet Take 1 tablet (50 mg total) by mouth daily.     Allergies:   Amoxicillin-pot clavulanate   Social History   Socioeconomic History  . Marital status: Married    Spouse name: Not on file  . Number of children: 2  . Years of education: Not on file  . Highest education level: Not on file  Occupational History  . Occupation: Air cabin crew, Engineer, materials: [employer]  Tobacco Use  . Smoking status: Never Smoker  . Smokeless tobacco: Never Used  Vaping Use  . Vaping Use: Never used  Substance and Sexual Activity  . Alcohol use: Yes    Alcohol/week: 14.0 standard drinks    Types: 14 Glasses of wine per week    Comment: 2 glasses of wine every day  . Drug use: No  . Sexual activity: Not on file  Other Topics Concern  . Not on file  Social History Narrative   No regular exercise, used to but unable to do given chronic cough.   Social Determinants of Health   Financial Resource Strain: Not on file  Food Insecurity: Not on file  Transportation Needs: Not on file  Physical Activity: Not on file  Stress: Not on file  Social Connections: Not on file      Family History: The patient's family history includes Cancer - Colon (age of onset: 71) in her mother; Colon cancer in her mother; Colon cancer (age of onset: 28) in her father; Colon polyps in her brother, sister, sister, and sister; Heart disease (age of onset: 30) in her father; Hypertension in her brother, father, and sister; Melanoma in her mother; Pneumonia in her father. There is no history of Esophageal cancer, Rectal cancer, or Stomach cancer.  ROS:   Please see the history of present illness.     All other systems reviewed and  are negative.  EKGs/Labs/Other Studies Reviewed:    The following studies were reviewed today:  Echo 2017 Study Conclusions   - Left ventricle: The cavity size was normal. Systolic function was  normal. The estimated ejection fraction was in the range of 55%  to 60%. Wall motion was normal; there were no regional wall  motion abnormalities. Left ventricular diastolic function  parameters were normal.  - Left atrium: The atrium was normal in size.  - Right ventricle: Systolic function was normal.  - Pulmonary arteries: Systolic pressure was within the normal  range.   EKG:  EKG is  ordered today.  The ekg ordered today demonstrates sinus rhythm HR 70 - stable from prior  Recent Labs: 02/18/2020: ALT 17 11/19/2020: BUN 9; Creatinine, Ser 0.61; Hemoglobin 12.9; Platelets 253; Potassium 4.1; Sodium 127  Recent Lipid Panel    Component Value Date/Time   CHOL 176 02/18/2020 1114   TRIG 96.0 02/18/2020 1114   HDL 94.50 02/18/2020 1114   CHOLHDL 2 02/18/2020 1114   VLDL 19.2 02/18/2020 1114   LDLCALC 62 02/18/2020 1114   LDLDIRECT 81.0 10/16/2010 0939    Physical Exam:    VS:  BP 112/70   Pulse 70   Ht 5\' 6"  (1.676 m)   Wt 194 lb (88 kg)   SpO2 99%   BMI 31.31 kg/m     Wt Readings from Last 3 Encounters:  12/24/20 194 lb (88 kg)  12/09/20 195 lb 8 oz (88.7 kg)  12/04/20 194 lb (88 kg)     GEN: Well nourished, well developed  in no acute distress HEENT: Normal NECK: No JVD; No carotid bruits LYMPHATICS: No lymphadenopathy CARDIAC: RRR, no murmurs, rubs, gallops RESPIRATORY:  Clear to auscultation without rales, wheezing or rhonchi  ABDOMEN: Soft, non-tender, non-distended MUSCULOSKELETAL:  No edema; No deformity  SKIN: Warm and dry NEUROLOGIC:  Alert and oriented x 3 PSYCHIATRIC:  Normal affect   ASSESSMENT:    1. Pre-operative clearance   2. PAF (paroxysmal atrial fibrillation) ([place])   3. Essential hypertension   4. Chronic anticoagulation   5. Hyponatremia   6. Atherosclerosis of native coronary artery of native heart without angina pectoris    PLAN:    In order of problems listed above:  PAF Chronic anticoagulation - continue eliquis - continue cardizem - PRN lopressor - has not taken - she has Afib about twice per year and is symptomatic with it --> will watch for Afib after procedure   Hypertension - clonidine at night, hydralazine 25 mg BID, spiro 50 mg, cardizem 240 mg, bisoprolol 5 mg - pressure is perfect today   Hyperlipidemia with LDL goal < 70 - 02/18/2020: Cholesterol 176; HDL 94.50; LDL Cholesterol 62; Triglycerides 96.0; VLDL 19.2 Continue 20 mg crestor.    Hyponatremia - Na generally in the low 130s - drinks a lot of water, tries to increase sodium has been drinking gatorade - will collect BMP today - if sodium under 130 --> stop spiro and increase hydralazine to 50 mg BID and keep BP log   Coronary calcium - seen on CT scan - mobility is limited, unable to complete 4.0 METS   Preop clearance She has calcium on CT scan, but no symptoms. She has excellent control of her BP and lipids. She can complete more than 4.0 METS without angina - actually climbed 2 flights of stairs to this appt. She is acceptable risk for this low risk procedure. OK to hold eliquis for 2 days prior  to procedure. Resume after when safe per surgeon.   Follow up in 1 year.    Medication  Adjustments/Labs and Tests Ordered: Current medicines are reviewed at length with the patient today.  Concerns regarding medicines are outlined above.  Orders Placed This Encounter  Procedures  . Basic metabolic panel  . EKG 12-Lead   No orders of the defined types were placed in this encounter.   Signed, Ledora Bottcher, [place]  12/24/2020 11:07 AM    [place]

## 2020-12-24 ENCOUNTER — Encounter: Payer: Self-pay | Admitting: Physician Assistant

## 2020-12-24 ENCOUNTER — Ambulatory Visit: Payer: BC Managed Care – PPO | Admitting: Physician Assistant

## 2020-12-24 ENCOUNTER — Other Ambulatory Visit: Payer: Self-pay

## 2020-12-24 VITALS — BP 112/70 | HR 70 | Ht 66.0 in | Wt 194.0 lb

## 2020-12-24 DIAGNOSIS — I480 Paroxysmal atrial fibrillation: Secondary | ICD-10-CM

## 2020-12-24 DIAGNOSIS — Z01818 Encounter for other preprocedural examination: Secondary | ICD-10-CM | POA: Diagnosis not present

## 2020-12-24 DIAGNOSIS — Z7901 Long term (current) use of anticoagulants: Secondary | ICD-10-CM

## 2020-12-24 DIAGNOSIS — I10 Essential (primary) hypertension: Secondary | ICD-10-CM

## 2020-12-24 DIAGNOSIS — I2510 Atherosclerotic heart disease of native coronary artery without angina pectoris: Secondary | ICD-10-CM

## 2020-12-24 DIAGNOSIS — E871 Hypo-osmolality and hyponatremia: Secondary | ICD-10-CM

## 2020-12-24 LAB — BASIC METABOLIC PANEL
BUN/Creatinine Ratio: 12 (ref 12–28)
BUN: 8 mg/dL (ref 8–27)
CO2: 21 mmol/L (ref 20–29)
Calcium: 9.9 mg/dL (ref 8.7–10.3)
Chloride: 96 mmol/L (ref 96–106)
Creatinine, Ser: 0.68 mg/dL (ref 0.57–1.00)
GFR calc Af Amer: 110 mL/min/{1.73_m2} (ref >59)
GFR calc non Af Amer: 95 mL/min/{1.73_m2} (ref >59)
Glucose: 94 mg/dL (ref 65–99)
Potassium: 5.1 mmol/L (ref 3.5–5.2)
Sodium: 133 mmol/L — ABNORMAL LOW (ref 134–144)

## 2020-12-24 NOTE — Patient Instructions (Signed)
Medication Instructions:  No Changes *If you need a refill on your cardiac medications before your next appointment, please call your pharmacy*   Lab Work: [facility] If you have labs (blood work) drawn today and your tests are completely normal, you will receive your results only by: Marland Kitchen MyChart Message (if you have MyChart) OR . A paper copy in the mail If you have any lab test that is abnormal or we need to change your treatment, we will call you to review the results.   Testing/Procedures: No Testing   Follow-Up: At [facility], you and your health needs are our priority.  As part of our continuing mission to provide you with exceptional heart care, we have created designated Provider Care Teams.  These Care Teams include your primary Cardiologist (physician) and Advanced Practice Providers (APPs -  Physician Assistants and Nurse Practitioners) who all work together to provide you with the care you need, when you need it.  Your next appointment:   1 year(s)  The format for your next appointment:   In Person  Provider:   Minus Breeding, MD

## 2020-12-25 NOTE — Telephone Encounter (Signed)
Left message to return call 

## 2021-01-05 ENCOUNTER — Ambulatory Visit (AMBULATORY_SURGERY_CENTER): Payer: BC Managed Care – PPO | Admitting: Gastroenterology

## 2021-01-05 ENCOUNTER — Other Ambulatory Visit: Payer: Self-pay

## 2021-01-05 ENCOUNTER — Encounter: Payer: Self-pay | Admitting: Gastroenterology

## 2021-01-05 VITALS — BP 147/62 | HR 58 | Temp 97.5°F | Resp 16 | Ht 66.0 in | Wt 194.0 lb

## 2021-01-05 DIAGNOSIS — K219 Gastro-esophageal reflux disease without esophagitis: Secondary | ICD-10-CM | POA: Diagnosis not present

## 2021-01-05 DIAGNOSIS — R1319 Other dysphagia: Secondary | ICD-10-CM | POA: Diagnosis not present

## 2021-01-05 DIAGNOSIS — K449 Diaphragmatic hernia without obstruction or gangrene: Secondary | ICD-10-CM | POA: Diagnosis not present

## 2021-01-05 DIAGNOSIS — K222 Esophageal obstruction: Secondary | ICD-10-CM | POA: Diagnosis not present

## 2021-01-05 DIAGNOSIS — R1310 Dysphagia, unspecified: Secondary | ICD-10-CM | POA: Diagnosis not present

## 2021-01-05 MED ORDER — OMEPRAZOLE 40 MG PO CPDR: 40.0000 mg | DELAYED_RELEASE_CAPSULE | Freq: Two times a day (BID) | ORAL | 1 refills | Status: DC

## 2021-01-05 MED ORDER — SODIUM CHLORIDE 0.9 % IV SOLN: 500.0000 mL | Freq: Once | INTRAVENOUS | Status: DC

## 2021-01-05 NOTE — Progress Notes (Signed)
Called to room to assist during endoscopic procedure.  Patient ID and intended procedure confirmed with present staff. Received instructions for my participation in the procedure from the performing physician.  

## 2021-01-05 NOTE — Patient Instructions (Addendum)
FOLLOW DILATATION DIET GIVEN TO YOU TODAY  RESUME ELIQUIS AT [facility] (01/06/21)  START OMEPRAZOLE 40 MG TWICE DAILY FOR 8 WEEKS    HANDOUT ON GASTRIC REFLUX & DILATATION DIET FOR TODAY G IVEN TO YOU TODAY  RETURN TO OFFICE -APPOINTMENT TO BE SCHEDULED   YOU HAD AN ENDOSCOPIC PROCEDURE TODAY AT [facility]:   Refer to the procedure report that was given to you for any specific questions about what was found during the examination.  If the procedure report does not answer your questions, please call your gastroenterologist to clarify.  If you requested that your care partner not be given the details of your procedure findings, then the procedure report has been included in a sealed envelope for you to review at your convenience later.  YOU SHOULD EXPECT: Some feelings of bloating in the abdomen. Passage of more gas than usual.  Walking can help get rid of the air that was put into your GI tract during the procedure and reduce the bloating. If you had a lower endoscopy (such as a colonoscopy or flexible sigmoidoscopy) you may notice spotting of blood in your stool or on the toilet paper. If you underwent a bowel prep for your procedure, you may not have a normal bowel movement for a few days.  Please Note:  You might notice some irritation and congestion in your nose or some drainage.  This is from the oxygen used during your procedure.  There is no need for concern and it should clear up in a day or so.  SYMPTOMS TO REPORT IMMEDIATELY:   Following lower endoscopy (colonoscopy or flexible sigmoidoscopy):  Excessive amounts of blood in the stool  Significant tenderness or worsening of abdominal pains  Swelling of the abdomen that is new, acute  Fever of 100F or higher   Following upper endoscopy (EGD)  Vomiting of blood or coffee ground material  New chest pain or pain under the shoulder blades  Painful or persistently difficult swallowing  New shortness  of breath  Fever of 100F or higher  Black, tarry-looking stools  For urgent or emergent issues, a gastroenterologist can be reached at any hour by calling [phone]. Do not use MyChart messaging for urgent concerns.    DIET:  We do recommend a small meal at first, but then you may proceed to your regular diet.  Drink plenty of fluids but you should avoid alcoholic beverages for 24 hours.  ACTIVITY:  You should plan to take it easy for the rest of today and you should NOT DRIVE or use heavy machinery until tomorrow (because of the sedation medicines used during the test).    FOLLOW UP: Our staff will call the number listed on your records 48-72 hours following your procedure to check on you and address any questions or concerns that you may have regarding the information given to you following your procedure. If we do not reach you, we will leave a message.  We will attempt to reach you two times.  During this call, we will ask if you have developed any symptoms of COVID 19. If you develop any symptoms (ie: fever, flu-like symptoms, shortness of breath, cough etc.) before then, please call [phone].  If you test positive for Covid 19 in the 2 weeks post procedure, please call and report this information to [place].    If any biopsies were taken you will be contacted by phone or by letter within the next  1-3 weeks.  Please call us at [phone] if you have not heard about the biopsies in 3 weeks.    SIGNATURES/CONFIDENTIALITY: You and/or your care partner have signed paperwork which will be entered into your electronic medical record.  These signatures attest to the fact that that the information above on your After Visit Summary has been reviewed and is understood.  Full responsibility of the confidentiality of this discharge information lies with you and/or your care-partner.

## 2021-01-05 NOTE — Progress Notes (Signed)
A/ox3, pleased with MAC, report to RN 

## 2021-01-05 NOTE — Op Note (Signed)
[place] Patient Name: Amanda Estrada Procedure Date: 01/05/2021 10:09 AM MRN: [id number] Endoscopist: Mallie Mussel L. Loletha Carrow , MD Age: 60 Referring MD:  Date of Birth: 1961/06/05 Gender: Female Account #: [id number] Procedure:                Upper GI endoscopy Indications:              Esophageal dysphagia (with recent food impaction                            that resolved without endoscopy) Medicines:                Monitored Anesthesia Care Procedure:                Pre-Anesthesia Assessment:                           - Prior to the procedure, a History and Physical                            was performed, and patient medications and                            allergies were reviewed. The patient's tolerance of                            previous anesthesia was also reviewed. The risks                            and benefits of the procedure and the sedation                            options and risks were discussed with the patient.                            All questions were answered, and informed consent                            was obtained. Prior Anticoagulants: The patient has                            taken Eliquis (apixaban), last dose was 2 days                            prior to procedure. ASA Grade Assessment: II - A                            patient with mild systemic disease. After reviewing                            the risks and benefits, the patient was deemed in                            satisfactory condition to undergo the procedure.  After obtaining informed consent, the endoscope was                            passed under direct vision. Throughout the                            procedure, the patient's blood pressure, pulse, and                            oxygen saturations were monitored continuously. The                            Endoscope was introduced through the mouth, and                            advanced  to the second part of duodenum. The upper                            GI endoscopy was accomplished without difficulty.                            The patient tolerated the procedure well. Scope In: Scope Out: Findings:                 LA Grade C (one or more mucosal breaks continuous                            between tops of 2 or more mucosal folds, less than                            75% circumference) esophagitis with no bleeding was                            found in the distal esophagus.                           One benign-appearing, intrinsic moderate stenosis                            was found at the gastroesophageal junction. This                            stenosis measured 1.2 cm (inner diameter) x less                            than one cm (in length). The stenosis was                            traversed. A TTS dilator was passed through the                            scope. Dilation with a 16-17-18 mm balloon dilator  was performed to 17 mm. The dilation site was                            examined and showed moderate mucosal disruption and                            mild improvement in luminal narrowing.                           The distal esophagus was mildly tortuous.                           A 6 to 7 cm hiatal hernia was present.                           The exam of the stomach was otherwise normal.                           The examined duodenum was normal. Complications:            No immediate complications. Estimated Blood Loss:     Estimated blood loss was minimal. Impression:               - LA Grade C reflux esophagitis with no bleeding.                           - Benign-appearing esophageal stenosis. Dilated.                           - Tortuous esophagus.                           - 6 to 7 cm hiatal hernia.                           - Normal examined duodenum.                           - No specimens  collected. Recommendation:           - Patient has a contact number available for                            emergencies. The signs and symptoms of potential                            delayed complications were discussed with the                            patient. Return to normal activities tomorrow.                            Written discharge instructions were provided to the                            patient.                           -  Resume previous diet.                           - Resume Eliquis (apixaban) at prior dose tomorrow.                           - Use Prilosec (omeprazole) 40 mg PO BID for 8                            weeks. Disp# 60, RF 1                           - Return to my office at appointment to be                            scheduled. Iyona Pehrson L. Loletha Carrow, MD 01/05/2021 10:35:45 AM This report has been signed electronically.

## 2021-01-06 ENCOUNTER — Telehealth: Payer: Self-pay

## 2021-01-06 NOTE — Telephone Encounter (Signed)
Per 01/05/21 procedure note -  Return to my office at appointment to be scheduled.   Lm on mobile vm for patient to return call

## 2021-01-07 ENCOUNTER — Telehealth: Payer: Self-pay

## 2021-01-07 NOTE — Telephone Encounter (Signed)
  Follow up Call-  Call back number 01/05/2021 12/04/2018  Post procedure Call Back phone  # [phone]  Permission to leave phone message Yes Yes  Some recent data might be hidden     Patient questions:  Do you have a fever, pain , or abdominal swelling? No. Pain Score  0 *  Have you tolerated food without any problems? Yes.    Have you been able to return to your normal activities? Yes.    Do you have any questions about your discharge instructions: Diet   No. Medications  No. Follow up visit  No.  Do you have questions or concerns about your Care? No.  Actions: * If pain score is 4 or above: No action needed, pain <4. 1. Have you developed a fever since your procedure? no  2.   Have you had an respiratory symptoms (SOB or cough) since your procedure? no  3.   Have you tested positive for COVID 19 since your procedure no  4.   Have you had any family members/close contacts diagnosed with the COVID 19 since your procedure?  no   If yes to any of these questions please route to Joylene John, RN and Joella Prince, RN

## 2021-01-07 NOTE — Telephone Encounter (Signed)
Patient has been scheduled for a follow up on Tuesday, 02/17/21 at 8:40 AM. Letter mailed to patient and information sent through My Chart.

## 2021-01-07 NOTE — Telephone Encounter (Signed)
Left message on follow up call. 

## 2021-01-27 ENCOUNTER — Other Ambulatory Visit: Payer: Self-pay | Admitting: Physician Assistant

## 2021-01-27 ENCOUNTER — Other Ambulatory Visit: Payer: Self-pay | Admitting: Family Medicine

## 2021-01-27 ENCOUNTER — Other Ambulatory Visit: Payer: Self-pay | Admitting: Cardiology

## 2021-01-27 ENCOUNTER — Other Ambulatory Visit: Payer: Self-pay | Admitting: Gastroenterology

## 2021-01-28 NOTE — Telephone Encounter (Signed)
40f, 88kg, scr 0.68 12/24/20, lovw/duke 12/24/20

## 2021-02-17 ENCOUNTER — Other Ambulatory Visit: Payer: Self-pay

## 2021-02-17 ENCOUNTER — Encounter: Payer: Self-pay | Admitting: Gastroenterology

## 2021-02-17 ENCOUNTER — Ambulatory Visit: Payer: BC Managed Care – PPO | Admitting: Gastroenterology

## 2021-02-17 VITALS — BP 138/60 | HR 66 | Ht 66.0 in | Wt 200.0 lb

## 2021-02-17 DIAGNOSIS — K222 Esophageal obstruction: Secondary | ICD-10-CM

## 2021-02-17 DIAGNOSIS — K2100 Gastro-esophageal reflux disease with esophagitis, without bleeding: Secondary | ICD-10-CM | POA: Diagnosis not present

## 2021-02-17 DIAGNOSIS — K449 Diaphragmatic hernia without obstruction or gangrene: Secondary | ICD-10-CM

## 2021-02-17 DIAGNOSIS — R1319 Other dysphagia: Secondary | ICD-10-CM

## 2021-02-17 MED ORDER — OMEPRAZOLE 40 MG PO CPDR: 40.0000 mg | DELAYED_RELEASE_CAPSULE | Freq: Two times a day (BID) | ORAL | 5 refills | Status: DC

## 2021-02-17 NOTE — Progress Notes (Signed)
[facility] Progress Note  Chief Complaint: Esophageal dysphagia  Subjective  History: Amanda Estrada was seen in follow-up for dysphagia, and she underwent upper endoscopy on February 28.  January 27 office visit details history of intermittent food impactions and solid food dysphagia.  She was also having some hemoptysis, about which I messaged her primary care provider and pulmonologist.  (She had a removal of a benign endobronchial lesion in 2018).  She saw primary care in February 1 treated for possible allergic and asthmatic cause, does not appear to been seen by Dr. Melvyn Novas since then.  On recent EGD, she had a 6 to 7 cm hiatal hernia with mild distal esophageal tortuosity related to that, also grade C reflux esophagitis and an EG junction stricture about 12 mm in initial diameter.  Balloon dilation to 17 mm was performed with good effect.  She was then placed on omeprazole 40 mg twice daily. She tells me she had some relief of dysphagia afterwards, but is still having difficulty with solid food such as meat and nuts.  Amanda Estrada says she is "about 25% better" from the standpoint of dysphagia.  However her heartburn is completely resolved.  Appetite is good and weight stable. She has become more mindful of and cautious while eating.  ROS: Cardiovascular:  no chest pain Respiratory: no dyspnea  The patient's Past Medical, Family and Social History were reviewed and are on file in the EMR.  Objective:  Med list reviewed  Current Outpatient Medications:  .  albuterol (VENTOLIN HFA) 108 (90 Base) MCG/ACT inhaler, Inhale 2 puffs into the lungs every 4 (four) hours as needed for wheezing or shortness of breath., Disp: 8 g, Rfl: 2 .  beclomethasone (QVAR) 80 MCG/ACT inhaler, Inhale 1 puff into the lungs in the morning and at bedtime., Disp: 1 each, Rfl: 12 .  bisoprolol (ZEBETA) 5 MG tablet, TAKE 1 TABLET BY MOUTH EVERY DAY, Disp: 90 tablet, Rfl: 3 .  cloNIDine (CATAPRES) 0.1 MG tablet, TAKE  1 TABLET (0.1 MG TOTAL) BY MOUTH AT BEDTIME., Disp: 90 tablet, Rfl: 1 .  dicyclomine (BENTYL) 10 MG capsule, TAKE 1 CAPSULE (10 MG TOTAL) BY MOUTH 4 (FOUR) TIMES DAILY - BEFORE MEALS AND AT BEDTIME., Disp: 360 capsule, Rfl: 0 .  diltiazem (CARDIZEM CD) 240 MG 24 hr capsule, TAKE 1 CAPSULE BY MOUTH DAILY. PLEASE SCHEDULE APPT WITH DR. HOCHREIN FOR REFILLS. [phone]., Disp: 90 capsule, Rfl: 2 .  ELIQUIS 5 MG TABS tablet, TAKE 1 TABLET BY MOUTH TWICE A DAY, Disp: 60 tablet, Rfl: 11 .  fluticasone (FLONASE) 50 MCG/ACT nasal spray, PLACE 1 SPRAY INTO BOTH NOSTRILS DAILY AS NEEDED FOR ALLERGIES., Disp: 48 mL, Rfl: 3 .  hydrALAZINE (APRESOLINE) 25 MG tablet, TAKE 1 TABLET BY MOUTH TWICE A DAY, Disp: 180 tablet, Rfl: 3 .  levocetirizine (XYZAL) 5 MG tablet, Take 5 mg by mouth at bedtime. , Disp: , Rfl:  .  montelukast (SINGULAIR) 10 MG tablet, TAKE 1 TABLET (10 MG TOTAL) BY MOUTH AT BEDTIME., Disp: 90 tablet, Rfl: 3 .  rosuvastatin (CRESTOR) 20 MG tablet, TAKE 1 TABLET BY MOUTH EVERY DAY, Disp: 90 tablet, Rfl: 3 .  spironolactone (ALDACTONE) 50 MG tablet, TAKE 1 TABLET BY MOUTH EVERY DAY, Disp: 90 tablet, Rfl: 3 .  metoprolol tartrate (LOPRESSOR) 25 MG tablet, Take 1 tablet (25 mg total) by mouth 2 (two) times daily as needed (AS NEEDED FOR PALPITATIONS/ATRIAL FIBRILATION)., Disp: 180 tablet, Rfl: 3 .  omeprazole (PRILOSEC) 40 MG capsule,  Take 1 capsule (40 mg total) by mouth 2 (two) times daily., Disp: 30 capsule, Rfl: 5   Vital signs in last 24 hrs: Vitals:   02/17/21 0844  BP: 138/60  Pulse: 66  SpO2: 99%   Wt Readings from Last 3 Encounters:  02/17/21 200 lb (90.7 kg)  01/05/21 194 lb (88 kg)  12/24/20 194 lb (88 kg)    Physical Exam   No exam.  Entire visit spent in discussion.  Labs:   ___________________________________________ Radiologic studies:   ____________________________________________ Other:   _____________________________________________ Assessment & Plan   Assessment: Encounter Diagnoses  Name Primary?  . Gastroesophageal reflux disease with esophagitis without hemorrhage Yes  . Esophageal dysphagia   . Hiatal hernia   . Esophageal stricture    I reviewed her symptoms, endoscopic findings and described everything along with a diagram of the anatomy.  Her dysphagia is a combination of the hiatal hernia causing some distal esophageal tortuosity as well as the reflux induced stricture.  I think the hiatal hernia is significantly contributing to all this. We discussed the limitation of acid suppression therapy, breadth of diet and lifestyle changes required for reflux control, the possibility of reflux related complications such as esophagitis, stricture or Barrett's esophagus.    While she certainly needs to exercise caution with eating, especially meat, in order to avoid future food impactions, I also recommended she consider surgical evaluation to repair the hiatal hernia and perform fundoplication.  I gave her a brief description of that surgery for further consideration.  She had some apprehension because her brother apparently had some type of hernia repair and then problems afterwards.   Plan: After 2 more weeks of twice daily omeprazole, she would then decrease to 40 mg once a day.  I recommended she not stop this medicine, otherwise she will most likely develop worsening esophagitis with stricture and and more dysphagia.  Ongoing diet and lifestyle measures needed to reduce reflux.  She will give further consideration to the surgical consultation and let us know if she would like to pursue that.  I will otherwise see her in a year or sooner as needed.  20 minutes were spent on this encounter (including chart review, history/exam, counseling/coordination of care, and documentation) > 50% of that time was spent on counseling and coordination of care.  Topics discussed included: See above.  Amanda Estrada

## 2021-02-17 NOTE — Patient Instructions (Signed)
If you are age 61 or older, your body mass index should be between 23-30. Your Body mass index is 32.28 kg/m. If this is out of the aforementioned range listed, please consider follow up with your Primary Care Provider.  If you are age 52 or younger, your body mass index should be between 19-25. Your Body mass index is 32.28 kg/m. If this is out of the aformentioned range listed, please consider follow up with your Primary Care Provider.   It was a pleasure to see you today!  Dr. Loletha Carrow

## 2021-03-21 DIAGNOSIS — I480 Paroxysmal atrial fibrillation: Secondary | ICD-10-CM | POA: Insufficient documentation

## 2021-03-21 DIAGNOSIS — E785 Hyperlipidemia, unspecified: Secondary | ICD-10-CM | POA: Insufficient documentation

## 2021-03-21 NOTE — Progress Notes (Deleted)
Cardiology Office Note   Date:  03/21/2021   ID:  Amanda Estrada, DOB [birth year], MRN [id number]  PCP:  Amanda Sanders, MD  Cardiologist:   Amanda Breeding, MD Referring:  Amanda Sanders, MD  No chief complaint on file.     History of Present Illness: Amanda Estrada is a 60 y.o. female who presents for follow up of atrial fib.  She has previously seen Dr. Rockey Estrada for evaluation of this.  She has had reservations about taking anticoagulation secondary to cough and blood tinged sputum.  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  This was apparently benign.   She restarted Eliquis.  She had coronary calcium on CT.   ***   ***   I saw her last year.  She was going to have a POET (Plain Old Exercise Treadmill) but she broke her leg.   She had a cough without evidence of endobronchial lesions per Amanda Estrada.     Since I last saw her she restarted Eliquis.  She has had low sodium with the last reading being 129.  It has been suggested that she stop her Spironolactone to see if this could be contributing but she did not want to stop this.  She is actually doing relatively well though she is been limited because of the leg and having had surgery.  She did physical therapy. The patient denies any new symptoms such as chest discomfort, neck or arm discomfort. There has been no new shortness of breath, PND or orthopnea. There have been no reported palpitations, presyncope or syncope.   Past Medical History:  Diagnosis Date  . Allergy   . Anxiety    patient denies  . Asthma   . Cataract   . GERD (gastroesophageal reflux disease)   . Glaucoma   . History of echocardiogram    a. 11/2015: EF 55-60%, no RWMA, nl LV diastolic fxn, PASP nl  . Hypertension   . Obesity   . PAF (paroxysmal atrial fibrillation) ([place])    a. CHADS2VASc = 2 (HTN, sex category)  . Sinusitis 2011   Recurrent per Amanda Estrada with [facility]    Past Surgical  History:  Procedure Laterality Date  . BUNIONECTOMY  11/2007   Right foot, 2 hammer-toes and tendon replacement (Amanda Estrada)  . cataract Bilateral 06/2018   narrow angle glaucoma bilateral  . CERVICAL DISCECTOMY  2001   C5/6  . CESAREAN SECTION  1985   Due to incr BP  . COLONOSCOPY    . ENDOMETRIAL BIOPSY  03/27/2008   B9 endometrium w/breakdown changes (Dr, Amanda Estrada)  . LASER BRONCHOSCOPY N/A 09/19/2017   Procedure: LASER BRONCHOSCOPY;  Surgeon: Amanda Nakayama, MD;  Location: [facility] OR;  Service: Thoracic;  Laterality: N/A;  . Lasix eye surgery    . LUNG SURGERY Left 09/19/2017   benign tumor removed  . NSVD  1989   VBAC  . Renal artery [place]  02/2007   [place] arteries but no stenosis  . TIBIA FRACTURE SURGERY Left 12/2017  . US TRANSVAGINAL PELVIC MODIFIED  04/09/2008   Normal  . VIDEO BRONCHOSCOPY Bilateral 08/10/2017   Procedure: VIDEO BRONCHOSCOPY WITHOUT FLUORO;  Surgeon: Amanda Rockers, MD;  Location: [facility];  Service: Cardiopulmonary;  Laterality: Bilateral;  . VIDEO BRONCHOSCOPY N/A 09/19/2017   Procedure: VIDEO BRONCHOSCOPY, with endobronchial tumor resection;  Surgeon: Amanda Nakayama, MD;  Location: [facility];  Service: Thoracic;  Laterality: N/A;  . VIDEO BRONCHOSCOPY Bilateral 08/16/2018   Procedure: VIDEO BRONCHOSCOPY WITHOUT FLUORO;  Surgeon: Amanda Rockers, MD;  Location: [facility];  Service: Cardiopulmonary;  Laterality: Bilateral;     Current Outpatient Medications  Medication Sig Dispense Refill  . albuterol (VENTOLIN HFA) 108 (90 Base) MCG/ACT inhaler Inhale 2 puffs into the lungs every 4 (four) hours as needed for wheezing or shortness of breath. 8 g 2  . beclomethasone (QVAR) 80 MCG/ACT inhaler Inhale 1 puff into the lungs in the morning and at bedtime. 1 each 12  . bisoprolol (ZEBETA) 5 MG tablet TAKE 1 TABLET BY MOUTH EVERY DAY 90 tablet 3  . cloNIDine (CATAPRES) 0.1 MG tablet TAKE 1 TABLET (0.1 MG TOTAL) BY MOUTH AT BEDTIME. 90 tablet 1   . dicyclomine (BENTYL) 10 MG capsule TAKE 1 CAPSULE (10 MG TOTAL) BY MOUTH 4 (FOUR) TIMES DAILY - BEFORE MEALS AND AT BEDTIME. 360 capsule 0  . diltiazem (CARDIZEM CD) 240 MG 24 hr capsule TAKE 1 CAPSULE BY MOUTH DAILY. PLEASE SCHEDULE APPT WITH DR. Napoleon Estrada FOR REFILLS. [phone]. 90 capsule 2  . ELIQUIS 5 MG TABS tablet TAKE 1 TABLET BY MOUTH TWICE A DAY 60 tablet 11  . fluticasone (FLONASE) 50 MCG/ACT nasal spray PLACE 1 SPRAY INTO BOTH NOSTRILS DAILY AS NEEDED FOR ALLERGIES. 48 mL 3  . hydrALAZINE (APRESOLINE) 25 MG tablet TAKE 1 TABLET BY MOUTH TWICE A DAY 180 tablet 3  . levocetirizine (XYZAL) 5 MG tablet Take 5 mg by mouth at bedtime.     . metoprolol tartrate (LOPRESSOR) 25 MG tablet Take 1 tablet (25 mg total) by mouth 2 (two) times daily as needed (AS NEEDED FOR PALPITATIONS/ATRIAL FIBRILATION). 180 tablet 3  . montelukast (SINGULAIR) 10 MG tablet TAKE 1 TABLET (10 MG TOTAL) BY MOUTH AT BEDTIME. 90 tablet 3  . omeprazole (PRILOSEC) 40 MG capsule Take 1 capsule (40 mg total) by mouth 2 (two) times daily. 30 capsule 5  . rosuvastatin (CRESTOR) 20 MG tablet TAKE 1 TABLET BY MOUTH EVERY DAY 90 tablet 3  . spironolactone (ALDACTONE) 50 MG tablet TAKE 1 TABLET BY MOUTH EVERY DAY 90 tablet 3   No current facility-administered medications for this visit.    Allergies:   Amoxicillin-pot clavulanate    ROS:  Please see the history of present illness.   Otherwise, review of systems are positive for ***.   All other systems are reviewed and negative.    PHYSICAL EXAM: VS:  There were no vitals taken for this visit. , BMI There is no height or weight on file to calculate BMI. GENERAL:  Well appearing NECK:  No jugular venous distention, waveform within normal limits, carotid upstroke brisk and symmetric, no bruits, no thyromegaly LUNGS:  Clear to auscultation bilaterally CHEST:  Unremarkable HEART:  PMI not displaced or sustained,S1 and S2 within normal limits, no S3, no S4, no clicks,  no rubs, *** murmurs ABD:  Flat, positive bowel sounds normal in frequency in pitch, no bruits, no rebound, no guarding, no midline pulsatile mass, no hepatomegaly, no splenomegaly EXT:  2 plus pulses throughout, no edema, no cyanosis no clubbing    ***  GENERAL:  Well appearing NECK:  No jugular venous distention, waveform within normal limits, carotid upstroke brisk and symmetric, no bruits, no thyromegaly LUNGS:  Clear to auscultation bilaterally CHEST:  Unremarkable HEART:  PMI not displaced or sustained,S1 and S2 within normal limits, no S3, no S4, no clicks, no rubs, no  murmurs ABD:  Flat, positive bowel sounds normal in frequency in pitch, no bruits, no rebound, no guarding, no midline pulsatile mass, no hepatomegaly, no splenomegaly EXT:  2 plus pulses throughout, no edema, no cyanosis no clubbing   EKG:  EKG is ***  ordered today. The ekg ordered today demonstrates sinus rhythm, rate *** axis within normal limits, intervals within normal limits, no acute ST-T wave changes.   Recent Labs: 11/19/2020: Hemoglobin 12.9; Platelets 253 12/24/2020: BUN 8; Creatinine, Ser 0.68; Potassium 5.1; Sodium 133    Lipid Panel    Component Value Date/Time   CHOL 176 02/18/2020 1114   TRIG 96.0 02/18/2020 1114   HDL 94.50 02/18/2020 1114   CHOLHDL 2 02/18/2020 1114   VLDL 19.2 02/18/2020 1114   LDLCALC 62 02/18/2020 1114   LDLDIRECT 81.0 10/16/2010 0939      Wt Readings from Last 3 Encounters:  02/17/21 200 lb (90.7 kg)  01/05/21 194 lb (88 kg)  12/24/20 194 lb (88 kg)      Other studies Reviewed: Additional studies/ records that were reviewed today include:   *** Review of the above records demonstrates:  ***  ASSESSMENT AND PLAN:   Paroxysmal atrial fibrillation ([place]) -   Ms. Renaldo Harrison has a CHA2DS2 - VASc score of 2.   *** She has not had any symptomatic paroxysms.  However, when she did have this in the past it was persistent difficult to control.  She does have  some elevated heart rates on her Apple Watch.  Is not clear whether this could be fibrillation.  They are not symptomatic.  However, I agree that she should be on a blood thinner.  I would continue this.   Hypertension Her blood pressure actually is *** still not at target.  She does not tolerate clonidine more than at night.  She was taken off of the ARB with her lung issues.  I will give hydralazine 25 mg twice daily to supplement to once daily clonidine which she takes in the evening.  She will keep tabs on her blood pressure.   Hyperlipidemia LDL was *** at target of 51.  No change in meds.  Hyponatremia Na was ***  Her sodium is chronically low.  However, I would not want to discontinue the Spironolactone and I think her blood pressure would be much too high.  Therefore, this can be followed conservatively.  She is not having any symptoms.  Coronary calcium ***  At some point I would like to try to do a POET (Plain Old Exercise Treadmill).  However, with her continued leg pain she would not be able to do this at this point and so no change in therapy.   Current medicines are reviewed at length with the patient today.  The patient does not have concerns regarding medicines.  The following changes have been made: ***  Labs/ tests ordered today include:   ***  No orders of the defined types were placed in this encounter.    Disposition:   FU with me in *** months.  Signed, Amanda Breeding, MD  03/21/2021 10:07 AM    [facility]

## 2021-03-23 ENCOUNTER — Other Ambulatory Visit: Payer: Self-pay

## 2021-03-23 ENCOUNTER — Encounter: Payer: Self-pay | Admitting: Cardiology

## 2021-03-23 ENCOUNTER — Ambulatory Visit: Payer: BC Managed Care – PPO | Admitting: Cardiology

## 2021-03-23 ENCOUNTER — Telehealth: Payer: Self-pay | Admitting: *Deleted

## 2021-03-23 VITALS — BP 126/74 | HR 61 | Ht 65.0 in | Wt 197.0 lb

## 2021-03-23 DIAGNOSIS — I10 Essential (primary) hypertension: Secondary | ICD-10-CM

## 2021-03-23 DIAGNOSIS — R931 Abnormal findings on diagnostic imaging of heart and coronary circulation: Secondary | ICD-10-CM

## 2021-03-23 DIAGNOSIS — I480 Paroxysmal atrial fibrillation: Secondary | ICD-10-CM

## 2021-03-23 DIAGNOSIS — E785 Hyperlipidemia, unspecified: Secondary | ICD-10-CM

## 2021-03-23 DIAGNOSIS — Z5181 Encounter for therapeutic drug level monitoring: Secondary | ICD-10-CM

## 2021-03-23 NOTE — Telephone Encounter (Signed)
Patient was seen in office by Dr Percival Spanish today, he had ordered GXT After speaking with patient she had knee surgery several years ago after breaking her leg and does not feel she can walk on treadmill Discussed with Dr Percival Spanish and ok to order [place] patient and message sent to scheduling to arrange

## 2021-03-23 NOTE — Patient Instructions (Addendum)
Medication Instructions:  Your physician recommends that you continue on your current medications as directed. Please refer to the Current Medication list given to you today.  *If you need a refill on your cardiac medications before your next appointment, please call your pharmacy*   Lab Work: LIPIDS/CMET/CBC SOON    If you have labs (blood work) drawn today and your tests are completely normal, you will receive your results only by: Marland Kitchen MyChart Message (if you have MyChart) OR . A paper copy in the mail If you have any lab test that is abnormal or we need to change your treatment, we will call you to review the results.   Testing/Procedures: Your physician has requested that you have an exercise tolerance test. For further information please visit [email or web address]. Please also follow instruction sheet, as given.  HOLD BISOPROLOL MORNING OF TEST   Follow-Up: At [facility], you and your health needs are our priority.  As part of our continuing mission to provide you with exceptional heart care, we have created designated Provider Care Teams.  These Care Teams include your primary Cardiologist (physician) and Advanced Practice Providers (APPs -  Physician Assistants and Nurse Practitioners) who all work together to provide you with the care you need, when you need it.  We recommend signing up for the patient portal called "MyChart".  Sign up information is provided on this After Visit Summary.  MyChart is used to connect with patients for Virtual Visits (Telemedicine).  Patients are able to view lab/test results, encounter notes, upcoming appointments, etc.  Non-urgent messages can be sent to your provider as well.   To learn more about what you can do with MyChart, go to [email or web address].    Your next appointment:   12 month(s)  The format for your next appointment:   In Person  Provider:   You may see Minus Breeding, MD or one of the following Advanced Practice Providers  on your designated Care Team:    Rosaria Ferries, PA-C  Jory Sims, DNP, ANP  Other Instructions Exercise Stress Test An exercise stress test is a test to check how your heart works during exercise. You will need to walk on a treadmill or ride an exercise bike for this test. An electrocardiogram (ECG) will record your heartbeat when you are at rest and when you are exercising. You may have an ultrasound or nuclear test after the exercise test. The test is done to check for coronary artery disease (CAD). It is also done to:  See how well you can exercise.  Watch for high blood pressure during exercise.  Test how well you can exercise after treatment.  Check the blood flow to your arms and legs. If your test result is not normal, more testing may be needed. What happens before the procedure?  Follow instructions from your doctor about what you cannot eat or drink. ? Do not have any drinks or foods that have caffeine in them for 24 hours before the test, or as told by your doctor. This includes coffee, tea (even decaf tea), sodas, chocolate, and cocoa.  Ask your doctor about changing or stopping your normal medicines. This is important if you: ? Take diabetes medicines. ? Take beta-blocker medicines. ? Wear a nitroglycerin patch.  If you use an inhaler, bring it with you to the test.  Do not put lotions, powders, creams, or oils on your chest before the test.  Wear comfortable shoes and clothing.  Do not use any products that have  nicotine or tobacco in them, such as cigarettes and e-cigarettes. Stop using them at least 4 hours before the test. If you need help quitting, ask your doctor. What happens during the procedure?  Patches (electrodes) will be put on your chest.  Wires will be connected to the patches. The wires will send signals to a machine to record your heartbeat.  Your heart rate will be watched while you are resting and while you are exercising. Your blood  pressure will also be watched during the test.  You will walk on a treadmill or use an exercise bike. If you cannot use these, you may be asked to turn a crank with your hands.  The activity will get harder and will raise your heart rate.  You may be asked to breathe into a tube a few times during the test. This measures the gases that you breathe out.  You will be asked how you are feeling throughout the test.  You will exercise until your heart reaches a target heart rate. You will stop early if: ? You feel dizzy. ? You have chest pain. ? You are out of breath. ? Your blood pressure is too high or too low. ? You have an irregular heartbeat. ? You have pain or aching in your arms or legs. The procedure may vary among doctors and hospitals.   What happens after the procedure?  Your blood pressure, heart rate, breathing rate, and blood oxygen level will be watched after the test.  You may return to your normal diet and activities as told by your doctor.  It is up to you to get the results of your test. Ask your doctor, or the department that is doing the test, when your results will be ready. Summary  An exercise stress test is a test to check how your heart works during exercise.  This test is done to check for coronary artery disease.  Your heart rate will be watched while you are resting and while you are exercising.  Follow instructions from your doctor about what you cannot eat or drink before the test. This information is not intended to replace advice given to you by your health care provider. Make sure you discuss any questions you have with your health care provider. Document Revised: 06/27/2020 Document Reviewed: 06/27/2020 Elsevier Patient Education  [place].

## 2021-03-23 NOTE — Progress Notes (Signed)
Cardiology Office Note   Date:  03/23/2021   ID:  Amanda Estrada, DOB 12-24-1960, MRN [id number]  PCP:  Jinny Sanders, MD  Cardiologist:   Minus Breeding, MD Referring:  Jinny Sanders, MD  Chief Complaint  Patient presents with  . Elevated coronary calcium      History of Present Illness: Amanda Estrada is a 60 y.o. female who presents for follow up of atrial fib.  She has previously seen Dr. Rockey Situ for evaluation of this.  She has had reservations about taking anticoagulation secondary to cough and blood tinged sputum.  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  This was apparently benign.   She restarted Eliquis.  She had coronary calcium on CT.     Since I last saw her she has done well.  Her endobronchial lesions are benign and she is not sure she needs follow-up. The patient denies any new symptoms such as chest discomfort, neck or arm discomfort. There has been no new shortness of breath, PND or orthopnea. There have been no reported palpitations, presyncope or syncope.  She does not exercise routinely but she is on her feet all the time and her job as a Horticulturist, commercial.   Past Medical History:  Diagnosis Date  . Allergy   . Anxiety    patient denies  . Asthma   . Cataract   . GERD (gastroesophageal reflux disease)   . Glaucoma   . History of echocardiogram    a. 11/2015: EF 55-60%, no RWMA, nl LV diastolic fxn, PASP nl  . Hypertension   . Obesity   . PAF (paroxysmal atrial fibrillation) ([place])    a. CHADS2VASc = 2 (HTN, sex category)  . Sinusitis 2011   Recurrent per Dr. Tami Ribas with [facility]    Past Surgical History:  Procedure Laterality Date  . BUNIONECTOMY  11/2007   Right foot, 2 hammer-toes and tendon replacement (Dr. Paulla Dolly)  . cataract Bilateral 06/2018   narrow angle glaucoma bilateral  . CERVICAL DISCECTOMY  2001   C5/6  . CESAREAN SECTION  1985   Due to incr BP  . COLONOSCOPY     . ENDOMETRIAL BIOPSY  03/27/2008   B9 endometrium w/breakdown changes (Dr, Kinscius)  . LASER BRONCHOSCOPY N/A 09/19/2017   Procedure: LASER BRONCHOSCOPY;  Surgeon: Melrose Nakayama, MD;  Location: [facility] OR;  Service: Thoracic;  Laterality: N/A;  . Lasix eye surgery    . LUNG SURGERY Left 09/19/2017   benign tumor removed  . NSVD  1989   VBAC  . Renal artery [place]  02/2007   [place] arteries but no stenosis  . TIBIA FRACTURE SURGERY Left 12/2017  . US TRANSVAGINAL PELVIC MODIFIED  04/09/2008   Normal  . VIDEO BRONCHOSCOPY Bilateral 08/10/2017   Procedure: VIDEO BRONCHOSCOPY WITHOUT FLUORO;  Surgeon: Tanda Rockers, MD;  Location: [facility];  Service: Cardiopulmonary;  Laterality: Bilateral;  . VIDEO BRONCHOSCOPY N/A 09/19/2017   Procedure: VIDEO BRONCHOSCOPY, with endobronchial tumor resection;  Surgeon: Melrose Nakayama, MD;  Location: [facility];  Service: Thoracic;  Laterality: N/A;  . VIDEO BRONCHOSCOPY Bilateral 08/16/2018   Procedure: VIDEO BRONCHOSCOPY WITHOUT FLUORO;  Surgeon: Tanda Rockers, MD;  Location: [facility];  Service: Cardiopulmonary;  Laterality: Bilateral;     Current Outpatient Medications  Medication Sig Dispense Refill  . albuterol (VENTOLIN HFA) 108 (90 Base) MCG/ACT inhaler Inhale 2 puffs into the  lungs every 4 (four) hours as needed for wheezing or shortness of breath. 8 g 2  . beclomethasone (QVAR) 80 MCG/ACT inhaler Inhale 1 puff into the lungs in the morning and at bedtime. 1 each 12  . bisoprolol (ZEBETA) 5 MG tablet TAKE 1 TABLET BY MOUTH EVERY DAY 90 tablet 3  . cloNIDine (CATAPRES) 0.1 MG tablet TAKE 1 TABLET (0.1 MG TOTAL) BY MOUTH AT BEDTIME. 90 tablet 1  . dicyclomine (BENTYL) 10 MG capsule TAKE 1 CAPSULE (10 MG TOTAL) BY MOUTH 4 (FOUR) TIMES DAILY - BEFORE MEALS AND AT BEDTIME. 360 capsule 0  . diltiazem (CARDIZEM CD) 240 MG 24 hr capsule TAKE 1 CAPSULE BY MOUTH DAILY. PLEASE SCHEDULE APPT WITH DR. Cerys Winget FOR REFILLS. [phone]. 90  capsule 2  . ELIQUIS 5 MG TABS tablet TAKE 1 TABLET BY MOUTH TWICE A DAY 60 tablet 11  . fluticasone (FLONASE) 50 MCG/ACT nasal spray PLACE 1 SPRAY INTO BOTH NOSTRILS DAILY AS NEEDED FOR ALLERGIES. 48 mL 3  . hydrALAZINE (APRESOLINE) 25 MG tablet TAKE 1 TABLET BY MOUTH TWICE A DAY 180 tablet 3  . levocetirizine (XYZAL) 5 MG tablet Take 5 mg by mouth at bedtime.     . montelukast (SINGULAIR) 10 MG tablet TAKE 1 TABLET (10 MG TOTAL) BY MOUTH AT BEDTIME. 90 tablet 3  . omeprazole (PRILOSEC) 40 MG capsule Take 1 capsule (40 mg total) by mouth 2 (two) times daily. 30 capsule 5  . rosuvastatin (CRESTOR) 20 MG tablet TAKE 1 TABLET BY MOUTH EVERY DAY 90 tablet 3  . spironolactone (ALDACTONE) 50 MG tablet TAKE 1 TABLET BY MOUTH EVERY DAY 90 tablet 3  . metoprolol tartrate (LOPRESSOR) 25 MG tablet Take 1 tablet (25 mg total) by mouth 2 (two) times daily as needed (AS NEEDED FOR PALPITATIONS/ATRIAL FIBRILATION). 180 tablet 3   No current facility-administered medications for this visit.    Allergies:   Amoxicillin-pot clavulanate    ROS:  Please see the history of present illness.   Otherwise, review of systems are positive for none.   All other systems are reviewed and negative.    PHYSICAL EXAM: VS:  BP 126/74   Pulse 61   Ht 5\' 5"  (1.651 m)   Wt 197 lb (89.4 kg)   SpO2 99%   BMI 32.78 kg/m  , BMI Body mass index is 32.78 kg/m. GENERAL:  Well appearing NECK:  No jugular venous distention, waveform within normal limits, carotid upstroke brisk and symmetric, no bruits, no thyromegaly LUNGS:  Clear to auscultation bilaterally CHEST:  Unremarkable HEART:  PMI not displaced or sustained,S1 and S2 within normal limits, no S3, no S4, no clicks, no rubs, no murmurs ABD:  Flat, positive bowel sounds normal in frequency in pitch, no bruits, no rebound, no guarding, no midline pulsatile mass, no hepatomegaly, no splenomegaly EXT:  2 plus pulses throughout, no edema, no cyanosis no clubbing   EKG:   EKG is   ordered today. The ekg ordered today demonstrates sinus rhythm, rate 61 axis within normal limits, intervals within normal limits, no acute ST-T wave changes.   Recent Labs: 11/19/2020: Hemoglobin 12.9; Platelets 253 12/24/2020: BUN 8; Creatinine, Ser 0.68; Potassium 5.1; Sodium 133    Lipid Panel    Component Value Date/Time   CHOL 176 02/18/2020 1114   TRIG 96.0 02/18/2020 1114   HDL 94.50 02/18/2020 1114   CHOLHDL 2 02/18/2020 1114   VLDL 19.2 02/18/2020 1114   LDLCALC 62 02/18/2020 1114   LDLDIRECT 81.0  10/16/2010 0939      Wt Readings from Last 3 Encounters:  03/23/21 197 lb (89.4 kg)  02/17/21 200 lb (90.7 kg)  01/05/21 194 lb (88 kg)      Other studies Reviewed: Additional studies/ records that were reviewed today include:   Labs, CT calcium score Review of the above records demonstrates: See elsewhere  ASSESSMENT AND PLAN:   Paroxysmal atrial fibrillation ([place]) -   Amanda Estrada has a CHA2DS2 - VASc score of 2.   She had no further paroxysms.  She has had no episodes on her wearable.  He tolerates anticoagulation.  No change in therapy.   Hypertension Her blood pressure actually is at target.  No change in therapy.   Hyperlipidemia LDL was 62 but this was last year.   Hyponatremia Na was slightly low but higher than previous last time was checked and I will repeat this.  Coronary calcium Because of strong family history.  We talked about primary risk reduction.  I would also like to bring her back for a POET (Plain Old Exercise Treadmill)    Current medicines are reviewed at length with the patient today.  The patient does not have concerns regarding medicines.  The following changes have been made: None  Labs/ tests ordered today include:   None  Orders Placed This Encounter  Procedures  . CBC with Differential/Platelet  . Comprehensive metabolic panel  . Lipid panel  . Cardiac Stress Test: Informed Consent Details:  Physician/Practitioner Attestation; Transcribe to consent form and obtain patient signature  . Exercise Tolerance Test  . EKG 12-Lead     Disposition:   FU with me in 12 months.  Signed, Minus Breeding, MD  03/23/2021 10:08 AM    [facility]

## 2021-03-25 ENCOUNTER — Telehealth: Payer: Self-pay | Admitting: *Deleted

## 2021-03-25 DIAGNOSIS — R931 Abnormal findings on diagnostic imaging of heart and coronary circulation: Secondary | ICD-10-CM

## 2021-03-25 DIAGNOSIS — I480 Paroxysmal atrial fibrillation: Secondary | ICD-10-CM

## 2021-03-25 DIAGNOSIS — I10 Essential (primary) hypertension: Secondary | ICD-10-CM

## 2021-03-25 NOTE — Telephone Encounter (Signed)
Advised patient and sent to scheduling to arrange

## 2021-03-25 NOTE — Telephone Encounter (Signed)
-----   Message from Minus Breeding, MD sent at 03/25/2021  1:11 PM EDT ----- Yes. POET (Plain Old Exercise Treadmill) please ----- Message ----- From: Imagene Gurney Sent: 03/24/2021   8:34 AM EDT To: Minus Breeding, MD, Earvin Hansen, LPN  Good morning,  Patient is requesting to have the treadmill stress test, can we place order for that? ----- Message ----- From: Earvin Hansen, LPN Sent: 3/72/9021   6:39 PM EDT To: Cv Div Nl Pcc, Cv Div Nl Scheduling  HELLO ALL Patient needs lexiscan myoview Thanks [place]

## 2021-04-21 DIAGNOSIS — S86912A Strain of unspecified muscle(s) and tendon(s) at lower leg level, left leg, initial encounter: Secondary | ICD-10-CM | POA: Diagnosis not present

## 2021-04-21 DIAGNOSIS — M1712 Unilateral primary osteoarthritis, left knee: Secondary | ICD-10-CM | POA: Diagnosis not present

## 2021-05-01 ENCOUNTER — Other Ambulatory Visit: Payer: Self-pay | Admitting: *Deleted

## 2021-05-01 DIAGNOSIS — R601 Generalized edema: Secondary | ICD-10-CM

## 2021-05-01 MED ORDER — FUROSEMIDE 20 MG PO TABS: 20.0000 mg | ORAL_TABLET | Freq: Every day | ORAL | 0 refills | Status: DC | PRN

## 2021-05-01 NOTE — Telephone Encounter (Signed)
Minus Breeding, MD  You Just now (3:06 PM) OK for Lasix 20 mg daily prn disp number 30 with no refills.  Called Pt, notified of new Sig for lasix. She will take not of weight and when she is taking. New rx sent to requested pharmacy per pt. She will call if she has any side effects.

## 2021-05-12 ENCOUNTER — Other Ambulatory Visit: Payer: Self-pay | Admitting: Gastroenterology

## 2021-05-12 ENCOUNTER — Other Ambulatory Visit: Payer: Self-pay | Admitting: Cardiology

## 2021-05-13 ENCOUNTER — Other Ambulatory Visit: Payer: Self-pay | Admitting: Cardiology

## 2021-05-13 DIAGNOSIS — Z1231 Encounter for screening mammogram for malignant neoplasm of breast: Secondary | ICD-10-CM | POA: Diagnosis not present

## 2021-05-25 ENCOUNTER — Encounter: Payer: Self-pay | Admitting: Family Medicine

## 2021-05-25 ENCOUNTER — Other Ambulatory Visit: Payer: Self-pay | Admitting: Cardiology

## 2021-05-25 DIAGNOSIS — R928 Other abnormal and inconclusive findings on diagnostic imaging of breast: Secondary | ICD-10-CM | POA: Diagnosis not present

## 2021-05-25 DIAGNOSIS — R922 Inconclusive mammogram: Secondary | ICD-10-CM | POA: Diagnosis not present

## 2021-05-25 DIAGNOSIS — R921 Mammographic calcification found on diagnostic imaging of breast: Secondary | ICD-10-CM | POA: Diagnosis not present

## 2021-05-25 LAB — HM MAMMOGRAPHY

## 2021-05-26 ENCOUNTER — Telehealth: Payer: Self-pay | Admitting: Family Medicine

## 2021-05-26 DIAGNOSIS — M1712 Unilateral primary osteoarthritis, left knee: Secondary | ICD-10-CM | POA: Diagnosis not present

## 2021-05-26 DIAGNOSIS — S82132D Displaced fracture of medial condyle of left tibia, subsequent encounter for closed fracture with routine healing: Secondary | ICD-10-CM | POA: Diagnosis not present

## 2021-05-26 DIAGNOSIS — S82142D Displaced bicondylar fracture of left tibia, subsequent encounter for closed fracture with routine healing: Secondary | ICD-10-CM | POA: Diagnosis not present

## 2021-05-26 DIAGNOSIS — I82409 Acute embolism and thrombosis of unspecified deep veins of unspecified lower extremity: Secondary | ICD-10-CM | POA: Diagnosis not present

## 2021-05-26 DIAGNOSIS — R600 Localized edema: Secondary | ICD-10-CM | POA: Diagnosis not present

## 2021-05-26 DIAGNOSIS — Z79899 Other long term (current) drug therapy: Secondary | ICD-10-CM | POA: Diagnosis not present

## 2021-05-26 DIAGNOSIS — M79662 Pain in left lower leg: Secondary | ICD-10-CM | POA: Diagnosis not present

## 2021-05-26 DIAGNOSIS — Z7901 Long term (current) use of anticoagulants: Secondary | ICD-10-CM | POA: Diagnosis not present

## 2021-05-26 DIAGNOSIS — M19072 Primary osteoarthritis, left ankle and foot: Secondary | ICD-10-CM | POA: Diagnosis not present

## 2021-05-26 DIAGNOSIS — E785 Hyperlipidemia, unspecified: Secondary | ICD-10-CM

## 2021-05-26 DIAGNOSIS — X58XXXD Exposure to other specified factors, subsequent encounter: Secondary | ICD-10-CM | POA: Diagnosis not present

## 2021-05-26 DIAGNOSIS — I4891 Unspecified atrial fibrillation: Secondary | ICD-10-CM | POA: Diagnosis not present

## 2021-05-26 DIAGNOSIS — M25572 Pain in left ankle and joints of left foot: Secondary | ICD-10-CM | POA: Diagnosis not present

## 2021-05-26 DIAGNOSIS — M7989 Other specified soft tissue disorders: Secondary | ICD-10-CM | POA: Diagnosis not present

## 2021-05-26 NOTE — Telephone Encounter (Signed)
-----   Message from Cloyd Stagers, RT sent at 05/14/2021  4:55 PM EDT ----- Regarding: Lab Orders for Wednesday 7.20.2022 Please place lab orders for Wednesday 7.20.2022, office visit for physical on Friday 8.19.2022 Thank you, Dyke Maes RT(R)

## 2021-05-27 ENCOUNTER — Other Ambulatory Visit (INDEPENDENT_AMBULATORY_CARE_PROVIDER_SITE_OTHER): Payer: BC Managed Care – PPO

## 2021-05-27 ENCOUNTER — Other Ambulatory Visit: Payer: Self-pay

## 2021-05-27 DIAGNOSIS — E785 Hyperlipidemia, unspecified: Secondary | ICD-10-CM

## 2021-05-27 LAB — COMPREHENSIVE METABOLIC PANEL
ALT: 26 U/L (ref 0–35)
AST: 21 U/L (ref 0–37)
Albumin: 4.5 g/dL (ref 3.5–5.2)
Alkaline Phosphatase: 73 U/L (ref 39–117)
BUN: 11 mg/dL (ref 6–23)
CO2: 24 mEq/L (ref 19–32)
Calcium: 9.9 mg/dL (ref 8.4–10.5)
Chloride: 97 mEq/L (ref 96–112)
Creatinine, Ser: 0.74 mg/dL (ref 0.40–1.20)
GFR: 87.77 mL/min (ref >60.00)
Glucose, Bld: 97 mg/dL (ref 70–99)
Potassium: 4.8 mEq/L (ref 3.5–5.1)
Sodium: 130 mEq/L — ABNORMAL LOW (ref 135–145)
Total Bilirubin: 0.5 mg/dL (ref 0.2–1.2)
Total Protein: 7.3 g/dL (ref 6.0–8.3)

## 2021-05-27 LAB — LIPID PANEL
Cholesterol: 160 mg/dL (ref 0–200)
HDL: 76.6 mg/dL (ref >39.00)
LDL Cholesterol: 60 mg/dL (ref 0–99)
NonHDL: 83.26
Total CHOL/HDL Ratio: 2
Triglycerides: 117 mg/dL (ref 0.0–149.0)
VLDL: 23.4 mg/dL (ref 0.0–40.0)

## 2021-05-27 NOTE — Progress Notes (Signed)
No critical labs need to be addressed urgently. We will discuss labs in detail at upcoming office visit.   

## 2021-06-01 DIAGNOSIS — M7989 Other specified soft tissue disorders: Secondary | ICD-10-CM | POA: Diagnosis not present

## 2021-06-01 DIAGNOSIS — M79662 Pain in left lower leg: Secondary | ICD-10-CM | POA: Diagnosis not present

## 2021-06-02 ENCOUNTER — Encounter: Payer: BC Managed Care – PPO | Admitting: Family Medicine

## 2021-06-02 ENCOUNTER — Telehealth (HOSPITAL_COMMUNITY): Payer: Self-pay | Admitting: *Deleted

## 2021-06-02 NOTE — Telephone Encounter (Signed)
Close encounter 

## 2021-06-03 ENCOUNTER — Encounter (HOSPITAL_COMMUNITY): Payer: Self-pay | Admitting: *Deleted

## 2021-06-03 ENCOUNTER — Ambulatory Visit (HOSPITAL_COMMUNITY)
Admission: RE | Admit: 2021-06-03 | Discharge: 2021-06-03 | Disposition: A | Discharge status: Home / Self Care | Payer: BC Managed Care – PPO | Source: Ambulatory Visit | Attending: Cardiology | Admitting: Cardiology

## 2021-06-03 ENCOUNTER — Encounter (HOSPITAL_COMMUNITY): Payer: Self-pay

## 2021-06-03 ENCOUNTER — Other Ambulatory Visit: Payer: Self-pay

## 2021-06-03 DIAGNOSIS — I480 Paroxysmal atrial fibrillation: Secondary | ICD-10-CM

## 2021-06-03 DIAGNOSIS — R931 Abnormal findings on diagnostic imaging of heart and coronary circulation: Secondary | ICD-10-CM

## 2021-06-03 DIAGNOSIS — I10 Essential (primary) hypertension: Secondary | ICD-10-CM

## 2021-06-03 NOTE — Progress Notes (Signed)
Pt came for ETT and held Bisoprolol. Pt's pretest BP was 214/62. After waiting 10 min. BP was 218/79. Spoke with Dr Martin[place] who said to cancel test. Will also send message to Dr Percival Spanish.

## 2021-06-14 DIAGNOSIS — H6990 Unspecified Eustachian tube disorder, unspecified ear: Secondary | ICD-10-CM | POA: Diagnosis not present

## 2021-06-22 ENCOUNTER — Other Ambulatory Visit: Payer: Self-pay | Admitting: Cardiology

## 2021-06-26 ENCOUNTER — Ambulatory Visit (INDEPENDENT_AMBULATORY_CARE_PROVIDER_SITE_OTHER): Payer: BC Managed Care – PPO | Admitting: Family Medicine

## 2021-06-26 ENCOUNTER — Encounter: Payer: Self-pay | Admitting: Family Medicine

## 2021-06-26 ENCOUNTER — Other Ambulatory Visit: Payer: Self-pay

## 2021-06-26 ENCOUNTER — Other Ambulatory Visit (HOSPITAL_COMMUNITY)
Admission: RE | Admit: 2021-06-26 | Discharge: 2021-06-26 | Disposition: A | Discharge status: Home / Self Care | Payer: BC Managed Care – PPO | Source: Ambulatory Visit | Attending: Family Medicine | Admitting: Family Medicine

## 2021-06-26 VITALS — BP 140/80 | HR 77 | Temp 98.1°F | Ht 65.0 in | Wt 200.0 lb

## 2021-06-26 DIAGNOSIS — Z0000 Encounter for general adult medical examination without abnormal findings: Secondary | ICD-10-CM | POA: Diagnosis not present

## 2021-06-26 DIAGNOSIS — E2839 Other primary ovarian failure: Secondary | ICD-10-CM

## 2021-06-26 DIAGNOSIS — I4891 Unspecified atrial fibrillation: Secondary | ICD-10-CM

## 2021-06-26 DIAGNOSIS — E871 Hypo-osmolality and hyponatremia: Secondary | ICD-10-CM

## 2021-06-26 DIAGNOSIS — E785 Hyperlipidemia, unspecified: Secondary | ICD-10-CM

## 2021-06-26 DIAGNOSIS — Z124 Encounter for screening for malignant neoplasm of cervix: Secondary | ICD-10-CM | POA: Insufficient documentation

## 2021-06-26 DIAGNOSIS — J4530 Mild persistent asthma, uncomplicated: Secondary | ICD-10-CM

## 2021-06-26 DIAGNOSIS — I10 Essential (primary) hypertension: Secondary | ICD-10-CM

## 2021-06-26 DIAGNOSIS — D1430 Benign neoplasm of unspecified bronchus and lung: Secondary | ICD-10-CM

## 2021-06-26 NOTE — Assessment & Plan Note (Signed)
Stable control off Qvar at this time.

## 2021-06-26 NOTE — Assessment & Plan Note (Signed)
Stable, chronic.  Continue current medication.   crestor 20 mg daily

## 2021-06-26 NOTE — Assessment & Plan Note (Signed)
Stable, chronic.  Continue current medication.   bisoprolol 5 mg daily, Cardizem CD 240 mg, hydralazine 25 mg twice daily and Aldactone 50 mg a day.

## 2021-06-26 NOTE — Patient Instructions (Addendum)
Restrict water and add salt to foods.  Consider shingrix and pneumonia vaccine given you have asthma.  Schedule bone density along with mammogram in 6 months.  Please call the location of your choice from the menu below to schedule your Mammogram and/or Bone Density appointment.      [facility]   Phone:  [phone]   [place], [place] [address]                                            Services: 3D Mammogram and Rafael Capo  [place] [facility])  Phone:  [phone]   [address] [place] [place] [address]                                              Services:  3D Mammogram and Bone Density

## 2021-06-26 NOTE — Assessment & Plan Note (Addendum)
On anticoagulation: ELiquis  Rate and rythym control: flecanide, bisoprolol 5 mg daily, Cardizem CD 240 mg,

## 2021-06-26 NOTE — Progress Notes (Signed)
Patient ID: Amanda Estrada, female    DOB: 11-24-1960, 60 y.o.   MRN: [id number]  This visit was conducted in person.  BP 140/80   Pulse 77   Temp 98.1 F (36.7 C) (Temporal)   Ht 5\' 5"  (1.651 m)   Wt 200 lb (90.7 kg)   SpO2 99%   BMI 33.28 kg/m    CC: Chief Complaint  Patient presents with   Annual Exam    Subjective:   HPI: Amanda Estrada is a 60 y.o. female presenting on 06/26/2021 for Annual Exam   She has noted right ear fullness in last few months.  No ear pain, no congestion, sinus pain.  Using flonase 1 spray per nostril.   Paroxsymal Atrial fibrillation:  on Eliquis anticoagulation Rate controlled with coreg, cardiazem metoprolol prn and on eliquis for anticoagulation.  Cardiology:  Last OV reviewed  Hochrein  03/23/21 Exercise tolerance test 06/03/21 but she was unable to do given she was anxious.   HX of Closed fracture of left tibia followed by orthopedics.Marland Kitchen given swelling had  doppler.. no DVT.  Hypertension:  Borderline control on bisoprolol 5 mg daily, Cardizem CD 240 mg, hydralazine 25 mg twice daily and Aldactone 50 mg a day.  BP Readings from Last 3 Encounters:  06/26/21 140/80  03/23/21 126/74  02/17/21 138/60  Using medication without problems or lightheadedness:  none Chest pain with exertion:none Edema:none Short of breath:none Average home BPs: 130-140/70-80 Other issues:  Elevated Cholesterol: LDL at goal  < 70 on crestor Lab Results  Component Value Date   CHOL 160 05/27/2021   HDL 76.60 05/27/2021   LDLCALC 60 05/27/2021   LDLDIRECT 81.0 10/16/2010   TRIG 117.0 05/27/2021   CHOLHDL 2 05/27/2021  Using medications without problems: Muscle aches:  Diet compliance: heart healthy diet Exercise: minimal Other complaints: plans to increase exercise given planning on retiring   Hyponatremia:  likely due to medication.Marland Kitchen spironolactone.    Mild persistent asthma, stable control . Using Qvar now prn.   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 now on omeprazole only once daily.. per [place].   Relevant past medical, surgical, family and social history reviewed and updated as indicated. Interim medical history since our last visit reviewed. Allergies and medications reviewed and updated. Outpatient Medications Prior to Visit  Medication Sig Dispense Refill   albuterol (VENTOLIN HFA) 108 (90 Base) MCG/ACT inhaler Inhale 2 puffs into the lungs every 4 (four) hours as needed for wheezing or shortness of breath. 8 g 2   beclomethasone (QVAR) 80 MCG/ACT inhaler Inhale 1 puff into the lungs in the morning and at bedtime. 1 each 12   bisoprolol (ZEBETA) 5 MG tablet TAKE 1 TABLET BY MOUTH EVERY DAY 90 tablet 3   cloNIDine (CATAPRES) 0.1 MG tablet TAKE 1 TABLET BY MOUTH AT BEDTIME. 90 tablet 1   dicyclomine (BENTYL) 10 MG capsule TAKE 1 CAPSULE (10 MG TOTAL) BY MOUTH 4 (FOUR) TIMES DAILY - BEFORE MEALS AND AT BEDTIME. 360 capsule 0   diltiazem (CARDIZEM CD) 240 MG 24 hr capsule TAKE 1 CAPSULE BY MOUTH DAILY. 90 capsule 3   ELIQUIS 5 MG TABS tablet TAKE 1 TABLET BY MOUTH TWICE A DAY 60 tablet 11   fluticasone (FLONASE) 50 MCG/ACT nasal spray PLACE 1 SPRAY INTO BOTH NOSTRILS DAILY AS NEEDED FOR ALLERGIES. 48 mL 3   furosemide (LASIX) 20  MG tablet TAKE 1 TABLET (20 MG TOTAL) BY MOUTH DAILY AS NEEDED FOR EDEMA (ONLY AS NEEDED FOR SWELLING). 30 tablet 0   hydrALAZINE (APRESOLINE) 25 MG tablet TAKE 1 TABLET BY MOUTH TWICE A DAY 180 tablet 3   levocetirizine (XYZAL) 5 MG tablet Take 5 mg by mouth at bedtime.      montelukast (SINGULAIR) 10 MG tablet TAKE 1 TABLET (10 MG TOTAL) BY MOUTH AT BEDTIME. 90 tablet 3   omeprazole (PRILOSEC) 40 MG capsule TAKE 1 CAPSULE BY MOUTH TWICE A DAY 180 capsule 1   rosuvastatin (CRESTOR) 20 MG tablet TAKE 1 TABLET BY MOUTH EVERY DAY 90 tablet 3   spironolactone (ALDACTONE) 50 MG tablet TAKE 1 TABLET  BY MOUTH EVERY DAY 90 tablet 3   metoprolol tartrate (LOPRESSOR) 25 MG tablet Take 1 tablet (25 mg total) by mouth 2 (two) times daily as needed (AS NEEDED FOR PALPITATIONS/ATRIAL FIBRILATION). 180 tablet 3   No facility-administered medications prior to visit.     Per HPI unless specifically indicated in ROS section below Review of Systems  Constitutional:  Negative for fatigue and fever.  HENT:  Negative for ear pain.   Eyes:  Negative for pain.  Respiratory:  Negative for chest tightness and shortness of breath.   Cardiovascular:  Negative for chest pain, palpitations and leg swelling.  Gastrointestinal:  Negative for abdominal pain.  Genitourinary:  Negative for dysuria.  Objective:  BP 140/80   Pulse 77   Temp 98.1 F (36.7 C) (Temporal)   Ht 5\' 5"  (1.651 m)   Wt 200 lb (90.7 kg)   SpO2 99%   BMI 33.28 kg/m   Wt Readings from Last 3 Encounters:  06/26/21 200 lb (90.7 kg)  03/23/21 197 lb (89.4 kg)  02/17/21 200 lb (90.7 kg)      Physical Exam Constitutional:      General: She is not in acute distress.    Appearance: Normal appearance. She is well-developed. She is not ill-appearing or toxic-appearing.  HENT:     Head: Normocephalic.     Right Ear: Hearing, tympanic membrane, ear canal and external ear normal. Tympanic membrane is not erythematous, retracted or bulging.     Left Ear: Hearing, tympanic membrane, ear canal and external ear normal. Tympanic membrane is not erythematous, retracted or bulging.     Nose: Nose normal. No mucosal edema or rhinorrhea.     Right Sinus: No maxillary sinus tenderness or frontal sinus tenderness.     Left Sinus: No maxillary sinus tenderness or frontal sinus tenderness.     Mouth/Throat:     Pharynx: Uvula midline.  Eyes:     General: Lids are normal. Lids are everted, no foreign bodies appreciated.     Conjunctiva/sclera: Conjunctivae normal.     Pupils: Pupils are equal, round, and reactive to light.  Neck:     Thyroid: No  thyroid mass or thyromegaly.     Vascular: No carotid bruit.     Trachea: Trachea normal.  Cardiovascular:     Rate and Rhythm: Normal rate and regular rhythm.     Pulses: Normal pulses.     Heart sounds: Normal heart sounds, S1 normal and S2 normal. No murmur heard.   No friction rub. No gallop.  Pulmonary:     Effort: Pulmonary effort is normal. No tachypnea or respiratory distress.     Breath sounds: Normal breath sounds. No decreased breath sounds, wheezing, rhonchi or rales.  Abdominal:     General:  Bowel sounds are normal. There is no distension or abdominal bruit.     Palpations: Abdomen is soft. There is no fluid wave or mass.     Tenderness: There is no abdominal tenderness. There is no guarding or rebound.     Hernia: No hernia is present.  Genitourinary:    Exam position: Supine.     Labia:        Right: No rash, tenderness or lesion.        Left: No rash, tenderness or lesion.      Vagina: Normal.     Cervix: No cervical motion tenderness, discharge or friability.     Uterus: Not enlarged and not tender.      Adnexa:        Right: No mass, tenderness or fullness.         Left: No mass, tenderness or fullness.    Musculoskeletal:     Cervical back: Normal range of motion and neck supple.  Lymphadenopathy:     Cervical: No cervical adenopathy.  Skin:    General: Skin is warm and dry.     Findings: No rash.  Neurological:     Mental Status: She is alert and oriented to person, place, and time.     GCS: GCS eye subscore is 4. GCS verbal subscore is 5. GCS motor subscore is 6.     Cranial Nerves: No cranial nerve deficit.     Sensory: No sensory deficit.     Motor: No abnormal muscle tone.     Coordination: Coordination normal.     Gait: Gait normal.     Deep Tendon Reflexes: Reflexes are normal and symmetric.     Comments: Nml cerebellar exam   No papilledema  Psychiatric:        Mood and Affect: Mood is not anxious or depressed.        Speech: Speech normal.         Behavior: Behavior normal. Behavior is cooperative.        Thought Content: Thought content normal.        Cognition and Memory: Memory is not impaired. She does not exhibit impaired recent memory or impaired remote memory.        Judgment: Judgment normal.   Left ear irrigated without complication by CMA.. pressure and hearing improved, right ear.    Results for orders placed or performed in visit on 05/27/21  Comprehensive metabolic panel  Result Value Ref Range   Sodium 130 (L) 135 - 145 mEq/L   Potassium 4.8 3.5 - 5.1 mEq/L   Chloride 97 96 - 112 mEq/L   CO2 24 19 - 32 mEq/L   Glucose, Bld 97 70 - 99 mg/dL   BUN 11 6 - 23 mg/dL   Creatinine, Ser 0.74 0.40 - 1.20 mg/dL   Total Bilirubin 0.5 0.2 - 1.2 mg/dL   Alkaline Phosphatase 73 39 - 117 U/L   AST 21 0 - 37 U/L   ALT 26 0 - 35 U/L   Total Protein 7.3 6.0 - 8.3 g/dL   Albumin 4.5 3.5 - 5.2 g/dL   GFR 87.77 >60.00 mL/min   Calcium 9.9 8.4 - 10.5 mg/dL  Lipid panel  Result Value Ref Range   Cholesterol 160 0 - 200 mg/dL   Triglycerides 117.0 0.0 - 149.0 mg/dL   HDL 76.60 >39.00 mg/dL   VLDL 23.4 0.0 - 40.0 mg/dL   LDL Cholesterol 60 0 - 99 mg/dL   Total  CHOL/HDL Ratio 2    NonHDL 83.26     This visit occurred during the SARS-CoV-2 public health emergency.  Safety protocols were in place, including screening questions prior to the visit, additional usage of staff PPE, and extensive cleaning of exam room while observing appropriate contact time as indicated for disinfecting solutions.   COVID 19 screen:  No recent travel or known exposure to COVID19 The patient denies respiratory symptoms of COVID 19 at this time. The importance of social distancing was discussed today.   Assessment and Plan   The patient's preventative maintenance and recommended screening tests for an annual wellness exam were reviewed in full today. Brought up to date unless services declined.  Counselled on the importance of diet, exercise,  and its role in overall health and mortality. The patient's FH and SH was reviewed, including their home life, tobacco status, and drug and alcohol status.   Vaccines: Uptodate tdap,   COVID x 2,   consider shingrix and pneumonia vaccine. Pap/DVE:  11/2015 nml pap, neg HPV, repeat  In 5 years. No family history of ovarian and uterine cancer Mammo:  05/25/2021. Plan repeat in 6 months. Bone Density: plan age 45 given mother's history. Colon:  1/ 2020 , Dr. Loletha Carrow, plan q 5 year, family history of colon ca in Mom and Dad.  Smoking Status: nonsmoker ETOH/ drug use: wine 1 glass daily/none  Hep C:  done  HIV screen:   Refused.  Problem List Items Addressed This Visit     Atrial fibrillation with RVR (HCC)     On anticoagulation: ELiquis  Rate and rythym control: flecanide, bisoprolol 5 mg daily, Cardizem CD 240 mg,       Benign tumor of bronchus and lung   Dyslipidemia    Stable, chronic.  Continue current medication.   crestor 20 mg daily      Essential hypertension    Stable, chronic.  Continue current medication.   bisoprolol 5 mg daily, Cardizem CD 240 mg, hydralazine 25 mg twice daily and Aldactone 50 mg a day.       Hyponatremia   Mild persistent asthma    Stable control off Qvar at this time.      Other Visit Diagnoses     Routine general medical examination at a health care facility    -  Primary   Estrogen deficiency       Relevant Orders   DG Bone Density   Cervical cancer screening       Relevant Orders   Cytology - PAP([place])        Eliezer Lofts, MD

## 2021-06-26 NOTE — Assessment & Plan Note (Signed)
S/P resection. No further hemoptysis

## 2021-06-29 LAB — CYTOLOGY - PAP
Comment: NEGATIVE
Diagnosis: NEGATIVE
High risk HPV: NEGATIVE

## 2021-07-07 ENCOUNTER — Other Ambulatory Visit: Payer: Self-pay

## 2021-07-07 MED ORDER — FUROSEMIDE 20 MG PO TABS
20.0000 mg | ORAL_TABLET | Freq: Every day | ORAL | 6 refills | Status: DC | PRN
Start: 2021-07-07 — End: 2022-09-24

## 2021-08-25 ENCOUNTER — Other Ambulatory Visit: Payer: Self-pay | Admitting: Cardiology

## 2021-11-06 DIAGNOSIS — M545 Low back pain, unspecified: Secondary | ICD-10-CM | POA: Diagnosis not present

## 2021-11-08 ENCOUNTER — Other Ambulatory Visit: Payer: Self-pay | Admitting: Gastroenterology

## 2021-11-08 ENCOUNTER — Other Ambulatory Visit: Payer: Self-pay | Admitting: Cardiology

## 2021-12-16 ENCOUNTER — Ambulatory Visit: Payer: BLUE CROSS/BLUE SHIELD | Admitting: Podiatry

## 2021-12-24 ENCOUNTER — Other Ambulatory Visit: Payer: Self-pay

## 2021-12-24 ENCOUNTER — Ambulatory Visit (INDEPENDENT_AMBULATORY_CARE_PROVIDER_SITE_OTHER): Payer: BC Managed Care – PPO | Admitting: Podiatry

## 2021-12-24 DIAGNOSIS — L84 Corns and callosities: Secondary | ICD-10-CM

## 2021-12-29 NOTE — Progress Notes (Signed)
Subjective:  Patient ID: Amanda Estrada, female    DOB: Dec 24, 1960,  MRN: [id number]  Chief Complaint  Patient presents with   Callouses    Right foot between 4th and 5th toe     61 y.o. female presents with the above complaint.  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  Patient states painful to touch painful to walk and has progressed gotten worse.  She was seen by Dr. Paulla Dolly for something similar a long time ago but no surgery was done.  She wanted get it evaluated discuss other treatment options around elbow.  She has not seen anyone else prior to seeing me.   Review of Systems: Negative except as noted in the HPI. Denies N/V/F/Ch.  Past Medical History:  Diagnosis Date   Allergy    Anxiety    patient denies   Asthma    Cataract    GERD (gastroesophageal reflux disease)    Glaucoma    History of echocardiogram    a. 11/2015: EF 55-60%, no RWMA, nl LV diastolic fxn, PASP nl   Hypertension    Obesity    PAF (paroxysmal atrial fibrillation) ([place])    a. CHADS2VASc = 2 (HTN, sex category)   Sinusitis 2011   Recurrent per Dr. Tami Ribas with [facility]    Current Outpatient Medications:    albuterol (VENTOLIN HFA) 108 (90 Base) MCG/ACT inhaler, Inhale 2 puffs into the lungs every 4 (four) hours as needed for wheezing or shortness of breath., Disp: 8 g, Rfl: 2   beclomethasone (QVAR) 80 MCG/ACT inhaler, Inhale 1 puff into the lungs in the morning and at bedtime., Disp: 1 each, Rfl: 12   bisoprolol (ZEBETA) 5 MG tablet, TAKE 1 TABLET BY MOUTH EVERY DAY, Disp: 90 tablet, Rfl: 3   cloNIDine (CATAPRES) 0.1 MG tablet, TAKE 1 TABLET BY MOUTH EVERYDAY AT BEDTIME, Disp: 90 tablet, Rfl: 3   dicyclomine (BENTYL) 10 MG capsule, TAKE 1 CAPSULE (10 MG TOTAL) BY MOUTH 4 (FOUR) TIMES DAILY - BEFORE MEALS AND AT BEDTIME., Disp: 360 capsule, Rfl: 0   diltiazem (CARDIZEM CD) 240 MG 24 hr capsule, TAKE 1 CAPSULE BY MOUTH DAILY., Disp: 90 capsule, Rfl: 3    ELIQUIS 5 MG TABS tablet, TAKE 1 TABLET BY MOUTH TWICE A DAY, Disp: 60 tablet, Rfl: 11   fluticasone (FLONASE) 50 MCG/ACT nasal spray, PLACE 1 SPRAY INTO BOTH NOSTRILS DAILY AS NEEDED FOR ALLERGIES., Disp: 48 mL, Rfl: 3   furosemide (LASIX) 20 MG tablet, Take 1 tablet (20 mg total) by mouth daily as needed for edema (only as needed for swelling)., Disp: 30 tablet, Rfl: 6   hydrALAZINE (APRESOLINE) 25 MG tablet, TAKE 1 TABLET BY MOUTH TWICE A DAY, Disp: 180 tablet, Rfl: 3   levocetirizine (XYZAL) 5 MG tablet, Take 5 mg by mouth at bedtime. , Disp: , Rfl:    metoprolol tartrate (LOPRESSOR) 25 MG tablet, Take 1 tablet (25 mg total) by mouth 2 (two) times daily as needed (AS NEEDED FOR PALPITATIONS/ATRIAL FIBRILATION)., Disp: 180 tablet, Rfl: 3   montelukast (SINGULAIR) 10 MG tablet, TAKE 1 TABLET (10 MG TOTAL) BY MOUTH AT BEDTIME., Disp: 90 tablet, Rfl: 3   omeprazole (PRILOSEC) 40 MG capsule, TAKE 1 CAPSULE BY MOUTH TWICE A DAY, Disp: 180 capsule, Rfl: 1   rosuvastatin (CRESTOR) 20 MG tablet, TAKE 1 TABLET BY MOUTH EVERY DAY, Disp: 90 tablet, Rfl: 3   spironolactone (ALDACTONE) 50 MG tablet, TAKE 1 TABLET BY MOUTH  EVERY DAY, Disp: 90 tablet, Rfl: 3  Social History   Tobacco Use  Smoking Status Never  Smokeless Tobacco Never    Allergies  Allergen Reactions   Amoxicillin-Pot Clavulanate Other (See Comments)    Diarrhea Has patient had a PCN reaction causing immediate rash, facial/tongue/throat swelling, SOB or lightheadedness with hypotension: No Has patient had a PCN reaction causing severe rash involving mucus membranes or skin necrosis: No Has patient had a PCN reaction that required hospitalization: No Has patient had a PCN reaction occurring within the last 10 years: Yes--diarrhea ONLY If all of the above answers are "NO", then may proceed with Cephalosporin use.    Objective:  There were no vitals filed for this visit. There is no height or weight on file to calculate  BMI. Constitutional Well developed. Well nourished.  Vascular Dorsalis pedis pulses palpable bilaterally. Posterior tibial pulses palpable bilaterally. Capillary refill normal to all digits.  No cyanosis or clubbing noted. Pedal hair growth normal.  Neurologic Normal speech. Oriented to person, place, and time. Epicritic sensation to light touch grossly present bilaterally.  Dermatologic Hyperkeratotic lesion noted to the interdigital space between the fourth and fifth digit.  Pain on palpation to the interdigital space.  No malodor present.  Orthopedic: Normal joint ROM without pain or crepitus bilaterally. No visible deformities. No bony tenderness.   Radiographs: None Assessment:   1. Heloma molle    Plan:  Patient was evaluated and treated and all questions answered.  Right fourth and fifth digit heloma molle -I explained the patient the etiology of heloma molle versus and options were discussed.  I discussed shoe gear modification in extensive detail.  I discussed with the patient she will benefit from toe protector and padding.  If those does not resolve patient may need surgical intervention.  Patient states understanding.  No follow-ups on file.

## 2022-01-12 ENCOUNTER — Other Ambulatory Visit: Payer: Self-pay

## 2022-01-12 MED ORDER — BISOPROLOL FUMARATE 5 MG PO TABS: 5.0000 mg | ORAL_TABLET | Freq: Every day | ORAL | 3 refills | Status: DC

## 2022-01-13 ENCOUNTER — Ambulatory Visit: Payer: BC Managed Care – PPO | Admitting: Dermatology

## 2022-02-04 ENCOUNTER — Telehealth: Payer: Self-pay | Admitting: Cardiology

## 2022-02-04 NOTE — Telephone Encounter (Signed)
Patient out of town and reports afib over 1000 bpm when doing any type of walking (flat, stairs) and gets sob. She denies chest pain, but has dizziness. Dr. Percival Spanish (DOD) recommended patient go to urgent care. Patient stated she will go to urgent care if she feels worse. She stated she will keep upcoming appointment with Melvenia Needles on 4/5. ?

## 2022-02-04 NOTE — Telephone Encounter (Signed)
Patient called in requesting an appointment with Dr. Percival Spanish via patient schedule. For the following symptoms: ?"Having AFIB when walking short distances - get short of breath when walking. Was supposed to have a stress test, but blood pressure and heart rate was too high for procedure.  Now I can?t walk short distance without my heart rate going over 100." Please advise. ?

## 2022-02-08 ENCOUNTER — Other Ambulatory Visit: Payer: Self-pay

## 2022-02-08 MED ORDER — APIXABAN 5 MG PO TABS
5.0000 mg | ORAL_TABLET | Freq: Two times a day (BID) | ORAL | 11 refills | Status: AC
Start: 2022-02-08 — End: ?

## 2022-02-08 NOTE — Telephone Encounter (Signed)
Prescription refill request for Eliquis received. ?Indication:Afib ?Last office visit:5/22 ?Scr:0.7 ?Age: 61 ?Weight:90.7 kg ? ?Prescription refilled ? ?

## 2022-02-09 NOTE — Progress Notes (Signed)
?Cardiology Office Note:   ? ?Date:  02/10/2022  ? ?ID:  Amanda Estrada, DOB 04-07-1961, MRN [id number] ? ?PCP:  Jinny Sanders, MD ?[place] Cardiologist: Minus Breeding, MD  ? ?Reason for visit: A-fib and shortness of breath ? ?History of Present Illness:   ? ?Amanda Estrada is a 61 y.o. female with a hx of atrial fibrillation, CAD by CT scan, asthma, GERD, hypertension, obesity.  Patient had coronary calcium score 2016 with Coronary calcium score of 239. This was 84 percentile for age and sex matched control. All calcium located in proximal to mid LAD.  She was started Crestor. ? ?Patient called our office on February 04, 2022 and complained of A-fib with fast heart rate as well as shortness of breath with minimal exertion. ? ?Today, she states that typically she only had atrial fibrillation epsiodes once per month.  However, starting March 1, she has had frequent palpitations typically daily with minimal exertion (caught on her apple watch).  Palpitations associated with shortness of breath, chest heaviness and lightheadedness.  No syncope, PND, orthopnea or lower extremity edema.  Palpitations can last an hour and she rests.  When palpitations start she takes metoprolol tartrate 25 mg.  Otherwise for maintenance rate control she takes Bisoprolol 5 mg daily and Cardizem CD 240 mg daily.  She denies sleep apnea.  She states these palpitations have greatly limited her activities.  Palpitations can occur with walking, going up steps or bending over. ? ?  ?Past Medical History:  ?Diagnosis Date  ? Allergy   ? Anxiety   ? patient denies  ? Asthma   ? Cataract   ? Dysplastic nevus 08/28/2018  ? R paraspinal mid to upper back - severe, excision 11/21/2018  ? Dysplastic nevus 08/28/2018  ? L lat breast sup - moderate  ? Dysplastic nevus 07/02/2019  ? R mid to low back 4.0 cm lat to spine - moderate  ? Dysplastic nevus 07/02/2019  ? R inframammary - moderate  ? GERD (gastroesophageal reflux disease)   ?  Glaucoma   ? History of echocardiogram   ? a. 11/2015: EF 55-60%, no RWMA, nl LV diastolic fxn, PASP nl  ? Hypertension   ? Obesity   ? PAF (paroxysmal atrial fibrillation) ([place])   ? a. CHADS2VASc = 2 (HTN, sex category)  ? Sinusitis 2011  ? Recurrent per Dr. Tami Ribas with [facility]  ? ? ?Past Surgical History:  ?Procedure Laterality Date  ? BUNIONECTOMY  11/2007  ? Right foot, 2 hammer-toes and tendon replacement (Dr. Paulla Dolly)  ? cataract Bilateral 06/2018  ? narrow angle glaucoma bilateral  ? CERVICAL DISCECTOMY  2001  ? C5/6  ? [place]  ? Due to incr BP  ? COLONOSCOPY    ? ENDOMETRIAL BIOPSY  03/27/2008  ? B9 endometrium w/breakdown changes (Dr, Kinscius)  ? LASER BRONCHOSCOPY N/A 09/19/2017  ? Procedure: LASER BRONCHOSCOPY;  Surgeon: Melrose Nakayama, MD;  Location: [facility] OR;  Service: Thoracic;  Laterality: N/A;  ? Lasix eye surgery    ? LUNG SURGERY Left 09/19/2017  ? benign tumor removed  ? NSVD  1989  ? VBAC  ? Renal artery [place]  02/2007  ? [place] arteries but no stenosis  ? TIBIA FRACTURE SURGERY Left 12/2017  ? US TRANSVAGINAL PELVIC MODIFIED  04/09/2008  ? Normal  ? VIDEO BRONCHOSCOPY Bilateral 08/10/2017  ? Procedure: VIDEO BRONCHOSCOPY WITHOUT FLUORO;  Surgeon: Tanda Rockers, MD;  Location: [facility];  Service: Cardiopulmonary;  Laterality: Bilateral;  ? VIDEO BRONCHOSCOPY N/A 09/19/2017  ? Procedure: VIDEO BRONCHOSCOPY, with endobronchial tumor resection;  Surgeon: Melrose Nakayama, MD;  Location: [facility] OR;  Service: Thoracic;  Laterality: N/A;  ? VIDEO BRONCHOSCOPY Bilateral 08/16/2018  ? Procedure: VIDEO BRONCHOSCOPY WITHOUT FLUORO;  Surgeon: Tanda Rockers, MD;  Location: [facility];  Service: Cardiopulmonary;  Laterality: Bilateral;  ? ? ?Current Medications: ?Current Meds  ?Medication Sig  ? albuterol (VENTOLIN HFA) 108 (90 Base) MCG/ACT inhaler Inhale 2 puffs into the lungs every 4 (four) hours as needed for wheezing or shortness of breath.  ? apixaban (ELIQUIS) 5 MG TABS  tablet Take 1 tablet (5 mg total) by mouth 2 (two) times daily.  ? beclomethasone (QVAR) 80 MCG/ACT inhaler Inhale 1 puff into the lungs in the morning and at bedtime.  ? cloNIDine (CATAPRES) 0.1 MG tablet TAKE 1 TABLET BY MOUTH EVERYDAY AT BEDTIME  ? dicyclomine (BENTYL) 10 MG capsule TAKE 1 CAPSULE (10 MG TOTAL) BY MOUTH 4 (FOUR) TIMES DAILY - BEFORE MEALS AND AT BEDTIME.  ? diltiazem (CARDIZEM CD) 240 MG 24 hr capsule TAKE 1 CAPSULE BY MOUTH DAILY.  ? fluticasone (FLONASE) 50 MCG/ACT nasal spray PLACE 1 SPRAY INTO BOTH NOSTRILS DAILY AS NEEDED FOR ALLERGIES.  ? furosemide (LASIX) 20 MG tablet Take 1 tablet (20 mg total) by mouth daily as needed for edema (only as needed for swelling).  ? hydrALAZINE (APRESOLINE) 25 MG tablet TAKE 1 TABLET BY MOUTH TWICE A DAY  ? levocetirizine (XYZAL) 5 MG tablet Take 5 mg by mouth at bedtime.   ? montelukast (SINGULAIR) 10 MG tablet TAKE 1 TABLET (10 MG TOTAL) BY MOUTH AT BEDTIME.  ? omeprazole (PRILOSEC) 40 MG capsule TAKE 1 CAPSULE BY MOUTH TWICE A DAY  ? rosuvastatin (CRESTOR) 20 MG tablet TAKE 1 TABLET BY MOUTH EVERY DAY  ? spironolactone (ALDACTONE) 50 MG tablet TAKE 1 TABLET BY MOUTH EVERY DAY  ? [DISCONTINUED] bisoprolol (ZEBETA) 5 MG tablet Take 1 tablet (5 mg total) by mouth daily.  ? [DISCONTINUED] metoprolol tartrate (LOPRESSOR) 25 MG tablet Take 1 tablet (25 mg total) by mouth 2 (two) times daily as needed (AS NEEDED FOR PALPITATIONS/ATRIAL FIBRILATION).  ?  ? ?Allergies:   Amoxicillin-pot clavulanate  ? ?Social History  ? ?Socioeconomic History  ? Marital status: Married  ?  Spouse name: Not on file  ? Number of children: 2  ? Years of education: Not on file  ? Highest education level: Not on file  ?Occupational History  ? Occupation: Air cabin crew, Museum/gallery curator  ?  Employer: [employer]  ?Tobacco Use  ? Smoking status: Never  ? Smokeless tobacco: Never  ?Vaping Use  ? Vaping Use: Never used  ?Substance and Sexual Activity  ? Alcohol use: Yes  ?   Alcohol/week: 14.0 standard drinks  ?  Types: 14 Glasses of wine per week  ?  Comment: 2 glasses of wine every day  ? Drug use: No  ? Sexual activity: Not on file  ?Other Topics Concern  ? Not on file  ?Social History Narrative  ? No regular exercise, used to but unable to do given chronic cough.  ? ?Social Determinants of Health  ? ?Financial Resource Strain: Not on file  ?Food Insecurity: Not on file  ?Transportation Needs: Not on file  ?Physical Activity: Not on file  ?Stress: Not on file  ?Social Connections: Not on file  ?  ? ?Family History: ?The patient's family history  includes Cancer - Colon (age of onset: 52) in her mother; Colon cancer in her mother; Colon cancer (age of onset: 56) in her father; Colon polyps in her brother, sister, sister, and sister; Heart disease (age of onset: 56) in her father; Hypertension in her brother, father, and sister; Melanoma in her mother; Pneumonia in her father. There is no history of Esophageal cancer, Rectal cancer, or Stomach cancer. ? ?ROS:   ?Please see the history of present illness.    ? ?EKGs/Labs/Other Studies Reviewed:   ? ?EKG:  The ekg ordered today demonstrates normal sinus rhythm, heart rate 67, incomplete right bundle branch block, ST abnormality. ? ?Recent Labs: ?05/27/2021: ALT 26; BUN 11; Creatinine, Ser 0.74; Potassium 4.8; Sodium 130  ? ?Recent Lipid Panel ?Lab Results  ?Component Value Date/Time  ? CHOL 160 05/27/2021 08:40 AM  ? TRIG 117.0 05/27/2021 08:40 AM  ? HDL 76.60 05/27/2021 08:40 AM  ? LDLCALC 60 05/27/2021 08:40 AM  ? LDLDIRECT 81.0 10/16/2010 09:39 AM  ? ? ?Physical Exam:   ? ?VS:  BP (!) 142/84   Pulse 67   Ht 5\' 6"  (1.676 m)   Wt 202 lb 12.8 oz (92 kg)   SpO2 99%   BMI 32.73 kg/m?    ?No data found. ? ?Wt Readings from Last 3 Encounters:  ?02/10/22 202 lb 12.8 oz (92 kg)  ?06/26/21 200 lb (90.7 kg)  ?03/23/21 197 lb (89.4 kg)  ?  ? ?GEN:  Well nourished, well developed in no acute distress ?HEENT: Normal ?NECK: No JVD; No carotid  bruits ?CARDIAC: RRR, no murmurs, rubs, gallops ?RESPIRATORY:  Clear to auscultation without rales, wheezing or rhonchi  ?ABDOMEN: Soft, non-tender, non-distended ?MUSCULOSKELETAL: No edema; No deformity  ?SKIN

## 2022-02-10 ENCOUNTER — Encounter: Payer: Self-pay | Admitting: Physician Assistant

## 2022-02-10 ENCOUNTER — Ambulatory Visit: Payer: BC Managed Care – PPO | Admitting: Physician Assistant

## 2022-02-10 DIAGNOSIS — R0609 Other forms of dyspnea: Secondary | ICD-10-CM

## 2022-02-10 DIAGNOSIS — I10 Essential (primary) hypertension: Secondary | ICD-10-CM

## 2022-02-10 DIAGNOSIS — E785 Hyperlipidemia, unspecified: Secondary | ICD-10-CM | POA: Diagnosis not present

## 2022-02-10 DIAGNOSIS — R931 Abnormal findings on diagnostic imaging of heart and coronary circulation: Secondary | ICD-10-CM | POA: Diagnosis not present

## 2022-02-10 DIAGNOSIS — I480 Paroxysmal atrial fibrillation: Secondary | ICD-10-CM

## 2022-02-10 DIAGNOSIS — Z7901 Long term (current) use of anticoagulants: Secondary | ICD-10-CM

## 2022-02-10 MED ORDER — BISOPROLOL FUMARATE 10 MG PO TABS: 10.0000 mg | ORAL_TABLET | Freq: Every day | ORAL | 3 refills | Status: AC

## 2022-02-10 MED ORDER — METOPROLOL TARTRATE 25 MG PO TABS: 25.0000 mg | ORAL_TABLET | Freq: Two times a day (BID) | ORAL | 3 refills | Status: DC | PRN

## 2022-02-10 NOTE — Patient Instructions (Addendum)
Medication Instructions:  ?Increase Bisoprolol to 10 mg ( Take 1 Tablet Daily). ? ?*If you need a refill on your cardiac medications before your next appointment, please call your pharmacy* ? ? ?Lab Work: ?BMET, CBC, Magnesium and TSH. Today ?If you have labs (blood work) drawn today and your tests are completely normal, you will receive your results only by: ?MyChart Message (if you have MyChart) OR ?A paper copy in the mail ?If you have any lab test that is abnormal or we need to change your treatment, we will call you to review the results. ? ?Testing: [address], [address] ?Your physician has requested that you have an echocardiogram. Echocardiography is a painless test that uses sound waves to create images of your heart. It provides your doctor with information about the size and shape of your heart and how well your heart?s chambers and valves are working. This procedure takes approximately one hour. There are no restrictions for this procedure.  ? ? ?Follow-Up: ?At [facility], you and your health needs are our priority.  As part of our continuing mission to provide you with exceptional heart care, we have created designated Provider Care Teams.  These Care Teams include your primary Cardiologist (physician) and Advanced Practice Providers (APPs -  Physician Assistants and Nurse Practitioners) who all work together to provide you with the care you need, when you need it. ? ?We recommend signing up for the patient portal called "MyChart".  Sign up information is provided on this After Visit Summary.  MyChart is used to connect with patients for Virtual Visits (Telemedicine).  Patients are able to view lab/test results, encounter notes, upcoming appointments, etc.  Non-urgent messages can be sent to your provider as well.   ?To learn more about what you can do with MyChart, go to [email or web address].   ? ?Your next appointment:   ?2-3 month(s) ? ?The format for your next appointment:   ?In  Person ? ?Provider:   ?Minus Breeding, MD   ? ? ? ? ?Your cardiac CT will be scheduled at one of the below locations:  ? ?[facility] ?[address] ?[place], [place] [address] ?[phone] ? ? ?If scheduled at [facility] please arrive at the [facility] and Children's Entrance (Entrance C2) of [facility] 30 minutes prior to test start time. ?You can use the FREE valet parking offered at entrance C (encouraged to control the heart rate for the test)  ?Proceed to the [facility] Radiology Department (first floor) to check-in and test prep. ? ?All radiology patients and guests should use entrance C2 at [facility], accessed from [facility], even though the hospital's physical address listed is [address] ? ? ? ?If scheduled at [facility], please arrive 15 mins early for check-in and test prep. ? ?Please follow these instructions carefully (unless otherwise directed): ? ?Hold all erectile dysfunction medications at least 3 days (72 hrs) prior to test. ? ?On the Night Before the Test: ?Be sure to Drink plenty of water. ?Do not consume any caffeinated/decaffeinated beverages or chocolate 12 hours prior to your test. ?Do not take any antihistamines 12 hours prior to your test. ?If the patient has contrast allergy: ?Patient will need a prescription for Prednisone and very clear instructions (as follows): ?Prednisone 50 mg - take 13 hours prior to test ?Take another Prednisone 50 mg 7 hours prior to test ?Take another Prednisone 50 mg 1 hour prior  to test ?Take Benadryl 50 mg 1 hour prior to test ?Patient must complete all four doses of above prophylactic medications. ?Patient will need a ride after test due to Benadryl. ? ?On the Day of the Test: ?Drink plenty of water until 1 hour prior to the test. ?Do not eat any food 4 hours prior to the test. ?You may take your regular medications prior to the test.  ?Take metoprolol (Lopressor)  two hours prior to test. ?HOLD Furosemide/Hydrochlorothiazide morning of the test. ?FEMALES- please wear underwire-free bra if available, avoid dresses & tight clothing ? ? ?*For Clinical Staff only. Please instruct patient the following:* ?Heart Rate Medication Recommendations for Cardiac CT  ?Resting HR < 50 bpm  ?No medication  ?Resting HR 50-60 bpm and BP >110/50 mmHG   ?Consider Metoprolol tartrate 25 mg PO 90-120 min prior to scan  ?Resting HR 60-65 bpm and BP >110/50 mmHG  ?Metoprolol tartrate 50 mg PO 90-120 minutes prior to scan   ?Resting HR > 65 bpm and BP >110/50 mmHG  ?Metoprolol tartrate 100 mg PO 90-120 minutes prior to scan  ?Consider Ivabradine 10-15 mg PO or a calcium channel blocker for resting HR >60 bpm and contraindication to metoprolol tartrate  ?Consider Ivabradine 10-15 mg PO in combination with metoprolol tartrate for HR >80 bpm   ?Day of Test you Can Take 4 25 mg Tablet of Metoprolol Tarttrate . ?     ?After the Test: ?Drink plenty of water. ?After receiving IV contrast, you may experience a mild flushed feeling. This is normal. ?On occasion, you may experience a mild rash up to 24 hours after the test. This is not dangerous. If this occurs, you can take Benadryl 25 mg and increase your fluid intake. ?If you experience trouble breathing, this can be serious. If it is severe call 911 IMMEDIATELY. If it is mild, please call our office. ?If you take any of these medications: Glipizide/Metformin, Avandament, Glucavance, please do not take 48 hours after completing test unless otherwise instructed. ? ?We will call to schedule your test 2-4 weeks out understanding that some insurance companies will need an authorization prior to the service being performed.  ? ?For non-scheduling related questions, please contact the cardiac imaging nurse navigator should you have any questions/concerns: ?Marchia Bond, Cardiac Imaging Nurse Navigator ?Gordy Clement, Cardiac Imaging Nurse Navigator ?[facility] ?Direct Office Dial: [phone]  ? ?For scheduling needs, including cancellations and rescheduling, please call [place], [phone].  ?

## 2022-02-11 LAB — MAGNESIUM: Magnesium: 2.1 mg/dL (ref 1.6–2.3)

## 2022-02-11 LAB — BASIC METABOLIC PANEL
BUN/Creatinine Ratio: 11 — ABNORMAL LOW (ref 12–28)
BUN: 10 mg/dL (ref 8–27)
CO2: 21 mmol/L (ref 20–29)
Calcium: 10.1 mg/dL (ref 8.7–10.3)
Chloride: 94 mmol/L — ABNORMAL LOW (ref 96–106)
Creatinine, Ser: 0.87 mg/dL (ref 0.57–1.00)
Glucose: 91 mg/dL (ref 70–99)
Potassium: 4.5 mmol/L (ref 3.5–5.2)
Sodium: 132 mmol/L — ABNORMAL LOW (ref 134–144)
eGFR: 76 mL/min/{1.73_m2} (ref >59)

## 2022-02-11 LAB — TSH: TSH: 3.07 u[IU]/mL (ref 0.450–4.500)

## 2022-02-11 LAB — CBC
Hematocrit: 39 % (ref 34.0–46.6)
Hemoglobin: 13.2 g/dL (ref 11.1–15.9)
MCH: 31.8 pg (ref 26.6–33.0)
MCHC: 33.8 g/dL (ref 31.5–35.7)
MCV: 94 fL (ref 79–97)
Platelets: 183 10*3/uL (ref 150–450)
RBC: 4.15 x10E6/uL (ref 3.77–5.28)
RDW: 12.8 % (ref 11.7–15.4)
WBC: 8.3 10*3/uL (ref 3.4–10.8)

## 2022-02-12 ENCOUNTER — Telehealth: Payer: Self-pay

## 2022-02-12 NOTE — Telephone Encounter (Addendum)
Called patient regarding results. Patient had understanding of results.----- Message from Warren Lacy, PA-C sent at 02/11/2022  8:59 AM EDT ----- ?Normal potassium and kidney function.  Normal thyroid level.  Normal blood cell counts.  Normal magnesium level. ?

## 2022-02-22 ENCOUNTER — Ambulatory Visit (HOSPITAL_COMMUNITY): Payer: BC Managed Care – PPO | Attending: Cardiology

## 2022-02-22 ENCOUNTER — Other Ambulatory Visit: Payer: Self-pay | Admitting: Cardiology

## 2022-02-22 ENCOUNTER — Other Ambulatory Visit: Payer: Self-pay

## 2022-02-22 ENCOUNTER — Other Ambulatory Visit: Payer: Self-pay | Admitting: Gastroenterology

## 2022-02-22 DIAGNOSIS — I10 Essential (primary) hypertension: Secondary | ICD-10-CM | POA: Insufficient documentation

## 2022-02-22 DIAGNOSIS — Z7901 Long term (current) use of anticoagulants: Secondary | ICD-10-CM

## 2022-02-22 DIAGNOSIS — R0609 Other forms of dyspnea: Secondary | ICD-10-CM | POA: Diagnosis not present

## 2022-02-22 DIAGNOSIS — R079 Chest pain, unspecified: Secondary | ICD-10-CM

## 2022-02-22 DIAGNOSIS — R931 Abnormal findings on diagnostic imaging of heart and coronary circulation: Secondary | ICD-10-CM | POA: Diagnosis not present

## 2022-02-22 DIAGNOSIS — E785 Hyperlipidemia, unspecified: Secondary | ICD-10-CM | POA: Insufficient documentation

## 2022-02-22 DIAGNOSIS — I480 Paroxysmal atrial fibrillation: Secondary | ICD-10-CM | POA: Diagnosis not present

## 2022-02-22 LAB — ECHOCARDIOGRAM COMPLETE: S' Lateral: 2.4 cm

## 2022-02-22 MED ORDER — SPIRONOLACTONE 50 MG PO TABS
50.0000 mg | ORAL_TABLET | Freq: Every day | ORAL | 3 refills | Status: DC
Start: 2022-02-22 — End: 2022-09-24

## 2022-02-22 MED ORDER — METOPROLOL TARTRATE 25 MG PO TABS
25.0000 mg | ORAL_TABLET | Freq: Two times a day (BID) | ORAL | 3 refills | Status: DC | PRN
Start: 2022-02-22 — End: 2022-03-30

## 2022-02-22 MED ORDER — HYDRALAZINE HCL 25 MG PO TABS: 25.0000 mg | ORAL_TABLET | Freq: Two times a day (BID) | ORAL | 3 refills | Status: DC

## 2022-02-24 ENCOUNTER — Telehealth: Payer: Self-pay

## 2022-02-24 NOTE — Telephone Encounter (Addendum)
Called patient regarding results. Patient had understanding of results.----- Message from Warren Lacy, PA-C sent at 02/23/2022 10:23 PM EDT ----- ?Normal ejection fraction, EF 60-65%, mild LVH, no significant valve disease, normal left atria size. ?

## 2022-02-26 ENCOUNTER — Telehealth (HOSPITAL_COMMUNITY): Payer: Self-pay | Admitting: *Deleted

## 2022-02-26 NOTE — Telephone Encounter (Signed)
Reaching out to patient to offer assistance regarding upcoming cardiac imaging study; pt verbalizes understanding of appt date/time, parking situation and where to check in, pre-test NPO status and medications ordered, and verified current allergies; name and call back number provided for further questions should they arise  Giovan Pinsky RN Navigator Cardiac Imaging [facility] [phone] office [phone] cell  Patient to take 50mg metoprolol tartrate two hours prior to her cardiac CT scan.  She is aware to arrive at 11:30am. 

## 2022-03-01 ENCOUNTER — Ambulatory Visit (HOSPITAL_COMMUNITY)
Admission: RE | Admit: 2022-03-01 | Discharge: 2022-03-01 | Disposition: A | Discharge status: Home / Self Care | Payer: BC Managed Care – PPO | Source: Ambulatory Visit | Attending: Physician Assistant | Admitting: Physician Assistant

## 2022-03-01 ENCOUNTER — Encounter (HOSPITAL_COMMUNITY): Payer: Self-pay

## 2022-03-01 DIAGNOSIS — I480 Paroxysmal atrial fibrillation: Secondary | ICD-10-CM | POA: Insufficient documentation

## 2022-03-01 DIAGNOSIS — R931 Abnormal findings on diagnostic imaging of heart and coronary circulation: Secondary | ICD-10-CM | POA: Diagnosis not present

## 2022-03-01 DIAGNOSIS — I10 Essential (primary) hypertension: Secondary | ICD-10-CM | POA: Insufficient documentation

## 2022-03-01 DIAGNOSIS — E785 Hyperlipidemia, unspecified: Secondary | ICD-10-CM | POA: Insufficient documentation

## 2022-03-01 DIAGNOSIS — Z7901 Long term (current) use of anticoagulants: Secondary | ICD-10-CM | POA: Diagnosis not present

## 2022-03-01 DIAGNOSIS — R0609 Other forms of dyspnea: Secondary | ICD-10-CM | POA: Diagnosis not present

## 2022-03-01 MED ORDER — NITROGLYCERIN 0.4 MG SL SUBL
0.8000 mg | SUBLINGUAL_TABLET | Freq: Once | SUBLINGUAL | Status: AC
Start: 2022-03-01 — End: 2022-03-01
  Administered 2022-03-01: 0.8 mg via SUBLINGUAL

## 2022-03-01 MED ORDER — NITROGLYCERIN 0.4 MG SL SUBL
SUBLINGUAL_TABLET | SUBLINGUAL | Status: AC
  Filled 2022-03-01: qty 2

## 2022-03-01 NOTE — Progress Notes (Signed)
CT scan cancelled. Pt in A-fib rate 60's to 110's ?

## 2022-03-02 ENCOUNTER — Encounter: Payer: Self-pay | Admitting: Physician Assistant

## 2022-03-02 NOTE — Progress Notes (Signed)
Patient's heart rate was too elevated for coronary CTA -A-fib with heart rate 60s to 110s. ? ?Therefore, we will place order for cardiac PET to rule out ischemia. ? ?Amanda Presume PA ?

## 2022-03-30 ENCOUNTER — Encounter: Payer: Self-pay | Admitting: Cardiology

## 2022-03-30 ENCOUNTER — Ambulatory Visit: Payer: BC Managed Care – PPO | Admitting: Cardiology

## 2022-03-30 VITALS — BP 128/70 | HR 65 | Ht 66.0 in | Wt 204.6 lb

## 2022-03-30 DIAGNOSIS — G4733 Obstructive sleep apnea (adult) (pediatric): Secondary | ICD-10-CM

## 2022-03-30 DIAGNOSIS — I2510 Atherosclerotic heart disease of native coronary artery without angina pectoris: Secondary | ICD-10-CM

## 2022-03-30 DIAGNOSIS — I4891 Unspecified atrial fibrillation: Secondary | ICD-10-CM | POA: Diagnosis not present

## 2022-03-30 DIAGNOSIS — I10 Essential (primary) hypertension: Secondary | ICD-10-CM | POA: Diagnosis not present

## 2022-03-30 MED ORDER — METOPROLOL TARTRATE 25 MG PO TABS: 25.0000 mg | ORAL_TABLET | Freq: Two times a day (BID) | ORAL | 3 refills | Status: DC | PRN

## 2022-03-30 NOTE — Patient Instructions (Addendum)
Medication Instructions:  Your physician recommends that you continue on your current medications as directed. Please refer to the Current Medication list given to you today. *If you need a refill on your cardiac medications before your next appointment, please call your pharmacy*  Lab Work: None. If you have labs (blood work) drawn today and your tests are completely normal, you will receive your results only by: [place] (if you have MyChart) OR A paper copy in the mail If you have any lab test that is abnormal or we need to change your treatment, we will call you to review the results.  Testing/Procedures: Your physician has requested that you have cardiac CT. Cardiac computed tomography (CT) is a painless test that uses an x-ray machine to take clear, detailed pictures of your heart. For further information please visit [email or web address]. Please follow instruction sheet as given.   Your physician has recommended that you have an ablation. Catheter ablation is a medical procedure used to treat some cardiac arrhythmias (irregular heartbeats). During catheter ablation, a long, thin, flexible tube is put into a blood vessel in your groin (upper thigh), or neck. This tube is called an ablation catheter. It is then guided to your heart through the blood vessel. Radio frequency waves destroy small areas of heart tissue where abnormal heartbeats may cause an arrhythmia to start. Please see the instruction sheet given to you today.   Follow-Up: At [facility], you and your health needs are our priority.  As part of our continuing mission to provide you with exceptional heart care, we have created designated Provider Care Teams.  These Care Teams include your primary Cardiologist (physician) and Advanced Practice Providers (APPs -  Physician Assistants and Nurse Practitioners) who all work together to provide you with the care you need, when you need it.  Your physician wants you to  follow-up in: Please call or mychart Laketra Bowdish RN if you would like to move forward with ablation.   We recommend signing up for the patient portal called "MyChart".  Sign up information is provided on this After Visit Summary.  MyChart is used to connect with patients for Virtual Visits (Telemedicine).  Patients are able to view lab/test results, encounter notes, upcoming appointments, etc.  Non-urgent messages can be sent to your provider as well.   To learn more about what you can do with MyChart, go to [email or web address].    Any Other Special Instructions Will Be Listed Below (If Applicable).  Cardiac Ablation Cardiac ablation is a procedure to destroy (ablate) some heart tissue that is sending bad signals. These bad signals cause problems in heart rhythm. The heart has many areas that make these signals. If there are problems in these areas, they can make the heart beat in a way that is not normal. Destroying some tissues can help make the heart rhythm normal. Tell your doctor about: Any allergies you have. All medicines you are taking. These include vitamins, herbs, eye drops, creams, and over-the-counter medicines. Any problems you or family members have had with medicines that make you fall asleep (anesthetics). Any blood disorders you have. Any surgeries you have had. Any medical conditions you have, such as kidney failure. Whether you are pregnant or may be pregnant. What are the risks? This is a safe procedure. But problems may occur, including: Infection. Bruising and bleeding. Bleeding into the chest. Stroke or blood clots. Damage to nearby areas of your body. Allergies to medicines or dyes. The need for a pacemaker if the  normal system is damaged. Failure of the procedure to treat the problem. What happens before the procedure? Medicines Ask your doctor about: Changing or stopping your normal medicines. This is important. Taking aspirin and ibuprofen. Do not take these  medicines unless your doctor tells you to take them. Taking other medicines, vitamins, herbs, and supplements. General instructions Follow instructions from your doctor about what you cannot eat or drink. Plan to have someone take you home from the hospital or clinic. If you will be going home right after the procedure, plan to have someone with you for 24 hours. Ask your doctor what steps will be taken to prevent infection. What happens during the procedure?  An IV tube will be put into one of your veins. You will be given a medicine to help you relax. The skin on your neck or groin will be numbed. A cut (incision) will be made in your neck or groin. A needle will be put through your cut and into a large vein. A tube (catheter) will be put into the needle. The tube will be moved to your heart. Dye may be put through the tube. This helps your doctor see your heart. Small devices (electrodes) on the tube will send out signals. A type of energy will be used to destroy some heart tissue. The tube will be taken out. Pressure will be held on your cut. This helps stop bleeding. A bandage will be put over your cut. The exact procedure may vary among doctors and hospitals. What happens after the procedure? You will be watched until you leave the hospital or clinic. This includes checking your heart rate, breathing rate, oxygen, and blood pressure. Your cut will be watched for bleeding. You will need to lie still for a few hours. Do not drive for 24 hours or as long as your doctor tells you. Summary Cardiac ablation is a procedure to destroy some heart tissue. This is done to treat heart rhythm problems. Tell your doctor about any medical conditions you may have. Tell him or her about all medicines you are taking to treat them. This is a safe procedure. But problems may occur. These include infection, bruising, bleeding, and damage to nearby areas of your body. Follow what your doctor tells you  about food and drink. You may also be told to change or stop some of your medicines. After the procedure, do not drive for 24 hours or as long as your doctor tells you. This information is not intended to replace advice given to you by your health care provider. Make sure you discuss any questions you have with your health care provider. Document Revised: 09/27/2019 Document Reviewed: 09/27/2019 Elsevier Patient Education  [place].

## 2022-03-30 NOTE — Progress Notes (Signed)
Electrophysiology Office Note:    Date:  03/30/2022   ID:  Amanda Estrada, DOB 1960/12/28, MRN [id number]  PCP:  Amanda Sanders, MD  [facility] Cardiologist:  Amanda Breeding, MD  [facility] Electrophysiologist:  None   Referring MD: Amanda Lacy, PA*   Chief Complaint: New patient consult for Afib ablation  History of Present Illness:    Amanda Estrada is a 61 y.o. female who presents for an evaluation for consideration of Afib ablation at the request of Amanda Estrada. Their medical history includes paroxysmal atrial fibrillation, hypertension, asthma, GERD, and obesity.  She saw Amanda Estrada on 02/10/2022 and reported typically daily palpitations with minimal exertion since 01/06/22, associated with SOB, chest heaviness and lightheadedness. Her Bisoprolol was increased to 10 mg daily. She was referred to EP for consideration of atrial fibrillation ablation.  Overall, she is feeling good today. Lately she has started taking metoprolol every morning, which seems to be helping manage her Afib. Her last known episode was Sunday morning, lasting for about 1.5 hours. During atrial fibrillation, generally she feels rapid heart rates and is short of breath. As her heart rate lowers her fatigue also gradually improves.  Usually she would go into Afib if she exerted himself, including walking for long times or climbing up stairs. This required her to stop and rest.  She denies any chest pain, or peripheral edema. No lightheadedness, headaches, syncope, orthopnea, or PND.      Past Medical History:  Diagnosis Date   Allergy    Anxiety    patient denies   Asthma    Cataract    Dysplastic nevus 08/28/2018   R paraspinal mid to upper back - severe, excision 11/21/2018   Dysplastic nevus 08/28/2018   L lat breast sup - moderate   Dysplastic nevus 07/02/2019   R mid to low back 4.0 cm lat to spine - moderate   Dysplastic nevus 07/02/2019   R  inframammary - moderate   GERD (gastroesophageal reflux disease)    Glaucoma    History of echocardiogram    a. 11/2015: EF 55-60%, no RWMA, nl LV diastolic fxn, PASP nl   Hypertension    Obesity    PAF (paroxysmal atrial fibrillation) ([place])    a. CHADS2VASc = 2 (HTN, sex category)   Sinusitis 2011   Recurrent per Dr. Tami Ribas with [facility]    Past Surgical History:  Procedure Laterality Date   BUNIONECTOMY  11/2007   Right foot, 2 hammer-toes and tendon replacement (Dr. Paulla Dolly)   cataract Bilateral 06/2018   narrow angle glaucoma bilateral   CERVICAL DISCECTOMY  2001   [place]   Due to incr BP   COLONOSCOPY     ENDOMETRIAL BIOPSY  03/27/2008   B9 endometrium w/breakdown changes (Dr, Kinscius)   LASER BRONCHOSCOPY N/A 09/19/2017   Procedure: LASER BRONCHOSCOPY;  Surgeon: Melrose Nakayama, MD;  Location: [place];  Service: Thoracic;  Laterality: N/A;   Lasix eye surgery     LUNG SURGERY Left 09/19/2017   benign tumor removed   NSVD  1989   VBAC   Renal artery [place]  02/2007   [place] arteries but no stenosis   TIBIA FRACTURE SURGERY Left 12/2017   US TRANSVAGINAL PELVIC MODIFIED  04/09/2008   Normal   VIDEO BRONCHOSCOPY Bilateral 08/10/2017   Procedure: VIDEO BRONCHOSCOPY WITHOUT FLUORO;  Surgeon: Tanda Rockers, MD;  Location: [facility];  Service: Cardiopulmonary;  Laterality: Bilateral;   VIDEO BRONCHOSCOPY N/A 09/19/2017   Procedure: VIDEO BRONCHOSCOPY, with endobronchial tumor resection;  Surgeon: Melrose Nakayama, MD;  Location: [place];  Service: Thoracic;  Laterality: N/A;   VIDEO BRONCHOSCOPY Bilateral 08/16/2018   Procedure: VIDEO BRONCHOSCOPY WITHOUT FLUORO;  Surgeon: Tanda Rockers, MD;  Location: [facility];  Service: Cardiopulmonary;  Laterality: Bilateral;    Current Medications: Current Meds  Medication Sig   albuterol (VENTOLIN HFA) 108 (90 Base) MCG/ACT inhaler Inhale 2 puffs into the lungs every 4 (four) hours as  needed for wheezing or shortness of breath.   apixaban (ELIQUIS) 5 MG TABS tablet Take 1 tablet (5 mg total) by mouth 2 (two) times daily.   beclomethasone (QVAR) 80 MCG/ACT inhaler Inhale 1 puff into the lungs in the morning and at bedtime.   bisoprolol (ZEBETA) 10 MG tablet Take 1 tablet (10 mg total) by mouth daily.   cloNIDine (CATAPRES) 0.1 MG tablet TAKE 1 TABLET BY MOUTH EVERYDAY AT BEDTIME   dicyclomine (BENTYL) 10 MG capsule TAKE 1 CAPSULE (10 MG TOTAL) BY MOUTH 4 (FOUR) TIMES DAILY - BEFORE MEALS AND AT BEDTIME.   diltiazem (CARDIZEM CD) 240 MG 24 hr capsule TAKE 1 CAPSULE BY MOUTH EVERY DAY   fluticasone (FLONASE) 50 MCG/ACT nasal spray PLACE 1 SPRAY INTO BOTH NOSTRILS DAILY AS NEEDED FOR ALLERGIES.   furosemide (LASIX) 20 MG tablet Take 1 tablet (20 mg total) by mouth daily as needed for edema (only as needed for swelling).   hydrALAZINE (APRESOLINE) 25 MG tablet Take 1 tablet (25 mg total) by mouth 2 (two) times daily.   levocetirizine (XYZAL) 5 MG tablet Take 5 mg by mouth at bedtime.    metoprolol tartrate (LOPRESSOR) 25 MG tablet Take 1 tablet (25 mg total) by mouth 2 (two) times daily as needed (AS NEEDED FOR PALPITATIONS/ATRIAL FIBRILATION).   montelukast (SINGULAIR) 10 MG tablet TAKE 1 TABLET (10 MG TOTAL) BY MOUTH AT BEDTIME.   omeprazole (PRILOSEC) 40 MG capsule TAKE 1 CAPSULE BY MOUTH TWICE A DAY   rosuvastatin (CRESTOR) 20 MG tablet TAKE 1 TABLET BY MOUTH EVERY DAY   spironolactone (ALDACTONE) 50 MG tablet Take 1 tablet (50 mg total) by mouth daily.     Allergies:   Amoxicillin-pot clavulanate   Social History   Socioeconomic History   Marital status: Married    Spouse name: Not on file   Number of children: 2   Years of education: Not on file   Highest education level: Not on file  Occupational History   Occupation: Air cabin crew, Engineer, materials: [employer]  Tobacco Use   Smoking status: Never   Smokeless tobacco: Never  Vaping  Use   Vaping Use: Never used  Substance and Sexual Activity   Alcohol use: Yes    Alcohol/week: 14.0 standard drinks    Types: 14 Glasses of wine per week    Comment: 2 glasses of wine every day   Drug use: No   Sexual activity: Not on file  Other Topics Concern   Not on file  Social History Narrative   No regular exercise, used to but unable to do given chronic cough.   Social Determinants of Health   Financial Resource Strain: Not on file  Food Insecurity: Not on file  Transportation Needs: Not on file  Physical Activity: Not on file  Stress: Not on file  Social Connections: Not on file     Family History: The patient's  family history includes Cancer - Colon (age of onset: 15) in her mother; Colon cancer in her mother; Colon cancer (age of onset: 64) in her father; Colon polyps in her brother, sister, sister, and sister; Heart disease (age of onset: 40) in her father; Hypertension in her brother, father, and sister; Melanoma in her mother; Pneumonia in her father. There is no history of Esophageal cancer, Rectal cancer, or Stomach cancer.  ROS:   Please see the history of present illness.    (+) Palpitations (+) Fatigue (+) Shortness of breath All other systems reviewed and are negative.  EKGs/Labs/Other Studies Reviewed:    The following studies were reviewed today:  02/22/2022  Echo  1. Left ventricular ejection fraction, by estimation, is 60 to 65%. The  left ventricle has normal function. The left ventricle has no regional  wall motion abnormalities. There is mild left ventricular hypertrophy.  Left ventricular diastolic parameters  are indeterminate.   2. Right ventricular systolic function is normal. The right ventricular  size is normal. Tricuspid regurgitation signal is inadequate for assessing  PA pressure.   3. The mitral valve is normal in structure. No evidence of mitral valve  regurgitation. No evidence of mitral stenosis.   4. The aortic valve was not  well visualized. Aortic valve regurgitation  is trivial. No aortic stenosis is present.   5. The inferior vena cava is normal in size with greater than 50%  respiratory variability, suggesting right atrial pressure of 3 mmHg.   06/01/2021  PVL Left LE Reflux ([place]) Final Interpretation  Right   Left  There is no evidence of chronic venous insufficiency., There is no evidence of obstruction proximal to the inguinal ligament or in the common femoral vein. There is no evidence of DVT in the lower extremity.   09/26/2019  CT Chest FINDINGS: Cardiovascular: Normal heart size. No pericardial effusion. Coronary artery calcifications.   Mediastinum/Nodes: No enlarged mediastinal or axillary lymph nodes. Thyroid gland, trachea, and esophagus demonstrate no significant findings.   Lungs/Pleura: Central airways are patent. Mild chronic bronchial wall thickening. No consolidation. Minimal punctate calcified granulomas noted. No new nodule or mass.   Upper Abdomen: No acute abnormality.   Musculoskeletal: No acute abnormality.   IMPRESSION: No new lung nodule or mass.  08/06/2015  CT Coronary Calcium Score FINDINGS: Non-cardiac: See separate report from [facility]. Ascending Aorta:  Normal caliber. Pericardium: Normal. Coronary arteries:  Normal origin. IMPRESSION: Coronary calcium score of 239. This was 70 percentile for age and sex matched control. All calcium located in proximal to mid LAD.    EKG:   EKG is personally reviewed.  03/30/2022: EKG was not ordered.    Recent Labs: 05/27/2021: ALT 26 02/10/2022: BUN 10; Creatinine, Ser 0.87; Hemoglobin 13.2; Magnesium 2.1; Platelets 183; Potassium 4.5; Sodium 132; TSH 3.070   Recent Lipid Panel    Component Value Date/Time   CHOL 160 05/27/2021 0840   TRIG 117.0 05/27/2021 0840   HDL 76.60 05/27/2021 0840   CHOLHDL 2 05/27/2021 0840   VLDL 23.4 05/27/2021 0840   LDLCALC 60 05/27/2021 0840   LDLDIRECT 81.0  10/16/2010 0939    Physical Exam:    VS:  BP 128/70   Pulse 65   Ht 5\' 6"  (1.676 m)   Wt 204 lb 9.6 oz (92.8 kg)   SpO2 99%   BMI 33.02 kg/m     Wt Readings from Last 3 Encounters:  03/30/22 204 lb 9.6 oz (92.8 kg)  02/10/22  202 lb 12.8 oz (92 kg)  06/26/21 200 lb (90.7 kg)     GEN: Well nourished, well developed in no acute distress HEENT: Normal NECK: No JVD; No carotid bruits LYMPHATICS: No lymphadenopathy CARDIAC: RRR, no murmurs, rubs, gallops RESPIRATORY:  Clear to auscultation without rales, wheezing or rhonchi  ABDOMEN: Soft, non-tender, non-distended MUSCULOSKELETAL:  No edema; No deformity  SKIN: Warm and dry NEUROLOGIC:  Alert and oriented x 3 PSYCHIATRIC:  Normal affect       ASSESSMENT:    1. Atrial fibrillation with RVR ([place])   2. Obstructive sleep apnea   3. Atherosclerosis of native coronary artery of native heart without angina pectoris   4. Essential hypertension    PLAN:    In order of problems listed above:  #Symptomatic paroxysmal atrial fibrillation Highly symptomatic salvos of atrial fibrillation.  These episodes have decreased in frequency on metoprolol but still having episodes as recently as Sunday that last greater than 1 hour.  I discussed treatment strategies for her atrial fibrillation including antiarrhythmic drug therapy (flecainide, dronedarone, dofetilide specifically discussed at this visit) and catheter ablation.  I discussed the catheter ablation procedure in detail with the patient including the risks, recovery and likelihood of success.    She has an upcoming appointment at [facility] with Dr. Betsy Pries to discuss treatment strategies as well.  She will let us know how she would like to proceed.  Depending on where she decides to have a catheter ablation performed, would favor a CT scan protocoled to assess not only the left atrium and pulmonary veins but also the coronaries to rule out obstructive coronary artery  disease.  She is currently on bisoprolol, diltiazem and metoprolol tartrate as needed.  For now, continue that regimen.   Risk, benefits, and alternatives to EP study and radiofrequency ablation for afib were also discussed in detail today. These risks include but are not limited to stroke, bleeding, vascular damage, tamponade, perforation, damage to the esophagus, lungs, and other structures, pulmonary vein stenosis, worsening renal function, and death. The patient understands these risk and will call if she wishes to schedule.Carto, ICE, anesthesia are requested for the procedure.    #Suspected sleep disordered breathing We also discussed her snoring.  She had a prior sleep study for 5 years ago.  She says it was negative at that time but she has gained weight since then and her snoring has worsened according to her husband.  I think it would be worthwhile to recheck sleep apnea with a WatchPAT study.  She will let us know how she would like to proceed after meeting with [facility].   Total time spent with patient today 60 minutes. This includes reviewing records, evaluating the patient and coordinating care.  Medication Adjustments/Labs and Tests Ordered: Current medicines are reviewed at length with the patient today.  Concerns regarding medicines are outlined above.  No orders of the defined types were placed in this encounter.  No orders of the defined types were placed in this encounter.   I,Mathew Stumpf,acting as a Education administrator for Vickie Epley, MD.,have documented all relevant documentation on the behalf of Vickie Epley, MD,as directed by  Vickie Epley, MD while in the presence of Vickie Epley, MD.  I, Vickie Epley, MD, have reviewed all documentation for this visit. The documentation on 03/30/22 for the exam, diagnosis, procedures, and orders are all accurate and complete.   Signed, Hilton Cork. Quentin Ore, MD, [facility], [facility] 03/30/2022 10:01 AM  Electrophysiology [facility]

## 2022-04-09 ENCOUNTER — Other Ambulatory Visit: Payer: Self-pay | Admitting: Family Medicine

## 2022-04-12 ENCOUNTER — Ambulatory Visit: Payer: BC Managed Care – PPO | Admitting: Cardiology

## 2022-04-13 ENCOUNTER — Encounter: Payer: Self-pay | Admitting: Family Medicine

## 2022-04-15 ENCOUNTER — Ambulatory Visit: Payer: BC Managed Care – PPO | Admitting: Family Medicine

## 2022-04-15 ENCOUNTER — Encounter: Payer: Self-pay | Admitting: Family Medicine

## 2022-04-15 VITALS — BP 116/68 | HR 77 | Temp 98.4°F | Resp 16 | Ht 66.0 in | Wt 202.4 lb

## 2022-04-15 DIAGNOSIS — N898 Other specified noninflammatory disorders of vagina: Secondary | ICD-10-CM | POA: Diagnosis not present

## 2022-04-15 MED ORDER — FLUCONAZOLE 150 MG PO TABS: 150.0000 mg | ORAL_TABLET | Freq: Once | ORAL | 0 refills | Status: AC

## 2022-04-15 NOTE — Patient Instructions (Signed)
Apply topical OTC cortisone 10 cream ( hydrocortisone 1 %) to itching external area twice daily.  We will call with wet prep results.  Go ahead and take 1 dose of yeast infection medication to  cover for that possibility.

## 2022-04-15 NOTE — Assessment & Plan Note (Signed)
Acute,   Will send wet prep. Most likely cause is allergic contact derm from chemical vs candidal  Infection.  Will treat with topical steroid cream low dose for itching. Will treat empirically with fluconazole 150 mg x 1 to cover for yeast involvement.

## 2022-04-15 NOTE — Progress Notes (Signed)
Patient ID: Amanda Estrada, female    DOB: 1961/04/29, 61 y.o.   MRN: [id number]  This visit was conducted in person.  BP 116/68   Pulse 77   Temp 98.4 F (36.9 C)   Resp 16   Ht 5\' 6"  (1.676 m)   Wt 202 lb 6 oz (91.8 kg)   SpO2 98%   BMI 32.66 kg/m    CC:  Chief Complaint  Patient presents with   Vaginal Itching    X 1 week swollen  gotten better. Been taking oatmeal bath and using A&D.    Subjective:   HPI: Amanda Estrada is a 61 y.o. female presenting on 04/15/2022 for Vaginal Itching (X 1 week swollen  gotten better. Been taking oatmeal bath and using A&D.)  New onset external vaginal itching and swelling of labia... Has been treating with A and D and oatmeal baths. Had red rash on  lower legs.. itchy. Rash on legs resolved. Has had  improvement in vaginal symptoms, but not resolved.  No discharge.   She  is not sexually active.   She has had tub cleaned recently, no other new exposures.     Has upcoming ablation planned for atrial fibrillation. Relevant past medical, surgical, family and social history reviewed and updated as indicated. Interim medical history since our last visit reviewed. Allergies and medications reviewed and updated. Outpatient Medications Prior to Visit  Medication Sig Dispense Refill   albuterol (VENTOLIN HFA) 108 (90 Base) MCG/ACT inhaler Inhale 2 puffs into the lungs every 4 (four) hours as needed for wheezing or shortness of breath. 8 g 2   apixaban (ELIQUIS) 5 MG TABS tablet Take 1 tablet (5 mg total) by mouth 2 (two) times daily. 60 tablet 11   beclomethasone (QVAR) 80 MCG/ACT inhaler Inhale 1 puff into the lungs in the morning and at bedtime. 1 each 12   bisoprolol (ZEBETA) 10 MG tablet Take 1 tablet (10 mg total) by mouth daily. 90 tablet 3   cloNIDine (CATAPRES) 0.1 MG tablet TAKE 1 TABLET BY MOUTH EVERYDAY AT BEDTIME 90 tablet 3   dicyclomine (BENTYL) 10 MG capsule TAKE 1 CAPSULE (10 MG TOTAL) BY MOUTH 4 (FOUR) TIMES DAILY -  BEFORE MEALS AND AT BEDTIME. 360 capsule 0   diltiazem (CARDIZEM CD) 240 MG 24 hr capsule TAKE 1 CAPSULE BY MOUTH EVERY DAY 90 capsule 3   fluticasone (FLONASE) 50 MCG/ACT nasal spray PLACE 1 SPRAY INTO BOTH NOSTRILS DAILY AS NEEDED FOR ALLERGIES. 48 mL 3   hydrALAZINE (APRESOLINE) 25 MG tablet Take 1 tablet (25 mg total) by mouth 2 (two) times daily. 180 tablet 3   levocetirizine (XYZAL) 5 MG tablet Take 5 mg by mouth at bedtime.      metoprolol tartrate (LOPRESSOR) 25 MG tablet Take 1 tablet (25 mg total) by mouth 2 (two) times daily as needed (AS NEEDED FOR PALPITATIONS/ATRIAL FIBRILATION). 90 tablet 3   montelukast (SINGULAIR) 10 MG tablet TAKE 1 TABLET BY MOUTH EVERYDAY AT BEDTIME 90 tablet 0   omeprazole (PRILOSEC) 40 MG capsule TAKE 1 CAPSULE BY MOUTH TWICE A DAY 180 capsule 0   rosuvastatin (CRESTOR) 20 MG tablet TAKE 1 TABLET BY MOUTH EVERY DAY 90 tablet 3   spironolactone (ALDACTONE) 50 MG tablet Take 1 tablet (50 mg total) by mouth daily. 90 tablet 3   furosemide (LASIX) 20 MG tablet Take 1 tablet (20 mg total) by mouth daily as needed for edema (only as needed for swelling). [place]  tablet 6   No facility-administered medications prior to visit.     Per HPI unless specifically indicated in ROS section below Review of Systems  Constitutional:  Negative for fatigue and fever.  HENT:  Negative for ear pain.   Eyes:  Negative for pain.  Respiratory:  Negative for chest tightness and shortness of breath.   Cardiovascular:  Negative for chest pain, palpitations and leg swelling.  Gastrointestinal:  Negative for abdominal pain.  Genitourinary:  Negative for dysuria.   Objective:  BP 116/68   Pulse 77   Temp 98.4 F (36.9 C)   Resp 16   Ht 5\' 6"  (1.676 m)   Wt 202 lb 6 oz (91.8 kg)   SpO2 98%   BMI 32.66 kg/m   Wt Readings from Last 3 Encounters:  04/15/22 202 lb 6 oz (91.8 kg)  03/30/22 204 lb 9.6 oz (92.8 kg)  02/10/22 202 lb 12.8 oz (92 kg)      Physical  Exam Constitutional:      General: She is not in acute distress.    Appearance: Normal appearance. She is well-developed. She is not ill-appearing or toxic-appearing.  HENT:     Head: Normocephalic.     Right Ear: Hearing, tympanic membrane, ear canal and external ear normal. Tympanic membrane is not erythematous, retracted or bulging.     Left Ear: Hearing, tympanic membrane, ear canal and external ear normal. Tympanic membrane is not erythematous, retracted or bulging.     Nose: No mucosal edema or rhinorrhea.     Right Sinus: No maxillary sinus tenderness or frontal sinus tenderness.     Left Sinus: No maxillary sinus tenderness or frontal sinus tenderness.     Mouth/Throat:     Pharynx: Uvula midline.  Eyes:     General: Lids are normal. Lids are everted, no foreign bodies appreciated.     Conjunctiva/sclera: Conjunctivae normal.     Pupils: Pupils are equal, round, and reactive to light.  Neck:     Thyroid: No thyroid mass or thyromegaly.     Vascular: No carotid bruit.     Trachea: Trachea normal.  Cardiovascular:     Rate and Rhythm: Normal rate. Rhythm irregularly irregular.     Pulses: Normal pulses.     Heart sounds: Normal heart sounds, S1 normal and S2 normal. No murmur heard.    No friction rub. No gallop.  Pulmonary:     Effort: Pulmonary effort is normal. No tachypnea or respiratory distress.     Breath sounds: Normal breath sounds. No decreased breath sounds, wheezing, rhonchi or rales.  Abdominal:     General: Bowel sounds are normal.     Palpations: Abdomen is soft.     Tenderness: There is no abdominal tenderness.  Genitourinary:    Exam position: Supine.     Labia:        Right: Rash present.        Left: Rash present.   Musculoskeletal:     Cervical back: Normal range of motion and neck supple.  Skin:    General: Skin is warm and dry.     Findings: No rash.  Neurological:     Mental Status: She is alert.  Psychiatric:        Mood and Affect: Mood is  not anxious or depressed.        Speech: Speech normal.        Behavior: Behavior normal. Behavior is cooperative.  Thought Content: Thought content normal.        Judgment: Judgment normal.       Results for orders placed or performed in visit on 02/22/22  ECHOCARDIOGRAM COMPLETE  Result Value Ref Range   S' Lateral 2.40 cm     COVID 19 screen:  No recent travel or known exposure to COVID19 The patient denies respiratory symptoms of COVID 19 at this time. The importance of social distancing was discussed today.   Assessment and Plan    Problem List Items Addressed This Visit     Vaginal itching - Primary    Acute,   Will send wet prep. Most likely cause is allergic contact derm from chemical vs candidal  Infection.  Will treat with topical steroid cream low dose for itching. Will treat empirically with fluconazole 150 mg x 1 to cover for yeast involvement.           Eliezer Lofts, MD

## 2022-04-15 NOTE — Addendum Note (Signed)
Addended by: Loreen Freud on: 04/15/2022 10:35 AM   Modules accepted: Orders

## 2022-04-16 LAB — WET PREP BY MOLECULAR PROBE
Candida species: NOT DETECTED
Gardnerella vaginalis: NOT DETECTED
MICRO NUMBER:: 13500940
SPECIMEN QUALITY:: ADEQUATE
Trichomonas vaginosis: NOT DETECTED

## 2022-05-06 DIAGNOSIS — Z7901 Long term (current) use of anticoagulants: Secondary | ICD-10-CM | POA: Diagnosis not present

## 2022-05-06 DIAGNOSIS — I480 Paroxysmal atrial fibrillation: Secondary | ICD-10-CM | POA: Diagnosis not present

## 2022-05-06 DIAGNOSIS — Z6834 Body mass index (BMI) 34.0-34.9, adult: Secondary | ICD-10-CM | POA: Diagnosis not present

## 2022-05-06 DIAGNOSIS — R002 Palpitations: Secondary | ICD-10-CM | POA: Diagnosis not present

## 2022-05-26 DIAGNOSIS — E7800 Pure hypercholesterolemia, unspecified: Secondary | ICD-10-CM | POA: Diagnosis not present

## 2022-05-26 DIAGNOSIS — Z01818 Encounter for other preprocedural examination: Secondary | ICD-10-CM | POA: Diagnosis not present

## 2022-05-26 DIAGNOSIS — J4530 Mild persistent asthma, uncomplicated: Secondary | ICD-10-CM | POA: Diagnosis not present

## 2022-05-26 DIAGNOSIS — I480 Paroxysmal atrial fibrillation: Secondary | ICD-10-CM | POA: Diagnosis not present

## 2022-05-27 DIAGNOSIS — Z01810 Encounter for preprocedural cardiovascular examination: Secondary | ICD-10-CM | POA: Diagnosis not present

## 2022-05-27 DIAGNOSIS — R002 Palpitations: Secondary | ICD-10-CM | POA: Diagnosis not present

## 2022-05-27 DIAGNOSIS — Z8679 Personal history of other diseases of the circulatory system: Secondary | ICD-10-CM | POA: Diagnosis not present

## 2022-05-27 DIAGNOSIS — I480 Paroxysmal atrial fibrillation: Secondary | ICD-10-CM | POA: Diagnosis not present

## 2022-05-27 DIAGNOSIS — Z7901 Long term (current) use of anticoagulants: Secondary | ICD-10-CM | POA: Diagnosis not present

## 2022-05-27 DIAGNOSIS — J4530 Mild persistent asthma, uncomplicated: Secondary | ICD-10-CM | POA: Diagnosis not present

## 2022-05-31 DIAGNOSIS — R9439 Abnormal result of other cardiovascular function study: Secondary | ICD-10-CM | POA: Diagnosis not present

## 2022-05-31 DIAGNOSIS — K219 Gastro-esophageal reflux disease without esophagitis: Secondary | ICD-10-CM | POA: Diagnosis not present

## 2022-05-31 DIAGNOSIS — K589 Irritable bowel syndrome without diarrhea: Secondary | ICD-10-CM | POA: Diagnosis not present

## 2022-05-31 DIAGNOSIS — Z01810 Encounter for preprocedural cardiovascular examination: Secondary | ICD-10-CM | POA: Diagnosis not present

## 2022-05-31 DIAGNOSIS — J4530 Mild persistent asthma, uncomplicated: Secondary | ICD-10-CM | POA: Diagnosis not present

## 2022-05-31 DIAGNOSIS — I2510 Atherosclerotic heart disease of native coronary artery without angina pectoris: Secondary | ICD-10-CM | POA: Diagnosis not present

## 2022-05-31 DIAGNOSIS — I480 Paroxysmal atrial fibrillation: Secondary | ICD-10-CM | POA: Diagnosis not present

## 2022-05-31 DIAGNOSIS — J45909 Unspecified asthma, uncomplicated: Secondary | ICD-10-CM | POA: Diagnosis not present

## 2022-05-31 DIAGNOSIS — Z01818 Encounter for other preprocedural examination: Secondary | ICD-10-CM | POA: Diagnosis not present

## 2022-05-31 DIAGNOSIS — E785 Hyperlipidemia, unspecified: Secondary | ICD-10-CM | POA: Diagnosis not present

## 2022-05-31 DIAGNOSIS — Z7901 Long term (current) use of anticoagulants: Secondary | ICD-10-CM | POA: Diagnosis not present

## 2022-05-31 DIAGNOSIS — I25118 Atherosclerotic heart disease of native coronary artery with other forms of angina pectoris: Secondary | ICD-10-CM | POA: Diagnosis not present

## 2022-05-31 DIAGNOSIS — Z79899 Other long term (current) drug therapy: Secondary | ICD-10-CM | POA: Diagnosis not present

## 2022-05-31 DIAGNOSIS — I110 Hypertensive heart disease with heart failure: Secondary | ICD-10-CM | POA: Diagnosis not present

## 2022-05-31 DIAGNOSIS — Z880 Allergy status to penicillin: Secondary | ICD-10-CM | POA: Diagnosis not present

## 2022-05-31 DIAGNOSIS — I5032 Chronic diastolic (congestive) heart failure: Secondary | ICD-10-CM | POA: Diagnosis not present

## 2022-06-07 DIAGNOSIS — D62 Acute posthemorrhagic anemia: Secondary | ICD-10-CM | POA: Diagnosis not present

## 2022-06-07 DIAGNOSIS — I2510 Atherosclerotic heart disease of native coronary artery without angina pectoris: Secondary | ICD-10-CM | POA: Diagnosis not present

## 2022-06-07 DIAGNOSIS — E871 Hypo-osmolality and hyponatremia: Secondary | ICD-10-CM | POA: Diagnosis not present

## 2022-06-07 DIAGNOSIS — E785 Hyperlipidemia, unspecified: Secondary | ICD-10-CM | POA: Diagnosis not present

## 2022-06-07 DIAGNOSIS — R9431 Abnormal electrocardiogram [ECG] [EKG]: Secondary | ICD-10-CM | POA: Diagnosis not present

## 2022-06-07 DIAGNOSIS — I351 Nonrheumatic aortic (valve) insufficiency: Secondary | ICD-10-CM | POA: Diagnosis not present

## 2022-06-07 DIAGNOSIS — Z880 Allergy status to penicillin: Secondary | ICD-10-CM | POA: Diagnosis not present

## 2022-06-07 DIAGNOSIS — J9811 Atelectasis: Secondary | ICD-10-CM | POA: Diagnosis not present

## 2022-06-07 DIAGNOSIS — J939 Pneumothorax, unspecified: Secondary | ICD-10-CM | POA: Diagnosis not present

## 2022-06-07 DIAGNOSIS — R5381 Other malaise: Secondary | ICD-10-CM | POA: Diagnosis not present

## 2022-06-07 DIAGNOSIS — Z7982 Long term (current) use of aspirin: Secondary | ICD-10-CM | POA: Diagnosis not present

## 2022-06-07 DIAGNOSIS — J984 Other disorders of lung: Secondary | ICD-10-CM | POA: Diagnosis not present

## 2022-06-07 DIAGNOSIS — I480 Paroxysmal atrial fibrillation: Secondary | ICD-10-CM | POA: Diagnosis not present

## 2022-06-07 DIAGNOSIS — Z7901 Long term (current) use of anticoagulants: Secondary | ICD-10-CM | POA: Diagnosis not present

## 2022-06-07 DIAGNOSIS — I4510 Unspecified right bundle-branch block: Secondary | ICD-10-CM | POA: Diagnosis not present

## 2022-06-07 DIAGNOSIS — J90 Pleural effusion, not elsewhere classified: Secondary | ICD-10-CM | POA: Diagnosis not present

## 2022-06-07 DIAGNOSIS — R918 Other nonspecific abnormal finding of lung field: Secondary | ICD-10-CM | POA: Diagnosis not present

## 2022-06-07 DIAGNOSIS — Z452 Encounter for adjustment and management of vascular access device: Secondary | ICD-10-CM | POA: Diagnosis not present

## 2022-06-07 DIAGNOSIS — Z79899 Other long term (current) drug therapy: Secondary | ICD-10-CM | POA: Diagnosis not present

## 2022-06-07 DIAGNOSIS — Z951 Presence of aortocoronary bypass graft: Secondary | ICD-10-CM | POA: Diagnosis not present

## 2022-06-07 DIAGNOSIS — J948 Other specified pleural conditions: Secondary | ICD-10-CM | POA: Diagnosis not present

## 2022-06-07 DIAGNOSIS — Z4682 Encounter for fitting and adjustment of non-vascular catheter: Secondary | ICD-10-CM | POA: Diagnosis not present

## 2022-06-07 DIAGNOSIS — K219 Gastro-esophageal reflux disease without esophagitis: Secondary | ICD-10-CM | POA: Diagnosis not present

## 2022-06-07 DIAGNOSIS — J45909 Unspecified asthma, uncomplicated: Secondary | ICD-10-CM | POA: Diagnosis not present

## 2022-06-07 DIAGNOSIS — I10 Essential (primary) hypertension: Secondary | ICD-10-CM | POA: Diagnosis not present

## 2022-06-28 ENCOUNTER — Telehealth (HOSPITAL_COMMUNITY): Payer: Self-pay | Admitting: *Deleted

## 2022-06-28 NOTE — Telephone Encounter (Signed)
Received referral from Darla Lesches PA with Dr. Jimmye Norman at [facility] for this pt to participate in Cardiac rehab.  Reviewed medical history. Noted that pt lives in [place].  Will contact pt for location preference [place] vs [facility]. Message left for pt.  Await return call. Cherre Huger, BSN Cardiac and Training and development officer

## 2022-06-29 DIAGNOSIS — J811 Chronic pulmonary edema: Secondary | ICD-10-CM | POA: Diagnosis not present

## 2022-06-29 DIAGNOSIS — J90 Pleural effusion, not elsewhere classified: Secondary | ICD-10-CM | POA: Diagnosis not present

## 2022-06-29 DIAGNOSIS — Z4802 Encounter for removal of sutures: Secondary | ICD-10-CM | POA: Diagnosis not present

## 2022-06-29 DIAGNOSIS — Z48812 Encounter for surgical aftercare following surgery on the circulatory system: Secondary | ICD-10-CM | POA: Diagnosis not present

## 2022-06-29 DIAGNOSIS — Z9889 Other specified postprocedural states: Secondary | ICD-10-CM | POA: Diagnosis not present

## 2022-06-29 DIAGNOSIS — R9431 Abnormal electrocardiogram [ECG] [EKG]: Secondary | ICD-10-CM | POA: Diagnosis not present

## 2022-06-29 DIAGNOSIS — I452 Bifascicular block: Secondary | ICD-10-CM | POA: Diagnosis not present

## 2022-06-29 DIAGNOSIS — Z951 Presence of aortocoronary bypass graft: Secondary | ICD-10-CM | POA: Diagnosis not present

## 2022-06-29 DIAGNOSIS — J9811 Atelectasis: Secondary | ICD-10-CM | POA: Diagnosis not present

## 2022-07-06 ENCOUNTER — Other Ambulatory Visit: Payer: Self-pay | Admitting: Family Medicine

## 2022-07-06 NOTE — Telephone Encounter (Signed)
Please schedule CPE with fasting labs prior with Dr. Diona Browner.

## 2022-07-06 NOTE — Telephone Encounter (Signed)
Patient just had open heart surgery and currently is still recovering from surgery. Stated she is also seeing a PT.

## 2022-07-15 DIAGNOSIS — E7800 Pure hypercholesterolemia, unspecified: Secondary | ICD-10-CM | POA: Diagnosis not present

## 2022-07-15 DIAGNOSIS — Z951 Presence of aortocoronary bypass graft: Secondary | ICD-10-CM | POA: Diagnosis not present

## 2022-07-15 DIAGNOSIS — Z6832 Body mass index (BMI) 32.0-32.9, adult: Secondary | ICD-10-CM | POA: Diagnosis not present

## 2022-07-15 DIAGNOSIS — I2510 Atherosclerotic heart disease of native coronary artery without angina pectoris: Secondary | ICD-10-CM | POA: Diagnosis not present

## 2022-07-22 ENCOUNTER — Other Ambulatory Visit: Payer: Self-pay | Admitting: Gastroenterology

## 2022-08-02 ENCOUNTER — Other Ambulatory Visit: Payer: Self-pay

## 2022-08-02 ENCOUNTER — Encounter: Payer: BC Managed Care – PPO | Attending: Cardiovascular Disease

## 2022-08-02 DIAGNOSIS — Z951 Presence of aortocoronary bypass graft: Secondary | ICD-10-CM

## 2022-08-02 NOTE — Progress Notes (Signed)
Virtual Visit completed. Patient informed on EP and RD appointment and 6 Minute walk test. Patient also informed of patient health questionnaires on My Chart. Patient Verbalizes understanding. Visit diagnosis can be found in [facility] 06/07/2022.

## 2022-08-09 ENCOUNTER — Encounter: Payer: BC Managed Care – PPO | Attending: Cardiovascular Disease | Admitting: *Deleted

## 2022-08-09 VITALS — Ht 65.5 in | Wt 194.8 lb

## 2022-08-09 DIAGNOSIS — Z951 Presence of aortocoronary bypass graft: Secondary | ICD-10-CM | POA: Insufficient documentation

## 2022-08-09 NOTE — Progress Notes (Signed)
Cardiac Individual Treatment Plan  Patient Details  Name: Amanda Estrada MRN: [id number] Date of Birth: [birth year] Referring Provider:   Flowsheet Row Cardiac Rehab from 08/09/2022 in [facility] Cardiac and Pulmonary Rehab  Referring Provider Pleas Patricia MD       Initial Encounter Date:  Flowsheet Row Cardiac Rehab from 08/09/2022 in [facility] Cardiac and Pulmonary Rehab  Date 08/09/22       Visit Diagnosis: S/P CABG x 3  Patient's Home Medications on Admission:  Current Outpatient Medications:    albuterol (VENTOLIN HFA) 108 (90 Base) MCG/ACT inhaler, Inhale 2 puffs into the lungs every 4 (four) hours as needed for wheezing or shortness of breath. (Patient not taking: Reported on 08/02/2022), Disp: 8 g, Rfl: 2   amiodarone (PACERONE) 200 MG tablet, Take by mouth., Disp: , Rfl:    apixaban (ELIQUIS) 5 MG TABS tablet, Take 1 tablet (5 mg total) by mouth 2 (two) times daily., Disp: 60 tablet, Rfl: 11   apixaban (ELIQUIS) 5 MG TABS tablet, Take by mouth., Disp: , Rfl:    aspirin EC 81 MG tablet, Take by mouth., Disp: , Rfl:    beclomethasone (QVAR) 80 MCG/ACT inhaler, Inhale 1 puff into the lungs in the morning and at bedtime. (Patient not taking: Reported on 08/02/2022), Disp: 1 each, Rfl: 12   bisoprolol (ZEBETA) 10 MG tablet, Take 1 tablet (10 mg total) by mouth daily., Disp: 90 tablet, Rfl: 3   carvedilol (COREG) 12.5 MG tablet, Take by mouth., Disp: , Rfl:    cloNIDine (CATAPRES) 0.1 MG tablet, TAKE 1 TABLET BY MOUTH EVERYDAY AT BEDTIME, Disp: 90 tablet, Rfl: 3   dicyclomine (BENTYL) 10 MG capsule, TAKE 1 CAPSULE (10 MG TOTAL) BY MOUTH 4 (FOUR) TIMES DAILY - BEFORE MEALS AND AT BEDTIME. (Patient not taking: Reported on 08/02/2022), Disp: 360 capsule, Rfl: 0   diltiazem (CARDIZEM CD) 240 MG 24 hr capsule, TAKE 1 CAPSULE BY MOUTH EVERY DAY, Disp: 90 capsule, Rfl: 3   fluticasone (FLONASE) 50 MCG/ACT nasal spray, PLACE 1 SPRAY INTO BOTH NOSTRILS DAILY AS NEEDED FOR ALLERGIES., Disp: 48 mL,  Rfl: 3   furosemide (LASIX) 20 MG tablet, Take 1 tablet (20 mg total) by mouth daily as needed for edema (only as needed for swelling)., Disp: 30 tablet, Rfl: 6   hydrALAZINE (APRESOLINE) 25 MG tablet, Take 1 tablet (25 mg total) by mouth 2 (two) times daily. (Patient not taking: Reported on 08/02/2022), Disp: 180 tablet, Rfl: 3   levocetirizine (XYZAL) 5 MG tablet, Take 5 mg by mouth at bedtime. , Disp: , Rfl:    levocetirizine (XYZAL) 5 MG tablet, Take by mouth., Disp: , Rfl:    lisinopril (ZESTRIL) 20 MG tablet, Take by mouth., Disp: , Rfl:    metoprolol tartrate (LOPRESSOR) 25 MG tablet, Take 1 tablet (25 mg total) by mouth 2 (two) times daily as needed (AS NEEDED FOR PALPITATIONS/ATRIAL FIBRILATION). (Patient not taking: Reported on 08/02/2022), Disp: 90 tablet, Rfl: 3   montelukast (SINGULAIR) 10 MG tablet, TAKE 1 TABLET BY MOUTH EVERYDAY AT BEDTIME, Disp: 90 tablet, Rfl: 0   montelukast (SINGULAIR) 10 MG tablet, Take 1 tablet by mouth at bedtime., Disp: , Rfl:    omeprazole (PRILOSEC) 40 MG capsule, TAKE 1 CAPSULE BY MOUTH TWICE A DAY, Disp: 180 capsule, Rfl: 0   pantoprazole (PROTONIX) 40 MG tablet, , Disp: , Rfl:    rosuvastatin (CRESTOR) 20 MG tablet, TAKE 1 TABLET BY MOUTH EVERY DAY, Disp: 90 tablet, Rfl: 3   spironolactone (  ALDACTONE) 50 MG tablet, Take 1 tablet (50 mg total) by mouth daily. (Patient not taking: Reported on 08/02/2022), Disp: 90 tablet, Rfl: 3  Past Medical History: Past Medical History:  Diagnosis Date   Allergy    Anxiety    patient denies   Asthma    Cataract    Dysplastic nevus 08/28/2018   R paraspinal mid to upper back - severe, excision 11/21/2018   Dysplastic nevus 08/28/2018   L lat breast sup - moderate   Dysplastic nevus 07/02/2019   R mid to low back 4.0 cm lat to spine - moderate   Dysplastic nevus 07/02/2019   R inframammary - moderate   GERD (gastroesophageal reflux disease)    Glaucoma    History of echocardiogram    a. 11/2015: EF 55-60%, no  RWMA, nl LV diastolic fxn, PASP nl   Hypertension    Obesity    PAF (paroxysmal atrial fibrillation) ([place])    a. CHADS2VASc = 2 (HTN, sex category)   Sinusitis 2011   Recurrent per Dr. Tami Ribas with [facility]    Tobacco Use: Social History   Tobacco Use  Smoking Status Never  Smokeless Tobacco Never    Labs: Review Flowsheet  More data exists      Latest Ref Rng & Units 08/04/2017 10/14/2017 12/01/2018 02/18/2020 05/27/2021  Labs for ITP Cardiac and Pulmonary Rehab  Cholestrol 0 - 200 mg/dL 168  155  158  176  160   LDL (calc) 0 - 99 mg/dL 56  48  43  62  60   HDL-C >39.00 mg/dL 101.50  95.40  104.90  94.50  76.60   Trlycerides 0.0 - 149.0 mg/dL 51.0  62.0  51.0  96.0  117.0      Exercise Target Goals: Exercise Program Goal: Individual exercise prescription set using results from initial 6 min walk test and THRR while considering  patient's activity barriers and safety.   Exercise Prescription Goal: Initial exercise prescription builds to 30-45 minutes a day of aerobic activity, 2-3 days per week.  Home exercise guidelines will be given to patient during program as part of exercise prescription that the participant will acknowledge.   Education: Aerobic Exercise: - Group verbal and visual presentation on the components of exercise prescription. Introduces F.I.T.T principle from ACSM for exercise prescriptions.  Reviews F.I.T.T. principles of aerobic exercise including progression. Written material given at graduation.   Education: Resistance Exercise: - Group verbal and visual presentation on the components of exercise prescription. Introduces F.I.T.T principle from ACSM for exercise prescriptions  Reviews F.I.T.T. principles of resistance exercise including progression. Written material given at graduation.    Education: Exercise & Equipment Safety: - Individual verbal instruction and demonstration of equipment use and safety with use of the equipment. Flowsheet Row  Cardiac Rehab from 08/09/2022 in [facility] Cardiac and Pulmonary Rehab  Date 08/02/22  Educator [facility]  Instruction Review Code 1- Verbalizes Understanding       Education: Exercise Physiology & General Exercise Guidelines: - Group verbal and written instruction with models to review the exercise physiology of the cardiovascular system and associated critical values. Provides general exercise guidelines with specific guidelines to those with heart or lung disease.    Education: Flexibility, Balance, Mind/Body Relaxation: - Group verbal and visual presentation with interactive activity on the components of exercise prescription. Introduces F.I.T.T principle from ACSM for exercise prescriptions. Reviews F.I.T.T. principles of flexibility and balance exercise training including progression. Also discusses the mind body connection.  Reviews various  relaxation techniques to help reduce and manage stress (i.e. Deep breathing, progressive muscle relaxation, and visualization). Balance handout provided to take home. Written material given at graduation.   Activity Barriers & Risk Stratification:  Activity Barriers & Cardiac Risk Stratification - 08/09/22 1155       Activity Barriers & Cardiac Risk Stratification   Activity Barriers Deconditioning;Muscular Weakness;Balance Concerns    Cardiac Risk Stratification High             6 Minute Walk:  6 Minute Walk     Row Name 08/09/22 1154         6 Minute Walk   Phase Initial     Distance 1200 feet     Walk Time 6 minutes     # of Rest Breaks 0     MPH 2.27     METS 3.21     RPE 11     Perceived Dyspnea  1     VO2 Peak 11.24     Symptoms Yes (comment)     Comments SOB     Resting HR 74 bpm     Resting BP 136/74     Resting Oxygen Saturation  100 %     Exercise Oxygen Saturation  during 6 min walk 100 %     Max Ex. HR 96 bpm     Max Ex. BP 174/82     2 Minute Post BP 142/70              Oxygen Initial Assessment:   Oxygen  Re-Evaluation:   Oxygen Discharge (Final Oxygen Re-Evaluation):   Initial Exercise Prescription:  Initial Exercise Prescription - 08/09/22 1100       Date of Initial Exercise RX and Referring Provider   Date 08/09/22    Referring Provider Pleas Patricia MD      Oxygen   Maintain Oxygen Saturation 88% or higher      Treadmill   MPH 2.2    Grade 1    Minutes 15    METs 2.99      Recumbant Elliptical   Level 1    RPM 50    Minutes 15    METs 3      REL-XR   Level 3    Speed 50    Minutes 15    METs 3      Prescription Details   Frequency (times per week) 3    Duration Progress to 30 minutes of continuous aerobic without signs/symptoms of physical distress      Intensity   THRR 40-80% of Max Heartrate 109-142    Ratings of Perceived Exertion 11-13    Perceived Dyspnea 0-4      Progression   Progression Continue to progress workloads to maintain intensity without signs/symptoms of physical distress.      Resistance Training   Training Prescription Yes    Weight 4 lb    Reps 10-15             Perform Capillary Blood Glucose checks as needed.  Exercise Prescription Changes:   Exercise Prescription Changes     Row Name 08/09/22 1100             Response to Exercise   Blood Pressure (Admit) 136/74       Blood Pressure (Exercise) 174/82       Blood Pressure (Exit) 142/70       Heart Rate (Admit) 75 bpm       Heart  Rate (Exercise) 96 bpm       Heart Rate (Exit) 78 bpm       Oxygen Saturation (Admit) 100 %       Oxygen Saturation (Exercise) 100 %       Rating of Perceived Exertion (Exercise) 11       Perceived Dyspnea (Exercise) 1       Symptoms SOB       Comments walk test results                Exercise Comments:   Exercise Goals and Review:   Exercise Goals     Row Name 08/09/22 1200             Exercise Goals   Increase Physical Activity Yes       Intervention Provide advice, education, support and counseling about  physical activity/exercise needs.;Develop an individualized exercise prescription for aerobic and resistive training based on initial evaluation findings, risk stratification, comorbidities and participant's personal goals.       Expected Outcomes Short Term: Attend rehab on a regular basis to increase amount of physical activity.;Long Term: Add in home exercise to make exercise part of routine and to increase amount of physical activity.;Long Term: Exercising regularly at least 3-5 days a week.       Increase Strength and Stamina Yes       Intervention Provide advice, education, support and counseling about physical activity/exercise needs.;Develop an individualized exercise prescription for aerobic and resistive training based on initial evaluation findings, risk stratification, comorbidities and participant's personal goals.       Expected Outcomes Short Term: Increase workloads from initial exercise prescription for resistance, speed, and METs.;Short Term: Perform resistance training exercises routinely during rehab and add in resistance training at home;Long Term: Improve cardiorespiratory fitness, muscular endurance and strength as measured by increased METs and functional capacity (6MWT)       Able to understand and use rate of perceived exertion (RPE) scale Yes       Intervention Provide education and explanation on how to use RPE scale       Expected Outcomes Short Term: Able to use RPE daily in rehab to express subjective intensity level;Long Term:  Able to use RPE to guide intensity level when exercising independently       Able to understand and use Dyspnea scale Yes       Intervention Provide education and explanation on how to use Dyspnea scale       Expected Outcomes Short Term: Able to use Dyspnea scale daily in rehab to express subjective sense of shortness of breath during exertion;Long Term: Able to use Dyspnea scale to guide intensity level when exercising independently       Knowledge  and understanding of Target Heart Rate Range (THRR) Yes       Intervention Provide education and explanation of THRR including how the numbers were predicted and where they are located for reference       Expected Outcomes Short Term: Able to state/look up THRR;Short Term: Able to use daily as guideline for intensity in rehab;Long Term: Able to use THRR to govern intensity when exercising independently       Able to check pulse independently Yes       Intervention Provide education and demonstration on how to check pulse in carotid and radial arteries.;Review the importance of being able to check your own pulse for safety during independent exercise       Expected Outcomes  Short Term: Able to explain why pulse checking is important during independent exercise;Long Term: Able to check pulse independently and accurately       Understanding of Exercise Prescription Yes       Intervention Provide education, explanation, and written materials on patient's individual exercise prescription       Expected Outcomes Short Term: Able to explain program exercise prescription;Long Term: Able to explain home exercise prescription to exercise independently                Exercise Goals Re-Evaluation :   Discharge Exercise Prescription (Final Exercise Prescription Changes):  Exercise Prescription Changes - 08/09/22 1100       Response to Exercise   Blood Pressure (Admit) 136/74    Blood Pressure (Exercise) 174/82    Blood Pressure (Exit) 142/70    Heart Rate (Admit) 75 bpm    Heart Rate (Exercise) 96 bpm    Heart Rate (Exit) 78 bpm    Oxygen Saturation (Admit) 100 %    Oxygen Saturation (Exercise) 100 %    Rating of Perceived Exertion (Exercise) 11    Perceived Dyspnea (Exercise) 1    Symptoms SOB    Comments walk test results             Nutrition:  Target Goals: Understanding of nutrition guidelines, daily intake of sodium 1500mg , cholesterol 200mg , calories 30% from fat and 7% or  less from saturated fats, daily to have 5 or more servings of fruits and vegetables.  Education: All About Nutrition: -Group instruction provided by verbal, written material, interactive activities, discussions, models, and posters to present general guidelines for heart healthy nutrition including fat, fiber, MyPlate, the role of sodium in heart healthy nutrition, utilization of the nutrition label, and utilization of this knowledge for meal planning. Follow up email sent as well. Written material given at graduation. Flowsheet Row Cardiac Rehab from 08/09/2022 in [facility] Cardiac and Pulmonary Rehab  Education need identified 08/09/22       Biometrics:  Pre Biometrics - 08/09/22 1200       Pre Biometrics   Height 5' 5.5" (1.664 m)    Weight 194 lb 12.8 oz (88.4 kg)    BMI (Calculated) 31.91    Single Leg Stand 2.7 seconds              Nutrition Therapy Plan and Nutrition Goals:  Nutrition Therapy & Goals - 08/09/22 1123       Nutrition Therapy   Diet Heart healthy, low Na    Drug/Food Interactions Statins/Certain Fruits    Protein (specify units) 70-75g    Fiber 25 grams    Whole Grain Foods 3 servings    Saturated Fats 14 max. grams    Fruits and Vegetables 8 servings/day      Personal Nutrition Goals   Nutrition Goal ST: LT:    Comments 61 y.o. F admitted to cardiac rehab s/p CABG x3. PMHx includes CAD, hypercholesterolemia, HTN, paroxysmal a.fib. Relevant medications includes fluticasone, omeprazole, crestor. She has cut back on her portion sizes. L: leftovers (ex: chicken salad and some pasta salad) D: example steak tonight with potato and salad. Drinks: gatorade zero, sparkling, water. Maci reports using olive oil and avocado oils with cooking, however, she will like to use butter, milk, and cheese with cooking - discussed some options to lower amount of high fat dairy with cooking and encouraged to choose recipes with lower amounts of dairy at least 2-3 times per week.  She reports  that her lab values for sodium have been low and her MD recommended to include sodium in her diet. She checks her BP regularly and it has been normal to high; sugested limiting Na to 2300mg  at the higher end to balance lower sodium numbers and elevated BP - informed her that we may need to make adjustments to this recommendation depeing on her lab and BP numbers moving foward. She reports liking pasta - encouaged her that she can still have pasta, but add lean protein and non-starchy vegetables so it is more balances and CHO do not take up most of the plate. Since she has decreased her portion sizes as well as starchy vegetables, discussed how heart healthy changes like increased non-starchy vegetables as a portion of the plate may cause her to be hungrier soon after eating and that healthy snacking may help to control hunger. Discussed heart healthy eating and MyPlate. Makinna feels that she has the tools she needs to achieve her goals with no major barriers reported.      Intervention Plan   Intervention Prescribe, educate and counsel regarding individualized specific dietary modifications aiming towards targeted core components such as weight, hypertension, lipid management, diabetes, heart failure and other comorbidities.;Nutrition handout(s) given to patient.    Expected Outcomes Short Term Goal: Understand basic principles of dietary content, such as calories, fat, sodium, cholesterol and nutrients.;Short Term Goal: A plan has been developed with personal nutrition goals set during dietitian appointment.;Long Term Goal: Adherence to prescribed nutrition plan.             Nutrition Assessments:  MEDIFICTS Score Key: ?70 Need to make dietary changes  40-70 Heart Healthy Diet ? 40 Therapeutic Level Cholesterol Diet  Flowsheet Row Cardiac Rehab from 08/09/2022 in [facility] Cardiac and Pulmonary Rehab  Picture Your Plate Total Score on Admission 60      Picture Your Plate Scores: <41  Unhealthy dietary pattern with much room for improvement. 41-50 Dietary pattern unlikely to meet recommendations for good health and room for improvement. 51-60 More healthful dietary pattern, with some room for improvement.  >60 Healthy dietary pattern, although there may be some specific behaviors that could be improved.    Nutrition Goals Re-Evaluation:   Nutrition Goals Discharge (Final Nutrition Goals Re-Evaluation):   Psychosocial: Target Goals: Acknowledge presence or absence of significant depression and/or stress, maximize coping skills, provide positive support system. Participant is able to verbalize types and ability to use techniques and skills needed for reducing stress and depression.   Education: Stress, Anxiety, and Depression - Group verbal and visual presentation to define topics covered.  Reviews how body is impacted by stress, anxiety, and depression.  Also discusses healthy ways to reduce stress and to treat/manage anxiety and depression.  Written material given at graduation. Flowsheet Row Cardiac Rehab from 08/09/2022 in [facility] Cardiac and Pulmonary Rehab  Education need identified 08/09/22       Education: Sleep Hygiene -Provides group verbal and written instruction about how sleep can affect your health.  Define sleep hygiene, discuss sleep cycles and impact of sleep habits. Review good sleep hygiene tips.    Initial Review & Psychosocial Screening:  Initial Psych Review & Screening - 08/02/22 1125       Initial Review   Current issues with None Identified      Family Dynamics   Good Support System? Yes    Comments Tayelor can look to her spouse family and freinds for support. She feels good about her health and  is ready to make changes.      Barriers   Psychosocial barriers to participate in program The patient should benefit from training in stress management and relaxation.;There are no identifiable barriers or psychosocial needs.      Screening  Interventions   Interventions Encouraged to exercise;To provide support and resources with identified psychosocial needs;Provide feedback about the scores to participant    Expected Outcomes Short Term goal: Utilizing psychosocial counselor, staff and physician to assist with identification of specific Stressors or current issues interfering with healing process. Setting desired goal for each stressor or current issue identified.;Long Term Goal: Stressors or current issues are controlled or eliminated.;Short Term goal: Identification and review with participant of any Quality of Life or Depression concerns found by scoring the questionnaire.;Long Term goal: The participant improves quality of Life and PHQ9 Scores as seen by post scores and/or verbalization of changes             Quality of Life Scores:   Quality of Life - 08/09/22 1200       Quality of Life   Select Quality of Life      Quality of Life Scores   Health/Function Pre 26 %    Socioeconomic Pre 23.75 %    Psych/Spiritual Pre 30 %    Family Pre 28.8 %    GLOBAL Pre 26.69 %            Scores of 19 and below usually indicate a poorer quality of life in these areas.  A difference of  2-3 points is a clinically meaningful difference.  A difference of 2-3 points in the total score of the Quality of Life Index has been associated with significant improvement in overall quality of life, self-image, physical symptoms, and general health in studies assessing change in quality of life.  PHQ-9: Review Flowsheet  More data exists      08/09/2022 06/26/2021 02/21/2020 12/05/2018 09/26/2018  Depression screen PHQ 2/9  Decreased Interest 0 0 0 0 0  Down, Depressed, Hopeless 0 0 0 0 0  PHQ - 2 Score 0 0 0 0 0  Altered sleeping 0 - - - -  Tired, decreased energy 0 - - - -  Change in appetite 0 - - - -  Feeling bad or failure about yourself  0 - - - -  Trouble concentrating 0 - - - -  Moving slowly or fidgety/restless 0 - - - -   Suicidal thoughts 0 - - - -  PHQ-9 Score 0 - - - -  Difficult doing work/chores Not difficult at all - - - -   Interpretation of Total Score  Total Score Depression Severity:  1-4 = Minimal depression, 5-9 = Mild depression, 10-14 = Moderate depression, 15-19 = Moderately severe depression, 20-27 = Severe depression   Psychosocial Evaluation and Intervention:  Psychosocial Evaluation - 08/02/22 1127       Psychosocial Evaluation & Interventions   Interventions Encouraged to exercise with the program and follow exercise prescription;Relaxation education;Stress management education    Comments Kamilla can look to her spouse family and freinds for support. She feels good about her health and is ready to make changes.    Expected Outcomes Short: Start [facility] to help with mood. Long: Maintain a healthy mental state    Continue Psychosocial Services  Follow up required by staff             Psychosocial Re-Evaluation:   Psychosocial Discharge (Final Psychosocial Re-Evaluation):  Vocational Rehabilitation: Provide vocational rehab assistance to qualifying candidates.   Vocational Rehab Evaluation & Intervention:   Education: Education Goals: Education classes will be provided on a variety of topics geared toward better understanding of heart health and risk factor modification. Participant will state understanding/return demonstration of topics presented as noted by education test scores.  Learning Barriers/Preferences:  Learning Barriers/Preferences - 08/02/22 1124       Learning Barriers/Preferences   Learning Barriers None    Learning Preferences None             General Cardiac Education Topics:  AED/CPR: - Group verbal and written instruction with the use of models to demonstrate the basic use of the AED with the basic ABC's of resuscitation.   Anatomy and Cardiac Procedures: - Group verbal and visual presentation and models provide information about  basic cardiac anatomy and function. Reviews the testing methods done to diagnose heart disease and the outcomes of the test results. Describes the treatment choices: Medical Management, Angioplasty, or Coronary Bypass Surgery for treating various heart conditions including Myocardial Infarction, Angina, Valve Disease, and Cardiac Arrhythmias.  Written material given at graduation.   Medication Safety: - Group verbal and visual instruction to review commonly prescribed medications for heart and lung disease. Reviews the medication, class of the drug, and side effects. Includes the steps to properly store meds and maintain the prescription regimen.  Written material given at graduation.   Intimacy: - Group verbal instruction through game format to discuss how heart and lung disease can affect sexual intimacy. Written material given at graduation.. Flowsheet Row Cardiac Rehab from 08/09/2022 in [facility] Cardiac and Pulmonary Rehab  Education need identified 08/09/22       Know Your Numbers and Heart Failure: - Group verbal and visual instruction to discuss disease risk factors for cardiac and pulmonary disease and treatment options.  Reviews associated critical values for Overweight/Obesity, Hypertension, Cholesterol, and Diabetes.  Discusses basics of heart failure: signs/symptoms and treatments.  Introduces Heart Failure Zone chart for action plan for heart failure.  Written material given at graduation.   Infection Prevention: - Provides verbal and written material to individual with discussion of infection control including proper hand washing and proper equipment cleaning during exercise session. Flowsheet Row Cardiac Rehab from 08/09/2022 in [facility] Cardiac and Pulmonary Rehab  Date 08/02/22  Educator [facility]  Instruction Review Code 1- Verbalizes Understanding       Falls Prevention: - Provides verbal and written material to individual with discussion of falls prevention and safety. Flowsheet  Row Cardiac Rehab from 08/09/2022 in [facility] Cardiac and Pulmonary Rehab  Date 08/02/22  Educator [facility]  Instruction Review Code 1- Verbalizes Understanding       Other: -Provides group and verbal instruction on various topics (see comments)   Knowledge Questionnaire Score:  Knowledge Questionnaire Score - 08/09/22 1201       Knowledge Questionnaire Score   Pre Score 23/26             Core Components/Risk Factors/Patient Goals at Admission:  Personal Goals and Risk Factors at Admission - 08/09/22 1202       Core Components/Risk Factors/Patient Goals on Admission    Weight Management Yes;Weight Loss;Obesity    Intervention Weight Management: Develop a combined nutrition and exercise program designed to reach desired caloric intake, while maintaining appropriate intake of nutrient and fiber, sodium and fats, and appropriate energy expenditure required for the weight goal.;Weight Management/Obesity: Establish reasonable short term and long term weight goals.;Weight Management:  Provide education and appropriate resources to help participant work on and attain dietary goals.;Obesity: Provide education and appropriate resources to help participant work on and attain dietary goals.    Admit Weight 194 lb 12.8 oz (88.4 kg)    Goal Weight: Short Term 190 lb (86.2 kg)    Goal Weight: Long Term 185 lb (83.9 kg)    Expected Outcomes Short Term: Continue to assess and modify interventions until short term weight is achieved;Long Term: Adherence to nutrition and physical activity/exercise program aimed toward attainment of established weight goal;Weight Loss: Understanding of general recommendations for a balanced deficit meal plan, which promotes 1-2 lb weight loss per week and includes a negative energy balance of [phone] kcal/d;Understanding recommendations for meals to include 15-35% energy as protein, 25-35% energy from fat, 35-60% energy from carbohydrates, less than 200mg  of dietary  cholesterol, 20-35 gm of total fiber daily;Understanding of distribution of calorie intake throughout the day with the consumption of 4-5 meals/snacks    Hypertension Yes    Intervention Provide education on lifestyle modifcations including regular physical activity/exercise, weight management, moderate sodium restriction and increased consumption of fresh fruit, vegetables, and low fat dairy, alcohol moderation, and smoking cessation.;Monitor prescription use compliance.    Expected Outcomes Short Term: Continued assessment and intervention until BP is < 140/67mm HG in hypertensive participants. < 130/43mm HG in hypertensive participants with diabetes, heart failure or chronic kidney disease.;Long Term: Maintenance of blood pressure at goal levels.    Lipids Yes    Intervention Provide education and support for participant on nutrition & aerobic/resistive exercise along with prescribed medications to achieve LDL 70mg , HDL >40mg .    Expected Outcomes Short Term: Participant states understanding of desired cholesterol values and is compliant with medications prescribed. Participant is following exercise prescription and nutrition guidelines.;Long Term: Cholesterol controlled with medications as prescribed, with individualized exercise RX and with personalized nutrition plan. Value goals: LDL < 70mg , HDL > 40 mg.             Education:Diabetes - Individual verbal and written instruction to review signs/symptoms of diabetes, desired ranges of glucose level fasting, after meals and with exercise. Acknowledge that pre and post exercise glucose checks will be done for 3 sessions at entry of program.   Core Components/Risk Factors/Patient Goals Review:    Core Components/Risk Factors/Patient Goals at Discharge (Final Review):    ITP Comments:  ITP Comments     Row Name 08/02/22 1123 08/09/22 1154         ITP Comments Virtual Visit completed. Patient informed on EP and RD appointment and 6  Minute walk test. Patient also informed of patient health questionnaires on My Chart. Patient Verbalizes understanding. Visit diagnosis can be found in [facility] 06/07/[facility]. Completed 6MWT and gym orientation. Initial ITP created and sent for review to Dr. Emily Filbert, Medical Director.               Comments: Initial ITP

## 2022-08-09 NOTE — Patient Instructions (Addendum)
Patient Instructions  Patient Details  Name: Amanda Estrada MRN: [id number] Date of Birth: July 23, 1961 Referring Provider:  Mickel Crow*  Below are your personal goals for exercise, nutrition, and risk factors. Our goal is to help you stay on track towards obtaining and maintaining these goals. We will be discussing your progress on these goals with you throughout the program.  Initial Exercise Prescription:  Initial Exercise Prescription - 08/09/22 1100       Date of Initial Exercise RX and Referring Provider   Date 08/09/22    Referring Provider Pleas Patricia MD      Oxygen   Maintain Oxygen Saturation 88% or higher      Treadmill   MPH 2.2    Grade 1    Minutes 15    METs 2.99      Recumbant Elliptical   Level 1    RPM 50    Minutes 15    METs 3      REL-XR   Level 3    Speed 50    Minutes 15    METs 3      Prescription Details   Frequency (times per week) 3    Duration Progress to 30 minutes of continuous aerobic without signs/symptoms of physical distress      Intensity   THRR 40-80% of Max Heartrate 109-142    Ratings of Perceived Exertion 11-13    Perceived Dyspnea 0-4      Progression   Progression Continue to progress workloads to maintain intensity without signs/symptoms of physical distress.      Resistance Training   Training Prescription Yes    Weight 4 lb    Reps 10-15             Exercise Goals: Frequency: Be able to perform aerobic exercise two to three times per week in program working toward 2-5 days per week of home exercise.  Intensity: Work with a perceived exertion of 11 (fairly light) - 15 (hard) while following your exercise prescription.  We will make changes to your prescription with you as you progress through the program.   Duration: Be able to do 30 to 45 minutes of continuous aerobic exercise in addition to a 5 minute warm-up and a 5 minute cool-down routine.   Nutrition Goals: Your personal nutrition  goals will be established when you do your nutrition analysis with the dietician.  The following are general nutrition guidelines to follow: Cholesterol < 200mg /day Sodium < 1500mg /day Fiber: Women over 50 yrs - 21 grams per day  Personal Goals:  Personal Goals and Risk Factors at Admission - 08/09/22 1202       Core Components/Risk Factors/Patient Goals on Admission    Weight Management Yes;Weight Loss;Obesity    Intervention Weight Management: Develop a combined nutrition and exercise program designed to reach desired caloric intake, while maintaining appropriate intake of nutrient and fiber, sodium and fats, and appropriate energy expenditure required for the weight goal.;Weight Management/Obesity: Establish reasonable short term and long term weight goals.;Weight Management: Provide education and appropriate resources to help participant work on and attain dietary goals.;Obesity: Provide education and appropriate resources to help participant work on and attain dietary goals.    Admit Weight 194 lb 12.8 oz (88.4 kg)    Goal Weight: Short Term 190 lb (86.2 kg)    Goal Weight: Long Term 185 lb (83.9 kg)    Expected Outcomes Short Term: Continue to assess and modify interventions until short term weight  is achieved;Long Term: Adherence to nutrition and physical activity/exercise program aimed toward attainment of established weight goal;Weight Loss: Understanding of general recommendations for a balanced deficit meal plan, which promotes 1-2 lb weight loss per week and includes a negative energy balance of [phone] kcal/d;Understanding recommendations for meals to include 15-35% energy as protein, 25-35% energy from fat, 35-60% energy from carbohydrates, less than 200mg  of dietary cholesterol, 20-35 gm of total fiber daily;Understanding of distribution of calorie intake throughout the day with the consumption of 4-5 meals/snacks    Hypertension Yes    Intervention Provide education on lifestyle  modifcations including regular physical activity/exercise, weight management, moderate sodium restriction and increased consumption of fresh fruit, vegetables, and low fat dairy, alcohol moderation, and smoking cessation.;Monitor prescription use compliance.    Expected Outcomes Short Term: Continued assessment and intervention until BP is < 140/65mm HG in hypertensive participants. < 130/47mm HG in hypertensive participants with diabetes, heart failure or chronic kidney disease.;Long Term: Maintenance of blood pressure at goal levels.    Lipids Yes    Intervention Provide education and support for participant on nutrition & aerobic/resistive exercise along with prescribed medications to achieve LDL 70mg , HDL >40mg .    Expected Outcomes Short Term: Participant states understanding of desired cholesterol values and is compliant with medications prescribed. Participant is following exercise prescription and nutrition guidelines.;Long Term: Cholesterol controlled with medications as prescribed, with individualized exercise RX and with personalized nutrition plan. Value goals: LDL < 70mg , HDL > 40 mg.             Tobacco Use Initial Evaluation: Social History   Tobacco Use  Smoking Status Never  Smokeless Tobacco Never    Exercise Goals and Review:  Exercise Goals     Row Name 08/09/22 1200             Exercise Goals   Increase Physical Activity Yes       Intervention Provide advice, education, support and counseling about physical activity/exercise needs.;Develop an individualized exercise prescription for aerobic and resistive training based on initial evaluation findings, risk stratification, comorbidities and participant's personal goals.       Expected Outcomes Short Term: Attend rehab on a regular basis to increase amount of physical activity.;Long Term: Add in home exercise to make exercise part of routine and to increase amount of physical activity.;Long Term: Exercising  regularly at least 3-5 days a week.       Increase Strength and Stamina Yes       Intervention Provide advice, education, support and counseling about physical activity/exercise needs.;Develop an individualized exercise prescription for aerobic and resistive training based on initial evaluation findings, risk stratification, comorbidities and participant's personal goals.       Expected Outcomes Short Term: Increase workloads from initial exercise prescription for resistance, speed, and METs.;Short Term: Perform resistance training exercises routinely during rehab and add in resistance training at home;Long Term: Improve cardiorespiratory fitness, muscular endurance and strength as measured by increased METs and functional capacity (6MWT)       Able to understand and use rate of perceived exertion (RPE) scale Yes       Intervention Provide education and explanation on how to use RPE scale       Expected Outcomes Short Term: Able to use RPE daily in rehab to express subjective intensity level;Long Term:  Able to use RPE to guide intensity level when exercising independently       Able to understand and use Dyspnea scale Yes  Intervention Provide education and explanation on how to use Dyspnea scale       Expected Outcomes Short Term: Able to use Dyspnea scale daily in rehab to express subjective sense of shortness of breath during exertion;Long Term: Able to use Dyspnea scale to guide intensity level when exercising independently       Knowledge and understanding of Target Heart Rate Range (THRR) Yes       Intervention Provide education and explanation of THRR including how the numbers were predicted and where they are located for reference       Expected Outcomes Short Term: Able to state/look up THRR;Short Term: Able to use daily as guideline for intensity in rehab;Long Term: Able to use THRR to govern intensity when exercising independently       Able to check pulse independently Yes        Intervention Provide education and demonstration on how to check pulse in carotid and radial arteries.;Review the importance of being able to check your own pulse for safety during independent exercise       Expected Outcomes Short Term: Able to explain why pulse checking is important during independent exercise;Long Term: Able to check pulse independently and accurately       Understanding of Exercise Prescription Yes       Intervention Provide education, explanation, and written materials on patient's individual exercise prescription       Expected Outcomes Short Term: Able to explain program exercise prescription;Long Term: Able to explain home exercise prescription to exercise independently                Copy of goals given to participant.

## 2022-08-11 ENCOUNTER — Encounter: Payer: Self-pay | Admitting: *Deleted

## 2022-08-11 ENCOUNTER — Encounter: Payer: BC Managed Care – PPO | Admitting: *Deleted

## 2022-08-11 DIAGNOSIS — Z951 Presence of aortocoronary bypass graft: Secondary | ICD-10-CM

## 2022-08-11 NOTE — Progress Notes (Signed)
Daily Session Note  Patient Details  Name: BLASA RAISCH MRN: [id number] Date of Birth: 12-Nov-1960 Referring Provider:   Flowsheet Row Cardiac Rehab from 08/09/2022 in [facility] Cardiac and Pulmonary Rehab  Referring Provider Pleas Patricia MD       Encounter Date: 08/11/2022  Check In:  Session Check In - 08/11/22 1034       Check-In   Supervising physician immediately available to respond to emergencies See telemetry face sheet for immediately available ER MD    Location [facility]-Cardiac & Pulmonary Rehab    Staff Present Antionette Fairy, BS, Exercise Physiologist;Joseph Rosebud Poles, RN, [place]    Virtual Visit No    Medication changes reported     No    Fall or balance concerns reported    No    Warm-up and Cool-down Performed on first and last piece of equipment    Resistance Training Performed Yes    VAD Patient? No    PAD/SET Patient? No      Pain Assessment   Currently in Pain? No/denies                Social History   Tobacco Use  Smoking Status Never  Smokeless Tobacco Never    Goals Met:  Independence with exercise equipment Exercise tolerated well No report of concerns or symptoms today Strength training completed today  Goals Unmet:  Not Applicable  Comments: First full day of exercise!  Patient was oriented to gym and equipment including functions, settings, policies, and procedures.  Patient's individual exercise prescription and treatment plan were reviewed.  All starting workloads were established based on the results of the 6 minute walk test done at initial orientation visit.  The plan for exercise progression was also introduced and progression will be customized based on patient's performance and goals.    Dr. Emily Filbert is Medical Director for [facility].  Dr. Ottie Glazier is Medical Director for [facility] Pulmonary Rehabilitation.

## 2022-08-11 NOTE — Progress Notes (Signed)
Cardiac Individual Treatment Plan  Patient Details  Name: NICKIA BOESEN MRN: [id number] Date of Birth: 1961/02/17 Referring Provider:   Flowsheet Row Cardiac Rehab from 08/09/2022 in [facility] Cardiac and Pulmonary Rehab  Referring Provider Pleas Patricia MD       Initial Encounter Date:  Flowsheet Row Cardiac Rehab from 08/09/2022 in [facility] Cardiac and Pulmonary Rehab  Date 08/09/22       Visit Diagnosis: S/P CABG x 3  Patient's Home Medications on Admission:  Current Outpatient Medications:    albuterol (VENTOLIN HFA) 108 (90 Base) MCG/ACT inhaler, Inhale 2 puffs into the lungs every 4 (four) hours as needed for wheezing or shortness of breath. (Patient not taking: Reported on 08/02/2022), Disp: 8 g, Rfl: 2   amiodarone (PACERONE) 200 MG tablet, Take by mouth., Disp: , Rfl:    apixaban (ELIQUIS) 5 MG TABS tablet, Take 1 tablet (5 mg total) by mouth 2 (two) times daily., Disp: 60 tablet, Rfl: 11   apixaban (ELIQUIS) 5 MG TABS tablet, Take by mouth., Disp: , Rfl:    aspirin EC 81 MG tablet, Take by mouth., Disp: , Rfl:    beclomethasone (QVAR) 80 MCG/ACT inhaler, Inhale 1 puff into the lungs in the morning and at bedtime. (Patient not taking: Reported on 08/02/2022), Disp: 1 each, Rfl: 12   bisoprolol (ZEBETA) 10 MG tablet, Take 1 tablet (10 mg total) by mouth daily., Disp: 90 tablet, Rfl: 3   carvedilol (COREG) 12.5 MG tablet, Take by mouth., Disp: , Rfl:    cloNIDine (CATAPRES) 0.1 MG tablet, TAKE 1 TABLET BY MOUTH EVERYDAY AT BEDTIME, Disp: 90 tablet, Rfl: 3   dicyclomine (BENTYL) 10 MG capsule, TAKE 1 CAPSULE (10 MG TOTAL) BY MOUTH 4 (FOUR) TIMES DAILY - BEFORE MEALS AND AT BEDTIME. (Patient not taking: Reported on 08/02/2022), Disp: 360 capsule, Rfl: 0   diltiazem (CARDIZEM CD) 240 MG 24 hr capsule, TAKE 1 CAPSULE BY MOUTH EVERY DAY, Disp: 90 capsule, Rfl: 3   fluticasone (FLONASE) 50 MCG/ACT nasal spray, PLACE 1 SPRAY INTO BOTH NOSTRILS DAILY AS NEEDED FOR ALLERGIES., Disp: 48 mL,  Rfl: 3   furosemide (LASIX) 20 MG tablet, Take 1 tablet (20 mg total) by mouth daily as needed for edema (only as needed for swelling)., Disp: 30 tablet, Rfl: 6   hydrALAZINE (APRESOLINE) 25 MG tablet, Take 1 tablet (25 mg total) by mouth 2 (two) times daily. (Patient not taking: Reported on 08/02/2022), Disp: 180 tablet, Rfl: 3   levocetirizine (XYZAL) 5 MG tablet, Take 5 mg by mouth at bedtime. , Disp: , Rfl:    levocetirizine (XYZAL) 5 MG tablet, Take by mouth., Disp: , Rfl:    lisinopril (ZESTRIL) 20 MG tablet, Take by mouth., Disp: , Rfl:    metoprolol tartrate (LOPRESSOR) 25 MG tablet, Take 1 tablet (25 mg total) by mouth 2 (two) times daily as needed (AS NEEDED FOR PALPITATIONS/ATRIAL FIBRILATION). (Patient not taking: Reported on 08/02/2022), Disp: 90 tablet, Rfl: 3   montelukast (SINGULAIR) 10 MG tablet, TAKE 1 TABLET BY MOUTH EVERYDAY AT BEDTIME, Disp: 90 tablet, Rfl: 0   montelukast (SINGULAIR) 10 MG tablet, Take 1 tablet by mouth at bedtime., Disp: , Rfl:    omeprazole (PRILOSEC) 40 MG capsule, TAKE 1 CAPSULE BY MOUTH TWICE A DAY, Disp: 180 capsule, Rfl: 0   pantoprazole (PROTONIX) 40 MG tablet, , Disp: , Rfl:    rosuvastatin (CRESTOR) 20 MG tablet, TAKE 1 TABLET BY MOUTH EVERY DAY, Disp: 90 tablet, Rfl: 3   spironolactone (  ALDACTONE) 50 MG tablet, Take 1 tablet (50 mg total) by mouth daily. (Patient not taking: Reported on 08/02/2022), Disp: 90 tablet, Rfl: 3  Past Medical History: Past Medical History:  Diagnosis Date   Allergy    Anxiety    patient denies   Asthma    Cataract    Dysplastic nevus 08/28/2018   R paraspinal mid to upper back - severe, excision 11/21/2018   Dysplastic nevus 08/28/2018   L lat breast sup - moderate   Dysplastic nevus 07/02/2019   R mid to low back 4.0 cm lat to spine - moderate   Dysplastic nevus 07/02/2019   R inframammary - moderate   GERD (gastroesophageal reflux disease)    Glaucoma    History of echocardiogram    a. 11/2015: EF 55-60%, no  RWMA, nl LV diastolic fxn, PASP nl   Hypertension    Obesity    PAF (paroxysmal atrial fibrillation) ([place])    a. CHADS2VASc = 2 (HTN, sex category)   Sinusitis 2011   Recurrent per Dr. Tami Ribas with [facility]    Tobacco Use: Social History   Tobacco Use  Smoking Status Never  Smokeless Tobacco Never    Labs: Review Flowsheet  More data exists      Latest Ref Rng & Units 08/04/2017 10/14/2017 12/01/2018 02/18/2020 05/27/2021  Labs for ITP Cardiac and Pulmonary Rehab  Cholestrol 0 - 200 mg/dL 168  155  158  176  160   LDL (calc) 0 - 99 mg/dL 56  48  43  62  60   HDL-C >39.00 mg/dL 101.50  95.40  104.90  94.50  76.60   Trlycerides 0.0 - 149.0 mg/dL 51.0  62.0  51.0  96.0  117.0      Exercise Target Goals: Exercise Program Goal: Individual exercise prescription set using results from initial 6 min walk test and THRR while considering  patient's activity barriers and safety.   Exercise Prescription Goal: Initial exercise prescription builds to 30-45 minutes a day of aerobic activity, 2-3 days per week.  Home exercise guidelines will be given to patient during program as part of exercise prescription that the participant will acknowledge.   Education: Aerobic Exercise: - Group verbal and visual presentation on the components of exercise prescription. Introduces F.I.T.T principle from ACSM for exercise prescriptions.  Reviews F.I.T.T. principles of aerobic exercise including progression. Written material given at graduation.   Education: Resistance Exercise: - Group verbal and visual presentation on the components of exercise prescription. Introduces F.I.T.T principle from ACSM for exercise prescriptions  Reviews F.I.T.T. principles of resistance exercise including progression. Written material given at graduation.    Education: Exercise & Equipment Safety: - Individual verbal instruction and demonstration of equipment use and safety with use of the equipment. Flowsheet Row  Cardiac Rehab from 08/09/2022 in [facility] Cardiac and Pulmonary Rehab  Date 08/02/22  Educator [facility]  Instruction Review Code 1- Verbalizes Understanding       Education: Exercise Physiology & General Exercise Guidelines: - Group verbal and written instruction with models to review the exercise physiology of the cardiovascular system and associated critical values. Provides general exercise guidelines with specific guidelines to those with heart or lung disease.    Education: Flexibility, Balance, Mind/Body Relaxation: - Group verbal and visual presentation with interactive activity on the components of exercise prescription. Introduces F.I.T.T principle from ACSM for exercise prescriptions. Reviews F.I.T.T. principles of flexibility and balance exercise training including progression. Also discusses the mind body connection.  Reviews various  relaxation techniques to help reduce and manage stress (i.e. Deep breathing, progressive muscle relaxation, and visualization). Balance handout provided to take home. Written material given at graduation.   Activity Barriers & Risk Stratification:  Activity Barriers & Cardiac Risk Stratification - 08/09/22 1155       Activity Barriers & Cardiac Risk Stratification   Activity Barriers Deconditioning;Muscular Weakness;Balance Concerns    Cardiac Risk Stratification High             6 Minute Walk:  6 Minute Walk     Row Name 08/09/22 1154         6 Minute Walk   Phase Initial     Distance 1200 feet     Walk Time 6 minutes     # of Rest Breaks 0     MPH 2.27     METS 3.21     RPE 11     Perceived Dyspnea  1     VO2 Peak 11.24     Symptoms Yes (comment)     Comments SOB     Resting HR 74 bpm     Resting BP 136/74     Resting Oxygen Saturation  100 %     Exercise Oxygen Saturation  during 6 min walk 100 %     Max Ex. HR 96 bpm     Max Ex. BP 174/82     2 Minute Post BP 142/70              Oxygen Initial Assessment:   Oxygen  Re-Evaluation:   Oxygen Discharge (Final Oxygen Re-Evaluation):   Initial Exercise Prescription:  Initial Exercise Prescription - 08/09/22 1100       Date of Initial Exercise RX and Referring Provider   Date 08/09/22    Referring Provider Pleas Patricia MD      Oxygen   Maintain Oxygen Saturation 88% or higher      Treadmill   MPH 2.2    Grade 1    Minutes 15    METs 2.99      Recumbant Elliptical   Level 1    RPM 50    Minutes 15    METs 3      REL-XR   Level 3    Speed 50    Minutes 15    METs 3      Prescription Details   Frequency (times per week) 3    Duration Progress to 30 minutes of continuous aerobic without signs/symptoms of physical distress      Intensity   THRR 40-80% of Max Heartrate 109-142    Ratings of Perceived Exertion 11-13    Perceived Dyspnea 0-4      Progression   Progression Continue to progress workloads to maintain intensity without signs/symptoms of physical distress.      Resistance Training   Training Prescription Yes    Weight 4 lb    Reps 10-15             Perform Capillary Blood Glucose checks as needed.  Exercise Prescription Changes:   Exercise Prescription Changes     Row Name 08/09/22 1100             Response to Exercise   Blood Pressure (Admit) 136/74       Blood Pressure (Exercise) 174/82       Blood Pressure (Exit) 142/70       Heart Rate (Admit) 75 bpm       Heart  Rate (Exercise) 96 bpm       Heart Rate (Exit) 78 bpm       Oxygen Saturation (Admit) 100 %       Oxygen Saturation (Exercise) 100 %       Rating of Perceived Exertion (Exercise) 11       Perceived Dyspnea (Exercise) 1       Symptoms SOB       Comments walk test results                Exercise Comments:   Exercise Goals and Review:   Exercise Goals     Row Name 08/09/22 1200             Exercise Goals   Increase Physical Activity Yes       Intervention Provide advice, education, support and counseling about  physical activity/exercise needs.;Develop an individualized exercise prescription for aerobic and resistive training based on initial evaluation findings, risk stratification, comorbidities and participant's personal goals.       Expected Outcomes Short Term: Attend rehab on a regular basis to increase amount of physical activity.;Long Term: Add in home exercise to make exercise part of routine and to increase amount of physical activity.;Long Term: Exercising regularly at least 3-5 days a week.       Increase Strength and Stamina Yes       Intervention Provide advice, education, support and counseling about physical activity/exercise needs.;Develop an individualized exercise prescription for aerobic and resistive training based on initial evaluation findings, risk stratification, comorbidities and participant's personal goals.       Expected Outcomes Short Term: Increase workloads from initial exercise prescription for resistance, speed, and METs.;Short Term: Perform resistance training exercises routinely during rehab and add in resistance training at home;Long Term: Improve cardiorespiratory fitness, muscular endurance and strength as measured by increased METs and functional capacity (6MWT)       Able to understand and use rate of perceived exertion (RPE) scale Yes       Intervention Provide education and explanation on how to use RPE scale       Expected Outcomes Short Term: Able to use RPE daily in rehab to express subjective intensity level;Long Term:  Able to use RPE to guide intensity level when exercising independently       Able to understand and use Dyspnea scale Yes       Intervention Provide education and explanation on how to use Dyspnea scale       Expected Outcomes Short Term: Able to use Dyspnea scale daily in rehab to express subjective sense of shortness of breath during exertion;Long Term: Able to use Dyspnea scale to guide intensity level when exercising independently       Knowledge  and understanding of Target Heart Rate Range (THRR) Yes       Intervention Provide education and explanation of THRR including how the numbers were predicted and where they are located for reference       Expected Outcomes Short Term: Able to state/look up THRR;Short Term: Able to use daily as guideline for intensity in rehab;Long Term: Able to use THRR to govern intensity when exercising independently       Able to check pulse independently Yes       Intervention Provide education and demonstration on how to check pulse in carotid and radial arteries.;Review the importance of being able to check your own pulse for safety during independent exercise       Expected Outcomes  Short Term: Able to explain why pulse checking is important during independent exercise;Long Term: Able to check pulse independently and accurately       Understanding of Exercise Prescription Yes       Intervention Provide education, explanation, and written materials on patient's individual exercise prescription       Expected Outcomes Short Term: Able to explain program exercise prescription;Long Term: Able to explain home exercise prescription to exercise independently                Exercise Goals Re-Evaluation :   Discharge Exercise Prescription (Final Exercise Prescription Changes):  Exercise Prescription Changes - 08/09/22 1100       Response to Exercise   Blood Pressure (Admit) 136/74    Blood Pressure (Exercise) 174/82    Blood Pressure (Exit) 142/70    Heart Rate (Admit) 75 bpm    Heart Rate (Exercise) 96 bpm    Heart Rate (Exit) 78 bpm    Oxygen Saturation (Admit) 100 %    Oxygen Saturation (Exercise) 100 %    Rating of Perceived Exertion (Exercise) 11    Perceived Dyspnea (Exercise) 1    Symptoms SOB    Comments walk test results             Nutrition:  Target Goals: Understanding of nutrition guidelines, daily intake of sodium 1500mg , cholesterol 200mg , calories 30% from fat and 7% or  less from saturated fats, daily to have 5 or more servings of fruits and vegetables.  Education: All About Nutrition: -Group instruction provided by verbal, written material, interactive activities, discussions, models, and posters to present general guidelines for heart healthy nutrition including fat, fiber, MyPlate, the role of sodium in heart healthy nutrition, utilization of the nutrition label, and utilization of this knowledge for meal planning. Follow up email sent as well. Written material given at graduation. Flowsheet Row Cardiac Rehab from 08/09/2022 in [facility] Cardiac and Pulmonary Rehab  Education need identified 08/09/22       Biometrics:  Pre Biometrics - 08/09/22 1200       Pre Biometrics   Height 5' 5.5" (1.664 m)    Weight 194 lb 12.8 oz (88.4 kg)    Waist Circumference 38.5 inches    Hip Circumference 43.5 inches    Waist to Hip Ratio 0.89 %    BMI (Calculated) 31.91    Single Leg Stand 2.7 seconds              Nutrition Therapy Plan and Nutrition Goals:  Nutrition Therapy & Goals - 08/09/22 1123       Nutrition Therapy   Diet Heart healthy, low Na    Drug/Food Interactions Statins/Certain Fruits    Protein (specify units) 70-75g    Fiber 25 grams    Whole Grain Foods 3 servings    Saturated Fats 14 max. grams    Fruits and Vegetables 8 servings/day      Personal Nutrition Goals   Nutrition Goal ST: LT:    Comments 61 y.o. F admitted to cardiac rehab s/p CABG x3. PMHx includes CAD, hypercholesterolemia, HTN, paroxysmal a.fib. Relevant medications includes fluticasone, omeprazole, crestor. She has cut back on her portion sizes. L: leftovers (ex: chicken salad and some pasta salad) D: example steak tonight with potato and salad. Drinks: gatorade zero, sparkling, water. Emonni reports using olive oil and avocado oils with cooking, however, she will like to use butter, milk, and cheese with cooking - discussed some options to lower amount of high  fat dairy  with cooking and encouraged to choose recipes with lower amounts of dairy at least 2-3 times per week. She reports that her lab values for sodium have been low and her MD recommended to include sodium in her diet. She checks her BP regularly and it has been normal to high; sugested limiting Na to 2300mg  at the higher end to balance lower sodium numbers and elevated BP - informed her that we may need to make adjustments to this recommendation depeing on her lab and BP numbers moving foward. She reports liking pasta - encouaged her that she can still have pasta, but add lean protein and non-starchy vegetables so it is more balances and CHO do not take up most of the plate. Since she has decreased her portion sizes as well as starchy vegetables, discussed how heart healthy changes like increased non-starchy vegetables as a portion of the plate may cause her to be hungrier soon after eating and that healthy snacking may help to control hunger. Discussed heart healthy eating and MyPlate. Ajna feels that she has the tools she needs to achieve her goals with no major barriers reported.      Intervention Plan   Intervention Prescribe, educate and counsel regarding individualized specific dietary modifications aiming towards targeted core components such as weight, hypertension, lipid management, diabetes, heart failure and other comorbidities.;Nutrition handout(s) given to patient.    Expected Outcomes Short Term Goal: Understand basic principles of dietary content, such as calories, fat, sodium, cholesterol and nutrients.;Short Term Goal: A plan has been developed with personal nutrition goals set during dietitian appointment.;Long Term Goal: Adherence to prescribed nutrition plan.             Nutrition Assessments:  MEDIFICTS Score Key: ?70 Need to make dietary changes  40-70 Heart Healthy Diet ? 40 Therapeutic Level Cholesterol Diet  Flowsheet Row Cardiac Rehab from 08/09/2022 in [facility] Cardiac and  Pulmonary Rehab  Picture Your Plate Total Score on Admission 60      Picture Your Plate Scores: <27 Unhealthy dietary pattern with much room for improvement. 41-50 Dietary pattern unlikely to meet recommendations for good health and room for improvement. 51-60 More healthful dietary pattern, with some room for improvement.  >60 Healthy dietary pattern, although there may be some specific behaviors that could be improved.    Nutrition Goals Re-Evaluation:   Nutrition Goals Discharge (Final Nutrition Goals Re-Evaluation):   Psychosocial: Target Goals: Acknowledge presence or absence of significant depression and/or stress, maximize coping skills, provide positive support system. Participant is able to verbalize types and ability to use techniques and skills needed for reducing stress and depression.   Education: Stress, Anxiety, and Depression - Group verbal and visual presentation to define topics covered.  Reviews how body is impacted by stress, anxiety, and depression.  Also discusses healthy ways to reduce stress and to treat/manage anxiety and depression.  Written material given at graduation. Flowsheet Row Cardiac Rehab from 08/09/2022 in [facility] Cardiac and Pulmonary Rehab  Education need identified 08/09/22       Education: Sleep Hygiene -Provides group verbal and written instruction about how sleep can affect your health.  Define sleep hygiene, discuss sleep cycles and impact of sleep habits. Review good sleep hygiene tips.    Initial Review & Psychosocial Screening:  Initial Psych Review & Screening - 08/02/22 1125       Initial Review   Current issues with None Identified      Family Dynamics   Good Support System?  Yes    Comments Ahmani can look to her spouse family and freinds for support. She feels good about her health and is ready to make changes.      Barriers   Psychosocial barriers to participate in program The patient should benefit from training in stress  management and relaxation.;There are no identifiable barriers or psychosocial needs.      Screening Interventions   Interventions Encouraged to exercise;To provide support and resources with identified psychosocial needs;Provide feedback about the scores to participant    Expected Outcomes Short Term goal: Utilizing psychosocial counselor, staff and physician to assist with identification of specific Stressors or current issues interfering with healing process. Setting desired goal for each stressor or current issue identified.;Long Term Goal: Stressors or current issues are controlled or eliminated.;Short Term goal: Identification and review with participant of any Quality of Life or Depression concerns found by scoring the questionnaire.;Long Term goal: The participant improves quality of Life and PHQ9 Scores as seen by post scores and/or verbalization of changes             Quality of Life Scores:   Quality of Life - 08/09/22 1200       Quality of Life   Select Quality of Life      Quality of Life Scores   Health/Function Pre 26 %    Socioeconomic Pre 23.75 %    Psych/Spiritual Pre 30 %    Family Pre 28.8 %    GLOBAL Pre 26.69 %            Scores of 19 and below usually indicate a poorer quality of life in these areas.  A difference of  2-3 points is a clinically meaningful difference.  A difference of 2-3 points in the total score of the Quality of Life Index has been associated with significant improvement in overall quality of life, self-image, physical symptoms, and general health in studies assessing change in quality of life.  PHQ-9: Review Flowsheet  More data exists      08/09/2022 06/26/2021 02/21/2020 12/05/2018 09/26/2018  Depression screen PHQ 2/9  Decreased Interest 0 0 0 0 0  Down, Depressed, Hopeless 0 0 0 0 0  PHQ - 2 Score 0 0 0 0 0  Altered sleeping 0 - - - -  Tired, decreased energy 0 - - - -  Change in appetite 0 - - - -  Feeling bad or failure about  yourself  0 - - - -  Trouble concentrating 0 - - - -  Moving slowly or fidgety/restless 0 - - - -  Suicidal thoughts 0 - - - -  PHQ-9 Score 0 - - - -  Difficult doing work/chores Not difficult at all - - - -   Interpretation of Total Score  Total Score Depression Severity:  1-4 = Minimal depression, 5-9 = Mild depression, 10-14 = Moderate depression, 15-19 = Moderately severe depression, 20-27 = Severe depression   Psychosocial Evaluation and Intervention:  Psychosocial Evaluation - 08/02/22 1127       Psychosocial Evaluation & Interventions   Interventions Encouraged to exercise with the program and follow exercise prescription;Relaxation education;Stress management education    Comments Miosha can look to her spouse family and freinds for support. She feels good about her health and is ready to make changes.    Expected Outcomes Short: Start [facility] to help with mood. Long: Maintain a healthy mental state    Continue Psychosocial Services  Follow up required by  staff             Psychosocial Re-Evaluation:   Psychosocial Discharge (Final Psychosocial Re-Evaluation):   Vocational Rehabilitation: Provide vocational rehab assistance to qualifying candidates.   Vocational Rehab Evaluation & Intervention:   Education: Education Goals: Education classes will be provided on a variety of topics geared toward better understanding of heart health and risk factor modification. Participant will state understanding/return demonstration of topics presented as noted by education test scores.  Learning Barriers/Preferences:  Learning Barriers/Preferences - 08/02/22 1124       Learning Barriers/Preferences   Learning Barriers None    Learning Preferences None             General Cardiac Education Topics:  AED/CPR: - Group verbal and written instruction with the use of models to demonstrate the basic use of the AED with the basic ABC's of resuscitation.   Anatomy and  Cardiac Procedures: - Group verbal and visual presentation and models provide information about basic cardiac anatomy and function. Reviews the testing methods done to diagnose heart disease and the outcomes of the test results. Describes the treatment choices: Medical Management, Angioplasty, or Coronary Bypass Surgery for treating various heart conditions including Myocardial Infarction, Angina, Valve Disease, and Cardiac Arrhythmias.  Written material given at graduation.   Medication Safety: - Group verbal and visual instruction to review commonly prescribed medications for heart and lung disease. Reviews the medication, class of the drug, and side effects. Includes the steps to properly store meds and maintain the prescription regimen.  Written material given at graduation.   Intimacy: - Group verbal instruction through game format to discuss how heart and lung disease can affect sexual intimacy. Written material given at graduation.. Flowsheet Row Cardiac Rehab from 08/09/2022 in [facility] Cardiac and Pulmonary Rehab  Education need identified 08/09/22       Know Your Numbers and Heart Failure: - Group verbal and visual instruction to discuss disease risk factors for cardiac and pulmonary disease and treatment options.  Reviews associated critical values for Overweight/Obesity, Hypertension, Cholesterol, and Diabetes.  Discusses basics of heart failure: signs/symptoms and treatments.  Introduces Heart Failure Zone chart for action plan for heart failure.  Written material given at graduation.   Infection Prevention: - Provides verbal and written material to individual with discussion of infection control including proper hand washing and proper equipment cleaning during exercise session. Flowsheet Row Cardiac Rehab from 08/09/2022 in [facility] Cardiac and Pulmonary Rehab  Date 08/02/22  Educator [facility]  Instruction Review Code 1- Verbalizes Understanding       Falls Prevention: - Provides verbal  and written material to individual with discussion of falls prevention and safety. Flowsheet Row Cardiac Rehab from 08/09/2022 in [facility] Cardiac and Pulmonary Rehab  Date 08/02/22  Educator [facility]  Instruction Review Code 1- Verbalizes Understanding       Other: -Provides group and verbal instruction on various topics (see comments)   Knowledge Questionnaire Score:  Knowledge Questionnaire Score - 08/09/22 1201       Knowledge Questionnaire Score   Pre Score 23/26             Core Components/Risk Factors/Patient Goals at Admission:  Personal Goals and Risk Factors at Admission - 08/09/22 1202       Core Components/Risk Factors/Patient Goals on Admission    Weight Management Yes;Weight Loss;Obesity    Intervention Weight Management: Develop a combined nutrition and exercise program designed to reach desired caloric intake, while maintaining appropriate intake of nutrient and  fiber, sodium and fats, and appropriate energy expenditure required for the weight goal.;Weight Management/Obesity: Establish reasonable short term and long term weight goals.;Weight Management: Provide education and appropriate resources to help participant work on and attain dietary goals.;Obesity: Provide education and appropriate resources to help participant work on and attain dietary goals.    Admit Weight 194 lb 12.8 oz (88.4 kg)    Goal Weight: Short Term 190 lb (86.2 kg)    Goal Weight: Long Term 185 lb (83.9 kg)    Expected Outcomes Short Term: Continue to assess and modify interventions until short term weight is achieved;Long Term: Adherence to nutrition and physical activity/exercise program aimed toward attainment of established weight goal;Weight Loss: Understanding of general recommendations for a balanced deficit meal plan, which promotes 1-2 lb weight loss per week and includes a negative energy balance of [phone] kcal/d;Understanding recommendations for meals to include 15-35% energy as protein,  25-35% energy from fat, 35-60% energy from carbohydrates, less than 200mg  of dietary cholesterol, 20-35 gm of total fiber daily;Understanding of distribution of calorie intake throughout the day with the consumption of 4-5 meals/snacks    Hypertension Yes    Intervention Provide education on lifestyle modifcations including regular physical activity/exercise, weight management, moderate sodium restriction and increased consumption of fresh fruit, vegetables, and low fat dairy, alcohol moderation, and smoking cessation.;Monitor prescription use compliance.    Expected Outcomes Short Term: Continued assessment and intervention until BP is < 140/81mm HG in hypertensive participants. < 130/6mm HG in hypertensive participants with diabetes, heart failure or chronic kidney disease.;Long Term: Maintenance of blood pressure at goal levels.    Lipids Yes    Intervention Provide education and support for participant on nutrition & aerobic/resistive exercise along with prescribed medications to achieve LDL 70mg , HDL >40mg .    Expected Outcomes Short Term: Participant states understanding of desired cholesterol values and is compliant with medications prescribed. Participant is following exercise prescription and nutrition guidelines.;Long Term: Cholesterol controlled with medications as prescribed, with individualized exercise RX and with personalized nutrition plan. Value goals: LDL < 70mg , HDL > 40 mg.             Education:Diabetes - Individual verbal and written instruction to review signs/symptoms of diabetes, desired ranges of glucose level fasting, after meals and with exercise. Acknowledge that pre and post exercise glucose checks will be done for 3 sessions at entry of program.   Core Components/Risk Factors/Patient Goals Review:    Core Components/Risk Factors/Patient Goals at Discharge (Final Review):    ITP Comments:  ITP Comments     Row Name 08/02/22 1123 08/09/22 1154 08/11/22 0808        ITP Comments Virtual Visit completed. Patient informed on EP and RD appointment and 6 Minute walk test. Patient also informed of patient health questionnaires on My Chart. Patient Verbalizes understanding. Visit diagnosis can be found in [facility] 06/07/2022. Completed 6MWT and gym orientation. Initial ITP created and sent for review to Dr. Emily Filbert, Medical Director. 30 Day review completed. Medical Director ITP review done, changes made as directed, and signed approval by Medical Director.   New to program              Comments:

## 2022-08-13 ENCOUNTER — Encounter: Payer: BC Managed Care – PPO | Admitting: *Deleted

## 2022-08-13 DIAGNOSIS — Z951 Presence of aortocoronary bypass graft: Secondary | ICD-10-CM | POA: Diagnosis not present

## 2022-08-13 NOTE — Progress Notes (Signed)
Daily Session Note  Patient Details  Name: Jillisa C App MRN: [id number] Date of Birth: 05/03/1961 Referring Provider:   Flowsheet Row Cardiac Rehab from 08/09/2022 in [facility] Cardiac and Pulmonary Rehab  Referring Provider Alexander, John MD       Encounter Date: 08/13/2022  Check In:  Session Check In - 08/13/22 1038       Check-In   Supervising physician immediately available to respond to emergencies See telemetry face sheet for immediately available ER MD    Location [facility]-Cardiac & Pulmonary Rehab    Staff Present Susanne Bice, RN, BSN, CCRP;Jessica Hawkins, MA, RCEP, CCRP, CCET;Joseph Hood, RCP,RRT,BSRT    Virtual Visit No    Medication changes reported     No    Fall or balance concerns reported    No    Warm-up and Cool-down Performed on first and last piece of equipment    Resistance Training Performed Yes    VAD Patient? No    PAD/SET Patient? No      Pain Assessment   Currently in Pain? No/denies                Social History   Tobacco Use  Smoking Status Never  Smokeless Tobacco Never    Goals Met:  Independence with exercise equipment Exercise tolerated well No report of concerns or symptoms today  Goals Unmet:  Not Applicable  Comments: Pt able to follow exercise prescription today without complaint.  Will continue to monitor for progression.    Dr. Mark Miller is Medical Director for [facility].  Dr. Fuad Aleskerov is Medical Director for [facility] Pulmonary [facility]. 

## 2022-08-25 ENCOUNTER — Encounter: Payer: BC Managed Care – PPO | Admitting: *Deleted

## 2022-08-25 DIAGNOSIS — Z951 Presence of aortocoronary bypass graft: Secondary | ICD-10-CM

## 2022-08-25 NOTE — Progress Notes (Signed)
Daily Session Note  Patient Details  Name: Amanda Estrada MRN: [id number] Date of Birth: 05/30/1961 Referring Provider:   Flowsheet Row Cardiac Rehab from 08/09/2022 in [facility] Cardiac and Pulmonary Rehab  Referring Provider Pleas Patricia MD       Encounter Date: 08/25/2022  Check In:  Session Check In - 08/25/22 1003       Check-In   Supervising physician immediately available to respond to emergencies See telemetry face sheet for immediately available ER MD    Location [facility]-Cardiac & Pulmonary Rehab    Staff Present Antionette Fairy, BS, Exercise Physiologist;Jessica Clarkson, MA, RCEP, CCRP, Mindi Curling, RN, [place]    Virtual Visit No    Medication changes reported     No    Fall or balance concerns reported    No    Warm-up and Cool-down Performed on first and last piece of equipment    Resistance Training Performed Yes    VAD Patient? No    PAD/SET Patient? No      Pain Assessment   Currently in Pain? No/denies               Exercise Prescription Changes - 08/25/22 1000       Home Exercise Plan   Plans to continue exercise at Home (comment)   walking, staff videos   Frequency Add 2 additional days to program exercise sessions.    Initial Home Exercises Provided 08/25/22             Social History   Tobacco Use  Smoking Status Never  Smokeless Tobacco Never    Goals Met:  Independence with exercise equipment Exercise tolerated well No report of concerns or symptoms today Strength training completed today  Goals Unmet:  Not Applicable  Comments: Pt able to follow exercise prescription today without complaint.  Will continue to monitor for progression.    Dr. Emily Filbert is Medical Director for [place].  Dr. Ottie Glazier is Medical Director for [facility] Pulmonary Rehabilitation.

## 2022-08-25 NOTE — Progress Notes (Signed)
Reviewed home exercise with pt today.  Pt plans to walk at home and at beach for exercise.  Reviewed THR, pulse, RPE, sign and symptoms, pulse oximetery and when to call 911 or MD.  Also discussed weather considerations and indoor options.  Pt voiced understanding.

## 2022-08-27 ENCOUNTER — Encounter: Payer: BC Managed Care – PPO | Admitting: *Deleted

## 2022-08-27 DIAGNOSIS — Z951 Presence of aortocoronary bypass graft: Secondary | ICD-10-CM

## 2022-08-27 NOTE — Progress Notes (Signed)
Daily Session Note  Patient Details  Name: Amanda Estrada MRN: [id number] Date of Birth: June 09, 1961 Referring Provider:   Flowsheet Row Cardiac Rehab from 08/09/2022 in [facility] Cardiac and Pulmonary Rehab  Referring Provider Pleas Patricia MD       Encounter Date: 08/27/2022  Check In:  Session Check In - 08/27/22 1029       Check-In   Supervising physician immediately available to respond to emergencies See telemetry face sheet for immediately available ER MD    Location [facility]-Cardiac & Pulmonary Rehab    Staff Present Heath Lark, RN, BSN, CCRP;Jessica Clayton, MA, RCEP, CCRP, CCET;Joseph [place], [place]    Virtual Visit No    Medication changes reported     No    Fall or balance concerns reported    No    Warm-up and Cool-down Performed on first and last piece of equipment    Resistance Training Performed Yes    VAD Patient? No    PAD/SET Patient? No      Pain Assessment   Currently in Pain? No/denies                Social History   Tobacco Use  Smoking Status Never  Smokeless Tobacco Never    Goals Met:  Independence with exercise equipment Exercise tolerated well No report of concerns or symptoms today  Goals Unmet:  Not Applicable  Comments: Pt able to follow exercise prescription today without complaint.  Will continue to monitor for progression.    Dr. Emily Filbert is Medical Director for [place].  Dr. Ottie Glazier is Medical Director for [facility] Pulmonary Rehabilitation.

## 2022-08-30 ENCOUNTER — Encounter: Payer: BC Managed Care – PPO | Admitting: *Deleted

## 2022-08-30 DIAGNOSIS — Z951 Presence of aortocoronary bypass graft: Secondary | ICD-10-CM

## 2022-08-30 NOTE — Progress Notes (Signed)
Daily Session Note  Patient Details  Name: EMBERLEIGH REILY MRN: [id number] Date of Birth: [birth year] Referring Provider:   Flowsheet Row Cardiac Rehab from 08/09/2022 in [facility] Cardiac and Pulmonary Rehab  Referring Provider Pleas Patricia MD       Encounter Date: 08/30/2022  Check In:  Session Check In - 08/30/22 1329       Check-In   Supervising physician immediately available to respond to emergencies See telemetry face sheet for immediately available ER MD    Location [facility]-Cardiac & Pulmonary Rehab    Staff Present Alberteen Sam, MA, RCEP, CCRP, CCET;Joseph [place], Ernestina Patches, RN, [place]    Virtual Visit No    Medication changes reported     No    Fall or balance concerns reported    No    Warm-up and Cool-down Performed on first and last piece of equipment    Resistance Training Performed Yes    VAD Patient? No    PAD/SET Patient? No      Pain Assessment   Currently in Pain? No/denies                Social History   Tobacco Use  Smoking Status Never  Smokeless Tobacco Never    Goals Met:  Independence with exercise equipment Exercise tolerated well No report of concerns or symptoms today Strength training completed today  Goals Unmet:  Not Applicable  Comments: Pt able to follow exercise prescription today without complaint.  Will continue to monitor for progression.    Dr. Emily Filbert is Medical Director for [place].  Dr. Ottie Glazier is Medical Director for [facility] Pulmonary Rehabilitation.

## 2022-09-01 ENCOUNTER — Other Ambulatory Visit: Payer: Self-pay | Admitting: Gastroenterology

## 2022-09-01 ENCOUNTER — Encounter: Payer: BC Managed Care – PPO | Admitting: *Deleted

## 2022-09-01 DIAGNOSIS — Z951 Presence of aortocoronary bypass graft: Secondary | ICD-10-CM

## 2022-09-01 NOTE — Progress Notes (Signed)
Daily Session Note  Patient Details  Name: Amanda Estrada MRN: [id number] Date of Birth: 1961-05-03 Referring Provider:   Flowsheet Row Cardiac Rehab from 08/09/2022 in [facility] Cardiac and Pulmonary Rehab  Referring Provider Pleas Patricia MD       Encounter Date: 09/01/2022  Check In:  Session Check In - 09/01/22 1006       Check-In   Supervising physician immediately available to respond to emergencies See telemetry face sheet for immediately available ER MD    Location [facility]-Cardiac & Pulmonary Rehab    Staff Present Justin Mend, RCP,RRT,BSRT;Noah Tickle, BS, Exercise Physiologist;Tichina Koebel Tamala Julian, RN, ADN    Virtual Visit No    Medication changes reported     No    Fall or balance concerns reported    No    Warm-up and Cool-down Performed on first and last piece of equipment    Resistance Training Performed Yes    VAD Patient? No    PAD/SET Patient? No      Pain Assessment   Currently in Pain? No/denies                Social History   Tobacco Use  Smoking Status Never  Smokeless Tobacco Never    Goals Met:  Independence with exercise equipment Exercise tolerated well No report of concerns or symptoms today Strength training completed today  Goals Unmet:  Not Applicable  Comments: Pt able to follow exercise prescription today without complaint.  Will continue to monitor for progression.    Dr. Emily Filbert is Medical Director for [name].  Dr. Ottie Glazier is Medical Director for [facility] Pulmonary Rehabilitation.

## 2022-09-03 ENCOUNTER — Encounter: Payer: BC Managed Care – PPO | Admitting: *Deleted

## 2022-09-03 DIAGNOSIS — Z951 Presence of aortocoronary bypass graft: Secondary | ICD-10-CM | POA: Diagnosis not present

## 2022-09-03 NOTE — Progress Notes (Signed)
Daily Session Note  Patient Details  Name: Amanda Estrada MRN: [id number] Date of Birth: 1960/12/13 Referring Provider:   Flowsheet Row Cardiac Rehab from 08/09/2022 in [facility] Cardiac and Pulmonary Rehab  Referring Provider Pleas Patricia MD       Encounter Date: 09/03/2022  Check In:  Session Check In - 09/03/22 1007       Check-In   Supervising physician immediately available to respond to emergencies See telemetry face sheet for immediately available ER MD    Location [facility]-Cardiac & Pulmonary Rehab    Staff Present Heath Lark, RN, BSN, CCRP;Jessica [place], MA, RCEP, CCRP, CCET;Joseph [place], [place]    Virtual Visit No    Medication changes reported     No    Fall or balance concerns reported    No    Warm-up and Cool-down Performed on first and last piece of equipment    Resistance Training Performed Yes    VAD Patient? No    PAD/SET Patient? No      Pain Assessment   Currently in Pain? No/denies                Social History   Tobacco Use  Smoking Status Never  Smokeless Tobacco Never    Goals Met:  Independence with exercise equipment Exercise tolerated well No report of concerns or symptoms today  Goals Unmet:  Not Applicable  Comments: Pt able to follow exercise prescription today without complaint.  Will continue to monitor for progression.    Dr. Emily Filbert is Medical Director for [place].  Dr. Ottie Glazier is Medical Director for [facility] Pulmonary Rehabilitation.

## 2022-09-06 ENCOUNTER — Encounter: Payer: BC Managed Care – PPO | Admitting: *Deleted

## 2022-09-06 DIAGNOSIS — Z951 Presence of aortocoronary bypass graft: Secondary | ICD-10-CM | POA: Diagnosis not present

## 2022-09-06 NOTE — Progress Notes (Signed)
Daily Session Note  Patient Details  Name: Amanda Estrada MRN: [id number] Date of Birth: [birth year] Referring Provider:   Flowsheet Row Cardiac Rehab from 08/09/2022 in [facility] Cardiac and Pulmonary Rehab  Referring Provider Pleas Patricia MD       Encounter Date: 09/06/2022  Check In:  Session Check In - 09/06/22 1338       Check-In   Supervising physician immediately available to respond to emergencies See telemetry face sheet for immediately available ER MD    Location [facility]-Cardiac & Pulmonary Rehab    Staff Present Justin Mend, RCP,RRT,BSRT;Emmery Seiler Sherryll Burger, RN Odelia Gage, RN, ADN    Virtual Visit No    Medication changes reported     No    Fall or balance concerns reported    No    Warm-up and Cool-down Performed on first and last piece of equipment    Resistance Training Performed Yes    VAD Patient? No    PAD/SET Patient? No      Pain Assessment   Currently in Pain? No/denies                Social History   Tobacco Use  Smoking Status Never  Smokeless Tobacco Never    Goals Met:  Independence with exercise equipment Exercise tolerated well No report of concerns or symptoms today Strength training completed today  Goals Unmet:  Not Applicable  Comments: Pt able to follow exercise prescription today without complaint.  Will continue to monitor for progression.    Dr. Emily Filbert is Medical Director for [place].  Dr. Ottie Glazier is Medical Director for [facility] Pulmonary Rehabilitation.

## 2022-09-07 DIAGNOSIS — I2510 Atherosclerotic heart disease of native coronary artery without angina pectoris: Secondary | ICD-10-CM | POA: Diagnosis not present

## 2022-09-07 DIAGNOSIS — I480 Paroxysmal atrial fibrillation: Secondary | ICD-10-CM | POA: Diagnosis not present

## 2022-09-07 DIAGNOSIS — I10 Essential (primary) hypertension: Secondary | ICD-10-CM | POA: Diagnosis not present

## 2022-09-07 DIAGNOSIS — E7800 Pure hypercholesterolemia, unspecified: Secondary | ICD-10-CM | POA: Diagnosis not present

## 2022-09-08 ENCOUNTER — Encounter: Payer: Self-pay | Admitting: *Deleted

## 2022-09-08 DIAGNOSIS — Z951 Presence of aortocoronary bypass graft: Secondary | ICD-10-CM

## 2022-09-08 NOTE — Progress Notes (Signed)
Cardiac Individual Treatment Plan  Patient Details  Name: Amanda Estrada MRN: [id number] Date of Birth: [birth year] Referring Provider:   Flowsheet Row Cardiac Rehab from 08/09/2022 in [facility] Cardiac and Pulmonary Rehab  Referring Provider Pleas Patricia MD       Initial Encounter Date:  Flowsheet Row Cardiac Rehab from 08/09/2022 in [facility] Cardiac and Pulmonary Rehab  Date 08/09/22       Visit Diagnosis: S/P CABG x 3  Patient's Home Medications on Admission:  Current Outpatient Medications:    albuterol (VENTOLIN HFA) 108 (90 Base) MCG/ACT inhaler, Inhale 2 puffs into the lungs every 4 (four) hours as needed for wheezing or shortness of breath. (Patient not taking: Reported on 08/02/2022), Disp: 8 g, Rfl: 2   amiodarone (PACERONE) 200 MG tablet, Take by mouth., Disp: , Rfl:    apixaban (ELIQUIS) 5 MG TABS tablet, Take 1 tablet (5 mg total) by mouth 2 (two) times daily., Disp: 60 tablet, Rfl: 11   apixaban (ELIQUIS) 5 MG TABS tablet, Take by mouth., Disp: , Rfl:    aspirin EC 81 MG tablet, Take by mouth., Disp: , Rfl:    beclomethasone (QVAR) 80 MCG/ACT inhaler, Inhale 1 puff into the lungs in the morning and at bedtime. (Patient not taking: Reported on 08/02/2022), Disp: 1 each, Rfl: 12   bisoprolol (ZEBETA) 10 MG tablet, Take 1 tablet (10 mg total) by mouth daily., Disp: 90 tablet, Rfl: 3   carvedilol (COREG) 12.5 MG tablet, Take by mouth., Disp: , Rfl:    cloNIDine (CATAPRES) 0.1 MG tablet, TAKE 1 TABLET BY MOUTH EVERYDAY AT BEDTIME, Disp: 90 tablet, Rfl: 3   dicyclomine (BENTYL) 10 MG capsule, TAKE 1 CAPSULE (10 MG TOTAL) BY MOUTH 4 (FOUR) TIMES DAILY - BEFORE MEALS AND AT BEDTIME. (Patient not taking: Reported on 08/02/2022), Disp: 360 capsule, Rfl: 0   diltiazem (CARDIZEM CD) 240 MG 24 hr capsule, TAKE 1 CAPSULE BY MOUTH EVERY DAY, Disp: 90 capsule, Rfl: 3   fluticasone (FLONASE) 50 MCG/ACT nasal spray, PLACE 1 SPRAY INTO BOTH NOSTRILS DAILY AS NEEDED FOR ALLERGIES., Disp: 48 mL,  Rfl: 3   furosemide (LASIX) 20 MG tablet, Take 1 tablet (20 mg total) by mouth daily as needed for edema (only as needed for swelling)., Disp: 30 tablet, Rfl: 6   hydrALAZINE (APRESOLINE) 25 MG tablet, Take 1 tablet (25 mg total) by mouth 2 (two) times daily. (Patient not taking: Reported on 08/02/2022), Disp: 180 tablet, Rfl: 3   levocetirizine (XYZAL) 5 MG tablet, Take 5 mg by mouth at bedtime. , Disp: , Rfl:    levocetirizine (XYZAL) 5 MG tablet, Take by mouth., Disp: , Rfl:    lisinopril (ZESTRIL) 20 MG tablet, Take by mouth., Disp: , Rfl:    metoprolol tartrate (LOPRESSOR) 25 MG tablet, Take 1 tablet (25 mg total) by mouth 2 (two) times daily as needed (AS NEEDED FOR PALPITATIONS/ATRIAL FIBRILATION). (Patient not taking: Reported on 08/02/2022), Disp: 90 tablet, Rfl: 3   montelukast (SINGULAIR) 10 MG tablet, TAKE 1 TABLET BY MOUTH EVERYDAY AT BEDTIME, Disp: 90 tablet, Rfl: 0   montelukast (SINGULAIR) 10 MG tablet, Take 1 tablet by mouth at bedtime., Disp: , Rfl:    omeprazole (PRILOSEC) 40 MG capsule, TAKE 1 CAPSULE BY MOUTH TWICE A DAY, Disp: 180 capsule, Rfl: 0   pantoprazole (PROTONIX) 40 MG tablet, , Disp: , Rfl:    rosuvastatin (CRESTOR) 20 MG tablet, TAKE 1 TABLET BY MOUTH EVERY DAY, Disp: 90 tablet, Rfl: 3   spironolactone (  ALDACTONE) 50 MG tablet, Take 1 tablet (50 mg total) by mouth daily. (Patient not taking: Reported on 08/02/2022), Disp: 90 tablet, Rfl: 3  Past Medical History: Past Medical History:  Diagnosis Date   Allergy    Anxiety    patient denies   Asthma    Cataract    Dysplastic nevus 08/28/2018   R paraspinal mid to upper back - severe, excision 11/21/2018   Dysplastic nevus 08/28/2018   L lat breast sup - moderate   Dysplastic nevus 07/02/2019   R mid to low back 4.0 cm lat to spine - moderate   Dysplastic nevus 07/02/2019   R inframammary - moderate   GERD (gastroesophageal reflux disease)    Glaucoma    History of echocardiogram    a. 11/2015: EF 55-60%, no  RWMA, nl LV diastolic fxn, PASP nl   Hypertension    Obesity    PAF (paroxysmal atrial fibrillation) ([place])    a. CHADS2VASc = 2 (HTN, sex category)   Sinusitis 2011   Recurrent per Dr. Tami Ribas with [facility]    Tobacco Use: Social History   Tobacco Use  Smoking Status Never  Smokeless Tobacco Never    Labs: Review Flowsheet  More data exists      Latest Ref Rng & Units 08/04/2017 10/14/2017 12/01/2018 02/18/2020 05/27/2021  Labs for ITP Cardiac and Pulmonary Rehab  Cholestrol 0 - 200 mg/dL 168  155  158  176  160   LDL (calc) 0 - 99 mg/dL 56  48  43  62  60   HDL-C >39.00 mg/dL 101.50  95.40  104.90  94.50  76.60   Trlycerides 0.0 - 149.0 mg/dL 51.0  62.0  51.0  96.0  117.0      Exercise Target Goals: Exercise Program Goal: Individual exercise prescription set using results from initial 6 min walk test and THRR while considering  patient's activity barriers and safety.   Exercise Prescription Goal: Initial exercise prescription builds to 30-45 minutes a day of aerobic activity, 2-3 days per week.  Home exercise guidelines will be given to patient during program as part of exercise prescription that the participant will acknowledge.   Education: Aerobic Exercise: - Group verbal and visual presentation on the components of exercise prescription. Introduces F.I.T.T principle from ACSM for exercise prescriptions.  Reviews F.I.T.T. principles of aerobic exercise including progression. Written material given at graduation.   Education: Resistance Exercise: - Group verbal and visual presentation on the components of exercise prescription. Introduces F.I.T.T principle from ACSM for exercise prescriptions  Reviews F.I.T.T. principles of resistance exercise including progression. Written material given at graduation.    Education: Exercise & Equipment Safety: - Individual verbal instruction and demonstration of equipment use and safety with use of the equipment. Flowsheet Row  Cardiac Rehab from 09/01/2022 in [facility] Cardiac and Pulmonary Rehab  Date 08/02/22  Educator [facility]  Instruction Review Code 1- Verbalizes Understanding       Education: Exercise Physiology & General Exercise Guidelines: - Group verbal and written instruction with models to review the exercise physiology of the cardiovascular system and associated critical values. Provides general exercise guidelines with specific guidelines to those with heart or lung disease.    Education: Flexibility, Balance, Mind/Body Relaxation: - Group verbal and visual presentation with interactive activity on the components of exercise prescription. Introduces F.I.T.T principle from ACSM for exercise prescriptions. Reviews F.I.T.T. principles of flexibility and balance exercise training including progression. Also discusses the mind body connection.  Reviews various  relaxation techniques to help reduce and manage stress (i.e. Deep breathing, progressive muscle relaxation, and visualization). Balance handout provided to take home. Written material given at graduation.   Activity Barriers & Risk Stratification:  Activity Barriers & Cardiac Risk Stratification - 08/09/22 1155       Activity Barriers & Cardiac Risk Stratification   Activity Barriers Deconditioning;Muscular Weakness;Balance Concerns    Cardiac Risk Stratification High             6 Minute Walk:  6 Minute Walk     Row Name 08/09/22 1154         6 Minute Walk   Phase Initial     Distance 1200 feet     Walk Time 6 minutes     # of Rest Breaks 0     MPH 2.27     METS 3.21     RPE 11     Perceived Dyspnea  1     VO2 Peak 11.24     Symptoms Yes (comment)     Comments SOB     Resting HR 74 bpm     Resting BP 136/74     Resting Oxygen Saturation  100 %     Exercise Oxygen Saturation  during 6 min walk 100 %     Max Ex. HR 96 bpm     Max Ex. BP 174/82     2 Minute Post BP 142/70              Oxygen Initial Assessment:   Oxygen  Re-Evaluation:   Oxygen Discharge (Final Oxygen Re-Evaluation):   Initial Exercise Prescription:  Initial Exercise Prescription - 08/09/22 1100       Date of Initial Exercise RX and Referring Provider   Date 08/09/22    Referring Provider Pleas Patricia MD      Oxygen   Maintain Oxygen Saturation 88% or higher      Treadmill   MPH 2.2    Grade 1    Minutes 15    METs 2.99      Recumbant Elliptical   Level 1    RPM 50    Minutes 15    METs 3      REL-XR   Level 3    Speed 50    Minutes 15    METs 3      Prescription Details   Frequency (times per week) 3    Duration Progress to 30 minutes of continuous aerobic without signs/symptoms of physical distress      Intensity   THRR 40-80% of Max Heartrate 109-142    Ratings of Perceived Exertion 11-13    Perceived Dyspnea 0-4      Progression   Progression Continue to progress workloads to maintain intensity without signs/symptoms of physical distress.      Resistance Training   Training Prescription Yes    Weight 4 lb    Reps 10-15             Perform Capillary Blood Glucose checks as needed.  Exercise Prescription Changes:   Exercise Prescription Changes     Row Name 08/09/22 1100 08/17/22 1500 08/25/22 1000 08/30/22 1000       Response to Exercise   Blood Pressure (Admit) 136/74 128/62 -- 118/64    Blood Pressure (Exercise) 174/82 142/64 -- --    Blood Pressure (Exit) 142/70 122/62 -- 126/60    Heart Rate (Admit) 75 bpm 77 bpm -- 90 bpm  Heart Rate (Exercise) 96 bpm 104 bpm -- 122 bpm    Heart Rate (Exit) 78 bpm 75 bpm -- 105 bpm    Oxygen Saturation (Admit) 100 % -- -- --    Oxygen Saturation (Exercise) 100 % -- -- --    Rating of Perceived Exertion (Exercise) 11 12 -- 13    Perceived Dyspnea (Exercise) 1 -- -- --    Symptoms SOB none -- none    Comments walk test results -- -- --    Duration -- Continue with 30 min of aerobic exercise without signs/symptoms of physical distress. --  Continue with 30 min of aerobic exercise without signs/symptoms of physical distress.    Intensity -- THRR unchanged -- THRR unchanged      Progression   Progression -- Continue to progress workloads to maintain intensity without signs/symptoms of physical distress. -- Continue to progress workloads to maintain intensity without signs/symptoms of physical distress.    Average METs -- 2.65 -- 2.88      Resistance Training   Training Prescription -- Yes -- Yes    Weight -- 4 lb -- 4 lb    Reps -- 10-15 -- 10-15      Interval Training   Interval Training -- No -- No      Treadmill   MPH -- 2.2 -- 2.8    Grade -- 1 -- 1    Minutes -- 15 -- 15    METs -- 2.99 -- 3.53      Recumbant Elliptical   Level -- 2 -- 1.6    Minutes -- 15 -- 15    METs -- 2.1 -- 1.7      REL-XR   Level -- 4 -- 5    Minutes -- 15 -- 15    METs -- 2.5 -- 3      Home Exercise Plan   Plans to continue exercise at -- -- Home (comment)  walking, staff videos Home (comment)  walking, staff videos    Frequency -- -- Add 2 additional days to program exercise sessions. Add 2 additional days to program exercise sessions.    Initial Home Exercises Provided -- -- 08/25/22 08/25/22      Oxygen   Maintain Oxygen Saturation -- 88% or higher -- 88% or higher             Exercise Comments:   Exercise Comments     Row Name 08/11/22 1035           Exercise Comments First full day of exercise!  Patient was oriented to gym and equipment including functions, settings, policies, and procedures.  Patient's individual exercise prescription and treatment plan were reviewed.  All starting workloads were established based on the results of the 6 minute walk test done at initial orientation visit.  The plan for exercise progression was also introduced and progression will be customized based on patient's performance and goals.                Exercise Goals and Review:   Exercise Goals     Row Name 08/09/22 1200              Exercise Goals   Increase Physical Activity Yes       Intervention Provide advice, education, support and counseling about physical activity/exercise needs.;Develop an individualized exercise prescription for aerobic and resistive training based on initial evaluation findings, risk stratification, comorbidities and participant's personal goals.       Expected  Outcomes Short Term: Attend rehab on a regular basis to increase amount of physical activity.;Long Term: Add in home exercise to make exercise part of routine and to increase amount of physical activity.;Long Term: Exercising regularly at least 3-5 days a week.       Increase Strength and Stamina Yes       Intervention Provide advice, education, support and counseling about physical activity/exercise needs.;Develop an individualized exercise prescription for aerobic and resistive training based on initial evaluation findings, risk stratification, comorbidities and participant's personal goals.       Expected Outcomes Short Term: Increase workloads from initial exercise prescription for resistance, speed, and METs.;Short Term: Perform resistance training exercises routinely during rehab and add in resistance training at home;Long Term: Improve cardiorespiratory fitness, muscular endurance and strength as measured by increased METs and functional capacity (6MWT)       Able to understand and use rate of perceived exertion (RPE) scale Yes       Intervention Provide education and explanation on how to use RPE scale       Expected Outcomes Short Term: Able to use RPE daily in rehab to express subjective intensity level;Long Term:  Able to use RPE to guide intensity level when exercising independently       Able to understand and use Dyspnea scale Yes       Intervention Provide education and explanation on how to use Dyspnea scale       Expected Outcomes Short Term: Able to use Dyspnea scale daily in rehab to express subjective sense of  shortness of breath during exertion;Long Term: Able to use Dyspnea scale to guide intensity level when exercising independently       Knowledge and understanding of Target Heart Rate Range (THRR) Yes       Intervention Provide education and explanation of THRR including how the numbers were predicted and where they are located for reference       Expected Outcomes Short Term: Able to state/look up THRR;Short Term: Able to use daily as guideline for intensity in rehab;Long Term: Able to use THRR to govern intensity when exercising independently       Able to check pulse independently Yes       Intervention Provide education and demonstration on how to check pulse in carotid and radial arteries.;Review the importance of being able to check your own pulse for safety during independent exercise       Expected Outcomes Short Term: Able to explain why pulse checking is important during independent exercise;Long Term: Able to check pulse independently and accurately       Understanding of Exercise Prescription Yes       Intervention Provide education, explanation, and written materials on patient's individual exercise prescription       Expected Outcomes Short Term: Able to explain program exercise prescription;Long Term: Able to explain home exercise prescription to exercise independently                Exercise Goals Re-Evaluation :  Exercise Goals Re-Evaluation     Row Name 08/11/22 1035 08/17/22 1508 08/25/22 1034 08/30/22 1052       Exercise Goal Re-Evaluation   Exercise Goals Review Able to understand and use rate of perceived exertion (RPE) scale;Able to understand and use Dyspnea scale;Knowledge and understanding of Target Heart Rate Range (THRR);Understanding of Exercise Prescription Understanding of Exercise Prescription;Increase Physical Activity;Increase Strength and Stamina Understanding of Exercise Prescription;Increase Physical Activity;Increase Strength and Stamina;Able to understand  and use  rate of perceived exertion (RPE) scale;Able to understand and use Dyspnea scale;Able to check pulse independently;Knowledge and understanding of Target Heart Rate Range (THRR) Increase Physical Activity;Increase Strength and Stamina;Understanding of Exercise Prescription    Comments Reviewed RPE and dyspnea scales, THR and program prescription with pt today.  Pt voiced understanding and was given a copy of goals to take home. Enna is off to a good start in rehab. She recently improved her overall average MET level to 2.65 METs. She also did well on the treadmill at a speed of 2.2 mph and an incline of 1%. She tolerated 4 lb hand weights for resistance training as well. We will continue to monitor her progress in the program. Reviewed home exercise with pt today.  Pt plans to walk at home and at beach for exercise.  Reviewed THR, pulse, RPE, sign and symptoms, pulse oximetery and when to call 911 or MD.  Also discussed weather considerations and indoor options.  Pt voiced understanding. Janea is doing well for the first couple weeks she has been here for rehab. She has increased her treadmill speed to a 2.8 mph with a 1% incline.  She is also already up to level 5 on the XR using 4 lb handweights. We will continue to monitor as she progresses in the program.    Expected Outcomes Short: Use RPE daily to regulate intensity. Long: Follow program prescription in THR. Short: Continue to follow current exercise prescription. Long: Continue to increase strength and stamina. Short: Continue to walk on off days Long: Continue to improve stamina Short: Continue to increase worklod on treadmill Long: Continue to increase overall MET level             Discharge Exercise Prescription (Final Exercise Prescription Changes):  Exercise Prescription Changes - 08/30/22 1000       Response to Exercise   Blood Pressure (Admit) 118/64    Blood Pressure (Exit) 126/60    Heart Rate (Admit) 90 bpm    Heart Rate  (Exercise) 122 bpm    Heart Rate (Exit) 105 bpm    Rating of Perceived Exertion (Exercise) 13    Symptoms none    Duration Continue with 30 min of aerobic exercise without signs/symptoms of physical distress.    Intensity THRR unchanged      Progression   Progression Continue to progress workloads to maintain intensity without signs/symptoms of physical distress.    Average METs 2.88      Resistance Training   Training Prescription Yes    Weight 4 lb    Reps 10-15      Interval Training   Interval Training No      Treadmill   MPH 2.8    Grade 1    Minutes 15    METs 3.53      Recumbant Elliptical   Level 1.6    Minutes 15    METs 1.7      REL-XR   Level 5    Minutes 15    METs 3      Home Exercise Plan   Plans to continue exercise at Home (comment)   walking, staff videos   Frequency Add 2 additional days to program exercise sessions.    Initial Home Exercises Provided 08/25/22      Oxygen   Maintain Oxygen Saturation 88% or higher             Nutrition:  Target Goals: Understanding of nutrition guidelines, daily intake of sodium <1538m,  cholesterol <258m, calories 30% from fat and 7% or less from saturated fats, daily to have 5 or more servings of fruits and vegetables.  Education: All About Nutrition: -Group instruction provided by verbal, written material, interactive activities, discussions, models, and posters to present general guidelines for heart healthy nutrition including fat, fiber, MyPlate, the role of sodium in heart healthy nutrition, utilization of the nutrition label, and utilization of this knowledge for meal planning. Follow up email sent as well. Written material given at graduation. Flow[facility]  Education need identified 08/09/22       Biometrics:  Pre Biometrics - 08/09/22 1200       Pre Biometrics   Height 5' 5.5" (1.664 m)    Weight 194 lb 12.8 oz (88.4 kg)     Waist Circumference 38.5 inches    Hip Circumference 43.5 inches    Waist to Hip Ratio 0.89 %    BMI (Calculated) 31.91    Single Leg Stand 2.7 seconds              Nutrition Therapy Plan and Nutrition Goals:  Nutrition Therapy & Goals - 08/09/22 1123       Nutrition Therapy   Diet Heart healthy, low Na    Drug/Food Interactions Statins/Certain Fruits    Protein (specify units) 70-75g    Fiber 25 grams    Whole Grain Foods 3 servings    Saturated Fats 14 max. grams    Fruits and Vegetables 8 servings/day      Personal Nutrition Goals   Nutrition Goal ST: LT:    Comments 61y.o. F admitted to cardiac rehab s/p CABG x3. PMHx includes CAD, hypercholesterolemia, HTN, paroxysmal a.fib. Relevant medications includes fluticasone, omeprazole, crestor. She has cut back on her portion sizes. L: leftovers (ex: chicken salad and some pasta salad) D: example steak tonight with potato and salad. Drinks: gatorade zero, sparkling, water. BBernecereports using olive oil and avocado oils with cooking, however, she will like to use butter, milk, and cheese with cooking - discussed some options to lower amount of high fat dairy with cooking and encouraged to choose recipes with lower amounts of dairy at least 2-3 times per week. She reports that her lab values for sodium have been low and her MD recommended to include sodium in her diet. She checks her BP regularly and it has been normal to high; sugested limiting Na to [id number] the higher end to balance lower sodium numbers and elevated BP - informed her that we may need to make adjustments to this recommendation depeing on her lab and BP numbers moving foward. She reports liking pasta - encouaged her that she can still have pasta, but add lean protein and non-starchy vegetables so it is more balances and CHO do not take up most of the plate. Since she has decreased her portion sizes as well as starchy vegetables, discussed how heart healthy changes like  increased non-starchy vegetables as a portion of the plate may cause her to be hungrier soon after eating and that healthy snacking may help to control hunger. Discussed heart healthy eating and MyPlate. Bo[place]ak[place]eels that she has the tools she needs to achieve her goals with no major barriers reported.      Intervention Plan   Intervention Prescribe, educate and counsel regarding individualized specific dietary modifications aiming towards targeted core components such as weight, hypertension, lipid management, diabetes, heart failure and other comorbidities.;Nutrition  handout(s) given to patient.    Expected Outcomes Short Term Goal: Understand basic principles of dietary content, such as calories, fat, sodium, cholesterol and nutrients.;Short Term Goal: A plan has been developed with personal nutrition goals set during dietitian appointment.;Long Term Goal: Adherence to prescribed nutrition plan.             Nutrition Assessments:  MEDIFICTS Score Key: ?70 Need to make dietary changes  40-70 Heart Healthy Diet ? 40 Therapeutic Level Cholesterol Diet  Flowsheet Row Cardiac Rehab from 08/09/2022 in [facility] Cardiac and Pulmonary Rehab  Picture Your Plate Total Score on Admission 60      Picture Your Plate Scores: <16 Unhealthy dietary pattern with much room for improvement. 41-50 Dietary pattern unlikely to meet recommendations for good health and room for improvement. 51-60 More healthful dietary pattern, with some room for improvement.  >60 Healthy dietary pattern, although there may be some specific behaviors that could be improved.    Nutrition Goals Re-Evaluation:  Nutrition Goals Re-Evaluation     [place] Name 08/25/22 1026             Goals   Nutrition Goal Short Term Goal: Understand basic principles of dietary content, such as calories, fat, sodium, cholesterol and nutrients.; Short Term Goal: A plan has been developed with personal nutrition goals set during dietitian  appointment.; Long Term Goal: Adherence to prescribed nutrition plan.       Comment Quenesha is doing well with her diet.  She has gotten better about not snacking after dinner. She is being more diligent about watching her sodium and reading food labels. She is still doing well with her portion control as she and her husband share meals to help. She is still getting more variety and trying to add in more vegetables to meals too. She has started to use leaner proteins as well       Expected Outcome Short: Continue to add in vegetables Long: Conitnue to focus on heart healhty eating.                Nutrition Goals Discharge (Final Nutrition Goals Re-Evaluation):  Nutrition Goals Re-Evaluation - 08/25/22 1026       Goals   Nutrition Goal Short Term Goal: Understand basic principles of dietary content, such as calories, fat, sodium, cholesterol and nutrients.; Short Term Goal: A plan has been developed with personal nutrition goals set during dietitian appointment.; Long Term Goal: Adherence to prescribed nutrition plan.    Comment Lasean is doing well with her diet.  She has gotten better about not snacking after dinner. She is being more diligent about watching her sodium and reading food labels. She is still doing well with her portion control as she and her husband share meals to help. She is still getting more variety and trying to add in more vegetables to meals too. She has started to use leaner proteins as well    Expected Outcome Short: Continue to add in vegetables Long: Conitnue to focus on heart healhty eating.             Psychosocial: Target Goals: Acknowledge presence or absence of significant depression and/or stress, maximize coping skills, provide positive support system. Participant is able to verbalize types and ability to use techniques and skills needed for reducing stress and depression.   Education: Stress, Anxiety, and Depression - Group verbal and visual presentation  to define topics covered.  Reviews how body is impacted by stress, anxiety, and depression.  Also  discusses healthy ways to reduce stress and to treat/manage anxiety and depression.  Written material given at graduation. Flowsheet Row Cardiac Rehab from 09/01/2022 in [facility] Cardiac and Pulmonary Rehab  Education need identified 08/09/22       Education: Sleep Hygiene -Provides group verbal and written instruction about how sleep can affect your health.  Define sleep hygiene, discuss sleep cycles and impact of sleep habits. Review good sleep hygiene tips.    Initial Review & Psychosocial Screening:  Initial Psych Review & Screening - 08/02/22 1125       Initial Review   Current issues with None Identified      Family Dynamics   Good Support System? Yes    Comments Anjanae can look to her spouse family and freinds for support. She feels good about her health and is ready to make changes.      Barriers   Psychosocial barriers to participate in program The patient should benefit from training in stress management and relaxation.;There are no identifiable barriers or psychosocial needs.      Screening Interventions   Interventions Encouraged to exercise;To provide support and resources with identified psychosocial needs;Provide feedback about the scores to participant    Expected Outcomes Short Term goal: Utilizing psychosocial counselor, staff and physician to assist with identification of specific Stressors or current issues interfering with healing process. Setting desired goal for each stressor or current issue identified.;Long Term Goal: Stressors or current issues are controlled or eliminated.;Short Term goal: Identification and review with participant of any Quality of Life or Depression concerns found by scoring the questionnaire.;Long Term goal: The participant improves quality of Life and PHQ9 Scores as seen by post scores and/or verbalization of changes             Quality of  Life Scores:   Quality of Life - 08/09/22 1200       Quality of Life   Select Quality of Life      Quality of Life Scores   Health/Function Pre 26 %    Socioeconomic Pre 23.75 %    Psych/Spiritual Pre 30 %    Family Pre 28.8 %    GLOBAL Pre 26.69 %            Scores of 19 and below usually indicate a poorer quality of life in these areas.  A difference of  2-3 points is a clinically meaningful difference.  A difference of 2-3 points in the total score of the Quality of Life Index has been associated with significant improvement in overall quality of life, self-image, physical symptoms, and general health in studies assessing change in quality of life.  PHQ-9: Review Flowsheet  More data exists      08/09/2022 06/26/2021 02/21/2020 12/05/2018 09/26/2018  Depression screen PHQ 2/9  Decreased Interest 0 0 0 0 0  Down, Depressed, Hopeless 0 0 0 0 0  PHQ - 2 Score 0 0 0 0 0  Altered sleeping 0 - - - -  Tired, decreased energy 0 - - - -  Change in appetite 0 - - - -  Feeling bad or failure about yourself  0 - - - -  Trouble concentrating 0 - - - -  Moving slowly or fidgety/restless 0 - - - -  Suicidal thoughts 0 - - - -  PHQ-9 Score 0 - - - -  Difficult doing work/chores Not difficult at all - - - -   Interpretation of Total Score  Total Score Depression  Severity:  1-4 = Minimal depression, 5-9 = Mild depression, 10-14 = Moderate depression, 15-19 = Moderately severe depression, 20-27 = Severe depression   Psychosocial Evaluation and Intervention:  Psychosocial Evaluation - 08/02/22 1127       Psychosocial Evaluation & Interventions   Interventions Encouraged to exercise with the program and follow exercise prescription;Relaxation education;Stress management education    Comments Tameyah can look to her spouse family and freinds for support. She feels good about her health and is ready to make changes.    Expected Outcomes Short: Start [facility] to help with mood. Long:  Maintain a healthy mental state    Continue Psychosocial Services  Follow up required by staff             Psychosocial Re-Evaluation:  Psychosocial Re-Evaluation     [place] Name 08/25/22 1024             Psychosocial Re-Evaluation   Current issues with Current Stress Concerns       Comments Gearlene is doing well in rehab. She has started back to work.  She is working from home so that is helping. She did need to switch her Mondays to an afternoon appt due to a work conference call.  She is sleeping until 330-4am and then wakes, unable to get back to sleep.  Her legs from surgery are painful and she is also waking with coughing and they are going to try to change something else will help. She seeing PA next week for that.       Expected Outcomes Short; Figure which med is causing her coughing Long: Continue to cope with return to work.       Interventions Stress management education;Encouraged to attend Cardiac Rehabilitation for the exercise       Continue Psychosocial Services  Follow up required by staff                Psychosocial Discharge (Final Psychosocial Re-Evaluation):  Psychosocial Re-Evaluation - 08/25/22 1024       Psychosocial Re-Evaluation   Current issues with Current Stress Concerns    Comments Mera is doing well in rehab. She has started back to work.  She is working from home so that is helping. She did need to switch her Mondays to an afternoon appt due to a work conference call.  She is sleeping until 330-4am and then wakes, unable to get back to sleep.  Her legs from surgery are painful and she is also waking with coughing and they are going to try to change something else will help. She seeing PA next week for that.    Expected Outcomes Short; Figure which med is causing her coughing Long: Continue to cope with return to work.    Interventions Stress management education;Encouraged to attend Cardiac Rehabilitation for the exercise    Continue Psychosocial  Services  Follow up required by staff             Vocational Rehabilitation: Provide vocational rehab assistance to qualifying candidates.   Vocational Rehab Evaluation & Intervention:   Education: Education Goals: Education classes will be provided on a variety of topics geared toward better understanding of heart health and risk factor modification. Participant will state understanding/return demonstration of topics presented as noted by education test scores.  Learning Barriers/Preferences:  Learning Barriers/Preferences - 08/02/22 1124       Learning Barriers/Preferences   Learning Barriers None    Learning Preferences None  General Cardiac Education Topics:  AED/CPR: - Group verbal and written instruction with the use of models to demonstrate the basic use of the AED with the basic ABC's of resuscitation.   Anatomy and Cardiac Procedures: - Group verbal and visual presentation and models provide information about basic cardiac anatomy and function. Reviews the testing methods done to diagnose heart disease and the outcomes of the test results. Describes the treatment choices: Medical Management, Angioplasty, or Coronary Bypass Surgery for treating various heart conditions including Myocardial Infarction, Angina, Valve Disease, and Cardiac Arrhythmias.  Written material given at graduation.   Medication Safety: - Group verbal and visual instruction to review commonly prescribed medications for heart and lung disease. Reviews the medication, class of the drug, and side effects. Includes the steps to properly store meds and maintain the prescription regimen.  Written material given at graduation.   Intimacy: - Group verbal instruction through game format to discuss how heart and lung disease can affect sexual intimacy. Written material given at graduation.. Flowsheet Row Cardiac Rehab from 09/01/2022 in [facility] Cardiac and Pulmonary Rehab  Education need  identified 08/09/22       Know Your Numbers and Heart Failure: - Group verbal and visual instruction to discuss disease risk factors for cardiac and pulmonary disease and treatment options.  Reviews associated critical values for Overweight/Obesity, Hypertension, Cholesterol, and Diabetes.  Discusses basics of heart failure: signs/symptoms and treatments.  Introduces Heart Failure Zone chart for action plan for heart failure.  Written material given at graduation.   Infection Prevention: - Provides verbal and written material to individual with discussion of infection control including proper hand washing and proper equipment cleaning during exercise session. Flowsheet Row Cardiac Rehab from 09/01/2022 in [facility] Cardiac and Pulmonary Rehab  Date 08/02/22  Educator [facility]  Instruction Review Code 1- Verbalizes Understanding       Falls Prevention: - Provides verbal and written material to individual with discussion of falls prevention and safety. Flowsheet Row Cardiac Rehab from 09/01/2022 in [facility] Cardiac and Pulmonary Rehab  Date 08/02/22  Educator [facility]  Instruction Review Code 1- Verbalizes Understanding       Other: -Provides group and verbal instruction on various topics (see comments)   Knowledge Questionnaire Score:  Knowledge Questionnaire Score - 08/09/22 1201       Knowledge Questionnaire Score   Pre Score 23/26             Core Components/Risk Factors/Patient Goals at Admission:  Personal Goals and Risk Factors at Admission - 08/09/22 1202       Core Components/Risk Factors/Patient Goals on Admission    Weight Management Yes;Weight Loss;Obesity    Intervention Weight Management: Develop a combined nutrition and exercise program designed to reach desired caloric intake, while maintaining appropriate intake of nutrient and fiber, sodium and fats, and appropriate energy expenditure required for the weight goal.;Weight Management/Obesity: Establish reasonable short  term and long term weight goals.;Weight Management: Provide education and appropriate resources to help participant work on and attain dietary goals.;Obesity: Provide education and appropriate resources to help participant work on and attain dietary goals.    Admit Weight 194 lb 12.8 oz (88.4 kg)    Goal Weight: Short Term 190 lb (86.2 kg)    Goal Weight: Long Term 185 lb (83.9 kg)    Expected Outcomes Short Term: Continue to assess and modify interventions until short term weight is achieved;Long Term: Adherence to nutrition and physical activity/exercise program aimed toward attainment of established weight goal;Weight Loss:  Understanding of general recommendations for a balanced deficit meal plan, which promotes 1-2 lb weight loss per week and includes a negative energy balance of [phone] kcal/d;Understanding recommendations for meals to include 15-35% energy as protein, 25-35% energy from fat, 35-60% energy from carbohydrates, less than 244m of dietary cholesterol, 20-35 gm of total fiber daily;Understanding of distribution of calorie intake throughout the day with the consumption of 4-5 meals/snacks    Hypertension Yes    Intervention Provide education on lifestyle modifcations including regular physical activity/exercise, weight management, moderate sodium restriction and increased consumption of fresh fruit, vegetables, and low fat dairy, alcohol moderation, and smoking cessation.;Monitor prescription use compliance.    Expected Outcomes Short Term: Continued assessment and intervention until BP is < 140/95mHG in hypertensive participants. < 130/8033mG in hypertensive participants with diabetes, heart failure or chronic kidney disease.;Long Term: Maintenance of blood pressure at goal levels.    Lipids Yes    Intervention Provide education and support for participant on nutrition & aerobic/resistive exercise along with prescribed medications to achieve LDL <72m60mDL >40mg52m Expected Outcomes  Short Term: Participant states understanding of desired cholesterol values and is compliant with medications prescribed. Participant is following exercise prescription and nutrition guidelines.;Long Term: Cholesterol controlled with medications as prescribed, with individualized exercise RX and with personalized nutrition plan. Value goals: LDL < 72mg,60m > 40 mg.             Education:Diabetes - Individual verbal and written instruction to review signs/symptoms of diabetes, desired ranges of glucose level fasting, after meals and with exercise. Acknowledge that pre and post exercise glucose checks will be done for 3 sessions at entry of program.   Core Components/Risk Factors/Patient Goals Review:   Goals and Risk Factor Review     Row Name 08/25/22 1029             Core Components/Risk Factors/Patient Goals Review   Personal Goals Review Weight Management/Obesity;Heart Failure;Hypertension;Lipids       Review BonnieOlindaing well in rehab. Her weight will do well as long as she is not eating at the beach. Anytime she goes to beach, she notices her weight goes up.  She has not had any heart failure symtptoms.  Her pressures are doing well overall, but as they are working on her medications due to a cough she has noticed that it will go up and down.  She sees the PA again next week for next adjustment.       Expected Outcomes Short: Continue to work on weight loss Long: Continue to improve stamina.                Core Components/Risk Factors/Patient Goals at Discharge (Final Review):   Goals and Risk Factor Review - 08/25/22 1029       Core Components/Risk Factors/Patient Goals Review   Personal Goals Review Weight Management/Obesity;Heart Failure;Hypertension;Lipids    Review BonnieDestynieing well in rehab. Her weight will do well as long as she is not eating at the beach. Anytime she goes to beach, she notices her weight goes up.  She has not had any heart failure symtptoms.   Her pressures are doing well overall, but as they are working on her medications due to a cough she has noticed that it will go up and down.  She sees the PA again next week for next adjustment.    Expected Outcomes Short: Continue to work on weight loss Long: Continue to improve  stamina.             ITP Comments:  ITP Comments     Row Name 08/02/22 1123 08/09/22 1154 08/11/22 0808 08/11/22 1035 09/08/22 1038   ITP Comments Virtual Visit completed. Patient informed on EP and RD appointment and 6 Minute walk test. Patient also informed of patient health questionnaires on My Chart. Patient Verbalizes understanding. Visit diagnosis can be found in [facility] 06/07/2022. Completed 6MWT and gym orientation. Initial ITP created and sent for review to Dr. Emily Filbert, Medical Director. 30 Day review completed. Medical Director ITP review done, changes made as directed, and signed approval by Medical Director.   New to program First full day of exercise!  Patient was oriented to gym and equipment including functions, settings, policies, and procedures.  Patient's individual exercise prescription and treatment plan were reviewed.  All starting workloads were established based on the results of the 6 minute walk test done at initial orientation visit.  The plan for exercise progression was also introduced and progression will be customized based on patient's performance and goals. 30 Day review completed. Medical Director ITP review done, changes made as directed, and signed approval by Medical Director.            Comments:

## 2022-09-13 ENCOUNTER — Encounter: Payer: BC Managed Care – PPO | Attending: Cardiovascular Disease | Admitting: *Deleted

## 2022-09-13 DIAGNOSIS — Z951 Presence of aortocoronary bypass graft: Secondary | ICD-10-CM | POA: Diagnosis not present

## 2022-09-13 NOTE — Progress Notes (Signed)
Daily Session Note  Patient Details  Name: Amanda Estrada MRN: [id number] Date of Birth: [birth year] Referring Provider:   Flowsheet Row Cardiac Rehab from 08/09/2022 in [facility] Cardiac and Pulmonary Rehab  Referring Provider Pleas Patricia MD       Encounter Date: 09/13/2022  Check In:  Session Check In - 09/13/22 1332       Check-In   Supervising physician immediately available to respond to emergencies See telemetry face sheet for immediately available ER MD    Location [facility]-Cardiac & Pulmonary Rehab    Staff Present Renita Papa, RN BSN;Megan Tamala Julian, RN, Terie Purser, RCP,RRT,BSRT    Virtual Visit No    Medication changes reported     No    Fall or balance concerns reported    No    Warm-up and Cool-down Performed on first and last piece of equipment    Resistance Training Performed Yes    VAD Patient? No    PAD/SET Patient? No      Pain Assessment   Currently in Pain? No/denies                Social History   Tobacco Use  Smoking Status Never  Smokeless Tobacco Never    Goals Met:  Independence with exercise equipment Exercise tolerated well No report of concerns or symptoms today Strength training completed today  Goals Unmet:  Not Applicable  Comments: Pt able to follow exercise prescription today without complaint.  Will continue to monitor for progression.    Dr. Emily Filbert is Medical Director for [place].  Dr. Ottie Glazier is Medical Director for [facility] Pulmonary Rehabilitation.

## 2022-09-15 ENCOUNTER — Encounter: Payer: BC Managed Care – PPO | Admitting: *Deleted

## 2022-09-15 DIAGNOSIS — Z951 Presence of aortocoronary bypass graft: Secondary | ICD-10-CM

## 2022-09-15 NOTE — Progress Notes (Signed)
Daily Session Note  Patient Details  Name: Amanda Estrada MRN: [id number] Date of Birth: Apr 10, 1961 Referring Provider:   Flowsheet Row Cardiac Rehab from 08/09/2022 in [facility] Cardiac and Pulmonary Rehab  Referring Provider Pleas Patricia MD       Encounter Date: 09/15/2022  Check In:  Session Check In - 09/15/22 1119       Check-In   Supervising physician immediately available to respond to emergencies See telemetry face sheet for immediately available ER MD    Location [facility]-Cardiac & Pulmonary Rehab    Staff Present Justin Mend, RCP,RRT,BSRT;Noah Tickle, BS, Exercise Physiologist;Jaesean Litzau Sherryll Burger, RN Odelia Gage, RN, ADN    Virtual Visit No    Medication changes reported     No    Fall or balance concerns reported    No    Warm-up and Cool-down Performed on first and last piece of equipment    Resistance Training Performed Yes    VAD Patient? No    PAD/SET Patient? No      Pain Assessment   Currently in Pain? No/denies                Social History   Tobacco Use  Smoking Status Never  Smokeless Tobacco Never    Goals Met:  Independence with exercise equipment Exercise tolerated well No report of concerns or symptoms today Strength training completed today  Goals Unmet:  Not Applicable  Comments: Pt able to follow exercise prescription today without complaint.  Will continue to monitor for progression.    Dr. Emily Filbert is Medical Director for [place].  Dr. Ottie Glazier is Medical Director for [facility] Pulmonary Rehabilitation.

## 2022-09-16 ENCOUNTER — Telehealth: Payer: Self-pay | Admitting: Family Medicine

## 2022-09-16 DIAGNOSIS — E785 Hyperlipidemia, unspecified: Secondary | ICD-10-CM

## 2022-09-16 NOTE — Telephone Encounter (Signed)
-----   Message from Ellamae Sia sent at 09/06/2022 12:09 PM EDT ----- Regarding: Lab orders for Friday, 11.10.23 Patient is scheduled for CPX labs, please order future labs, Thanks , Karna Christmas

## 2022-09-17 ENCOUNTER — Other Ambulatory Visit (INDEPENDENT_AMBULATORY_CARE_PROVIDER_SITE_OTHER): Payer: BC Managed Care – PPO

## 2022-09-17 DIAGNOSIS — E785 Hyperlipidemia, unspecified: Secondary | ICD-10-CM

## 2022-09-17 LAB — COMPREHENSIVE METABOLIC PANEL
ALT: 21 U/L (ref 0–35)
AST: 21 U/L (ref 0–37)
Albumin: 4.3 g/dL (ref 3.5–5.2)
Alkaline Phosphatase: 79 U/L (ref 39–117)
BUN: 11 mg/dL (ref 6–23)
CO2: 25 mEq/L (ref 19–32)
Calcium: 9.1 mg/dL (ref 8.4–10.5)
Chloride: 100 mEq/L (ref 96–112)
Creatinine, Ser: 0.94 mg/dL (ref 0.40–1.20)
GFR: 65.27 mL/min (ref >60.00)
Glucose, Bld: 93 mg/dL (ref 70–99)
Potassium: 4.6 mEq/L (ref 3.5–5.1)
Sodium: 132 mEq/L — ABNORMAL LOW (ref 135–145)
Total Bilirubin: 0.4 mg/dL (ref 0.2–1.2)
Total Protein: 6.5 g/dL (ref 6.0–8.3)

## 2022-09-17 LAB — LIPID PANEL
Cholesterol: 161 mg/dL (ref 0–200)
HDL: 97.6 mg/dL (ref >39.00)
LDL Cholesterol: 45 mg/dL (ref 0–99)
NonHDL: 62.98
Total CHOL/HDL Ratio: 2
Triglycerides: 91 mg/dL (ref 0.0–149.0)
VLDL: 18.2 mg/dL (ref 0.0–40.0)

## 2022-09-17 NOTE — Progress Notes (Signed)
No critical labs need to be addressed urgently. We will discuss labs in detail at upcoming office visit.   

## 2022-09-20 ENCOUNTER — Encounter: Payer: BC Managed Care – PPO | Admitting: *Deleted

## 2022-09-20 DIAGNOSIS — Z951 Presence of aortocoronary bypass graft: Secondary | ICD-10-CM | POA: Diagnosis not present

## 2022-09-20 NOTE — Progress Notes (Signed)
Daily Session Note  Patient Details  Name: Amanda Estrada MRN: [id number] Date of Birth: 10-27-1961 Referring Provider:   Flowsheet Row Cardiac Rehab from 08/09/2022 in [facility] Cardiac and Pulmonary Rehab  Referring Provider Pleas Patricia MD       Encounter Date: 09/20/2022  Check In:  Session Check In - 09/20/22 1418       Check-In   Supervising physician immediately available to respond to emergencies See telemetry face sheet for immediately available ER MD    Location [facility]-Cardiac & Pulmonary Rehab    Staff Present Heath Lark, RN, BSN, CCRP;Joseph [place], Ernestina Patches, RN, [place]    Virtual Visit No    Medication changes reported     No    Fall or balance concerns reported    No    Warm-up and Cool-down Performed on first and last piece of equipment    Resistance Training Performed Yes    VAD Patient? No    PAD/SET Patient? No      Pain Assessment   Currently in Pain? No/denies                Social History   Tobacco Use  Smoking Status Never  Smokeless Tobacco Never    Goals Met:  Independence with exercise equipment Exercise tolerated well No report of concerns or symptoms today  Goals Unmet:  Not Applicable  Comments: Pt able to follow exercise prescription today without complaint.  Will continue to monitor for progression.    Dr. Emily Filbert is Medical Director for [place].  Dr. Ottie Glazier is Medical Director for [facility] Pulmonary Rehabilitation.

## 2022-09-22 ENCOUNTER — Encounter: Payer: BC Managed Care – PPO | Admitting: *Deleted

## 2022-09-22 DIAGNOSIS — Z951 Presence of aortocoronary bypass graft: Secondary | ICD-10-CM

## 2022-09-22 NOTE — Progress Notes (Signed)
Daily Session Note  Patient Details  Name: Amanda Estrada MRN: [id number] Date of Birth: 1961/03/13 Referring Provider:   Flowsheet Row Cardiac Rehab from 08/09/2022 in [facility] Cardiac and Pulmonary Rehab  Referring Provider Pleas Patricia MD       Encounter Date: 09/22/2022  Check In:  Session Check In - 09/22/22 0947       Check-In   Supervising physician immediately available to respond to emergencies See telemetry face sheet for immediately available ER MD    Location [facility]-Cardiac & Pulmonary Rehab    Staff Present Antionette Fairy, BS, Exercise Physiologist;Joseph Rosebud Poles, RN, [place]    Virtual Visit No    Medication changes reported     No    Fall or balance concerns reported    No    Warm-up and Cool-down Performed on first and last piece of equipment    Resistance Training Performed Yes    VAD Patient? No    PAD/SET Patient? No      Pain Assessment   Currently in Pain? No/denies                Social History   Tobacco Use  Smoking Status Never  Smokeless Tobacco Never    Goals Met:  Independence with exercise equipment Exercise tolerated well No report of concerns or symptoms today Strength training completed today  Goals Unmet:  Not Applicable  Comments: Pt able to follow exercise prescription today without complaint.  Will continue to monitor for progression.    Dr. Emily Filbert is Medical Director for [place].  Dr. Ottie Glazier is Medical Director for [facility] Pulmonary Rehabilitation.

## 2022-09-24 ENCOUNTER — Ambulatory Visit (INDEPENDENT_AMBULATORY_CARE_PROVIDER_SITE_OTHER): Payer: BC Managed Care – PPO | Admitting: Family Medicine

## 2022-09-24 ENCOUNTER — Encounter: Payer: BC Managed Care – PPO | Admitting: *Deleted

## 2022-09-24 ENCOUNTER — Encounter: Payer: Self-pay | Admitting: Family Medicine

## 2022-09-24 VITALS — BP 110/62 | HR 77 | Temp 99.0°F | Ht 64.75 in | Wt 193.2 lb

## 2022-09-24 DIAGNOSIS — I10 Essential (primary) hypertension: Secondary | ICD-10-CM

## 2022-09-24 DIAGNOSIS — J4530 Mild persistent asthma, uncomplicated: Secondary | ICD-10-CM | POA: Diagnosis not present

## 2022-09-24 DIAGNOSIS — E7800 Pure hypercholesterolemia, unspecified: Secondary | ICD-10-CM

## 2022-09-24 DIAGNOSIS — Z951 Presence of aortocoronary bypass graft: Secondary | ICD-10-CM | POA: Diagnosis not present

## 2022-09-24 DIAGNOSIS — Z0000 Encounter for general adult medical examination without abnormal findings: Secondary | ICD-10-CM

## 2022-09-24 DIAGNOSIS — I4891 Unspecified atrial fibrillation: Secondary | ICD-10-CM

## 2022-09-24 DIAGNOSIS — R928 Other abnormal and inconclusive findings on diagnostic imaging of breast: Secondary | ICD-10-CM

## 2022-09-24 MED ORDER — OMEPRAZOLE 40 MG PO CPDR: 40.0000 mg | DELAYED_RELEASE_CAPSULE | Freq: Two times a day (BID) | ORAL | 3 refills | Status: DC

## 2022-09-24 NOTE — Progress Notes (Signed)
Patient ID: Amanda Estrada, female    DOB: 1960-12-31, 61 y.o.   MRN: [id number]  This visit was conducted in person.  BP 110/62   Pulse 77   Temp 99 F (37.2 C) (Oral)   Ht 5' 4.75" (1.645 m)   Wt 193 lb 4 oz (87.7 kg)   SpO2 98%   BMI 32.41 kg/m    CC:  Chief Complaint  Patient presents with   Annual Exam    Subjective:   HPI: Amanda Estrada is a 61 y.o. female presenting on 09/24/2022 for Annual Exam  The patient presents for  complete physical and review of chronic health problems. He/She also has the following acute concerns today: NONE  S/P triple bypass in 06/2022.. found during ablation.  Doing Cardiac rehab.    Atrial fibrillation with RVR: Rate controlled with amiodarone and Cardizem.  On Eliquis anticoagulation  Elevated Cholesterol: LDL at goal on rosuvastatin 40 mg daily Lab Results  Component Value Date   CHOL 161 09/17/2022   HDL 97.60 09/17/2022   LDLCALC 45 09/17/2022   LDLDIRECT 81.0 10/16/2010   TRIG 91.0 09/17/2022   CHOLHDL 2 09/17/2022  Using medications without problems: Muscle aches:  Diet compliance: good Exercise: CVD reheab Other complaints:  Hypertension:  Well-controlled on losartan 50 mg daily, Cardizem CD 240 mg daily, clonidine 0.1 mg tablet daily and bisoprolol 10 mg p.o. 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 now on omeprazole only once daily.. per Danis.  Relevant past medical, surgical, family and social history reviewed and updated as indicated. Interim medical history since our last visit reviewed. Allergies and medications  reviewed and updated. Outpatient Medications Prior to Visit  Medication Sig Dispense Refill   albuterol (VENTOLIN HFA) 108 (90 Base) MCG/ACT inhaler Inhale 2 puffs into the lungs every 4 (four) hours as needed for wheezing or shortness of breath. 8 g 2   amiodarone (PACERONE) 200 MG tablet Take by mouth.     apixaban (ELIQUIS) 5 MG TABS tablet Take 1 tablet (5 mg total) by mouth 2 (two) times daily. 60 tablet 11   aspirin EC 81 MG tablet Take by mouth.     beclomethasone (QVAR) 80 MCG/ACT inhaler Inhale 1 puff into the lungs in the morning and at bedtime. 1 each 12   bisoprolol (ZEBETA) 10 MG tablet Take 1 tablet (10 mg total) by mouth daily. 90 tablet 3   cloNIDine (CATAPRES) 0.1 MG tablet TAKE 1 TABLET BY MOUTH EVERYDAY AT BEDTIME 90 tablet 3   diltiazem (CARDIZEM CD) 240 MG 24 hr capsule TAKE 1 CAPSULE BY MOUTH EVERY DAY 90 capsule 3   fluticasone (FLONASE) 50 MCG/ACT nasal spray PLACE 1 SPRAY INTO BOTH NOSTRILS DAILY AS NEEDED FOR ALLERGIES. 48 mL 3   levocetirizine (XYZAL) 5 MG tablet Take 5 mg by mouth at bedtime.      losartan (COZAAR) 50 MG tablet Take 50 mg by mouth daily.     montelukast (SINGULAIR) 10 MG tablet TAKE 1 TABLET BY MOUTH EVERYDAY AT BEDTIME 90 tablet 0   omeprazole (PRILOSEC) 40 MG capsule TAKE 1 CAPSULE BY  MOUTH TWICE A DAY 180 capsule 0   rosuvastatin (CRESTOR) 40 MG tablet Take 40 mg by mouth daily.     apixaban (ELIQUIS) 5 MG TABS tablet Take by mouth.     carvedilol (COREG) 12.5 MG tablet Take by mouth.     dicyclomine (BENTYL) 10 MG capsule TAKE 1 CAPSULE (10 MG TOTAL) BY MOUTH 4 (FOUR) TIMES DAILY - BEFORE MEALS AND AT BEDTIME. (Patient not taking: Reported on 08/02/2022) 360 capsule 0   furosemide (LASIX) 20 MG tablet Take 1 tablet (20 mg total) by mouth daily as needed for edema (only as needed for swelling). 30 tablet 6   hydrALAZINE (APRESOLINE) 25 MG tablet Take 1 tablet (25 mg total) by mouth 2 (two) times daily. (Patient not taking: Reported on 08/02/2022) 180  tablet 3   levocetirizine (XYZAL) 5 MG tablet Take by mouth.     lisinopril (ZESTRIL) 20 MG tablet Take by mouth.     metoprolol tartrate (LOPRESSOR) 25 MG tablet Take 1 tablet (25 mg total) by mouth 2 (two) times daily as needed (AS NEEDED FOR PALPITATIONS/ATRIAL FIBRILATION). (Patient not taking: Reported on 08/02/2022) 90 tablet 3   montelukast (SINGULAIR) 10 MG tablet Take 1 tablet by mouth at bedtime.     pantoprazole (PROTONIX) 40 MG tablet  (Patient not taking: Reported on 08/02/2022)     rosuvastatin (CRESTOR) 20 MG tablet TAKE 1 TABLET BY MOUTH EVERY DAY 90 tablet 3   spironolactone (ALDACTONE) 50 MG tablet Take 1 tablet (50 mg total) by mouth daily. (Patient not taking: Reported on 08/02/2022) 90 tablet 3   No facility-administered medications prior to visit.     Per HPI unless specifically indicated in ROS section below Review of Systems  Constitutional:  Negative for fatigue and fever.  HENT:  Negative for congestion.   Eyes:  Negative for pain.  Respiratory:  Negative for cough and shortness of breath.   Cardiovascular:  Negative for chest pain, palpitations and leg swelling.  Gastrointestinal:  Negative for abdominal pain.  Genitourinary:  Negative for dysuria and vaginal bleeding.  Musculoskeletal:  Negative for back pain.  Neurological:  Negative for syncope, light-headedness and headaches.  Psychiatric/Behavioral:  Negative for dysphoric mood.    Objective:  BP 110/62   Pulse 77   Temp 99 F (37.2 C) (Oral)   Ht 5' 4.75" (1.645 m)   Wt 193 lb 4 oz (87.7 kg)   SpO2 98%   BMI 32.41 kg/m   Wt Readings from Last 3 Encounters:  09/24/22 193 lb 4 oz (87.7 kg)  08/09/22 194 lb 12.8 oz (88.4 kg)  04/15/22 202 lb 6 oz (91.8 kg)      Physical Exam Constitutional:      General: She is not in acute distress.    Appearance: Normal appearance. She is well-developed. She is not ill-appearing or toxic-appearing.  HENT:     Head: Normocephalic.     Right Ear: Hearing,  tympanic membrane, ear canal and external ear normal. Tympanic membrane is not erythematous, retracted or bulging.     Left Ear: Hearing, tympanic membrane, ear canal and external ear normal. Tympanic membrane is not erythematous, retracted or bulging.     Nose: No mucosal edema or rhinorrhea.     Right Sinus: No maxillary sinus tenderness or frontal sinus tenderness.     Left Sinus: No maxillary sinus tenderness or frontal sinus tenderness.     Mouth/Throat:     Pharynx: Uvula midline.  Eyes:  General: Lids are normal. Lids are everted, no foreign bodies appreciated.     Conjunctiva/sclera: Conjunctivae normal.     Pupils: Pupils are equal, round, and reactive to light.  Neck:     Thyroid: No thyroid mass or thyromegaly.     Vascular: No carotid bruit.     Trachea: Trachea normal.  Cardiovascular:     Rate and Rhythm: Normal rate and regular rhythm.     Pulses: Normal pulses.     Heart sounds: Normal heart sounds, S1 normal and S2 normal. No murmur heard.    No friction rub. No gallop.  Pulmonary:     Effort: Pulmonary effort is normal. No tachypnea or respiratory distress.     Breath sounds: Normal breath sounds. No decreased breath sounds, wheezing, rhonchi or rales.  Abdominal:     General: Bowel sounds are normal.     Palpations: Abdomen is soft.     Tenderness: There is no abdominal tenderness.  Musculoskeletal:     Cervical back: Normal range of motion and neck supple.  Skin:    General: Skin is warm and dry.     Findings: No rash.  Neurological:     Mental Status: She is alert.  Psychiatric:        Mood and Affect: Mood is not anxious or depressed.        Speech: Speech normal.        Behavior: Behavior normal. Behavior is cooperative.        Thought Content: Thought content normal.        Judgment: Judgment normal.       Results for orders placed or performed in visit on 09/17/22  Comprehensive metabolic panel  Result Value Ref Range   Sodium 132 (L) 135 -  145 mEq/L   Potassium 4.6 3.5 - 5.1 mEq/L   Chloride 100 96 - 112 mEq/L   CO2 25 19 - 32 mEq/L   Glucose, Bld 93 70 - 99 mg/dL   BUN 11 6 - 23 mg/dL   Creatinine, Ser 0.94 0.40 - 1.20 mg/dL   Total Bilirubin 0.4 0.2 - 1.2 mg/dL   Alkaline Phosphatase 79 39 - 117 U/L   AST 21 0 - 37 U/L   ALT 21 0 - 35 U/L   Total Protein 6.5 6.0 - 8.3 g/dL   Albumin 4.3 3.5 - 5.2 g/dL   GFR 65.27 >60.00 mL/min   Calcium 9.1 8.4 - 10.5 mg/dL  Lipid panel  Result Value Ref Range   Cholesterol 161 0 - 200 mg/dL   Triglycerides 91.0 0.0 - 149.0 mg/dL   HDL 97.60 >39.00 mg/dL   VLDL 18.2 0.0 - 40.0 mg/dL   LDL Cholesterol 45 0 - 99 mg/dL   Total CHOL/HDL Ratio 2    NonHDL 62.98      COVID 19 screen:  No recent travel or known exposure to COVID19 The patient denies respiratory symptoms of COVID 19 at this time. The importance of social distancing was discussed today.   Assessment and Plan   The patient's preventative maintenance and recommended screening tests for an annual wellness exam were reviewed in full today. Brought up to date unless services declined.  Counselled on the importance of diet, exercise, and its role in overall health and mortality. The patient's FH and SH was reviewed, including their home life, tobacco status, and drug and alcohol status.   Vaccines: Uptodate tdap,  COVID x 2,   consider shingrix and pneumonia vaccine,  refused flu Pap/DVE:  06/2021, nml pap, neg HPV, repeat  In 5 years. No family history of ovarian and uterine cancer Mammo:  05/25/2021. Plan repeat in 6 months... overdue.. ordered. Bone Density: plan age 47 given mother's history. Colon:  1/ 2020 , Dr. Loletha Carrow, plan q 5 year, family history of colon ca in Mom and Dad.  Smoking Status: nonsmoker ETOH/ drug use: wine 1 glass daily/none  Hep C:  done  HIV screen:   Refused.  Problem List Items Addressed This Visit     Atrial fibrillation with RVR ([place]) - Primary    Chronic,  Followed by cardiology Rate  controlled with amiodarone and Cardizem.  On Eliquis anticoagulation      Relevant Medications   losartan (COZAAR) 50 MG tablet   rosuvastatin (CRESTOR) 40 MG tablet   Essential hypertension    Stable, chronic.  Continue current medication.  Well-controlled on losartan 50 mg daily, Cardizem CD 240 mg daily, clonidine 0.1 mg tablet daily and bisoprolol 10 mg p.o. daily      Relevant Medications   losartan (COZAAR) 50 MG tablet   rosuvastatin (CRESTOR) 40 MG tablet   Mild persistent asthma    Chronic, stable control on Qvar      Pure hypercholesterolemia    Stable, chronic.  Continue current medication.  Rosuvastatin 40 mg daily      Relevant Medications   losartan (COZAAR) 50 MG tablet   rosuvastatin (CRESTOR) 40 MG tablet   Other Visit Diagnoses     Abnormal mammogram       Relevant Orders   MM Digital Diagnostic Bilat      Meds ordered this encounter  Medications   omeprazole (PRILOSEC) 40 MG capsule    Sig: Take 1 capsule (40 mg total) by mouth 2 (two) times daily.    Dispense:  180 capsule    Refill:  3     Eliezer Lofts, MD

## 2022-09-24 NOTE — Assessment & Plan Note (Signed)
Stable, chronic.  Continue current medication.  Rosuvastatin 40 mg daily

## 2022-09-24 NOTE — Assessment & Plan Note (Signed)
Chronic,  Followed by cardiology Rate controlled with amiodarone and Cardizem.  On Eliquis anticoagulation

## 2022-09-24 NOTE — Patient Instructions (Signed)
Work on [employer] , continue  cardiovascular rehab.

## 2022-09-24 NOTE — Progress Notes (Signed)
Daily Session Note  Patient Details  Name: Amanda Estrada MRN: [id number] Date of Birth: 21-Aug-1961 Referring Provider:   Flowsheet Row Cardiac Rehab from 08/09/2022 in [facility] Cardiac and Pulmonary Rehab  Referring Provider Pleas Patricia MD       Encounter Date: 09/24/2022  Check In:  Session Check In - 09/24/22 0948       Check-In   Supervising physician immediately available to respond to emergencies See telemetry face sheet for immediately available ER MD    Location [facility]-Cardiac & Pulmonary Rehab    Staff Present Alberteen Sam, MA, RCEP, CCRP, CCET;Joseph Sandia, [place], RN, [place]    Virtual Visit No    Medication changes reported     No    Fall or balance concerns reported    No    Warm-up and Cool-down Performed on first and last piece of equipment    Resistance Training Performed Yes    VAD Patient? No      Pain Assessment   Currently in Pain? No/denies                Social History   Tobacco Use  Smoking Status Never  Smokeless Tobacco Never    Goals Met:  Independence with exercise equipment Exercise tolerated well No report of concerns or symptoms today Strength training completed today  Goals Unmet:  Not Applicable  Comments: Pt able to follow exercise prescription today without complaint.  Will continue to monitor for progression.    Dr. Emily Filbert is Medical Director for [place].  Dr. Ottie Glazier is Medical Director for [facility] Pulmonary Rehabilitation.

## 2022-09-24 NOTE — Assessment & Plan Note (Signed)
Stable, chronic.  Continue current medication.  Well-controlled on losartan 50 mg daily, Cardizem CD 240 mg daily, clonidine 0.1 mg tablet daily and bisoprolol 10 mg p.o. daily

## 2022-09-24 NOTE — Assessment & Plan Note (Signed)
Chronic, stable control on Qvar

## 2022-09-26 ENCOUNTER — Other Ambulatory Visit: Payer: Self-pay | Admitting: Family Medicine

## 2022-09-27 ENCOUNTER — Encounter: Payer: BC Managed Care – PPO | Admitting: *Deleted

## 2022-09-27 DIAGNOSIS — Z951 Presence of aortocoronary bypass graft: Secondary | ICD-10-CM | POA: Diagnosis not present

## 2022-09-27 NOTE — Progress Notes (Signed)
Daily Session Note  Patient Details  Name: Amanda Estrada MRN: [id number] Date of Birth: April 21, 1961 Referring Provider:   Flowsheet Row Cardiac Rehab from 08/09/2022 in [facility] Cardiac [facility] Pulmonary Rehab  Referring Provider Pleas Patricia MD       Encounter Date: 09/27/2022  Check In:  Session Check In - 09/27/22 1329       Check-In   Supervising physician immediately available to respond to emergencies See telemetry face sheet for immediately available ER MD    Location [facility]-Cardiac & Pulmonary Rehab    Staff Present Alberteen Sam, MA, RCEP, CCRP, CCET;Laureen Owens Shark, BS, RRT, CPFT;Marji Kuehnel Sherryll Burger, RN Odelia Gage, RN, ADN    Virtual Visit No    Medication changes reported     No    Fall or balance concerns reported    No    Warm-up and Cool-down Performed on first and last piece of equipment    Resistance Training Performed Yes    VAD Patient? No    PAD/SET Patient? No      Pain Assessment   Currently in Pain? No/denies                Social History   Tobacco Use  Smoking Status Never  Smokeless Tobacco Never    Goals Met:  Independence with exercise equipment Exercise tolerated well No report of concerns or symptoms today Strength training completed today  Goals Unmet:  Not Applicable  Comments: Pt able to follow exercise prescription today without complaint.  Will continue to monitor for progression.    Dr. Emily Filbert is Medical Director for [place].  Dr. Ottie Glazier is Medical Director for [facility] Pulmonary Rehabilitation.

## 2022-10-06 ENCOUNTER — Encounter: Payer: Self-pay | Admitting: *Deleted

## 2022-10-06 ENCOUNTER — Encounter: Payer: BC Managed Care – PPO | Admitting: *Deleted

## 2022-10-06 DIAGNOSIS — Z951 Presence of aortocoronary bypass graft: Secondary | ICD-10-CM

## 2022-10-06 NOTE — Progress Notes (Signed)
Daily Session Note  Patient Details  Name: Amanda Estrada MRN: [id number] Date of Birth: Feb 13, 1961 Referring Provider:   Flowsheet Row Cardiac Rehab from 08/09/2022 in [facility] Cardiac [facility] Pulmonary Rehab  Referring Provider Pleas Patricia MD       Encounter Date: 10/06/2022  Check In:  Session Check In - 10/06/22 1041       Check-In   Supervising physician immediately available to respond to emergencies See telemetry face sheet for immediately available ER MD    Location [facility]-Cardiac & Pulmonary Rehab    Staff Present Alberteen Sam, MA, RCEP, CCRP, Mindi Curling, RN, ADN;Meredith Sherryll Burger, RN BSN;Noah Tickle, BS, Exercise Physiologist    Virtual Visit No    Medication changes reported     No    Fall or balance concerns reported    No    Warm-up and Cool-down Performed on first and last piece of equipment    Resistance Training Performed Yes    VAD Patient? No    PAD/SET Patient? No      Pain Assessment   Currently in Pain? No/denies                Social History   Tobacco Use  Smoking Status Never  Smokeless Tobacco Never    Goals Met:  Independence with exercise equipment Exercise tolerated well No report of concerns or symptoms today Strength training completed today  Goals Unmet:  Not Applicable  Comments: Pt able to follow exercise prescription today without complaint.  Will continue to monitor for progression.    Dr. Emily Filbert is Medical Director for [place].  Dr. Ottie Glazier is Medical Director for [facility] Pulmonary Rehabilitation.

## 2022-10-06 NOTE — Progress Notes (Signed)
Cardiac Individual Treatment Plan  Patient Details  Name: MENA SIMONIS MRN: [id number] Date of Birth: 1961/09/30 Referring Provider:   Flowsheet Row Cardiac Rehab from 08/09/2022 in [facility] Cardiac and Pulmonary Rehab  Referring Provider Pleas Patricia MD       Initial Encounter Date:  Flowsheet Row Cardiac Rehab from 08/09/2022 in [facility] Cardiac and Pulmonary Rehab  Date 08/09/22       Visit Diagnosis: S/P CABG x 3  Patient's Home Medications on Admission:  Current Outpatient Medications:    albuterol (VENTOLIN HFA) 108 (90 Base) MCG/ACT inhaler, Inhale 2 puffs into the lungs every 4 (four) hours as needed for wheezing or shortness of breath., Disp: 8 g, Rfl: 2   amiodarone (PACERONE) 200 MG tablet, Take by mouth., Disp: , Rfl:    apixaban (ELIQUIS) 5 MG TABS tablet, Take 1 tablet (5 mg total) by mouth 2 (two) times daily., Disp: 60 tablet, Rfl: 11   aspirin EC 81 MG tablet, Take by mouth., Disp: , Rfl:    beclomethasone (QVAR) 80 MCG/ACT inhaler, Inhale 1 puff into the lungs in the morning and at bedtime., Disp: 1 each, Rfl: 12   bisoprolol (ZEBETA) 10 MG tablet, Take 1 tablet (10 mg total) by mouth daily., Disp: 90 tablet, Rfl: 3   cloNIDine (CATAPRES) 0.1 MG tablet, TAKE 1 TABLET BY MOUTH EVERYDAY AT BEDTIME, Disp: 90 tablet, Rfl: 3   diltiazem (CARDIZEM CD) 240 MG 24 hr capsule, TAKE 1 CAPSULE BY MOUTH EVERY DAY, Disp: 90 capsule, Rfl: 3   fluticasone (FLONASE) 50 MCG/ACT nasal spray, PLACE 1 SPRAY INTO BOTH NOSTRILS DAILY AS NEEDED FOR ALLERGIES., Disp: 48 mL, Rfl: 3   levocetirizine (XYZAL) 5 MG tablet, Take 5 mg by mouth at bedtime. , Disp: , Rfl:    losartan (COZAAR) 50 MG tablet, Take 50 mg by mouth daily., Disp: , Rfl:    montelukast (SINGULAIR) 10 MG tablet, TAKE 1 TABLET BY MOUTH EVERYDAY AT BEDTIME, Disp: 90 tablet, Rfl: 3   omeprazole (PRILOSEC) 40 MG capsule, Take 1 capsule (40 mg total) by mouth 2 (two) times daily., Disp: 180 capsule, Rfl: 3   rosuvastatin  (CRESTOR) 40 MG tablet, Take 40 mg by mouth daily., Disp: , Rfl:   Past Medical History: Past Medical History:  Diagnosis Date   Allergy    Anxiety    patient denies   Asthma    Cataract    Dysplastic nevus 08/28/2018   R paraspinal mid to upper back - severe, excision 11/21/2018   Dysplastic nevus 08/28/2018   L lat breast sup - moderate   Dysplastic nevus 07/02/2019   R mid to low back 4.0 cm lat to spine - moderate   Dysplastic nevus 07/02/2019   R inframammary - moderate   GERD (gastroesophageal reflux disease)    Glaucoma    History of echocardiogram    a. 11/2015: EF 55-60%, no RWMA, nl LV diastolic fxn, PASP nl   Hypertension    Obesity    PAF (paroxysmal atrial fibrillation) ([place])    a. CHADS2VASc = 2 (HTN, sex category)   Sinusitis 2011   Recurrent per Dr. Tami Ribas with [facility]    Tobacco Use: Social History   Tobacco Use  Smoking Status Never  Smokeless Tobacco Never    Labs: Review Flowsheet  More data exists      Latest Ref Rng & Units 10/14/2017 12/01/2018 02/18/2020 05/27/2021 09/17/2022  Labs for ITP Cardiac and Pulmonary Rehab  Cholestrol 0 - 200 mg/dL  155  158  176  160  161   LDL (calc) 0 - 99 mg/dL 48  43  62  60  45   HDL-C >39.00 mg/dL 95.40  104.90  94.50  76.60  97.60   Trlycerides 0.0 - 149.0 mg/dL 62.0  51.0  96.0  117.0  91.0      Exercise Target Goals: Exercise Program Goal: Individual exercise prescription set using results from initial 6 min walk test and THRR while considering  patient's activity barriers and safety.   Exercise Prescription Goal: Initial exercise prescription builds to 30-45 minutes a day of aerobic activity, 2-3 days per week.  Home exercise guidelines will be given to patient during program as part of exercise prescription that the participant will acknowledge.   Education: Aerobic Exercise: - Group verbal and visual presentation on the components of exercise prescription. Introduces F.I.T.T principle from  ACSM for exercise prescriptions.  Reviews F.I.T.T. principles of aerobic exercise including progression. Written material given at graduation.   Education: Resistance Exercise: - Group verbal and visual presentation on the components of exercise prescription. Introduces F.I.T.T principle from ACSM for exercise prescriptions  Reviews F.I.T.T. principles of resistance exercise including progression. Written material given at graduation.    Education: Exercise & Equipment Safety: - Individual verbal instruction and demonstration of equipment use and safety with use of the equipment. Flowsheet Row Cardiac Rehab from 09/01/2022 in [facility] Cardiac and Pulmonary Rehab  Date 08/02/22  Educator [facility]  Instruction Review Code 1- Verbalizes Understanding       Education: Exercise Physiology & General Exercise Guidelines: - Group verbal and written instruction with models to review the exercise physiology of the cardiovascular system and associated critical values. Provides general exercise guidelines with specific guidelines to those with heart or lung disease.    Education: Flexibility, Balance, Mind/Body Relaxation: - Group verbal and visual presentation with interactive activity on the components of exercise prescription. Introduces F.I.T.T principle from ACSM for exercise prescriptions. Reviews F.I.T.T. principles of flexibility and balance exercise training including progression. Also discusses the mind body connection.  Reviews various relaxation techniques to help reduce and manage stress (i.e. Deep breathing, progressive muscle relaxation, and visualization). Balance handout provided to take home. Written material given at graduation.   Activity Barriers & Risk Stratification:  Activity Barriers & Cardiac Risk Stratification - 08/09/22 1155       Activity Barriers & Cardiac Risk Stratification   Activity Barriers Deconditioning;Muscular Weakness;Balance Concerns    Cardiac Risk Stratification  High             6 Minute Walk:  6 Minute Walk     Row Name 08/09/22 1154         6 Minute Walk   Phase Initial     Distance 1200 feet     Walk Time 6 minutes     # of Rest Breaks 0     MPH 2.27     METS 3.21     RPE 11     Perceived Dyspnea  1     VO2 Peak 11.24     Symptoms Yes (comment)     Comments SOB     Resting HR 74 bpm     Resting BP 136/74     Resting Oxygen Saturation  100 %     Exercise Oxygen Saturation  during 6 min walk 100 %     Max Ex. HR 96 bpm     Max Ex. BP 174/82  2 Minute Post BP 142/70              Oxygen Initial Assessment:   Oxygen Re-Evaluation:   Oxygen Discharge (Final Oxygen Re-Evaluation):   Initial Exercise Prescription:  Initial Exercise Prescription - 08/09/22 1100       Date of Initial Exercise RX and Referring Provider   Date 08/09/22    Referring Provider Pleas Patricia MD      Oxygen   Maintain Oxygen Saturation 88% or higher      Treadmill   MPH 2.2    Grade 1    Minutes 15    METs 2.99      Recumbant Elliptical   Level 1    RPM 50    Minutes 15    METs 3      REL-XR   Level 3    Speed 50    Minutes 15    METs 3      Prescription Details   Frequency (times per week) 3    Duration Progress to 30 minutes of continuous aerobic without signs/symptoms of physical distress      Intensity   THRR 40-80% of Max Heartrate 109-142    Ratings of Perceived Exertion 11-13    Perceived Dyspnea 0-4      Progression   Progression Continue to progress workloads to maintain intensity without signs/symptoms of physical distress.      Resistance Training   Training Prescription Yes    Weight 4 lb    Reps 10-15             Perform Capillary Blood Glucose checks as needed.  Exercise Prescription Changes:   Exercise Prescription Changes     Row Name 08/09/22 1100 08/17/22 1500 08/25/22 1000 08/30/22 1000 09/15/22 1600     Response to Exercise   Blood Pressure (Admit) 136/74 128/62 --  118/64 122/60   Blood Pressure (Exercise) 174/82 142/64 -- -- 164/70   Blood Pressure (Exit) 142/70 122/62 -- 126/60 122/78   Heart Rate (Admit) 75 bpm 77 bpm -- 90 bpm 88 bpm   Heart Rate (Exercise) 96 bpm 104 bpm -- 122 bpm 124 bpm   Heart Rate (Exit) 78 bpm 75 bpm -- 105 bpm 90 bpm   Oxygen Saturation (Admit) 100 % -- -- -- --   Oxygen Saturation (Exercise) 100 % -- -- -- --   Rating of Perceived Exertion (Exercise) 11 12 -- 13 13   Perceived Dyspnea (Exercise) 1 -- -- -- --   Symptoms SOB none -- none none   Comments walk test results -- -- -- --   Duration -- Continue with 30 min of aerobic exercise without signs/symptoms of physical distress. -- Continue with 30 min of aerobic exercise without signs/symptoms of physical distress. Continue with 30 min of aerobic exercise without signs/symptoms of physical distress.   Intensity -- THRR unchanged -- THRR unchanged THRR unchanged     Progression   Progression -- Continue to progress workloads to maintain intensity without signs/symptoms of physical distress. -- Continue to progress workloads to maintain intensity without signs/symptoms of physical distress. Continue to progress workloads to maintain intensity without signs/symptoms of physical distress.   Average METs -- 2.65 -- 2.88 2.86     Resistance Training   Training Prescription -- Yes -- Yes Yes   Weight -- 4 lb -- 4 lb 4 lb   Reps -- 10-15 -- 10-15 10-15     Interval Training   Interval Training --  No -- No No     Treadmill   MPH -- 2.2 -- 2.8 2.8   Grade -- 1 -- 1 1   Minutes -- 15 -- 15 15   METs -- 2.99 -- 3.53 3.53     Recumbant Elliptical   Level -- 2 -- 1.6 2   Minutes -- 15 -- 15 15   METs -- 2.1 -- 1.7 2     REL-XR   Level -- 4 -- 5 2   Minutes -- 15 -- 15 15   METs -- 2.5 -- 3 --     Home Exercise Plan   Plans to continue exercise at -- -- Home (comment)  walking, staff videos Home (comment)  walking, staff videos Home (comment)  walking, staff videos    Frequency -- -- Add 2 additional days to program exercise sessions. Add 2 additional days to program exercise sessions. Add 2 additional days to program exercise sessions.   Initial Home Exercises Provided -- -- 08/25/22 08/25/22 08/25/22     Oxygen   Maintain Oxygen Saturation -- 88% or higher -- 88% or higher 88% or higher    Row Name 09/28/22 1400             Response to Exercise   Blood Pressure (Admit) 128/62       Blood Pressure (Exercise) 148/58       Blood Pressure (Exit) 128/64       Heart Rate (Admit) 75 bpm       Heart Rate (Exercise) 109 bpm       Heart Rate (Exit) 84 bpm       Rating of Perceived Exertion (Exercise) 13       Symptoms none       Duration Continue with 30 min of aerobic exercise without signs/symptoms of physical distress.       Intensity THRR unchanged         Progression   Progression Continue to progress workloads to maintain intensity without signs/symptoms of physical distress.       Average METs 2.98         Resistance Training   Training Prescription Yes       Weight 5 lb       Reps 10-15         Treadmill   MPH 3       Grade 1.5       Minutes 15       METs 3.92         Recumbant Elliptical   Level 1       Minutes 15       METs 1.6         REL-XR   Level 3       Minutes 15       METs 2.4         T5 Nustep   Level 1       Minutes 15       METs 1.8         Home Exercise Plan   Plans to continue exercise at Home (comment)  walking, staff videos       Frequency Add 2 additional days to program exercise sessions.       Initial Home Exercises Provided 08/25/22         Oxygen   Maintain Oxygen Saturation 88% or higher                Exercise Comments:  Exercise Comments     Row Name 08/11/22 1035           Exercise Comments First full day of exercise!  Patient was oriented to gym and equipment including functions, settings, policies, and procedures.  Patient's individual exercise prescription and treatment  plan were reviewed.  All starting workloads were established based on the results of the 6 minute walk test done at initial orientation visit.  The plan for exercise progression was also introduced and progression will be customized based on patient's performance and goals.                Exercise Goals and Review:   Exercise Goals     Row Name 08/09/22 1200             Exercise Goals   Increase Physical Activity Yes       Intervention Provide advice, education, support and counseling about physical activity/exercise needs.;Develop an individualized exercise prescription for aerobic and resistive training based on initial evaluation findings, risk stratification, comorbidities and participant's personal goals.       Expected Outcomes Short Term: Attend rehab on a regular basis to increase amount of physical activity.;Long Term: Add in home exercise to make exercise part of routine and to increase amount of physical activity.;Long Term: Exercising regularly at least 3-5 days a week.       Increase Strength and Stamina Yes       Intervention Provide advice, education, support and counseling about physical activity/exercise needs.;Develop an individualized exercise prescription for aerobic and resistive training based on initial evaluation findings, risk stratification, comorbidities and participant's personal goals.       Expected Outcomes Short Term: Increase workloads from initial exercise prescription for resistance, speed, and METs.;Short Term: Perform resistance training exercises routinely during rehab and add in resistance training at home;Long Term: Improve cardiorespiratory fitness, muscular endurance and strength as measured by increased METs and functional capacity (6MWT)       Able to understand and use rate of perceived exertion (RPE) scale Yes       Intervention Provide education and explanation on how to use RPE scale       Expected Outcomes Short Term: Able to use RPE daily  in rehab to express subjective intensity level;Long Term:  Able to use RPE to guide intensity level when exercising independently       Able to understand and use Dyspnea scale Yes       Intervention Provide education and explanation on how to use Dyspnea scale       Expected Outcomes Short Term: Able to use Dyspnea scale daily in rehab to express subjective sense of shortness of breath during exertion;Long Term: Able to use Dyspnea scale to guide intensity level when exercising independently       Knowledge and understanding of Target Heart Rate Range (THRR) Yes       Intervention Provide education and explanation of THRR including how the numbers were predicted and where they are located for reference       Expected Outcomes Short Term: Able to state/look up THRR;Short Term: Able to use daily as guideline for intensity in rehab;Long Term: Able to use THRR to govern intensity when exercising independently       Able to check pulse independently Yes       Intervention Provide education and demonstration on how to check pulse in carotid and radial arteries.;Review the importance of being able to check your own pulse for safety  during independent exercise       Expected Outcomes Short Term: Able to explain why pulse checking is important during independent exercise;Long Term: Able to check pulse independently and accurately       Understanding of Exercise Prescription Yes       Intervention Provide education, explanation, and written materials on patient's individual exercise prescription       Expected Outcomes Short Term: Able to explain program exercise prescription;Long Term: Able to explain home exercise prescription to exercise independently                Exercise Goals Re-Evaluation :  Exercise Goals Re-Evaluation     Row Name 08/11/22 1035 08/17/22 1508 08/25/22 1034 08/30/22 1052 09/15/22 1618     Exercise Goal Re-Evaluation   Exercise Goals Review Able to understand and use rate of  perceived exertion (RPE) scale;Able to understand and use Dyspnea scale;Knowledge and understanding of Target Heart Rate Range (THRR);Understanding of Exercise Prescription Understanding of Exercise Prescription;Increase Physical Activity;Increase Strength and Stamina Understanding of Exercise Prescription;Increase Physical Activity;Increase Strength and Stamina;Able to understand and use rate of perceived exertion (RPE) scale;Able to understand and use Dyspnea scale;Able to check pulse independently;Knowledge and understanding of Target Heart Rate Range (THRR) Increase Physical Activity;Increase Strength and Stamina;Understanding of Exercise Prescription Increase Physical Activity;Increase Strength and Stamina;Understanding of Exercise Prescription   Comments Reviewed RPE and dyspnea scales, THR and program prescription with pt today.  Pt voiced understanding and was given a copy of goals to take home. Ellarae is off to a good start in rehab. She recently improved her overall average MET level to 2.65 METs. She also did well on the treadmill at a speed of 2.2 mph and an incline of 1%. She tolerated 4 lb hand weights for resistance training as well. We will continue to monitor her progress in the program. Reviewed home exercise with pt today.  Pt plans to walk at home and at beach for exercise.  Reviewed THR, pulse, RPE, sign and symptoms, pulse oximetery and when to call 911 or MD.  Also discussed weather considerations and indoor options.  Pt voiced understanding. Araya is doing well for the first couple weeks she has been here for rehab. She has increased her treadmill speed to a 2.8 mph with a 1% incline.  She is also already up to level 5 on the XR using 4 lb handweights. We will continue to monitor as she progresses in the program. Aireanna continues to do well in rehab. Her MET level has stayed consistently above 2.8 METs. She also has continued to walk on the treadmill at a speed of 2.8 mph and an incline of  1%. We will continue to monitor her progress in the program.   Expected Outcomes Short: Use RPE daily to regulate intensity. Long: Follow program prescription in THR. Short: Continue to follow current exercise prescription. Long: Continue to increase strength and stamina. Short: Continue to walk on off days Long: Continue to improve stamina Short: Continue to increase worklod on treadmill Long: Continue to increase overall MET level Short: Continue to increase workloads as tolerated. Long: Continue to increase strength and stamina.    [place] Name 09/20/22 1355 09/28/22 1455           Exercise Goal Re-Evaluation   Exercise Goals Review Increase Physical Activity;Increase Strength and Stamina;Understanding of Exercise Prescription Increase Physical Activity;Increase Strength and Stamina;Understanding of Exercise Prescription      Comments Danitra is doing well with her exercise at home.  She is walking outdoor for about 20-30 minutes. Encouraged to reach the minimum 30 minutes at once. She goes to the beach once per month and walks there as well. We reiterated the weather conditions now that it is getting colder out and to stay indoors when it gets colder out. She does check her HR with her watch and pulse ox but says it doesn't get up over 100 bpm. She doesn't normally hit her THR but is aware most likely from her medications. Wynelle continues to do well in rehab.  She has increased to working 3 mph/1% incline on the treadmill! She is working at level 3 on the XR and could benefit from increasing her levels on both the T5 Nustep and REL. She is now using 5 lb handweights for resistance training as well. Will continue to monitor.      Expected Outcomes Short: Continue to hit 30 minutes with walking at home Long: Continue to exercise independently Short: Increase level on REL and T5 Long: Continue to increase overall MET level               Discharge Exercise Prescription (Final Exercise Prescription  Changes):  Exercise Prescription Changes - 09/28/22 1400       Response to Exercise   Blood Pressure (Admit) 128/62    Blood Pressure (Exercise) 148/58    Blood Pressure (Exit) 128/64    Heart Rate (Admit) 75 bpm    Heart Rate (Exercise) 109 bpm    Heart Rate (Exit) 84 bpm    Rating of Perceived Exertion (Exercise) 13    Symptoms none    Duration Continue with 30 min of aerobic exercise without signs/symptoms of physical distress.    Intensity THRR unchanged      Progression   Progression Continue to progress workloads to maintain intensity without signs/symptoms of physical distress.    Average METs 2.98      Resistance Training   Training Prescription Yes    Weight 5 lb    Reps 10-15      Treadmill   MPH 3    Grade 1.5    Minutes 15    METs 3.92      Recumbant Elliptical   Level 1    Minutes 15    METs 1.6      REL-XR   Level 3    Minutes 15    METs 2.4      T5 Nustep   Level 1    Minutes 15    METs 1.8      Home Exercise Plan   Plans to continue exercise at Home (comment)   walking, staff videos   Frequency Add 2 additional days to program exercise sessions.    Initial Home Exercises Provided 08/25/22      Oxygen   Maintain Oxygen Saturation 88% or higher             Nutrition:  Target Goals: Understanding of nutrition guidelines, daily intake of sodium <1581m, cholesterol <2089m calories 30% from fat and 7% or less from saturated fats, daily to have 5 or more servings of fruits and vegetables.  Education: All About Nutrition: -Group instruction provided by verbal, written material, interactive activities, discussions, models, and posters to present general guidelines for heart healthy nutrition including fat, fiber, MyPlate, the role of sodium in heart healthy nutrition, utilization of the nutrition label, and utilization of this knowledge for meal planning. Follow up email sent as well. Written material given at graduation.  Flowsheet Row Cardiac  Rehab from 09/01/2022 in [facility] Cardiac and Pulmonary Rehab  Education need identified 08/09/22       Biometrics:  Pre Biometrics - 08/09/22 1200       Pre Biometrics   Height 5' 5.5" (1.664 m)    Weight 194 lb 12.8 oz (88.4 kg)    Waist Circumference 38.5 inches    Hip Circumference 43.5 inches    Waist to Hip Ratio 0.89 %    BMI (Calculated) 31.91    Single Leg Stand 2.7 seconds              Nutrition Therapy Plan and Nutrition Goals:  Nutrition Therapy & Goals - 08/09/22 1123       Nutrition Therapy   Diet Heart healthy, low Na    Drug/Food Interactions Statins/Certain Fruits    Protein (specify units) 70-75g    Fiber 25 grams    Whole Grain Foods 3 servings    Saturated Fats 14 max. grams    Fruits and Vegetables 8 servings/day      Personal Nutrition Goals   Nutrition Goal ST: LT:    Comments 61 y.o. F admitted to cardiac rehab s/p CABG x3. PMHx includes CAD, hypercholesterolemia, HTN, paroxysmal a.fib. Relevant medications includes fluticasone, omeprazole, crestor. She has cut back on her portion sizes. L: leftovers (ex: chicken salad and some pasta salad) D: example steak tonight with potato and salad. Drinks: gatorade zero, sparkling, water. Shandra reports using olive oil and avocado oils with cooking, however, she will like to use butter, milk, and cheese with cooking - discussed some options to lower amount of high fat dairy with cooking and encouraged to choose recipes with lower amounts of dairy at least 2-3 times per week. She reports that her lab values for sodium have been low and her MD recommended to include sodium in her diet. She checks her BP regularly and it has been normal to high; sugested limiting Na to 2313m at the higher end to balance lower sodium numbers and elevated BP - informed her that we may need to make adjustments to this recommendation depeing on her lab and BP numbers moving foward. She reports liking pasta - encouaged her that she can  still have pasta, but add lean protein and non-starchy vegetables so it is more balances and CHO do not take up most of the plate. Since she has decreased her portion sizes as well as starchy vegetables, discussed how heart healthy changes like increased non-starchy vegetables as a portion of the plate may cause her to be hungrier soon after eating and that healthy snacking may help to control hunger. Discussed heart healthy eating and MyPlate. BNoorafeels that she has the tools she needs to achieve her goals with no major barriers reported.      Intervention Plan   Intervention Prescribe, educate and counsel regarding individualized specific dietary modifications aiming towards targeted core components such as weight, hypertension, lipid management, diabetes, heart failure and other comorbidities.;Nutrition handout(s) given to patient.    Expected Outcomes Short Term Goal: Understand basic principles of dietary content, such as calories, fat, sodium, cholesterol and nutrients.;Short Term Goal: A plan has been developed with personal nutrition goals set during dietitian appointment.;Long Term Goal: Adherence to prescribed nutrition plan.             Nutrition Assessments:  MEDIFICTS Score Key: ?70 Need to make dietary changes  40-70 Heart Healthy Diet ? 40 Therapeutic Level Cholesterol Diet  Flowsheet Row  Cardiac Rehab from 08/09/2022 in [facility] Cardiac and Pulmonary Rehab  Picture Your Plate Total Score on Admission 60      Picture Your Plate Scores: <68 Unhealthy dietary pattern with much room for improvement. 41-50 Dietary pattern unlikely to meet recommendations for good health and room for improvement. 51-60 More healthful dietary pattern, with some room for improvement.  >60 Healthy dietary pattern, although there may be some specific behaviors that could be improved.    Nutrition Goals Re-Evaluation:  Nutrition Goals Re-Evaluation     [place] Name 08/25/22 1026 09/20/22 1358            Goals   Nutrition Goal Short Term Goal: Understand basic principles of dietary content, such as calories, fat, sodium, cholesterol and nutrients.; Short Term Goal: A plan has been developed with personal nutrition goals set during dietitian appointment.; Long Term Goal: Adherence to prescribed nutrition plan. Short Term Goal: Understand basic principles of dietary content, such as calories, fat, sodium, cholesterol and nutrients.; Short Term Goal: A plan has been developed with personal nutrition goals set during dietitian appointment.; Long Term Goal: Adherence to prescribed nutrition plan.      Comment Laquonda is doing well with her diet.  She has gotten better about not snacking after dinner. She is being more diligent about watching her sodium and reading food labels. She is still doing well with her portion control as she and her husband share meals to help. She is still getting more variety and trying to add in more vegetables to meals too. She has started to use leaner proteins as well Dilyn states she has not made any huge changes, however, she is avoiding fried foods and higher saturated foods. Her doctor told her from her recent bloodwork that her sodium is low and to eat more in her diet. Encouraged her to ask how much she should have to avoid an excess amount while managing her heart failure at the same time. She has her physical next week to review her bloodwork      Expected Outcome Short: Continue to add in vegetables Long: Conitnue to focus on heart healhty eating. Short: Talk to doctor about amount of sodium intake Long: Continue to eat heart healthy and follow RD recommendations               Nutrition Goals Discharge (Final Nutrition Goals Re-Evaluation):  Nutrition Goals Re-Evaluation - 09/20/22 1358       Goals   Nutrition Goal Short Term Goal: Understand basic principles of dietary content, such as calories, fat, sodium, cholesterol and nutrients.; Short Term Goal: A  plan has been developed with personal nutrition goals set during dietitian appointment.; Long Term Goal: Adherence to prescribed nutrition plan.    Comment Kindall states she has not made any huge changes, however, she is avoiding fried foods and higher saturated foods. Her doctor told her from her recent bloodwork that her sodium is low and to eat more in her diet. Encouraged her to ask how much she should have to avoid an excess amount while managing her heart failure at the same time. She has her physical next week to review her bloodwork    Expected Outcome Short: Talk to doctor about amount of sodium intake Long: Continue to eat heart healthy and follow RD recommendations             Psychosocial: Target Goals: Acknowledge presence or absence of significant depression and/or stress, maximize coping skills, provide positive support system. Participant is  able to verbalize types and ability to use techniques and skills needed for reducing stress and depression.   Education: Stress, Anxiety, and Depression - Group verbal and visual presentation to define topics covered.  Reviews how body is impacted by stress, anxiety, and depression.  Also discusses healthy ways to reduce stress and to treat/manage anxiety and depression.  Written material given at graduation. Flowsheet Row Cardiac Rehab from 09/01/2022 in [facility] Cardiac and Pulmonary Rehab  Education need identified 08/09/22       Education: Sleep Hygiene -Provides group verbal and written instruction about how sleep can affect your health.  Define sleep hygiene, discuss sleep cycles and impact of sleep habits. Review good sleep hygiene tips.    Initial Review & Psychosocial Screening:  Initial Psych Review & Screening - 08/02/22 1125       Initial Review   Current issues with None Identified      Family Dynamics   Good Support System? Yes    Comments Rochella can look to her spouse family and freinds for support. She feels good  about her health and is ready to make changes.      Barriers   Psychosocial barriers to participate in program The patient should benefit from training in stress management and relaxation.;There are no identifiable barriers or psychosocial needs.      Screening Interventions   Interventions Encouraged to exercise;To provide support and resources with identified psychosocial needs;Provide feedback about the scores to participant    Expected Outcomes Short Term goal: Utilizing psychosocial counselor, staff and physician to assist with identification of specific Stressors or current issues interfering with healing process. Setting desired goal for each stressor or current issue identified.;Long Term Goal: Stressors or current issues are controlled or eliminated.;Short Term goal: Identification and review with participant of any Quality of Life or Depression concerns found by scoring the questionnaire.;Long Term goal: The participant improves quality of Life and PHQ9 Scores as seen by post scores and/or verbalization of changes             Quality of Life Scores:   Quality of Life - 08/09/22 1200       Quality of Life   Select Quality of Life      Quality of Life Scores   Health/Function Pre 26 %    Socioeconomic Pre 23.75 %    Psych/Spiritual Pre 30 %    Family Pre 28.8 %    GLOBAL Pre 26.69 %            Scores of 19 and below usually indicate a poorer quality of life in these areas.  A difference of  2-3 points is a clinically meaningful difference.  A difference of 2-3 points in the total score of the Quality of Life Index has been associated with significant improvement in overall quality of life, self-image, physical symptoms, and general health in studies assessing change in quality of life.  PHQ-9: Review Flowsheet  More data exists      08/09/2022 06/26/2021 02/21/2020 12/05/2018 09/26/2018  Depression screen PHQ 2/9  Decreased Interest 0 0 0 0 0  Down, Depressed, Hopeless  0 0 0 0 0  PHQ - 2 Score 0 0 0 0 0  Altered sleeping 0 - - - -  Tired, decreased energy 0 - - - -  Change in appetite 0 - - - -  Feeling bad or failure about yourself  0 - - - -  Trouble concentrating 0 - - - -  Moving slowly or fidgety/restless 0 - - - -  Suicidal thoughts 0 - - - -  PHQ-9 Score 0 - - - -  Difficult doing work/chores Not difficult at all - - - -   Interpretation of Total Score  Total Score Depression Severity:  1-4 = Minimal depression, 5-9 = Mild depression, 10-14 = Moderate depression, 15-19 = Moderately severe depression, 20-27 = Severe depression   Psychosocial Evaluation and Intervention:  Psychosocial Evaluation - 08/02/22 1127       Psychosocial Evaluation & Interventions   Interventions Encouraged to exercise with the program and follow exercise prescription;Relaxation education;Stress management education    Comments Tersa can look to her spouse family and freinds for support. She feels good about her health and is ready to make changes.    Expected Outcomes Short: Start [facility] to help with mood. Long: Maintain a healthy mental state    Continue Psychosocial Services  Follow up required by staff             Psychosocial Re-Evaluation:  Psychosocial Re-Evaluation     [place] Name 08/25/22 1024 09/20/22 1406           Psychosocial Re-Evaluation   Current issues with Current Stress Concerns Current Stress Concerns      Comments Hanley is doing well in rehab. She has started back to work.  She is working from home so that is helping. She did need to switch her Mondays to an afternoon appt due to a work conference call.  She is sleeping until 330-4am and then wakes, unable to get back to sleep.  Her legs from surgery are painful and she is also waking with coughing and they are going to try to change something else will help. She seeing PA next week for that. Tacha is doing well mentally. 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 She may have a retirement party.  She cuts her phone off to dissociate away from work. She goes to the beach once per month which really helps her get her mind off things. Her birthday was a couple days ago and went to [facility] to celebrate! Her cough is completely gone as her doctor pinpointed which exact medication was causing that side effect. She feels much better. She did not sleep well last night but not normally it is not a problem. She'll continue to watch it and speak up if it becomes a problem.      Expected Outcomes Short; Figure which med is causing her coughing Long: Continue to cope with return to work. Short: Retire in December! Long: Continue to maintain positive attitude      Interventions Stress management education;Encouraged to attend Cardiac Rehabilitation for the exercise Encouraged to attend Cardiac Rehabilitation for the exercise      Continue Psychosocial Services  Follow up required by staff Follow up required by staff               Psychosocial Discharge (Final Psychosocial Re-Evaluation):  Psychosocial Re-Evaluation - 09/20/22 1406       Psychosocial Re-Evaluation   Current issues with Current Stress Concerns    Comments Basha is doing well mentally. 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 She may have a retirement party.  She cuts her phone off to dissociate away from work. She goes to  the beach once per month which really helps her get her mind off things. Her birthday was a couple days ago and went to [facility] to celebrate! Her cough is completely gone as her doctor pinpointed which exact medication was causing that side effect. She feels much better. She did not sleep well last night but not normally it is not a problem. She'll continue to watch it and speak up if it becomes a problem.    Expected Outcomes Short: Retire in December! Long:  Continue to maintain positive attitude    Interventions Encouraged to attend Cardiac Rehabilitation for the exercise    Continue Psychosocial Services  Follow up required by staff             Vocational Rehabilitation: Provide vocational rehab assistance to qualifying candidates.   Vocational Rehab Evaluation & Intervention:   Education: Education Goals: Education classes will be provided on a variety of topics geared toward better understanding of heart health and risk factor modification. Participant will state understanding/return demonstration of topics presented as noted by education test scores.  Learning Barriers/Preferences:  Learning Barriers/Preferences - 08/02/22 1124       Learning Barriers/Preferences   Learning Barriers None    Learning Preferences None             General Cardiac Education Topics:  AED/CPR: - Group verbal and written instruction with the use of models to demonstrate the basic use of the AED with the basic ABC's of resuscitation.   Anatomy and Cardiac Procedures: - Group verbal and visual presentation and models provide information about basic cardiac anatomy and function. Reviews the testing methods done to diagnose heart disease and the outcomes of the test results. Describes the treatment choices: Medical Management, Angioplasty, or Coronary Bypass Surgery for treating various heart conditions including Myocardial Infarction, Angina, Valve Disease, and Cardiac Arrhythmias.  Written material given at graduation.   Medication Safety: - Group verbal and visual instruction to review commonly prescribed medications for heart and lung disease. Reviews the medication, class of the drug, and side effects. Includes the steps to properly store meds and maintain the prescription regimen.  Written material given at graduation.   Intimacy: - Group verbal instruction through game format to discuss how heart and lung disease can affect sexual  intimacy. Written material given at graduation.. Flowsheet Row Cardiac Rehab from 09/01/2022 in [facility] Cardiac and Pulmonary Rehab  Education need identified 08/09/22       Know Your Numbers and Heart Failure: - Group verbal and visual instruction to discuss disease risk factors for cardiac and pulmonary disease and treatment options.  Reviews associated critical values for Overweight/Obesity, Hypertension, Cholesterol, and Diabetes.  Discusses basics of heart failure: signs/symptoms and treatments.  Introduces Heart Failure Zone chart for action plan for heart failure.  Written material given at graduation.   Infection Prevention: - Provides verbal and written material to individual with discussion of infection control including proper hand washing and proper equipment cleaning during exercise session. Flowsheet Row Cardiac Rehab from 09/01/2022 in [facility] Cardiac and Pulmonary Rehab  Date 08/02/22  Educator [facility]  Instruction Review Code 1- Verbalizes Understanding       Falls Prevention: - Provides verbal and written material to individual with discussion of falls prevention and safety. Flowsheet Row Cardiac Rehab from 09/01/2022 in [facility] Cardiac and Pulmonary Rehab  Date 08/02/22  Educator [facility]  Instruction Review Code 1- Verbalizes Understanding       Other: -Provides group and verbal instruction on various  topics (see comments)   Knowledge Questionnaire Score:  Knowledge Questionnaire Score - 08/09/22 1201       Knowledge Questionnaire Score   Pre Score 23/26             Core Components/Risk Factors/Patient Goals at Admission:  Personal Goals and Risk Factors at Admission - 08/09/22 1202       Core Components/Risk Factors/Patient Goals on Admission    Weight Management Yes;Weight Loss;Obesity    Intervention Weight Management: Develop a combined nutrition and exercise program designed to reach desired caloric intake, while maintaining appropriate intake of nutrient  and fiber, sodium and fats, and appropriate energy expenditure required for the weight goal.;Weight Management/Obesity: Establish reasonable short term and long term weight goals.;Weight Management: Provide education and appropriate resources to help participant work on and attain dietary goals.;Obesity: Provide education and appropriate resources to help participant work on and attain dietary goals.    Admit Weight 194 lb 12.8 oz (88.4 kg)    Goal Weight: Short Term 190 lb (86.2 kg)    Goal Weight: Long Term 185 lb (83.9 kg)    Expected Outcomes Short Term: Continue to assess and modify interventions until short term weight is achieved;Long Term: Adherence to nutrition and physical activity/exercise program aimed toward attainment of established weight goal;Weight Loss: Understanding of general recommendations for a balanced deficit meal plan, which promotes 1-2 lb weight loss per week and includes a negative energy balance of [phone] kcal/d;Understanding recommendations for meals to include 15-35% energy as protein, 25-35% energy from fat, 35-60% energy from carbohydrates, less than 268m of dietary cholesterol, 20-35 gm of total fiber daily;Understanding of distribution of calorie intake throughout the day with the consumption of 4-5 meals/snacks    Hypertension Yes    Intervention Provide education on lifestyle modifcations including regular physical activity/exercise, weight management, moderate sodium restriction and increased consumption of fresh fruit, vegetables, and low fat dairy, alcohol moderation, and smoking cessation.;Monitor prescription use compliance.    Expected Outcomes Short Term: Continued assessment and intervention until BP is < 140/944mHG in hypertensive participants. < 130/8059mG in hypertensive participants with diabetes, heart failure or chronic kidney disease.;Long Term: Maintenance of blood pressure at goal levels.    Lipids Yes    Intervention Provide education and support  for participant on nutrition & aerobic/resistive exercise along with prescribed medications to achieve LDL <48m45mDL >40mg5m Expected Outcomes Short Term: Participant states understanding of desired cholesterol values and is compliant with medications prescribed. Participant is following exercise prescription and nutrition guidelines.;Long Term: Cholesterol controlled with medications as prescribed, with individualized exercise RX and with personalized nutrition plan. Value goals: LDL < 48mg,72m > 40 mg.             Education:Diabetes - Individual verbal and written instruction to review signs/symptoms of diabetes, desired ranges of glucose level fasting, after meals and with exercise. Acknowledge that pre and post exercise glucose checks will be done for 3 sessions at entry of program.   Core Components/Risk Factors/Patient Goals Review:   Goals and Risk Factor Review     Row Name 08/25/22 1029 09/20/22 1400           Core Components/Risk Factors/Patient Goals Review   Personal Goals Review Weight Management/Obesity;Heart Failure;Hypertension;Lipids Weight Management/Obesity;Heart Failure;Hypertension;Lipids      Review BonnieWaylynning well in rehab. Her weight will do well as long as she is not eating at the beach. Anytime she goes to beach,[place] her  weight goes up.  She has not had any heart failure symtptoms.  Her pressures are doing well overall, but as they are working on her medications due to a cough she has noticed that it will go up and down.  She sees the PA again next week for next adjustment. Freddye continues to do well. She states she has lost a few pounds but wants to lose more, no weight goal. She does weigh herself daily and is aware to be mindful of any sudden weight gain that could possibly be fluid. She denies heart failure symptoms. She is trying to avoid high saturated dats and fried foods to help with her weight loss. Her cough is gone as her doctor switched  her medications around and found out which one was causing the cough. She has a physical next week to review her bloodwork as her doctor states she is anemic and has low sodium. They will discuss possible supplements/next stemps. BP at home is good- she has a log on her phone. They range 270-350K systolic and 93-81W diastolic. Her doctor swtiched her BP meds 2 weeks ago and is dilligently watching her pressures      Expected Outcomes Short: Continue to work on weight loss Long: Continue to improve stamina. Short: Talk to doctor about blood work (sodium and iron levels) Long: Continue to manage lifestyle risk factors               Core Components/Risk Factors/Patient Goals at Discharge (Final Review):   Goals and Risk Factor Review - 09/20/22 1400       Core Components/Risk Factors/Patient Goals Review   Personal Goals Review Weight Management/Obesity;Heart Failure;Hypertension;Lipids    Review Jullisa continues to do well. She states she has lost a few pounds but wants to lose more, no weight goal. She does weigh herself daily and is aware to be mindful of any sudden weight gain that could possibly be fluid. She denies heart failure symptoms. She is trying to avoid high saturated dats and fried foods to help with her weight loss. Her cough is gone as her doctor switched her medications around and found out which one was causing the cough. She has a physical next week to review her bloodwork as her doctor states she is anemic and has low sodium. They will discuss possible supplements/next stemps. BP at home is good- she has a log on her phone. They range 299-371I systolic and 96-78L diastolic. Her doctor swtiched her BP meds 2 weeks ago and is dilligently watching her pressures    Expected Outcomes Short: Talk to doctor about blood work (sodium and iron levels) Long: Continue to manage lifestyle risk factors             ITP Comments:  ITP Comments     Row Name 08/02/22 1123 08/09/22 1154  08/11/22 0808 08/11/22 1035 09/08/22 1038   ITP Comments Virtual Visit completed. Patient informed on EP and RD appointment and 6 Minute walk test. Patient also informed of patient health questionnaires on My Chart. Patient Verbalizes understanding. Visit diagnosis can be found in [facility] 06/07/2022. Completed 6MWT and gym orientation. Initial ITP created and sent for review to Dr. Emily Filbert, Medical Director. 30 Day review completed. Medical Director ITP review done, changes made as directed, and signed approval by Medical Director.   New to program First full day of exercise!  Patient was oriented to gym and equipment including functions, settings, policies, and procedures.  Patient's individual exercise prescription and treatment  plan were reviewed.  All starting workloads were established based on the results of the 6 minute walk test done at initial orientation visit.  The plan for exercise progression was also introduced and progression will be customized based on patient's performance and goals. 30 Day review completed. Medical Director ITP review done, changes made as directed, and signed approval by Medical Director.    [place] Name 10/06/22 1101           ITP Comments 30 Day review completed. Medical Director ITP review done, changes made as directed, and signed approval by Medical Director.                Comments:

## 2022-10-11 ENCOUNTER — Encounter: Payer: BC Managed Care – PPO | Attending: Cardiovascular Disease | Admitting: *Deleted

## 2022-10-11 DIAGNOSIS — Z48812 Encounter for surgical aftercare following surgery on the circulatory system: Secondary | ICD-10-CM | POA: Diagnosis not present

## 2022-10-11 DIAGNOSIS — Z951 Presence of aortocoronary bypass graft: Secondary | ICD-10-CM | POA: Insufficient documentation

## 2022-10-11 NOTE — Progress Notes (Signed)
Daily Session Note  Patient Details  Name: Amanda Estrada MRN: [id number] Date of Birth: 08/07/1961 Referring Provider:   Flowsheet Row Cardiac Rehab from 08/09/2022 in [facility] Cardiac and Pulmonary Rehab  Referring Provider Pleas Patricia MD       Encounter Date: 10/11/2022  Check In:  Session Check In - 10/11/22 1345       Check-In   Supervising physician immediately available to respond to emergencies See telemetry face sheet for immediately available ER MD    Location [facility]-Cardiac & Pulmonary Rehab    Staff Present Darlyne Russian, RN, ADN;Meredith Sherryll Burger, RN BSN;Jessica Luan Pulling, MA, RCEP, CCRP, CCET;Joseph [facility], [place]    Virtual Visit No    Medication changes reported     No    Fall or balance concerns reported    No    Warm-up and Cool-down Performed on first and last piece of equipment    Resistance Training Performed Yes    VAD Patient? No    PAD/SET Patient? No      Pain Assessment   Currently in Pain? No/denies                Social History   Tobacco Use  Smoking Status Never  Smokeless Tobacco Never    Goals Met:  Independence with exercise equipment Exercise tolerated well No report of concerns or symptoms today Strength training completed today  Goals Unmet:  Not Applicable  Comments: Pt able to follow exercise prescription today without complaint.  Will continue to monitor for progression.    Dr. Emily Filbert is Medical Director for [place].  Dr. Ottie Glazier is Medical Director for [facility] Pulmonary Rehabilitation.

## 2022-10-13 ENCOUNTER — Encounter: Payer: BC Managed Care – PPO | Admitting: *Deleted

## 2022-10-13 DIAGNOSIS — Z48812 Encounter for surgical aftercare following surgery on the circulatory system: Secondary | ICD-10-CM | POA: Diagnosis not present

## 2022-10-13 DIAGNOSIS — Z951 Presence of aortocoronary bypass graft: Secondary | ICD-10-CM | POA: Diagnosis not present

## 2022-10-13 NOTE — Progress Notes (Signed)
Daily Session Note  Patient Details  Name: Amanda Estrada MRN: [id number] Date of Birth: [birth year] Referring Provider:   Flowsheet Row Cardiac Rehab from 08/09/2022 in [facility] Cardiac and Pulmonary Rehab  Referring Provider Pleas Patricia MD       Encounter Date: 10/13/2022  Check In:  Session Check In - 10/13/22 1234       Check-In   Supervising physician immediately available to respond to emergencies See telemetry face sheet for immediately available ER MD    Location [facility]-Cardiac & Pulmonary Rehab    Staff Present Nyoka Cowden, RN, BSN, Lauretta Grill, RCP,RRT,BSRT;Noah Tickle, BS, Exercise Physiologist;Kelly Amedeo Plenty, [place], ACSM CEP, Exercise Physiologist;Laureen Owens Shark, BS, RRT, CPFT    Virtual Visit No    Medication changes reported     No    Fall or balance concerns reported    No    Tobacco Cessation No Change    Warm-up and Cool-down Performed on first and last piece of equipment    Resistance Training Performed Yes    VAD Patient? No    PAD/SET Patient? No      Pain Assessment   Currently in Pain? No/denies                Social History   Tobacco Use  Smoking Status Never  Smokeless Tobacco Never    Goals Met:  Independence with exercise equipment Exercise tolerated well No report of concerns or symptoms today  Goals Unmet:  Not Applicable  Comments: Pt able to follow exercise prescription today without complaint.  Will continue to monitor for progression.    Dr. Emily Filbert is Medical Director for [facility].  Dr. Ottie Glazier is Medical Director for [facility] Pulmonary Rehabilitation.

## 2022-10-15 ENCOUNTER — Encounter: Payer: BC Managed Care – PPO | Admitting: *Deleted

## 2022-10-15 DIAGNOSIS — Z951 Presence of aortocoronary bypass graft: Secondary | ICD-10-CM

## 2022-10-15 DIAGNOSIS — Z48812 Encounter for surgical aftercare following surgery on the circulatory system: Secondary | ICD-10-CM | POA: Diagnosis not present

## 2022-10-15 NOTE — Progress Notes (Signed)
Daily Session Note  Patient Details  Name: Amanda Estrada MRN: [id number] Date of Birth: 11/25/1960 Referring Provider:   Flowsheet Row Cardiac Rehab from 08/09/2022 in [facility] Cardiac and Pulmonary Rehab  Referring Provider Pleas Patricia MD       Encounter Date: 10/15/2022  Check In:  Session Check In - 10/15/22 1034       Check-In   Supervising physician immediately available to respond to emergencies See telemetry face sheet for immediately available ER MD    Location [facility]-Cardiac & Pulmonary Rehab    Staff Present Heath Lark, RN, BSN, CCRP;Jessica [place], MA, RCEP, CCRP, CCET;Joseph [place], [place]    Virtual Visit No    Medication changes reported     No    Fall or balance concerns reported    No    Warm-up and Cool-down Performed on first and last piece of equipment    Resistance Training Performed Yes    VAD Patient? No    PAD/SET Patient? No      Pain Assessment   Currently in Pain? No/denies                Social History   Tobacco Use  Smoking Status Never  Smokeless Tobacco Never    Goals Met:  Independence with exercise equipment Exercise tolerated well No report of concerns or symptoms today  Goals Unmet:  Not Applicable  Comments: Pt able to follow exercise prescription today without complaint.  Will continue to monitor for progression.    Dr. Emily Filbert is Medical Director for [facility].  Dr. Ottie Glazier is Medical Director for [facility] Pulmonary Rehabilitation.

## 2022-10-18 ENCOUNTER — Encounter: Payer: BC Managed Care – PPO | Admitting: *Deleted

## 2022-10-18 DIAGNOSIS — Z48812 Encounter for surgical aftercare following surgery on the circulatory system: Secondary | ICD-10-CM | POA: Diagnosis not present

## 2022-10-18 DIAGNOSIS — Z951 Presence of aortocoronary bypass graft: Secondary | ICD-10-CM

## 2022-10-18 NOTE — Progress Notes (Signed)
Daily Session Note  Patient Details  Name: SHIRELY TOREN MRN: [id number] Date of Birth: [birth year] Referring Provider:   Flowsheet Row Cardiac Rehab from 08/09/2022 in [facility] Cardiac and Pulmonary Rehab  Referring Provider Pleas Patricia MD       Encounter Date: 10/18/2022  Check In:  Session Check In - 10/18/22 1346       Check-In   Supervising physician immediately available to respond to emergencies See telemetry face sheet for immediately available ER MD    Location [facility]-Cardiac & Pulmonary Rehab    Staff Present Renita Papa, RN BSN;Megan Tamala Julian, RN, Terie Purser, RCP,RRT,BSRT    Virtual Visit No    Medication changes reported     No    Fall or balance concerns reported    No    Warm-up and Cool-down Performed on first and last piece of equipment    Resistance Training Performed Yes    VAD Patient? No    PAD/SET Patient? No      Pain Assessment   Currently in Pain? No/denies                Social History   Tobacco Use  Smoking Status Never  Smokeless Tobacco Never    Goals Met:  Independence with exercise equipment Exercise tolerated well No report of concerns or symptoms today Strength training completed today  Goals Unmet:  Not Applicable  Comments: Pt able to follow exercise prescription today without complaint.  Will continue to monitor for progression.    Dr. Emily Filbert is Medical Director for [facility].  Dr. Ottie Glazier is Medical Director for [facility] Pulmonary Rehabilitation.

## 2022-10-21 ENCOUNTER — Encounter: Payer: Self-pay | Admitting: Family Medicine

## 2022-10-25 ENCOUNTER — Encounter: Payer: BC Managed Care – PPO | Admitting: *Deleted

## 2022-10-25 DIAGNOSIS — Z48812 Encounter for surgical aftercare following surgery on the circulatory system: Secondary | ICD-10-CM | POA: Diagnosis not present

## 2022-10-25 DIAGNOSIS — Z951 Presence of aortocoronary bypass graft: Secondary | ICD-10-CM | POA: Diagnosis not present

## 2022-10-25 NOTE — Progress Notes (Signed)
Daily Session Note  Patient Details  Name: Amanda Estrada MRN: [id number] Date of Birth: 1961-02-10 Referring Provider:   Flowsheet Row Cardiac Rehab from 08/09/2022 in [facility] Cardiac and Pulmonary Rehab  Referring Provider Pleas Patricia MD       Encounter Date: 10/25/2022  Check In:  Session Check In - 10/25/22 1335       Check-In   Supervising physician immediately available to respond to emergencies See telemetry face sheet for immediately available ER MD    Location [facility]-Cardiac & Pulmonary Rehab    Staff Present Renita Papa, RN BSN;Joseph Tessie Fass, Ernestina Patches, RN, [place]    Virtual Visit No    Medication changes reported     No    Fall or balance concerns reported    No    Warm-up and Cool-down Performed on first and last piece of equipment    Resistance Training Performed Yes    VAD Patient? No    PAD/SET Patient? No      Pain Assessment   Currently in Pain? No/denies                Social History   Tobacco Use  Smoking Status Never  Smokeless Tobacco Never    Goals Met:  Independence with exercise equipment Exercise tolerated well No report of concerns or symptoms today Strength training completed today  Goals Unmet:  Not Applicable  Comments: Pt able to follow exercise prescription today without complaint.  Will continue to monitor for progression.    Dr. Emily Filbert is Medical Director for [facility].  Dr. Ottie Glazier is Medical Director for [facility] Pulmonary Rehabilitation.

## 2022-10-27 ENCOUNTER — Encounter: Payer: BC Managed Care – PPO | Admitting: *Deleted

## 2022-10-27 ENCOUNTER — Other Ambulatory Visit: Payer: Self-pay | Admitting: Cardiology

## 2022-10-27 DIAGNOSIS — Z951 Presence of aortocoronary bypass graft: Secondary | ICD-10-CM | POA: Diagnosis not present

## 2022-10-27 DIAGNOSIS — Z48812 Encounter for surgical aftercare following surgery on the circulatory system: Secondary | ICD-10-CM | POA: Diagnosis not present

## 2022-10-27 NOTE — Progress Notes (Signed)
Daily Session Note  Patient Details  Name: Amanda Estrada MRN: [id number] Date of Birth: 04/03/1961 Referring Provider:   Flowsheet Row Cardiac Rehab from 08/09/2022 in [facility] Cardiac and Pulmonary Rehab  Referring Provider Alexander, John MD       Encounter Date: 10/27/2022  Check In:  Session Check In - 10/27/22 0953       Check-In   Supervising physician immediately available to respond to emergencies See telemetry face sheet for immediately available ER MD    Location [facility]-Cardiac & Pulmonary Rehab    Staff Present Meredith Craven, RN BSN;Joseph [place], RCP,RRT,BSRT;Noah Tickle, BS, Exercise Physiologist    Virtual Visit No    Medication changes reported     No    Fall or balance concerns reported    No    Warm-up and Cool-down Performed on first and last piece of equipment    Resistance Training Performed Yes    VAD Patient? No    PAD/SET Patient? No      Pain Assessment   Currently in Pain? No/denies                Social History   Tobacco Use  Smoking Status Never  Smokeless Tobacco Never    Goals Met:  Independence with exercise equipment Exercise tolerated well No report of concerns or symptoms today Strength training completed today  Goals Unmet:  Not Applicable  Comments: Pt able to follow exercise prescription today without complaint.  Will continue to monitor for progression.    Dr. Mark Miller is Medical Director for [facility] Cardiac [facility].  Dr. Fuad Aleskerov is Medical Director for [facility] Pulmonary Rehabilitation. 

## 2022-10-28 ENCOUNTER — Ambulatory Visit (INDEPENDENT_AMBULATORY_CARE_PROVIDER_SITE_OTHER): Payer: BC Managed Care – PPO | Admitting: Podiatry

## 2022-10-28 DIAGNOSIS — M7661 Achilles tendinitis, right leg: Secondary | ICD-10-CM | POA: Diagnosis not present

## 2022-10-28 DIAGNOSIS — Q666 Other congenital valgus deformities of feet: Secondary | ICD-10-CM

## 2022-10-29 ENCOUNTER — Encounter: Payer: BC Managed Care – PPO | Admitting: *Deleted

## 2022-10-29 DIAGNOSIS — Z48812 Encounter for surgical aftercare following surgery on the circulatory system: Secondary | ICD-10-CM | POA: Diagnosis not present

## 2022-10-29 DIAGNOSIS — Z951 Presence of aortocoronary bypass graft: Secondary | ICD-10-CM | POA: Diagnosis not present

## 2022-10-29 NOTE — Progress Notes (Signed)
Daily Session Note  Patient Details  Name: Amanda Estrada MRN: [id number] Date of Birth: [birth year] Referring Provider:   Flowsheet Row Cardiac Rehab from 08/09/2022 in [facility] Cardiac and Pulmonary Rehab  Referring Provider Pleas Patricia MD       Encounter Date: 10/29/2022  Check In:  Session Check In - 10/29/22 1032       Check-In   Supervising physician immediately available to respond to emergencies See telemetry face sheet for immediately available ER MD    Location [facility]-Cardiac & Pulmonary Rehab    Staff Present Heath Lark, RN, BSN, CCRP;Jessica [place], MA, RCEP, CCRP, CCET;Joseph [place], [place]    Virtual Visit No    Medication changes reported     No    Fall or balance concerns reported    No    Warm-up and Cool-down Performed on first and last piece of equipment    Resistance Training Performed Yes    VAD Patient? No    PAD/SET Patient? No      Pain Assessment   Currently in Pain? No/denies                Social History   Tobacco Use  Smoking Status Never  Smokeless Tobacco Never    Goals Met:  Independence with exercise equipment Exercise tolerated well No report of concerns or symptoms today  Goals Unmet:  Not Applicable  Comments: Pt able to follow exercise prescription today without complaint.  Will continue to monitor for progression.    Dr. Emily Filbert is Medical Director for [facility].  Dr. Ottie Glazier is Medical Director for [facility] Pulmonary Rehabilitation.

## 2022-11-03 ENCOUNTER — Encounter: Payer: Self-pay | Admitting: *Deleted

## 2022-11-03 ENCOUNTER — Encounter: Payer: BC Managed Care – PPO | Admitting: *Deleted

## 2022-11-03 DIAGNOSIS — Z951 Presence of aortocoronary bypass graft: Secondary | ICD-10-CM

## 2022-11-03 DIAGNOSIS — Z48812 Encounter for surgical aftercare following surgery on the circulatory system: Secondary | ICD-10-CM | POA: Diagnosis not present

## 2022-11-03 NOTE — Progress Notes (Signed)
Cardiac Individual Treatment Plan  Patient Details  Name: Amanda Estrada MRN: [id number] Date of Birth: Jun 18, 1961 Referring Provider:   Flowsheet Row Cardiac Rehab from 08/09/2022 in [facility]  Referring Provider Pleas Patricia MD       Initial Encounter Date:  Flowsheet Row Cardiac Rehab from 08/09/2022 in [facility] Cardiac and Pulmonary Rehab  Date 08/09/22       Visit Diagnosis: S/P CABG x 3  Patient's Home Medications on Admission:  Current Outpatient Medications:    albuterol (VENTOLIN HFA) 108 (90 Base) MCG/ACT inhaler, Inhale 2 puffs into the lungs every 4 (four) hours as needed for wheezing or shortness of breath., Disp: 8 g, Rfl: 2   amiodarone (PACERONE) 200 MG tablet, Take by mouth., Disp: , Rfl:    apixaban (ELIQUIS) 5 MG TABS tablet, Take 1 tablet (5 mg total) by mouth 2 (two) times daily., Disp: 60 tablet, Rfl: 11   aspirin EC 81 MG tablet, Take by mouth., Disp: , Rfl:    beclomethasone (QVAR) 80 MCG/ACT inhaler, Inhale 1 puff into the lungs in the morning and at bedtime., Disp: 1 each, Rfl: 12   bisoprolol (ZEBETA) 10 MG tablet, Take 1 tablet (10 mg total) by mouth daily., Disp: 90 tablet, Rfl: 3   cloNIDine (CATAPRES) 0.1 MG tablet, TAKE 1 TABLET BY MOUTH EVERYDAY AT BEDTIME, Disp: 30 tablet, Rfl: 1   diltiazem (CARDIZEM CD) 240 MG 24 hr capsule, TAKE 1 CAPSULE BY MOUTH EVERY DAY, Disp: 90 capsule, Rfl: 3   fluticasone (FLONASE) 50 MCG/ACT nasal spray, PLACE 1 SPRAY INTO BOTH NOSTRILS DAILY AS NEEDED FOR ALLERGIES., Disp: 48 mL, Rfl: 3   levocetirizine (XYZAL) 5 MG tablet, Take 5 mg by mouth at bedtime. , Disp: , Rfl:    losartan (COZAAR) 50 MG tablet, Take 50 mg by mouth daily., Disp: , Rfl:    montelukast (SINGULAIR) 10 MG tablet, TAKE 1 TABLET BY MOUTH EVERYDAY AT BEDTIME, Disp: 90 tablet, Rfl: 3   omeprazole (PRILOSEC) 40 MG capsule, Take 1 capsule (40 mg total) by mouth 2 (two) times daily., Disp: 180 capsule, Rfl: 3   rosuvastatin  (CRESTOR) 40 MG tablet, Take 40 mg by mouth daily., Disp: , Rfl:   Past Medical History: Past Medical History:  Diagnosis Date   Allergy    Anxiety    patient denies   Asthma    Cataract    Dysplastic nevus 08/28/2018   R paraspinal mid to upper back - severe, excision 11/21/2018   Dysplastic nevus 08/28/2018   L lat breast sup - moderate   Dysplastic nevus 07/02/2019   R mid to low back 4.0 cm lat to spine - moderate   Dysplastic nevus 07/02/2019   R inframammary - moderate   GERD (gastroesophageal reflux disease)    Glaucoma    History of echocardiogram    a. 11/2015: EF 55-60%, no RWMA, nl LV diastolic fxn, PASP nl   Hypertension    Obesity    PAF (paroxysmal atrial fibrillation) ([place])    a. CHADS2VASc = 2 (HTN, sex category)   Sinusitis 2011   Recurrent per Dr. Tami Ribas with [facility]    Tobacco Use: Social History   Tobacco Use  Smoking Status Never  Smokeless Tobacco Never    Labs: Review Flowsheet  More data exists      Latest Ref Rng & Units 10/14/2017 12/01/2018 02/18/2020 05/27/2021 09/17/2022  Labs for ITP Cardiac and Pulmonary Rehab  Cholestrol 0 - 200 mg/dL  155  158  176  160  161   LDL (calc) 0 - 99 mg/dL 48  43  62  60  45   HDL-C >39.00 mg/dL 95.40  104.90  94.50  76.60  97.60   Trlycerides 0.0 - 149.0 mg/dL 62.0  51.0  96.0  117.0  91.0      Exercise Target Goals: Exercise Program Goal: Individual exercise prescription set using results from initial 6 min walk test and THRR while considering  patient's activity barriers and safety.   Exercise Prescription Goal: Initial exercise prescription builds to 30-45 minutes a day of aerobic activity, 2-3 days per week.  Home exercise guidelines will be given to patient during program as part of exercise prescription that the participant will acknowledge.   Education: Aerobic Exercise: - Group verbal and visual presentation on the components of exercise prescription. Introduces F.I.T.T principle from  ACSM for exercise prescriptions.  Reviews F.I.T.T. principles of aerobic exercise including progression. Written material given at graduation.   Education: Resistance Exercise: - Group verbal and visual presentation on the components of exercise prescription. Introduces F.I.T.T principle from ACSM for exercise prescriptions  Reviews F.I.T.T. principles of resistance exercise including progression. Written material given at graduation.    Education: Exercise & Equipment Safety: - Individual verbal instruction and demonstration of equipment use and safety with use of the equipment. Flowsheet Row Cardiac Rehab from 09/01/2022 in [facility] Cardiac and Pulmonary Rehab  Date 08/02/22  Educator [facility]  Instruction Review Code 1- Verbalizes Understanding       Education: Exercise Physiology & General Exercise Guidelines: - Group verbal and written instruction with models to review the exercise physiology of the cardiovascular system and associated critical values. Provides general exercise guidelines with specific guidelines to those with heart or lung disease.    Education: Flexibility, Balance, Mind/Body Relaxation: - Group verbal and visual presentation with interactive activity on the components of exercise prescription. Introduces F.I.T.T principle from ACSM for exercise prescriptions. Reviews F.I.T.T. principles of flexibility and balance exercise training including progression. Also discusses the mind body connection.  Reviews various relaxation techniques to help reduce and manage stress (i.e. Deep breathing, progressive muscle relaxation, and visualization). Balance handout provided to take home. Written material given at graduation.   Activity Barriers & Risk Stratification:  Activity Barriers & Cardiac Risk Stratification - 08/09/22 1155       Activity Barriers & Cardiac Risk Stratification   Activity Barriers Deconditioning;Muscular Weakness;Balance Concerns    Cardiac Risk Stratification  High             6 Minute Walk:  6 Minute Walk     Row Name 08/09/22 1154         6 Minute Walk   Phase Initial     Distance 1200 feet     Walk Time 6 minutes     # of Rest Breaks 0     MPH 2.27     METS 3.21     RPE 11     Perceived Dyspnea  1     VO2 Peak 11.24     Symptoms Yes (comment)     Comments SOB     Resting HR 74 bpm     Resting BP 136/74     Resting Oxygen Saturation  100 %     Exercise Oxygen Saturation  during 6 min walk 100 %     Max Ex. HR 96 bpm     Max Ex. BP 174/82  2 Minute Post BP 142/70              Oxygen Initial Assessment:   Oxygen Re-Evaluation:   Oxygen Discharge (Final Oxygen Re-Evaluation):   Initial Exercise Prescription:  Initial Exercise Prescription - 08/09/22 1100       Date of Initial Exercise RX and Referring Provider   Date 08/09/22    Referring Provider Pleas Patricia MD      Oxygen   Maintain Oxygen Saturation 88% or higher      Treadmill   MPH 2.2    Grade 1    Minutes 15    METs 2.99      Recumbant Elliptical   Level 1    RPM 50    Minutes 15    METs 3      REL-XR   Level 3    Speed 50    Minutes 15    METs 3      Prescription Details   Frequency (times per week) 3    Duration Progress to 30 minutes of continuous aerobic without signs/symptoms of physical distress      Intensity   THRR 40-80% of Max Heartrate 109-142    Ratings of Perceived Exertion 11-13    Perceived Dyspnea 0-4      Progression   Progression Continue to progress workloads to maintain intensity without signs/symptoms of physical distress.      Resistance Training   Training Prescription Yes    Weight 4 lb    Reps 10-15             Perform Capillary Blood Glucose checks as needed.  Exercise Prescription Changes:   Exercise Prescription Changes     Row Name 08/09/22 1100 08/17/22 1500 08/25/22 1000 08/30/22 1000 09/15/22 1600     Response to Exercise   Blood Pressure (Admit) 136/74 128/62 --  118/64 122/60   Blood Pressure (Exercise) 174/82 142/64 -- -- 164/70   Blood Pressure (Exit) 142/70 122/62 -- 126/60 122/78   Heart Rate (Admit) 75 bpm 77 bpm -- 90 bpm 88 bpm   Heart Rate (Exercise) 96 bpm 104 bpm -- 122 bpm 124 bpm   Heart Rate (Exit) 78 bpm 75 bpm -- 105 bpm 90 bpm   Oxygen Saturation (Admit) 100 % -- -- -- --   Oxygen Saturation (Exercise) 100 % -- -- -- --   Rating of Perceived Exertion (Exercise) 11 12 -- 13 13   Perceived Dyspnea (Exercise) 1 -- -- -- --   Symptoms SOB none -- none none   Comments walk test results -- -- -- --   Duration -- Continue with 30 min of aerobic exercise without signs/symptoms of physical distress. -- Continue with 30 min of aerobic exercise without signs/symptoms of physical distress. Continue with 30 min of aerobic exercise without signs/symptoms of physical distress.   Intensity -- THRR unchanged -- THRR unchanged THRR unchanged     Progression   Progression -- Continue to progress workloads to maintain intensity without signs/symptoms of physical distress. -- Continue to progress workloads to maintain intensity without signs/symptoms of physical distress. Continue to progress workloads to maintain intensity without signs/symptoms of physical distress.   Average METs -- 2.65 -- 2.88 2.86     Resistance Training   Training Prescription -- Yes -- Yes Yes   Weight -- 4 lb -- 4 lb 4 lb   Reps -- 10-15 -- 10-15 10-15     Interval Training   Interval Training --  No -- No No     Treadmill   MPH -- 2.2 -- 2.8 2.8   Grade -- 1 -- 1 1   Minutes -- 15 -- 15 15   METs -- 2.99 -- 3.53 3.53     Recumbant Elliptical   Level -- 2 -- 1.6 2   Minutes -- 15 -- 15 15   METs -- 2.1 -- 1.7 2     REL-XR   Level -- 4 -- 5 2   Minutes -- 15 -- 15 15   METs -- 2.5 -- 3 --     Home Exercise Plan   Plans to continue exercise at -- -- Home (comment)  walking, staff videos Home (comment)  walking, staff videos Home (comment)  walking, staff videos    Frequency -- -- Add 2 additional days to program exercise sessions. Add 2 additional days to program exercise sessions. Add 2 additional days to program exercise sessions.   Initial Home Exercises Provided -- -- 08/25/22 08/25/22 08/25/22     Oxygen   Maintain Oxygen Saturation -- 88% or higher -- 88% or higher 88% or higher    Row Name 09/28/22 1400 10/14/22 1500 10/26/22 1400         Response to Exercise   Blood Pressure (Admit) 128/62 118/60 130/66     Blood Pressure (Exercise) 148/58 -- --     Blood Pressure (Exit) 128/64 132/68 128/60     Heart Rate (Admit) 75 bpm 62 bpm 65 bpm     Heart Rate (Exercise) 109 bpm 98 bpm 89 bpm     Heart Rate (Exit) 84 bpm 69 bpm 66 bpm     Rating of Perceived Exertion (Exercise) _0 Symptoms none none none     Duration Continue with 30 min of aerobic exercise without signs/symptoms of physical distress. Continue with 30 min of aerobic exercise without signs/symptoms of physical distress. Continue with 30 min of aerobic exercise without signs/symptoms of physical distress.     Intensity THRR unchanged THRR unchanged THRR unchanged       Progression   Progression Continue to progress workloads to maintain intensity without signs/symptoms of physical distress. Continue to progress workloads to maintain intensity without signs/symptoms of physical distress. Continue to progress workloads to maintain intensity without signs/symptoms of physical distress.     Average METs 2.98 2.7 0.36       Resistance Training   Training Prescription Yes Yes Yes     Weight 5 lb 5 lb 5 lb     Reps 10-15 10-15 10-15       Interval Training   Interval Training -- No No       Treadmill   MPH 3 2.7 2.7     Grade 1._1 Minutes _2 METs 3.92 3.44 3.81       NuStep   Level -- -- 4     Minutes -- -- 15     METs -- -- 4       Recumbant Elliptical   Level 1 1.5 1     Minutes _3 METs 1.6 1.8 --       REL-XR   Level _4 Minutes _5 METs 2.4 2.1 --       T5 Nustep   Level 1 -- --  Minutes 15 -- --     METs 1.8 -- --       Home Exercise Plan   Plans to continue exercise at Home (comment)  walking, staff videos Home (comment)  walking, staff videos Home (comment)  walking, staff videos     Frequency Add 2 additional days to program exercise sessions. Add 2 additional days to program exercise sessions. Add 2 additional days to program exercise sessions.     Initial Home Exercises Provided 08/25/22 08/25/22 08/25/22       Oxygen   Maintain Oxygen Saturation 88% or higher 88% or higher 88% or higher              Exercise Comments:   Exercise Comments     Row Name 08/11/22 1035           Exercise Comments First full day of exercise!  Patient was oriented to gym and equipment including functions, settings, policies, and procedures.  Patient's individual exercise prescription and treatment plan were reviewed.  All starting workloads were established based on the results of the 6 minute walk test done at initial orientation visit.  The plan for exercise progression was also introduced and progression will be customized based on patient's performance and goals.                Exercise Goals and Review:   Exercise Goals     Row Name 08/09/22 1200             Exercise Goals   Increase Physical Activity Yes       Intervention Provide advice, education, support and counseling about physical activity/exercise needs.;Develop an individualized exercise prescription for aerobic and resistive training based on initial evaluation findings, risk stratification, comorbidities and participant's personal goals.       Expected Outcomes Short Term: Attend rehab on a regular basis to increase amount of physical activity.;Long Term: Add in home exercise to make exercise part of routine and to increase amount of physical activity.;Long Term: Exercising regularly at least 3-5 days a week.        Increase Strength and Stamina Yes       Intervention Provide advice, education, support and counseling about physical activity/exercise needs.;Develop an individualized exercise prescription for aerobic and resistive training based on initial evaluation findings, risk stratification, comorbidities and participant's personal goals.       Expected Outcomes Short Term: Increase workloads from initial exercise prescription for resistance, speed, and METs.;Short Term: Perform resistance training exercises routinely during rehab and add in resistance training at home;Long Term: Improve cardiorespiratory fitness, muscular endurance and strength as measured by increased METs and functional capacity (6MWT)       Able to understand and use rate of perceived exertion (RPE) scale Yes       Intervention Provide education and explanation on how to use RPE scale       Expected Outcomes Short Term: Able to use RPE daily in rehab to express subjective intensity level;Long Term:  Able to use RPE to guide intensity level when exercising independently       Able to understand and use Dyspnea scale Yes       Intervention Provide education and explanation on how to use Dyspnea scale       Expected Outcomes Short Term: Able to use Dyspnea scale daily in rehab to express subjective sense of shortness of breath during exertion;Long Term: Able to use Dyspnea scale to guide intensity level when exercising independently  Knowledge and understanding of Target Heart Rate Range (THRR) Yes       Intervention Provide education and explanation of THRR including how the numbers were predicted and where they are located for reference       Expected Outcomes Short Term: Able to state/look up THRR;Short Term: Able to use daily as guideline for intensity in rehab;Long Term: Able to use THRR to govern intensity when exercising independently       Able to check pulse independently Yes       Intervention Provide education and demonstration  on how to check pulse in carotid and radial arteries.;Review the importance of being able to check your own pulse for safety during independent exercise       Expected Outcomes Short Term: Able to explain why pulse checking is important during independent exercise;Long Term: Able to check pulse independently and accurately       Understanding of Exercise Prescription Yes       Intervention Provide education, explanation, and written materials on patient's individual exercise prescription       Expected Outcomes Short Term: Able to explain program exercise prescription;Long Term: Able to explain home exercise prescription to exercise independently                Exercise Goals Re-Evaluation :  Exercise Goals Re-Evaluation     Row Name 08/11/22 1035 08/17/22 1508 08/25/22 1034 08/30/22 1052 09/15/22 1618     Exercise Goal Re-Evaluation   Exercise Goals Review Able to understand and use rate of perceived exertion (RPE) scale;Able to understand and use Dyspnea scale;Knowledge and understanding of Target Heart Rate Range (THRR);Understanding of Exercise Prescription Understanding of Exercise Prescription;Increase Physical Activity;Increase Strength and Stamina Understanding of Exercise Prescription;Increase Physical Activity;Increase Strength and Stamina;Able to understand and use rate of perceived exertion (RPE) scale;Able to understand and use Dyspnea scale;Able to check pulse independently;Knowledge and understanding of Target Heart Rate Range (THRR) Increase Physical Activity;Increase Strength and Stamina;Understanding of Exercise Prescription Increase Physical Activity;Increase Strength and Stamina;Understanding of Exercise Prescription   Comments Reviewed RPE and dyspnea scales, THR and program prescription with pt today.  Pt voiced understanding and was given a copy of goals to take home. Izzy is off to a good start in rehab. She recently improved her overall average MET level to 2.65 METs.  She also did well on the treadmill at a speed of 2.2 mph and an incline of 1%. She tolerated 4 lb hand weights for resistance training as well. We will continue to monitor her progress in the program. Reviewed home exercise with pt today.  Pt plans to walk at home and at beach for exercise.  Reviewed THR, pulse, RPE, sign and symptoms, pulse oximetery and when to call 911 or MD.  Also discussed weather considerations and indoor options.  Pt voiced understanding. Taline is doing well for the first couple weeks she has been here for rehab. She has increased her treadmill speed to a 2.8 mph with a 1% incline.  She is also already up to level 5 on the XR using 4 lb handweights. We will continue to monitor as she progresses in the program. Javeria continues to do well in rehab. Her MET level has stayed consistently above 2.8 METs. She also has continued to walk on the treadmill at a speed of 2.8 mph and an incline of 1%. We will continue to monitor her progress in the program.   Expected Outcomes Short: Use RPE daily to regulate intensity. Long: Follow  program prescription in THR. Short: Continue to follow current exercise prescription. Long: Continue to increase strength and stamina. Short: Continue to walk on off days Long: Continue to improve stamina Short: Continue to increase worklod on treadmill Long: Continue to increase overall MET level Short: Continue to increase workloads as tolerated. Long: Continue to increase strength and stamina.    [place] Name 09/20/22 1355 09/28/22 1455 10/11/22 1343 10/14/22 1504 10/26/22 1440     Exercise Goal Re-Evaluation   Exercise Goals Review Increase Physical Activity;Increase Strength and Stamina;Understanding of Exercise Prescription Increase Physical Activity;Increase Strength and Stamina;Understanding of Exercise Prescription Increase Physical Activity;Increase Strength and Stamina;Understanding of Exercise Prescription Increase Physical Activity;Increase Strength and  Stamina;Understanding of Exercise Prescription Increase Physical Activity;Increase Strength and Stamina;Understanding of Exercise Prescription   Comments Janus is doing well with her exercise at home. She is walking outdoor for about 20-30 minutes. Encouraged to reach the minimum 30 minutes at once. She goes to the beach once per month and walks there as well. We reiterated the weather conditions now that it is getting colder out and to stay indoors when it gets colder out. She does check her HR with her watch and pulse ox but says it doesn't get up over 100 bpm. She doesn't normally hit her THR but is aware most likely from her medications. Tynia continues to do well in rehab.  She has increased to working 3 mph/1% incline on the treadmill! She is working at level 3 on the XR and could benefit from increasing her levels on both the T5 Nustep and REL. She is now using 5 lb handweights for resistance training as well. Will continue to monitor. Judi is doing well in rehab. She is staying busy at home and staying active.  She is is getting in some walking, but not enough exercise.  She does note that her stamina is improving overall. Yunuen continues to do well in rehab. She has consistently worked at an average of 2.7 METs. She also has done well on the XR at level 2 and the REL at level 1.5. We will continue to monitor her progress in the program. We will continue to monitor her progress in the program. Archer continues to do well. She is not quite hitting her THR but her RPEs are staying in appropriate range. Her treadmill workloads vary each session and will remind patient to progressively increase her levels. She did work up to 4 METs on the T4 Nustep which she has never done before. We will continue to monitor.   Expected Outcomes Short: Continue to hit 30 minutes with walking at home Long: Continue to exercise independently Short: Increase level on REL and T5 Long: Continue to increase overall MET level  Short: Make time to exercise Long; Conintue to improve stamina Short: Stay consistent with workload on the treadmill. Long: Continue to increase strength and stamina. Short: Continue to increase level on T4 Nustep Long: Continue to increase overall MET level            Discharge Exercise Prescription (Final Exercise Prescription Changes):  Exercise Prescription Changes - 10/26/22 1400       Response to Exercise   Blood Pressure (Admit) 130/66    Blood Pressure (Exit) 128/60    Heart Rate (Admit) 65 bpm    Heart Rate (Exercise) 89 bpm    Heart Rate (Exit) 66 bpm    Rating of Perceived Exertion (Exercise) 13    Symptoms none    Duration Continue with 30  min of aerobic exercise without signs/symptoms of physical distress.    Intensity THRR unchanged      Progression   Progression Continue to progress workloads to maintain intensity without signs/symptoms of physical distress.    Average METs 0.36      Resistance Training   Training Prescription Yes    Weight 5 lb    Reps 10-15      Interval Training   Interval Training No      Treadmill   MPH 2.7    Grade 2    Minutes 15    METs 3.81      NuStep   Level 4    Minutes 15    METs 4      Recumbant Elliptical   Level 1    Minutes 15      REL-XR   Level 3    Minutes 15      Home Exercise Plan   Plans to continue exercise at Home (comment)   walking, staff videos   Frequency Add 2 additional days to program exercise sessions.    Initial Home Exercises Provided 08/25/22      Oxygen   Maintain Oxygen Saturation 88% or higher             Nutrition:  Target Goals: Understanding of nutrition guidelines, daily intake of sodium <1537m, cholesterol <2031m calories 30% from fat and 7% or less from saturated fats, daily to have 5 or more servings of fruits and vegetables.  Education: All About Nutrition: -Group instruction provided by verbal, written material, interactive activities, discussions, models, and  posters to present general guidelines for heart healthy nutrition including fat, fiber, MyPlate, the role of sodium in heart healthy nutrition, utilization of the nutrition label, and utilization of this knowledge for meal planning. Follow up email sent as well. Written material given at graduation. Flowsheet [facility]  Education need identified 08/09/22       Biometrics:  Pre Biometrics - 08/09/22 1200       Pre Biometrics   Height 5' 5.5" (1.664 m)    Weight 194 lb 12.8 oz (88.4 kg)    Waist Circumference 38.5 inches    Hip Circumference 43.5 inches    Waist to Hip Ratio 0.89 %    BMI (Calculated) 31.91    Single Leg Stand 2.7 seconds              Nutrition Therapy Plan and Nutrition Goals:  Nutrition Therapy & Goals - 08/09/22 1123       Nutrition Therapy   Diet Heart healthy, low Na    Drug/Food Interactions Statins/Certain Fruits    Protein (specify units) 70-75g    Fiber 25 grams    Whole Grain Foods 3 servings    Saturated Fats 14 max. grams    Fruits and Vegetables 8 servings/day      Personal Nutrition Goals   Nutrition Goal ST: LT:    Comments [age].o. F admitted to cardiac rehab s/p CABG x3. PMHx includes CAD, hypercholesterolemia, HTN, paroxysmal a.fib. Relevant medications includes fluticasone, omeprazole, crestor. She has cut back on her portion sizes. L: leftovers (ex: chicken salad and some pasta salad) D: example steak tonight with potato and salad. Drinks: gatorade zero, sparkling, water. BoBritanyeports using olive oil and avocado oils with cooking, however, she will like to use butter, milk, and cheese with cooking - discussed some options to lower amount of high  fat dairy with cooking and encouraged to choose recipes with lower amounts of dairy at least 2-3 times per week. She reports that her lab values for sodium have been low and her MD recommended to include sodium in her diet. She checks her  BP regularly and it has been normal to high; sugested limiting Na to 2360m at the higher end to balance lower sodium numbers and elevated BP - informed her that we may need to make adjustments to this recommendation depeing on her lab and BP numbers moving foward. She reports liking pasta - encouaged her that she can still have pasta, but add lean protein and non-starchy vegetables so it is more balances and CHO do not take up most of the plate. Since she has decreased her portion sizes as well as starchy vegetables, discussed how heart healthy changes like increased non-starchy vegetables as a portion of the plate may cause her to be hungrier soon after eating and that healthy snacking may help to control hunger. Discussed heart healthy eating and MyPlate. BZendafeels that she has the tools she needs to achieve her goals with no major barriers reported.      Intervention Plan   Intervention Prescribe, educate and counsel regarding individualized specific dietary modifications aiming towards targeted core components such as weight, hypertension, lipid management, diabetes, heart failure and other comorbidities.;Nutrition handout(s) given to patient.    Expected Outcomes Short Term Goal: Understand basic principles of dietary content, such as calories, fat, sodium, cholesterol and nutrients.;Short Term Goal: A plan has been developed with personal nutrition goals set during dietitian appointment.;Long Term Goal: Adherence to prescribed nutrition plan.             Nutrition Assessments:  MEDIFICTS Score Key: ?70 Need to make dietary changes  40-70 Heart Healthy Diet ? 40 Therapeutic Level Cholesterol Diet  [facility]  Picture Your Plate Total Score on Admission 60      Picture Your Plate Scores: <<71Unhealthy dietary pattern with much room for improvement. 41-50 Dietary pattern unlikely to meet recommendations for good  health and room for improvement. 51-60 More healthful dietary pattern, with some room for improvement.  >60 Healthy dietary pattern, although there may be some specific behaviors that could be improved.    Nutrition Goals Re-Evaluation:  Nutrition Goals Re-Evaluation     ROldhamName 08/25/22 1026 09/20/22 1358 10/11/22 1347         Goals   Nutrition Goal Short Term Goal: Understand basic principles of dietary content, such as calories, fat, sodium, cholesterol and nutrients.; Short Term Goal: A plan has been developed with personal nutrition goals set during dietitian appointment.; Long Term Goal: Adherence to prescribed nutrition plan. Short Term Goal: Understand basic principles of dietary content, such as calories, fat, sodium, cholesterol and nutrients.; Short Term Goal: A plan has been developed with personal nutrition goals set during dietitian appointment.; Long Term Goal: Adherence to prescribed nutrition plan. Short: Talk to doctor about amount of sodium intake Long: Continue to eat heart healthy and follow RD recommendations     Comment BLailaniis doing well with her diet.  She has gotten better about not snacking after dinner. She is being more diligent about watching her sodium and reading food labels. She is still doing well with her portion control as she and her husband share meals to help. She is still getting more variety and trying to add in more vegetables to  meals too. She has started to use leaner proteins as well Jarah states she has not made any huge changes, however, she is avoiding fried foods and higher saturated foods. Her doctor told her from her recent bloodwork that her sodium is low and to eat more in her diet. Encouraged her to ask how much she should have to avoid an excess amount while managing her heart failure at the same time. She has her physical next week to review her bloodwork Loryn is doing well in rehab.  She is working on her diet.  She had a hot dog today but  planning on a salad next week.  Her sodium is still running low.  She is drinking 1/2 gatorade. We talked about mixing her gatorade with water to help add more too.  She is aiming to get in vareity in her diet.     Expected Outcome Short: Continue to add in vegetables Long: Conitnue to focus on heart healhty eating. Short: Talk to doctor about amount of sodium intake Long: Continue to eat heart healthy and follow RD recommendations Short: Continue watch sodium with doctor Long: Continue to eat heart healthy              Nutrition Goals Discharge (Final Nutrition Goals Re-Evaluation):  Nutrition Goals Re-Evaluation - 10/11/22 1347       Goals   Nutrition Goal Short: Talk to doctor about amount of sodium intake Long: Continue to eat heart healthy and follow RD recommendations    Comment Enza is doing well in rehab.  She is working on her diet.  She had a hot dog today but planning on a salad next week.  Her sodium is still running low.  She is drinking 1/2 gatorade. We talked about mixing her gatorade with water to help add more too.  She is aiming to get in vareity in her diet.    Expected Outcome Short: Continue watch sodium with doctor Long: Continue to eat heart healthy             Psychosocial: Target Goals: Acknowledge presence or absence of significant depression and/or stress, maximize coping skills, provide positive support system. Participant is able to verbalize types and ability to use techniques and skills needed for reducing stress and depression.   Education: Stress, Anxiety, and Depression - Group verbal and visual presentation to define topics covered.  Reviews how body is impacted by stress, anxiety, and depression.  Also discusses healthy ways to reduce stress and to treat/manage anxiety and depression.  Written material given at graduation. Flowsheet Row Cardiac Rehab from 09/01/2022 in [facility]  Education need identified 08/09/22        Education: Sleep Hygiene -Provides group verbal and written instruction about how sleep can affect your health.  Define sleep hygiene, discuss sleep cycles and impact of sleep habits. Review good sleep hygiene tips.    Initial Review & Psychosocial Screening:  Initial Psych Review & Screening - 08/02/22 1125       Initial Review   Current issues with None Identified      Family Dynamics   Good Support System? Yes    Comments Joesphine can look to her spouse family and freinds for support. She feels good about her health and is ready to make changes.      Barriers   Psychosocial barriers to participate in program The patient should benefit from training in stress management and relaxation.;There are no identifiable barriers or psychosocial needs.  Screening Interventions   Interventions Encouraged to exercise;To provide support and resources with identified psychosocial needs;Provide feedback about the scores to participant    Expected Outcomes Short Term goal: Utilizing psychosocial counselor, staff and physician to assist with identification of specific Stressors or current issues interfering with healing process. Setting desired goal for each stressor or current issue identified.;Long Term Goal: Stressors or current issues are controlled or eliminated.;Short Term goal: Identification and review with participant of any Quality of Life or Depression concerns found by scoring the questionnaire.;Long Term goal: The participant improves quality of Life and PHQ9 Scores as seen by post scores and/or verbalization of changes             Quality of Life Scores:   Quality of Life - 08/09/22 1200       Quality of Life   Select Quality of Life      Quality of Life Scores   Health/Function Pre 26 %    Socioeconomic Pre 23.75 %    Psych/Spiritual Pre 30 %    Family Pre 28.8 %    GLOBAL Pre 26.69 %            Scores of 19 and below usually indicate a poorer quality of life in  these areas.  A difference of  2-3 points is a clinically meaningful difference.  A difference of 2-3 points in the total score of the Quality of Life Index has been associated with significant improvement in overall quality of life, self-image, physical symptoms, and general health in studies assessing change in quality of life.  PHQ-9: Review Flowsheet  More data exists      08/09/2022 06/26/2021 02/21/2020 12/05/2018 09/26/2018  Depression screen PHQ 2/9  Decreased Interest 0 0 0 0 0  Down, Depressed, Hopeless 0 0 0 0 0  PHQ - 2 Score 0 0 0 0 0  Altered sleeping 0 - - - -  Tired, decreased energy 0 - - - -  Change in appetite 0 - - - -  Feeling bad or failure about yourself  0 - - - -  Trouble concentrating 0 - - - -  Moving slowly or fidgety/restless 0 - - - -  Suicidal thoughts 0 - - - -  PHQ-9 Score 0 - - - -  Difficult doing work/chores Not difficult at all - - - -   Interpretation of Total Score  Total Score Depression Severity:  1-4 = Minimal depression, 5-9 = Mild depression, 10-14 = Moderate depression, 15-19 = Moderately severe depression, 20-27 = Severe depression   Psychosocial Evaluation and Intervention:  Psychosocial Evaluation - 08/02/22 1127       Psychosocial Evaluation & Interventions   Interventions Encouraged to exercise with the program and follow exercise prescription;Relaxation education;Stress management education    Comments Carlisia can look to her spouse family and freinds for support. She feels good about her health and is ready to make changes.    Expected Outcomes Short: Start HeartTrack to help with mood. Long: Maintain a healthy mental state    Continue Psychosocial Services  Follow up required by staff             Psychosocial Re-Evaluation:  Psychosocial Re-Evaluation     [place] Name 08/25/22 1024 09/20/22 1406 10/11/22 1345         Psychosocial Re-Evaluation   Current issues with Current Stress Concerns Current Stress Concerns Current  Stress Concerns;None Identified     Comments Clorissa is doing well in  rehab. She has started back to work.  She is working from home so that is helping. She did need to switch her Mondays to an afternoon appt due to a work conference call.  She is sleeping until 330-4am and then wakes, unable to get back to sleep.  Her legs from surgery are painful and she is also waking with coughing and they are going to try to change something else will help. She seeing PA next week for that. Len is doing well mentally. 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 She may have a retirement party.  She cuts her phone off to dissociate away from work. She goes to the beach once per month which really helps her get her mind off things. Her birthday was a couple days ago and went to [facility] to celebrate! Her cough is completely gone as her doctor pinpointed which exact medication was causing that side effect. She feels much better. She did not sleep well last night but not normally it is not a problem. She'll continue to watch it and speak up if it becomes a problem. Infantof is doing well in rehab. She does not claim to have any stressors at this time.  She is counting down to retirement.  14 days left!  She is just letting things go at work and not worring about it.  She went to beach for Thanksgiving and enjoyed it. She plans to go back next week again.  She continues to sleep well.     Expected Outcomes Short; Figure which med is causing her coughing Long: Continue to cope with return to work. Short: Retire in December! Long: Continue to maintain positive attitude Short: Countdown to retirement Long: conitnue to stay positive     Interventions Stress management education;Encouraged to attend Cardiac Rehabilitation for the exercise Encouraged to attend Cardiac Rehabilitation for the exercise Encouraged to attend Cardiac Rehabilitation for the exercise      Continue Psychosocial Services  Follow up required by staff Follow up required by staff Follow up required by staff              Psychosocial Discharge (Final Psychosocial Re-Evaluation):  Psychosocial Re-Evaluation - 10/11/22 1345       Psychosocial Re-Evaluation   Current issues with Current Stress Concerns;None Identified    Comments Sonjia is doing well in rehab. She does not claim to have any stressors at this time.  She is counting down to retirement.  14 days left!  She is just letting things go at work and not worring about it.  She went to beach for Thanksgiving and enjoyed it. She plans to go back next week again.  She continues to sleep well.    Expected Outcomes Short: Countdown to retirement Long: conitnue to stay positive    Interventions Encouraged to attend Cardiac Rehabilitation for the exercise    Continue Psychosocial Services  Follow up required by staff             Vocational Rehabilitation: Provide vocational rehab assistance to qualifying candidates.   Vocational Rehab Evaluation & Intervention:   Education: Education Goals: Education classes will be provided on a variety of topics geared toward better understanding of heart health and risk factor modification. Participant will state understanding/return demonstration of topics presented as noted by education test scores.  Learning Barriers/Preferences:  Learning Barriers/Preferences - 08/02/22 1124       Learning  Barriers/Preferences   Learning Barriers None    Learning Preferences None             General Cardiac Education Topics:  AED/CPR: - Group verbal and written instruction with the use of models to demonstrate the basic use of the AED with the basic ABC's of resuscitation.   Anatomy and Cardiac Procedures: - Group verbal and visual presentation and models provide information about basic cardiac anatomy and function. Reviews the testing methods done to diagnose heart disease and the  outcomes of the test results. Describes the treatment choices: Medical Management, Angioplasty, or Coronary Bypass Surgery for treating various heart conditions including Myocardial Infarction, Angina, Valve Disease, and Cardiac Arrhythmias.  Written material given at graduation.   Medication Safety: - Group verbal and visual instruction to review commonly prescribed medications for heart and lung disease. Reviews the medication, class of the drug, and side effects. Includes the steps to properly store meds and maintain the prescription regimen.  Written material given at graduation.   Intimacy: - Group verbal instruction through game format to discuss how heart and lung disease can affect sexual intimacy. Written material given at graduation.. Flowsheet Row Cardiac Rehab from 09/01/2022 in [facility] Cardiac and Pulmonary Rehab  Education need identified 08/09/22       Know Your Numbers and Heart Failure: - Group verbal and visual instruction to discuss disease risk factors for cardiac and pulmonary disease and treatment options.  Reviews associated critical values for Overweight/Obesity, Hypertension, Cholesterol, and Diabetes.  Discusses basics of heart failure: signs/symptoms and treatments.  Introduces Heart Failure Zone chart for action plan for heart failure.  Written material given at graduation.   Infection Prevention: - Provides verbal and written material to individual with discussion of infection control including proper hand washing and proper equipment cleaning during exercise session. Flowsheet Row Cardiac Rehab from 09/01/2022 in [facility] Cardiac and Pulmonary Rehab  Date 08/02/22  Educator [facility]  Instruction Review Code 1- Verbalizes Understanding       Falls Prevention: - Provides verbal and written material to individual with discussion of falls prevention and safety. Flowsheet Row Cardiac Rehab from 09/01/2022 in [facility] Cardiac and Pulmonary Rehab  Date 08/02/22  Educator [facility]   Instruction Review Code 1- Verbalizes Understanding       Other: -Provides group and verbal instruction on various topics (see comments)   Knowledge Questionnaire Score:  Knowledge Questionnaire Score - 08/09/22 1201       Knowledge Questionnaire Score   Pre Score 23/26             Core Components/Risk Factors/Patient Goals at Admission:  Personal Goals and Risk Factors at Admission - 08/09/22 1202       Core Components/Risk Factors/Patient Goals on Admission    Weight Management Yes;Weight Loss;Obesity    Intervention Weight Management: Develop a combined nutrition and exercise program designed to reach desired caloric intake, while maintaining appropriate intake of nutrient and fiber, sodium and fats, and appropriate energy expenditure required for the weight goal.;Weight Management/Obesity: Establish reasonable short term and long term weight goals.;Weight Management: Provide education and appropriate resources to help participant work on and attain dietary goals.;Obesity: Provide education and appropriate resources to help participant work on and attain dietary goals.    Admit Weight 194 lb 12.8 oz (88.4 kg)    Goal Weight: Short Term 190 lb (86.2 kg)    Goal Weight: Long Term 185 lb (83.9 kg)    Expected Outcomes Short Term: Continue to assess and  modify interventions until short term weight is achieved;Long Term: Adherence to nutrition and physical activity/exercise program aimed toward attainment of established weight goal;Weight Loss: Understanding of general recommendations for a balanced deficit meal plan, which promotes 1-2 lb weight loss per week and includes a negative energy balance of [phone] kcal/d;Understanding recommendations for meals to include 15-35% energy as protein, 25-35% energy from fat, 35-60% energy from carbohydrates, less than 247m of dietary cholesterol, 20-35 gm of total fiber daily;Understanding of distribution of calorie intake throughout the day  with the consumption of 4-5 meals/snacks    Hypertension Yes    Intervention Provide education on lifestyle modifcations including regular physical activity/exercise, weight management, moderate sodium restriction and increased consumption of fresh fruit, vegetables, and low fat dairy, alcohol moderation, and smoking cessation.;Monitor prescription use compliance.    Expected Outcomes Short Term: Continued assessment and intervention until BP is < 140/92mHG in hypertensive participants. < 130/8021mG in hypertensive participants with diabetes, heart failure or chronic kidney disease.;Long Term: Maintenance of blood pressure at goal levels.    Lipids Yes    Intervention Provide education and support for participant on nutrition & aerobic/resistive exercise along with prescribed medications to achieve LDL <30m25mDL >40mg78m Expected Outcomes Short Term: Participant states understanding of desired cholesterol values and is compliant with medications prescribed. Participant is following exercise prescription and nutrition guidelines.;Long Term: Cholesterol controlled with medications as prescribed, with individualized exercise RX and with personalized nutrition plan. Value goals: LDL < 30mg,42m > 40 mg.             Education:Diabetes - Individual verbal and written instruction to review signs/symptoms of diabetes, desired ranges of glucose level fasting, after meals and with exercise. Acknowledge that pre and post exercise glucose checks will be done for 3 sessions at entry of program.   Core Components/Risk Factors/Patient Goals Review:   Goals and Risk Factor Review     Row Name 08/25/22 1029 09/20/22 1400 10/11/22 1350         Core Components/Risk Factors/Patient Goals Review   Personal Goals Review Weight Management/Obesity;Heart Failure;Hypertension;Lipids Weight Management/Obesity;Heart Failure;Hypertension;Lipids Weight Management/Obesity;Heart Failure;Hypertension;Lipids      Review BonnieAreyanaing well in rehab. Her weight will do well as long as she is not eating at the beach. Anytime she goes to beach, she notices her weight goes up.  She has not had any heart failure symtptoms.  Her pressures are doing well overall, but as they are working on her medications due to a cough she has noticed that it will go up and down.  She sees the PA again next week for next adjustment. BonnieKyrianues to do well. She states she has lost a few pounds but wants to lose more, no weight goal. She does weigh herself daily and is aware to be mindful of any sudden weight gain that could possibly be fluid. She denies heart failure symptoms. She is trying to avoid high saturated dats and fried foods to help with her weight loss. Her cough is gone as her doctor switched her medications around and found out which one was causing the cough. She has a physical next week to review her bloodwork as her doctor states she is anemic and has low sodium. They will discuss possible supplements/next stemps. BP at home is good- she has a log on her phone. They range [id number] and [id number]. Her doctor swtiched her BP meds 2 weeks ago and is dilligently watching her pressures  Ravleen is doing well in rehab. Her weight is holding steady and she would like to lose.  Her pressures are still a little low for her as she is dizzy in the morning.  She has not had any heart failure symptoms otherwise.  She is feeling better overall and looking forward to retirement in a few days.     Expected Outcomes Short: Continue to work on weight loss Long: Continue to improve stamina. Short: Talk to doctor about blood work (sodium and iron levels) Long: Continue to manage lifestyle risk factors Short: Continue to work on weight loss long: Conitnue to montior risk factors              Core Components/Risk Factors/Patient Goals at Discharge (Final Review):   Goals and Risk Factor Review - 10/11/22 1350       Core  Components/Risk Factors/Patient Goals Review   Personal Goals Review Weight Management/Obesity;Heart Failure;Hypertension;Lipids    Review Kambra is doing well in rehab. Her weight is holding steady and she would like to lose.  Her pressures are still a little low for her as she is dizzy in the morning.  She has not had any heart failure symptoms otherwise.  She is feeling better overall and looking forward to retirement in a few days.    Expected Outcomes Short: Continue to work on weight loss long: Conitnue to montior risk factors             ITP Comments:  ITP Comments     Row Name 08/02/22 1123 08/09/22 1154 08/11/22 0808 08/11/22 1035 09/08/22 1038   ITP Comments Virtual Visit completed. Patient informed on EP and RD appointment and 6 Minute walk test. Patient also informed of patient health questionnaires on My Chart. Patient Verbalizes understanding. Visit diagnosis can be found in [facility] 06/07/[facility]22. Completed 6MWT and gym orientation. Initial ITP created and sent for review to Dr. Emily Filbert, Medical Director. 30 Day review completed. Medical Director ITP review done, changes made as directed, and signed approval by Medical Director.   New to program First full day of exercise!  Patient was oriented to gym and equipment including functions, settings, policies, and procedures.  Patient's individual exercise prescription and treatment plan were reviewed.  All starting workloads were established based on the results of the 6 minute walk test done at initial orientation visit.  The plan for exercise progression was also introduced and progression will be customized based on patient's performance and goals. 30 Day review completed. Medical Director ITP review done, changes made as directed, and signed approval by Medical Director.    Gerald Name 10/06/22 1101 11/03/22 1128         ITP Comments 30 Day review completed. Medical Director ITP review done, changes made as directed, and signed approval  by Medical Director. 30 Day review completed. Medical Director ITP review done, changes made as directed, and signed approval by Medical Director.               Comments:

## 2022-11-03 NOTE — Progress Notes (Signed)
Daily Session Note  Patient Details  Name: Amanda Estrada MRN: [id number] Date of Birth: [birth year] Referring Provider:   Flowsheet Row Cardiac Rehab from 08/09/2022 in [facility] Cardiac and Pulmonary Rehab  Referring Provider Pleas Patricia MD       Encounter Date: 11/03/2022  Check In:  Session Check In - 11/03/22 0954       Check-In   Supervising physician immediately available to respond to emergencies See telemetry face sheet for immediately available ER MD    Location [facility]-Cardiac & Pulmonary Rehab    Staff Present Darlyne Russian, RN, ADN;Jessica Luan Pulling, MA, RCEP, CCRP, CCET;Noah Tickle, BS, Exercise Physiologist    Virtual Visit No    Medication changes reported     No    Fall or balance concerns reported    No    Warm-up and Cool-down Performed on first and last piece of equipment    Resistance Training Performed Yes    VAD Patient? No    PAD/SET Patient? No      Pain Assessment   Currently in Pain? No/denies                Social History   Tobacco Use  Smoking Status Never  Smokeless Tobacco Never    Goals Met:  Independence with exercise equipment Exercise tolerated well No report of concerns or symptoms today Strength training completed today  Goals Unmet:  Not Applicable  Comments: Pt able to follow exercise prescription today without complaint.  Will continue to monitor for progression.    Dr. Emily Filbert is Medical Director for [place].  Dr. Ottie Glazier is Medical Director for [facility] Pulmonary Rehabilitation.

## 2022-11-09 ENCOUNTER — Encounter: Payer: Self-pay | Admitting: Family Medicine

## 2022-11-09 ENCOUNTER — Telehealth (INDEPENDENT_AMBULATORY_CARE_PROVIDER_SITE_OTHER): Payer: BC Managed Care – PPO | Admitting: Family Medicine

## 2022-11-09 VITALS — BP 138/72 | HR 69 | Temp 99.3°F

## 2022-11-09 DIAGNOSIS — U071 COVID-19: Secondary | ICD-10-CM | POA: Insufficient documentation

## 2022-11-09 DIAGNOSIS — Z951 Presence of aortocoronary bypass graft: Secondary | ICD-10-CM

## 2022-11-09 MED ORDER — GUAIFENESIN-CODEINE 100-10 MG/5ML PO SYRP: 5.0000 mL | ORAL_SOLUTION | Freq: Every evening | ORAL | 0 refills | Status: DC | PRN

## 2022-11-09 MED ORDER — QVAR REDIHALER 80 MCG/ACT IN AERB: 1.0000 | INHALATION_SPRAY | Freq: Two times a day (BID) | RESPIRATORY_TRACT | 0 refills | Status: DC

## 2022-11-09 MED ORDER — MOLNUPIRAVIR EUA 200MG CAPSULE: 4.0000 | ORAL_CAPSULE | Freq: Two times a day (BID) | ORAL | 0 refills | Status: AC

## 2022-11-09 NOTE — Telephone Encounter (Addendum)
I spoke with pt whose symptoms started on 11/07/22 with Headache pain level 10, tired, no energy and fever. On 11/08/22 pt had prod cough with clear to green phlegm. Today pt is in no distress with her breathing at this time. Earlier today pt did have some wheezing and inhaler helped with the wheezing. Pt has scratchy throat and H/A today is at pain level 5. No CP or SOB. Pt scheduled video visit 11/09/22 at 4:20 with UC & ED precautions and pt voiced understanding. Pt does not want to sit and wait long time at [facility] and is very appreciative of VV by Dr Diona Browner. Sending note to Dr Diona Browner and [place] [facility].

## 2022-11-09 NOTE — Progress Notes (Signed)
Subjective:  Patient ID: Amanda Estrada, female    DOB: November 10, 1960,  MRN: [id number]  Chief Complaint  Patient presents with   Foot Pain    62 y.o. female presents with the above complaint.  Patient presents with new complaint of right posterior Achilles pain.  Patient states has been going for 3 months has progressive gotten worse and causing her some discomfort.  She went to get it evaluated she has not seen anyone else prior to seeing me for this.  Hurts with ambulation hurts with pressure to the back of the heel.  Pain scale 7 out of 10   Review of Systems: Negative except as noted in the HPI. Denies N/V/F/Ch.  Past Medical History:  Diagnosis Date   Allergy    Anxiety    patient denies   Asthma    Cataract    Dysplastic nevus 08/28/2018   R paraspinal mid to upper back - severe, excision 11/21/2018   Dysplastic nevus 08/28/2018   L lat breast sup - moderate   Dysplastic nevus 07/02/2019   R mid to low back 4.0 cm lat to spine - moderate   Dysplastic nevus 07/02/2019   R inframammary - moderate   GERD (gastroesophageal reflux disease)    Glaucoma    History of echocardiogram    a. 11/2015: EF 55-60%, no RWMA, nl LV diastolic fxn, PASP nl   Hypertension    Obesity    PAF (paroxysmal atrial fibrillation) ([place])    a. CHADS2VASc = 2 (HTN, sex category)   Sinusitis 2011   Recurrent per Dr. Tami Ribas with [facility]    Current Outpatient Medications:    albuterol (VENTOLIN HFA) 108 (90 Base) MCG/ACT inhaler, Inhale 2 puffs into the lungs every 4 (four) hours as needed for wheezing or shortness of breath., Disp: 8 g, Rfl: 2   amiodarone (PACERONE) 200 MG tablet, Take by mouth., Disp: , Rfl:    apixaban (ELIQUIS) 5 MG TABS tablet, Take 1 tablet (5 mg total) by mouth 2 (two) times daily., Disp: 60 tablet, Rfl: 11   aspirin EC 81 MG tablet, Take by mouth., Disp: , Rfl:    beclomethasone (QVAR) 80 MCG/ACT inhaler, Inhale 1 puff into the lungs in the morning and at bedtime.,  Disp: 1 each, Rfl: 12   bisoprolol (ZEBETA) 10 MG tablet, Take 1 tablet (10 mg total) by mouth daily., Disp: 90 tablet, Rfl: 3   cloNIDine (CATAPRES) 0.1 MG tablet, TAKE 1 TABLET BY MOUTH EVERYDAY AT BEDTIME, Disp: 30 tablet, Rfl: 1   diltiazem (CARDIZEM CD) 240 MG 24 hr capsule, TAKE 1 CAPSULE BY MOUTH EVERY DAY, Disp: 90 capsule, Rfl: 3   fluticasone (FLONASE) 50 MCG/ACT nasal spray, PLACE 1 SPRAY INTO BOTH NOSTRILS DAILY AS NEEDED FOR ALLERGIES., Disp: 48 mL, Rfl: 3   levocetirizine (XYZAL) 5 MG tablet, Take 5 mg by mouth at bedtime. , Disp: , Rfl:    losartan (COZAAR) 50 MG tablet, Take 50 mg by mouth daily., Disp: , Rfl:    montelukast (SINGULAIR) 10 MG tablet, TAKE 1 TABLET BY MOUTH EVERYDAY AT BEDTIME, Disp: 90 tablet, Rfl: 3   omeprazole (PRILOSEC) 40 MG capsule, Take 1 capsule (40 mg total) by mouth 2 (two) times daily., Disp: 180 capsule, Rfl: 3   rosuvastatin (CRESTOR) 40 MG tablet, Take 40 mg by mouth daily., Disp: , Rfl:   Social History   Tobacco Use  Smoking Status Never  Smokeless Tobacco Never    Allergies  Allergen Reactions  Amoxicillin-Pot Clavulanate Other (See Comments)    Diarrhea Has patient had a PCN reaction causing immediate rash, facial/tongue/throat swelling, SOB or lightheadedness with hypotension: No Has patient had a PCN reaction causing severe rash involving mucus membranes or skin necrosis: No Has patient had a PCN reaction that required hospitalization: No Has patient had a PCN reaction occurring within the last 10 years: Yes--diarrhea ONLY If all of the above answers are "NO", then may proceed with Cephalosporin use.    Objective:  There were no vitals filed for this visit. There is no height or weight on file to calculate BMI. Constitutional Well developed. Well nourished.  Vascular Dorsalis pedis pulses palpable bilaterally. Posterior tibial pulses palpable bilaterally. Capillary refill normal to all digits.  No cyanosis or clubbing  noted. Pedal hair growth normal.  Neurologic Normal speech. Oriented to person, place, and time. Epicritic sensation to light touch grossly present bilaterally.  Dermatologic Nails well groomed and normal in appearance. No open wounds. No skin lesions.  Orthopedic: Pain on palpation to the right Achilles tendon insertion.  Positive Haglund's deformity noted.  Positive Silfverskiold test noted with gastrocnemius equinus.  Positive with pain with dorsiflexion of the foot no pain with plantarflexion of the foot.  No pain at the peroneal tendon posterior tibial tendon ATFL ligament   Radiographs: None Assessment:   1. Achilles tendinitis, right leg   2. Pes planovalgus    Plan:  Patient was evaluated and treated and all questions answered.  Right Achilles tendinitis insertional pain -All questions and concerns were discussed with the patient in extensive detail -Given the amount of pain that she is having she will benefit from a cam boot immobilization to help decrease the acute inflammatory component associate with pain. -I will discuss steroid injection versus an MRI during next clinical visit if there is no improvement.  Pes planovalgus -I explained to patient the etiology of pes planovalgus and relationship with Achilles tendinitis and various treatment options were discussed.  Given patient foot structure in the setting of Achilles to tinnitus I believe patient will benefit from custom-made orthotics to help control the hindfoot motion support the arch of the foot and take the stress away from plantar fascial.  Patient agrees with the plan like to proceed with orthotics -Patient was casted for orthotics with heel lift   No follow-ups on file.

## 2022-11-09 NOTE — Progress Notes (Signed)
VIRTUAL VISIT A virtual visit is felt to be most appropriate for this patient at this time.   I connected with the patient on 11/09/22 at  4:20 PM EST by virtual telehealth platform and verified that I am speaking with the correct person using two identifiers.   I discussed the limitations, risks, security and privacy concerns of performing an evaluation and management service by  virtual telehealth platform and the availability of in person appointments. I also discussed with the patient that there may be a patient responsible charge related to this service. The patient expressed understanding and agreed to proceed.  Patient location: Home Provider Location: [place] [facility] Participants: Eliezer Lofts and Renaldo Harrison   Chief Complaint  Patient presents with   Covid Positive    11/08/22   Cough   Nasal Congestion    W/headache   Sore Throat    pnd    History of Present Illness: 62 year old female patient with history of  atrial fibrillation,  Date of onset: 11/08/2023 Initial symptoms included PND, ST, headache, nasal congestion Symptoms progressed to fever 100.56F, cough, sneezing. No body ache.  No ear pain, no facial pain.  No SOB, minimal wheeze.  Using albuterol inhaler once daily prn.  No currently using Qvar.    Sick contacts: none COVID testing:   none     She has tried to treat with tylenol,  Coricidin.     No history of chronic lung disease such as asthma or COPD. Non-smoker.   Good fluid intake.   COVID 19 screen No recent travel or known exposure to COVID19 The patient denies respiratory symptoms of COVID 19 at this time.  The importance of social distancing was discussed today.   Review of Systems  Constitutional:  Negative for chills and fever.  HENT:  Positive for congestion. Negative for ear pain.   Eyes:  Negative for pain and redness.  Respiratory:  Positive for cough. Negative for shortness of breath.   Cardiovascular:  Negative for  chest pain, palpitations and leg swelling.  Gastrointestinal:  Negative for abdominal pain, blood in stool, constipation, diarrhea, nausea and vomiting.  Genitourinary:  Negative for dysuria.  Musculoskeletal:  Negative for falls and myalgias.  Skin:  Negative for rash.  Neurological:  Negative for dizziness.  Psychiatric/Behavioral:  Negative for depression. The patient is not nervous/anxious.       Past Medical History:  Diagnosis Date   Allergy    Anxiety    patient denies   Asthma    Cataract    Dysplastic nevus 08/28/2018   R paraspinal mid to upper back - severe, excision 11/21/2018   Dysplastic nevus 08/28/2018   L lat breast sup - moderate   Dysplastic nevus 07/02/2019   R mid to low back 4.0 cm lat to spine - moderate   Dysplastic nevus 07/02/2019   R inframammary - moderate   GERD (gastroesophageal reflux disease)    Glaucoma    History of echocardiogram    a. 11/2015: EF 55-60%, no RWMA, nl LV diastolic fxn, PASP nl   Hypertension    Obesity    PAF (paroxysmal atrial fibrillation) ([place])    a. CHADS2VASc = 2 (HTN, sex category)   Sinusitis 2011   Recurrent per Dr. Tami Ribas with [facility]    reports that she has never smoked. She has never used smokeless tobacco. She reports current alcohol use of about 14.0 standard drinks of alcohol per week. She reports that she  does not use drugs.   Current Outpatient Medications:    albuterol (VENTOLIN HFA) 108 (90 Base) MCG/ACT inhaler, Inhale 2 puffs into the lungs every 4 (four) hours as needed for wheezing or shortness of breath., Disp: 8 g, Rfl: 2   amiodarone (PACERONE) 200 MG tablet, Take by mouth., Disp: , Rfl:    apixaban (ELIQUIS) 5 MG TABS tablet, Take 1 tablet (5 mg total) by mouth 2 (two) times daily., Disp: 60 tablet, Rfl: 11   aspirin EC 81 MG tablet, Take by mouth., Disp: , Rfl:    beclomethasone (QVAR) 80 MCG/ACT inhaler, Inhale 1 puff into the lungs in the morning and at bedtime., Disp: 1 each, Rfl: 12    bisoprolol (ZEBETA) 10 MG tablet, Take 1 tablet (10 mg total) by mouth daily., Disp: 90 tablet, Rfl: 3   cloNIDine (CATAPRES) 0.1 MG tablet, TAKE 1 TABLET BY MOUTH EVERYDAY AT BEDTIME, Disp: 30 tablet, Rfl: 1   diltiazem (CARDIZEM CD) 240 MG 24 hr capsule, TAKE 1 CAPSULE BY MOUTH EVERY DAY, Disp: 90 capsule, Rfl: 3   fluticasone (FLONASE) 50 MCG/ACT nasal spray, PLACE 1 SPRAY INTO BOTH NOSTRILS DAILY AS NEEDED FOR ALLERGIES., Disp: 48 mL, Rfl: 3   levocetirizine (XYZAL) 5 MG tablet, Take 5 mg by mouth at bedtime. , Disp: , Rfl:    lisinopril (ZESTRIL) 20 MG tablet, Take 20 mg by mouth daily., Disp: , Rfl:    losartan (COZAAR) 50 MG tablet, Take 50 mg by mouth daily., Disp: , Rfl:    montelukast (SINGULAIR) 10 MG tablet, TAKE 1 TABLET BY MOUTH EVERYDAY AT BEDTIME, Disp: 90 tablet, Rfl: 3   omeprazole (PRILOSEC) 40 MG capsule, Take 1 capsule (40 mg total) by mouth 2 (two) times daily., Disp: 180 capsule, Rfl: 3   rosuvastatin (CRESTOR) 40 MG tablet, Take 40 mg by mouth daily., Disp: , Rfl:    Observations/Objective: Blood pressure 138/72, pulse 69, temperature 99.3 F (37.4 C), temperature source Temporal, SpO2 98 %.  Physical Exam  Physical Exam Constitutional:      General: The patient is not in acute distress. Pulmonary:     Effort: Pulmonary effort is normal. No respiratory distress.  Neurological:     Mental Status: The patient is alert and oriented to person, place, and time.  Psychiatric:        Mood and Affect: Mood normal.        Behavior: Behavior normal.   Assessment and Plan Problem List Items Addressed This Visit     COVID-19 - Primary    COVID19  Infection < 5 days from onset of symptoms invaccinated overweight individual with history of atrial fibrillation and asthma  No clear sign of bacterial infection at this time.   No SOB.  No red flags/need for ER visit or in-person exam at respiratory clinic at this time..    Pt higher risk for COVID complications given atrial  fibrillation and asthma. Paxlovid contraindicated given on Eliquis anticoagulation.  5 day course of molnupiravir sent. Reviewed course of medication and side effect profile with patient in detail.   Symptomatic care with mucinex and cough suppressant at night. If SOB begins symptoms worsening.. have low threshold for in-person exam, if severe shortness of breath ER visit recommended. No clear evidence of current asthma exacerbation but I have sent in Qvar for her to start given minimal wheezing.  If her symptoms progress and she is increasing her need for albuterol inhaler she will call for local prednisone taper.  Can monitor Oxygen saturation at home with home monitor if able to obtain.  Go to ER if O2 sat < 90% on room air.   Reviewed home care and provided information through [place].  Recommended quarantine 5 days isolation recommended. Return to work day 6 and wear mask for 4 more days to complete 10 days. Provided info about prevention of spread of COVID 19.       Relevant Medications   molnupiravir EUA (LAGEVRIO) 200 mg CAPS capsule      I discussed the assessment and treatment plan with the patient. The patient was provided an opportunity to ask questions and all were answered. The patient agreed with the plan and demonstrated an understanding of the instructions.   The patient was advised to call back or seek an in-person evaluation if the symptoms worsen or if the condition fails to improve as anticipated.     Eliezer Lofts, MD

## 2022-11-09 NOTE — Telephone Encounter (Signed)
This patient has asthma, she needs to have an office visit likely today for assessment and likely treatment with antiviral.  Please call to triage to make sure no emergent symptoms.  Otherwise it is okay to add her onto my afternoon schedule as a video visit if she is agreeable

## 2022-11-09 NOTE — Assessment & Plan Note (Signed)
COVID19  Infection < 5 days from onset of symptoms invaccinated overweight individual with history of atrial fibrillation and asthma  No clear sign of bacterial infection at this time.   No SOB.  No red flags/need for ER visit or in-person exam at respiratory clinic at this time..    Pt higher risk for COVID complications given atrial fibrillation and asthma. Paxlovid contraindicated given on Eliquis anticoagulation.  5 day course of molnupiravir sent. Reviewed course of medication and side effect profile with patient in detail.   Symptomatic care with mucinex and cough suppressant at night. If SOB begins symptoms worsening.. have low threshold for in-person exam, if severe shortness of breath ER visit recommended. No clear evidence of current asthma exacerbation but I have sent in Qvar for her to start given minimal wheezing.  If her symptoms progress and she is increasing her need for albuterol inhaler she will call for local prednisone taper.  Can monitor Oxygen saturation at home with home monitor if able to obtain.  Go to ER if O2 sat < 90% on room air.   Reviewed home care and provided information through [place].  Recommended quarantine 5 days isolation recommended. Return to work day 6 and wear mask for 4 more days to complete 10 days. Provided info about prevention of spread of COVID 19.

## 2022-11-18 ENCOUNTER — Other Ambulatory Visit: Payer: Self-pay | Admitting: Cardiology

## 2022-11-19 MED ORDER — PREDNISONE 20 MG PO TABS: ORAL_TABLET | ORAL | 0 refills | Status: DC

## 2022-11-19 NOTE — Addendum Note (Signed)
Addended by: Kerby Nora E on: 11/19/2022 01:46 PM   Modules accepted: Orders

## 2022-11-24 ENCOUNTER — Encounter: Payer: BC Managed Care – PPO | Attending: Cardiovascular Disease | Admitting: *Deleted

## 2022-11-24 DIAGNOSIS — Z951 Presence of aortocoronary bypass graft: Secondary | ICD-10-CM | POA: Insufficient documentation

## 2022-11-24 NOTE — Progress Notes (Signed)
Daily Session Note  Patient Details  Name: Amanda Estrada MRN: [id number] Date of Birth: 07/02/1961 Referring Provider:   Flowsheet Row Cardiac Rehab from 08/09/2022 in [facility] Cardiac and Pulmonary Rehab  Referring Provider Deberah Castle MD       Encounter Date: 11/24/2022  Check In:  Session Check In - 11/24/22 0958       Check-In   Supervising physician immediately available to respond to emergencies See telemetry face sheet for immediately available ER MD    Location [facility]-Cardiac & Pulmonary Rehab    Staff Present Lanny Hurst, RN, ADN;Joseph Hood, RCP,RRT,BSRT;Noah Tickle, BS, Exercise Physiologist    Virtual Visit No    Medication changes reported     No    Fall or balance concerns reported    No    Tobacco Cessation No Change    Warm-up and Cool-down Performed on first and last piece of equipment    Resistance Training Performed Yes    VAD Patient? No    PAD/SET Patient? No      Pain Assessment   Currently in Pain? No/denies                Social History   Tobacco Use  Smoking Status Never  Smokeless Tobacco Never    Goals Met:  Independence with exercise equipment Exercise tolerated well No report of concerns or symptoms today Strength training completed today  Goals Unmet:  Not Applicable  Comments: Pt able to follow exercise prescription today without complaint.  Will continue to monitor for progression.    Dr. Bethann Punches is Medical Director for [facility] Cardiac Rehabilitation.  Dr. Vida Rigger is Medical Director for [facility] Pulmonary Rehabilitation.

## 2022-11-26 ENCOUNTER — Ambulatory Visit (INDEPENDENT_AMBULATORY_CARE_PROVIDER_SITE_OTHER): Payer: BC Managed Care – PPO | Admitting: Podiatry

## 2022-11-26 VITALS — BP 128/68

## 2022-11-26 DIAGNOSIS — M7661 Achilles tendinitis, right leg: Secondary | ICD-10-CM

## 2022-11-26 DIAGNOSIS — Q666 Other congenital valgus deformities of feet: Secondary | ICD-10-CM

## 2022-11-26 NOTE — Progress Notes (Signed)
Subjective:  Patient ID: Amanda Estrada, female    DOB: 1961/03/29,  MRN: [id number]  Chief Complaint  Patient presents with   Foot Pain    62 y.o. female presents with the above complaint.  Patient presents with follow-up of right Achilles tendinitis.  She states that she is doing a little bit better.  The cam boot immobilization helped.  She is here for next treatment plan.   Review of Systems: Negative except as noted in the HPI. Denies N/V/F/Ch.  Past Medical History:  Diagnosis Date   Allergy    Anxiety    patient denies   Asthma    Cataract    Dysplastic nevus 08/28/2018   R paraspinal mid to upper back - severe, excision 11/21/2018   Dysplastic nevus 08/28/2018   L lat breast sup - moderate   Dysplastic nevus 07/02/2019   R mid to low back 4.0 cm lat to spine - moderate   Dysplastic nevus 07/02/2019   R inframammary - moderate   GERD (gastroesophageal reflux disease)    Glaucoma    History of echocardiogram    a. 11/2015: EF 55-60%, no RWMA, nl LV diastolic fxn, PASP nl   Hypertension    Obesity    PAF (paroxysmal atrial fibrillation) (HCC)    a. CHADS2VASc = 2 (HTN, sex category)   Sinusitis 2011   Recurrent per Dr. Jenne Campus with  ENT    Current Outpatient Medications:    albuterol (VENTOLIN HFA) 108 (90 Base) MCG/ACT inhaler, Inhale 2 puffs into the lungs every 4 (four) hours as needed for wheezing or shortness of breath., Disp: 8 g, Rfl: 2   amiodarone (PACERONE) 200 MG tablet, Take by mouth., Disp: , Rfl:    apixaban (ELIQUIS) 5 MG TABS tablet, Take 1 tablet (5 mg total) by mouth 2 (two) times daily., Disp: 60 tablet, Rfl: 11   aspirin EC 81 MG tablet, Take by mouth., Disp: , Rfl:    beclomethasone (QVAR REDIHALER) 80 MCG/ACT inhaler, Inhale 1 puff into the lungs 2 (two) times daily., Disp: 1 each, Rfl: 0   beclomethasone (QVAR) 80 MCG/ACT inhaler, Inhale 1 puff into the lungs in the morning and at bedtime., Disp: 1 each, Rfl: 12   bisoprolol  (ZEBETA) 10 MG tablet, Take 1 tablet (10 mg total) by mouth daily., Disp: 90 tablet, Rfl: 3   cloNIDine (CATAPRES) 0.1 MG tablet, TAKE 1 TABLET BY MOUTH EVERYDAY AT BEDTIME, Disp: 90 tablet, Rfl: 1   diltiazem (CARDIZEM CD) 240 MG 24 hr capsule, TAKE 1 CAPSULE BY MOUTH EVERY DAY, Disp: 90 capsule, Rfl: 3   fluticasone (FLONASE) 50 MCG/ACT nasal spray, PLACE 1 SPRAY INTO BOTH NOSTRILS DAILY AS NEEDED FOR ALLERGIES., Disp: 48 mL, Rfl: 3   guaiFENesin-codeine (ROBITUSSIN AC) 100-10 MG/5ML syrup, Take 5-10 mLs by mouth at bedtime as needed for cough., Disp: 180 mL, Rfl: 0   levocetirizine (XYZAL) 5 MG tablet, Take 5 mg by mouth at bedtime. , Disp: , Rfl:    lisinopril (ZESTRIL) 20 MG tablet, Take 20 mg by mouth daily., Disp: , Rfl:    losartan (COZAAR) 50 MG tablet, Take 50 mg by mouth daily., Disp: , Rfl:    montelukast (SINGULAIR) 10 MG tablet, TAKE 1 TABLET BY MOUTH EVERYDAY AT BEDTIME, Disp: 90 tablet, Rfl: 3   omeprazole (PRILOSEC) 40 MG capsule, Take 1 capsule (40 mg total) by mouth 2 (two) times daily., Disp: 180 capsule, Rfl: 3   predniSONE (DELTASONE) 20 MG tablet, 3 tabs  by mouth daily x 3 days, then 2 tabs by mouth daily x 2 days then 1 tab by mouth daily x 2 days, Disp: 15 tablet, Rfl: 0   rosuvastatin (CRESTOR) 40 MG tablet, Take 40 mg by mouth daily., Disp: , Rfl:   Social History   Tobacco Use  Smoking Status Never  Smokeless Tobacco Never    Allergies  Allergen Reactions   Amoxicillin-Pot Clavulanate Other (See Comments)    Diarrhea Has patient had a PCN reaction causing immediate rash, facial/tongue/throat swelling, SOB or lightheadedness with hypotension: No Has patient had a PCN reaction causing severe rash involving mucus membranes or skin necrosis: No Has patient had a PCN reaction that required hospitalization: No Has patient had a PCN reaction occurring within the last 10 years: Yes--diarrhea ONLY If all of the above answers are "NO", then may proceed with Cephalosporin  use.    Objective:   Vitals:   11/26/22 1041  BP: 128/68   There is no height or weight on file to calculate BMI. Constitutional Well developed. Well nourished.  Vascular Dorsalis pedis pulses palpable bilaterally. Posterior tibial pulses palpable bilaterally. Capillary refill normal to all digits.  No cyanosis or clubbing noted. Pedal hair growth normal.  Neurologic Normal speech. Oriented to person, place, and time. Epicritic sensation to light touch grossly present bilaterally.  Dermatologic Nails well groomed and normal in appearance. No open wounds. No skin lesions.  Orthopedic: Pain on palpation to the right Achilles tendon insertion.  Positive Haglund's deformity noted.  Positive Silfverskiold test noted with gastrocnemius equinus.  Positive with pain with dorsiflexion of the foot no pain with plantarflexion of the foot.  No pain at the peroneal tendon posterior tibial tendon ATFL ligament   Radiographs: None Assessment:   No diagnosis found.  Plan:  Patient was evaluated and treated and all questions answered.  Right Achilles tendinitis insertional pain -All questions and concerns were discussed with the patient in extensive detail -Clinically cam boot immobilization helped.  Therefore I believe she will benefit from steroid injection help decrease acute inflammatory component associate with pain.  I discussed the risk of rupture associated with it.  She states understanding like to proceed despite the risks. -A steroid injection was performed at right Kager's fat pad using 1% plain Lidocaine and 10 mg of Kenalog. This was well tolerated. -Tri-Lock ankle brace was dispensed for transition   Pes planovalgus -I explained to patient the etiology of pes planovalgus and relationship with Achilles tendinitis and various treatment options were discussed.  Given patient foot structure in the setting of Achilles to tinnitus I believe patient will benefit from custom-made  orthotics to help control the hindfoot motion support the arch of the foot and take the stress away from plantar fascial.  Patient agrees with the plan like to proceed with orthotics -She is awaiting orthotics   No follow-ups on file.

## 2022-12-01 ENCOUNTER — Encounter: Payer: Self-pay | Admitting: *Deleted

## 2022-12-01 DIAGNOSIS — Z951 Presence of aortocoronary bypass graft: Secondary | ICD-10-CM

## 2022-12-01 NOTE — Progress Notes (Signed)
Cardiac Individual Treatment Plan  Patient Details  Name: Amanda Estrada MRN: [id number] Date of Birth: [birth year] Referring Provider:   Flowsheet Row Cardiac Rehab from 08/09/2022 in [facility] Cardiac and Pulmonary Rehab  Referring Provider Pleas Patricia MD       Initial Encounter Date:  Flowsheet Row Cardiac Rehab from 08/09/2022 in [facility] Cardiac and Pulmonary Rehab  Date 08/09/22       Visit Diagnosis: S/P CABG x 3  Patient's Home Medications on Admission:  Current Outpatient Medications:    albuterol (VENTOLIN HFA) 108 (90 Base) MCG/ACT inhaler, Inhale 2 puffs into the lungs every 4 (four) hours as needed for wheezing or shortness of breath., Disp: 8 g, Rfl: 2   amiodarone (PACERONE) 200 MG tablet, Take by mouth., Disp: , Rfl:    apixaban (ELIQUIS) 5 MG TABS tablet, Take 1 tablet (5 mg total) by mouth 2 (two) times daily., Disp: 60 tablet, Rfl: 11   aspirin EC 81 MG tablet, Take by mouth., Disp: , Rfl:    beclomethasone (QVAR REDIHALER) 80 MCG/ACT inhaler, Inhale 1 puff into the lungs 2 (two) times daily., Disp: 1 each, Rfl: 0   beclomethasone (QVAR) 80 MCG/ACT inhaler, Inhale 1 puff into the lungs in the morning and at bedtime., Disp: 1 each, Rfl: 12   bisoprolol (ZEBETA) 10 MG tablet, Take 1 tablet (10 mg total) by mouth daily., Disp: 90 tablet, Rfl: 3   cloNIDine (CATAPRES) 0.1 MG tablet, TAKE 1 TABLET BY MOUTH EVERYDAY AT BEDTIME, Disp: 90 tablet, Rfl: 1   diltiazem (CARDIZEM CD) 240 MG 24 hr capsule, TAKE 1 CAPSULE BY MOUTH EVERY DAY, Disp: 90 capsule, Rfl: 3   fluticasone (FLONASE) 50 MCG/ACT nasal spray, PLACE 1 SPRAY INTO BOTH NOSTRILS DAILY AS NEEDED FOR ALLERGIES., Disp: 48 mL, Rfl: 3   guaiFENesin-codeine (ROBITUSSIN AC) 100-10 MG/5ML syrup, Take 5-10 mLs by mouth at bedtime as needed for cough., Disp: 180 mL, Rfl: 0   levocetirizine (XYZAL) 5 MG tablet, Take 5 mg by mouth at bedtime. , Disp: , Rfl:    lisinopril (ZESTRIL) 20 MG tablet, Take 20 mg by mouth daily.,  Disp: , Rfl:    losartan (COZAAR) 50 MG tablet, Take 50 mg by mouth daily., Disp: , Rfl:    montelukast (SINGULAIR) 10 MG tablet, TAKE 1 TABLET BY MOUTH EVERYDAY AT BEDTIME, Disp: 90 tablet, Rfl: 3   omeprazole (PRILOSEC) 40 MG capsule, Take 1 capsule (40 mg total) by mouth 2 (two) times daily., Disp: 180 capsule, Rfl: 3   predniSONE (DELTASONE) 20 MG tablet, 3 tabs by mouth daily x 3 days, then 2 tabs by mouth daily x 2 days then 1 tab by mouth daily x 2 days, Disp: 15 tablet, Rfl: 0   rosuvastatin (CRESTOR) 40 MG tablet, Take 40 mg by mouth daily., Disp: , Rfl:   Past Medical History: Past Medical History:  Diagnosis Date   Allergy    Anxiety    patient denies   Asthma    Cataract    Dysplastic nevus 08/28/2018   R paraspinal mid to upper back - severe, excision 11/21/2018   Dysplastic nevus 08/28/2018   L lat breast sup - moderate   Dysplastic nevus 07/02/2019   R mid to low back 4.0 cm lat to spine - moderate   Dysplastic nevus 07/02/2019   R inframammary - moderate   GERD (gastroesophageal reflux disease)    Glaucoma    History of echocardiogram    a. 11/2015: EF 55-60%, no RWMA,  nl LV diastolic fxn, PASP nl   Hypertension    Obesity    PAF (paroxysmal atrial fibrillation) ([place])    a. CHADS2VASc = 2 (HTN, sex category)   Sinusitis 2011   Recurrent per Dr. Tami Ribas with [facility]    Tobacco Use: Social History   Tobacco Use  Smoking Status Never  Smokeless Tobacco Never    Labs: Review Flowsheet  More data exists      Latest Ref Rng & Units 10/14/2017 12/01/2018 02/18/2020 05/27/2021 09/17/2022  Labs for ITP Cardiac and Pulmonary Rehab  Cholestrol 0 - 200 mg/dL 155  158  176  160  161   LDL (calc) 0 - 99 mg/dL 48  43  62  60  45   HDL-C >39.00 mg/dL 95.40  104.90  94.50  76.60  97.60   Trlycerides 0.0 - 149.0 mg/dL 62.0  51.0  96.0  117.0  91.0      Exercise Target Goals: Exercise Program Goal: Individual exercise prescription set using results from initial  6 min walk test and THRR while considering  patient's activity barriers and safety.   Exercise Prescription Goal: Initial exercise prescription builds to 30-45 minutes a day of aerobic activity, 2-3 days per week.  Home exercise guidelines will be given to patient during program as part of exercise prescription that the participant will acknowledge.   Education: Aerobic Exercise: - Group verbal and visual presentation on the components of exercise prescription. Introduces F.I.T.T principle from ACSM for exercise prescriptions.  Reviews F.I.T.T. principles of aerobic exercise including progression. Written material given at graduation.   Education: Resistance Exercise: - Group verbal and visual presentation on the components of exercise prescription. Introduces F.I.T.T principle from ACSM for exercise prescriptions  Reviews F.I.T.T. principles of resistance exercise including progression. Written material given at graduation.    Education: Exercise & Equipment Safety: - Individual verbal instruction and demonstration of equipment use and safety with use of the equipment. Flowsheet Row Cardiac Rehab from 09/01/2022 in [facility] Cardiac and Pulmonary Rehab  Date 08/02/22  Educator [facility]  Instruction Review Code 1- Verbalizes Understanding       Education: Exercise Physiology & General Exercise Guidelines: - Group verbal and written instruction with models to review the exercise physiology of the cardiovascular system and associated critical values. Provides general exercise guidelines with specific guidelines to those with heart or lung disease.    Education: Flexibility, Balance, Mind/Body Relaxation: - Group verbal and visual presentation with interactive activity on the components of exercise prescription. Introduces F.I.T.T principle from ACSM for exercise prescriptions. Reviews F.I.T.T. principles of flexibility and balance exercise training including progression. Also discusses the mind body  connection.  Reviews various relaxation techniques to help reduce and manage stress (i.e. Deep breathing, progressive muscle relaxation, and visualization). Balance handout provided to take home. Written material given at graduation.   Activity Barriers & Risk Stratification:  Activity Barriers & Cardiac Risk Stratification - 08/09/22 1155       Activity Barriers & Cardiac Risk Stratification   Activity Barriers Deconditioning;Muscular Weakness;Balance Concerns    Cardiac Risk Stratification High             6 Minute Walk:  6 Minute Walk     Row Name 08/09/22 1154         6 Minute Walk   Phase Initial     Distance 1200 feet     Walk Time 6 minutes     # of Rest Breaks 0  MPH 2.27     METS 3.21     RPE 11     Perceived Dyspnea  1     VO2 Peak 11.24     Symptoms Yes (comment)     Comments SOB     Resting HR 74 bpm     Resting BP 136/74     Resting Oxygen Saturation  100 %     Exercise Oxygen Saturation  during 6 min walk 100 %     Max Ex. HR 96 bpm     Max Ex. BP 174/82     2 Minute Post BP 142/70              Oxygen Initial Assessment:   Oxygen Re-Evaluation:   Oxygen Discharge (Final Oxygen Re-Evaluation):   Initial Exercise Prescription:  Initial Exercise Prescription - 08/09/22 1100       Date of Initial Exercise RX and Referring Provider   Date 08/09/22    Referring Provider Pleas Patricia MD      Oxygen   Maintain Oxygen Saturation 88% or higher      Treadmill   MPH 2.2    Grade 1    Minutes 15    METs 2.99      Recumbant Elliptical   Level 1    RPM 50    Minutes 15    METs 3      REL-XR   Level 3    Speed 50    Minutes 15    METs 3      Prescription Details   Frequency (times per week) 3    Duration Progress to 30 minutes of continuous aerobic without signs/symptoms of physical distress      Intensity   THRR 40-80% of Max Heartrate 109-142    Ratings of Perceived Exertion 11-13    Perceived Dyspnea 0-4       Progression   Progression Continue to progress workloads to maintain intensity without signs/symptoms of physical distress.      Resistance Training   Training Prescription Yes    Weight 4 lb    Reps 10-15             Perform Capillary Blood Glucose checks as needed.  Exercise Prescription Changes:   Exercise Prescription Changes     Row Name 08/09/22 1100 08/17/22 1500 08/25/22 1000 08/30/22 1000 09/15/22 1600     Response to Exercise   Blood Pressure (Admit) 136/74 128/62 -- 118/64 122/60   Blood Pressure (Exercise) 174/82 142/64 -- -- 164/70   Blood Pressure (Exit) 142/70 122/62 -- 126/60 122/78   Heart Rate (Admit) 75 bpm 77 bpm -- 90 bpm 88 bpm   Heart Rate (Exercise) 96 bpm 104 bpm -- 122 bpm 124 bpm   Heart Rate (Exit) 78 bpm 75 bpm -- 105 bpm 90 bpm   Oxygen Saturation (Admit) 100 % -- -- -- --   Oxygen Saturation (Exercise) 100 % -- -- -- --   Rating of Perceived Exertion (Exercise) 11 12 -- 13 13   Perceived Dyspnea (Exercise) 1 -- -- -- --   Symptoms SOB none -- none none   Comments walk test results -- -- -- --   Duration -- Continue with 30 min of aerobic exercise without signs/symptoms of physical distress. -- Continue with 30 min of aerobic exercise without signs/symptoms of physical distress. Continue with 30 min of aerobic exercise without signs/symptoms of physical distress.   Intensity -- THRR unchanged -- THRR unchanged  THRR unchanged     Progression   Progression -- Continue to progress workloads to maintain intensity without signs/symptoms of physical distress. -- Continue to progress workloads to maintain intensity without signs/symptoms of physical distress. Continue to progress workloads to maintain intensity without signs/symptoms of physical distress.   Average METs -- 2.65 -- 2.88 2.86     Resistance Training   Training Prescription -- Yes -- Yes Yes   Weight -- 4 lb -- 4 lb 4 lb   Reps -- 10-15 -- 10-15 10-15     Interval Training    Interval Training -- No -- No No     Treadmill   MPH -- 2.2 -- 2.8 2.8   Grade -- 1 -- 1 1   Minutes -- 15 -- 15 15   METs -- 2.99 -- 3.53 3.53     Recumbant Elliptical   Level -- 2 -- 1.6 2   Minutes -- 15 -- 15 15   METs -- 2.1 -- 1.7 2     REL-XR   Level -- 4 -- 5 2   Minutes -- 15 -- 15 15   METs -- 2.5 -- 3 --     Home Exercise Plan   Plans to continue exercise at -- -- Home (comment)  walking, staff videos Home (comment)  walking, staff videos Home (comment)  walking, staff videos   Frequency -- -- Add 2 additional days to program exercise sessions. Add 2 additional days to program exercise sessions. Add 2 additional days to program exercise sessions.   Initial Home Exercises Provided -- -- 08/25/22 08/25/22 08/25/22     Oxygen   Maintain Oxygen Saturation -- 88% or higher -- 88% or higher 88% or higher    Row Name 09/28/22 1400 10/14/22 1500 10/26/22 1400 11/11/22 0900 11/25/22 1400     Response to Exercise   Blood Pressure (Admit) 128/62 118/60 130/66 118/70 126/64   Blood Pressure (Exercise) 148/58 -- -- -- --   Blood Pressure (Exit) 128/64 132/68 128/60 126/70 112/64   Heart Rate (Admit) 75 bpm 62 bpm 65 bpm 61 bpm 73 bpm   Heart Rate (Exercise) 109 bpm 98 bpm 89 bpm 89 bpm 86 bpm   Heart Rate (Exit) 84 bpm 69 bpm 66 bpm 69 bpm 64 bpm   Rating of Perceived Exertion (Exercise) 13 13 13 13 13    Symptoms none none none none none   Duration Continue with 30 min of aerobic exercise without signs/symptoms of physical distress. Continue with 30 min of aerobic exercise without signs/symptoms of physical distress. Continue with 30 min of aerobic exercise without signs/symptoms of physical distress. Continue with 30 min of aerobic exercise without signs/symptoms of physical distress. Continue with 30 min of aerobic exercise without signs/symptoms of physical distress.   Intensity THRR unchanged THRR unchanged THRR unchanged THRR unchanged THRR unchanged     Progression    Progression Continue to progress workloads to maintain intensity without signs/symptoms of physical distress. Continue to progress workloads to maintain intensity without signs/symptoms of physical distress. Continue to progress workloads to maintain intensity without signs/symptoms of physical distress. Continue to progress workloads to maintain intensity without signs/symptoms of physical distress. Continue to progress workloads to maintain intensity without signs/symptoms of physical distress.   Average METs 2.98 2.7 0.36 2.78 2.45     Resistance Training   Training Prescription Yes Yes Yes Yes Yes   Weight 5 lb 5 lb 5 lb 5 lb 5 lb   Reps  10-15 10-15 10-15 10-15 10-15     Interval Training   Interval Training -- No No No No     Treadmill   MPH 3 2.7 2.7 2.8 2.8   Grade 1.5 1 2 2 2    Minutes 15 15 15 15 15    METs 3.92 3.44 3.81 3.91 3.91     NuStep   Level -- -- 4 -- --   Minutes -- -- 15 -- --   METs -- -- 4 -- --     Recumbant Elliptical   Level 1 1.5 1 1  --   Minutes 15 15 15 15  --   METs 1.6 1.8 -- 1.8 --     REL-XR   Level 3 2 3 3  --   Minutes 15 15 15 15  --   METs 2.4 2.1 -- -- --     T5 Nustep   Level 1 -- -- -- --   Minutes 15 -- -- -- --   METs 1.8 -- -- -- --     Home Exercise Plan   Plans to continue exercise at Home (comment)  walking, staff videos Home (comment)  walking, staff videos Home (comment)  walking, staff videos Home (comment)  walking, staff videos Home (comment)  walking, staff videos   Frequency Add 2 additional days to program exercise sessions. Add 2 additional days to program exercise sessions. Add 2 additional days to program exercise sessions. Add 2 additional days to program exercise sessions. Add 2 additional days to program exercise sessions.   Initial Home Exercises Provided 08/25/22 08/25/22 08/25/22 08/25/22 08/25/22     Oxygen   Maintain Oxygen Saturation 88% or higher 88% or higher 88% or higher 88% or higher 88% or higher             Exercise Comments:   Exercise Comments     Row Name 08/11/22 1035           Exercise Comments First full day of exercise!  Patient was oriented to gym and equipment including functions, settings, policies, and procedures.  Patient's individual exercise prescription and treatment plan were reviewed.  All starting workloads were established based on the results of the 6 minute walk test done at initial orientation visit.  The plan for exercise progression was also introduced and progression will be customized based on patient's performance and goals.                Exercise Goals and Review:   Exercise Goals     Row Name 08/09/22 1200             Exercise Goals   Increase Physical Activity Yes       Intervention Provide advice, education, support and counseling about physical activity/exercise needs.;Develop an individualized exercise prescription for aerobic and resistive training based on initial evaluation findings, risk stratification, comorbidities and participant's personal goals.       Expected Outcomes Short Term: Attend rehab on a regular basis to increase amount of physical activity.;Long Term: Add in home exercise to make exercise part of routine and to increase amount of physical activity.;Long Term: Exercising regularly at least 3-5 days a week.       Increase Strength and Stamina Yes       Intervention Provide advice, education, support and counseling about physical activity/exercise needs.;Develop an individualized exercise prescription for aerobic and resistive training based on initial evaluation findings, risk stratification, comorbidities and participant's personal goals.       Expected  Outcomes Short Term: Increase workloads from initial exercise prescription for resistance, speed, and METs.;Short Term: Perform resistance training exercises routinely during rehab and add in resistance training at home;Long Term: Improve cardiorespiratory fitness, muscular  endurance and strength as measured by increased METs and functional capacity (6MWT)       Able to understand and use rate of perceived exertion (RPE) scale Yes       Intervention Provide education and explanation on how to use RPE scale       Expected Outcomes Short Term: Able to use RPE daily in rehab to express subjective intensity level;Long Term:  Able to use RPE to guide intensity level when exercising independently       Able to understand and use Dyspnea scale Yes       Intervention Provide education and explanation on how to use Dyspnea scale       Expected Outcomes Short Term: Able to use Dyspnea scale daily in rehab to express subjective sense of shortness of breath during exertion;Long Term: Able to use Dyspnea scale to guide intensity level when exercising independently       Knowledge and understanding of Target Heart Rate Range (THRR) Yes       Intervention Provide education and explanation of THRR including how the numbers were predicted and where they are located for reference       Expected Outcomes Short Term: Able to state/look up THRR;Short Term: Able to use daily as guideline for intensity in rehab;Long Term: Able to use THRR to govern intensity when exercising independently       Able to check pulse independently Yes       Intervention Provide education and demonstration on how to check pulse in carotid and radial arteries.;Review the importance of being able to check your own pulse for safety during independent exercise       Expected Outcomes Short Term: Able to explain why pulse checking is important during independent exercise;Long Term: Able to check pulse independently and accurately       Understanding of Exercise Prescription Yes       Intervention Provide education, explanation, and written materials on patient's individual exercise prescription       Expected Outcomes Short Term: Able to explain program exercise prescription;Long Term: Able to explain home exercise  prescription to exercise independently                Exercise Goals Re-Evaluation :  Exercise Goals Re-Evaluation     Row Name 08/11/22 1035 08/17/22 1508 08/25/22 1034 08/30/22 1052 09/15/22 1618     Exercise Goal Re-Evaluation   Exercise Goals Review Able to understand and use rate of perceived exertion (RPE) scale;Able to understand and use Dyspnea scale;Knowledge and understanding of Target Heart Rate Range (THRR);Understanding of Exercise Prescription Understanding of Exercise Prescription;Increase Physical Activity;Increase Strength and Stamina Understanding of Exercise Prescription;Increase Physical Activity;Increase Strength and Stamina;Able to understand and use rate of perceived exertion (RPE) scale;Able to understand and use Dyspnea scale;Able to check pulse independently;Knowledge and understanding of Target Heart Rate Range (THRR) Increase Physical Activity;Increase Strength and Stamina;Understanding of Exercise Prescription Increase Physical Activity;Increase Strength and Stamina;Understanding of Exercise Prescription   Comments Reviewed RPE and dyspnea scales, THR and program prescription with pt today.  Pt voiced understanding and was given a copy of goals to take home. Tyresha is off to a good start in rehab. She recently improved her overall average MET level to 2.65 METs. She also did well on the treadmill  at a speed of 2.2 mph and an incline of 1%. She tolerated 4 lb hand weights for resistance training as well. We will continue to monitor her progress in the program. Reviewed home exercise with pt today.  Pt plans to walk at home and at beach for exercise.  Reviewed THR, pulse, RPE, sign and symptoms, pulse oximetery and when to call 911 or MD.  Also discussed weather considerations and indoor options.  Pt voiced understanding. Kaliope is doing well for the first couple weeks she has been here for rehab. She has increased her treadmill speed to a 2.8 mph with a 1% incline.  She is  also already up to level 5 on the XR using 4 lb handweights. We will continue to monitor as she progresses in the program. Pollyanna continues to do well in rehab. Her MET level has stayed consistently above 2.8 METs. She also has continued to walk on the treadmill at a speed of 2.8 mph and an incline of 1%. We will continue to monitor her progress in the program.   Expected Outcomes Short: Use RPE daily to regulate intensity. Long: Follow program prescription in THR. Short: Continue to follow current exercise prescription. Long: Continue to increase strength and stamina. Short: Continue to walk on off days Long: Continue to improve stamina Short: Continue to increase worklod on treadmill Long: Continue to increase overall MET level Short: Continue to increase workloads as tolerated. Long: Continue to increase strength and stamina.    [place] Name 09/20/22 1355 09/28/22 1455 10/11/22 1343 10/14/22 1504 10/26/22 1440     Exercise Goal Re-Evaluation   Exercise Goals Review Increase Physical Activity;Increase Strength and Stamina;Understanding of Exercise Prescription Increase Physical Activity;Increase Strength and Stamina;Understanding of Exercise Prescription Increase Physical Activity;Increase Strength and Stamina;Understanding of Exercise Prescription Increase Physical Activity;Increase Strength and Stamina;Understanding of Exercise Prescription Increase Physical Activity;Increase Strength and Stamina;Understanding of Exercise Prescription   Comments Henlee is doing well with her exercise at home. She is walking outdoor for about 20-30 minutes. Encouraged to reach the minimum 30 minutes at once. She goes to the beach once per month and walks there as well. We reiterated the weather conditions now that it is getting colder out and to stay indoors when it gets colder out. She does check her HR with her watch and pulse ox but says it doesn't get up over 100 bpm. She doesn't normally hit her THR but is aware most  likely from her medications. Keli continues to do well in rehab.  She has increased to working 3 mph/1% incline on the treadmill! She is working at level 3 on the XR and could benefit from increasing her levels on both the T5 Nustep and REL. She is now using 5 lb handweights for resistance training as well. Will continue to monitor. Taronda is doing well in rehab. She is staying busy at home and staying active.  She is is getting in some walking, but not enough exercise.  She does note that her stamina is improving overall. Hania continues to do well in rehab. She has consistently worked at an average of 2.7 METs. She also has done well on the XR at level 2 and the REL at level 1.5. We will continue to monitor her progress in the program. We will continue to monitor her progress in the program. Reylynn continues to do well. She is not quite hitting her THR but her RPEs are staying in appropriate range. Her treadmill workloads vary each session and will  remind patient to progressively increase her levels. She did work up to 4 METs on the T4 Nustep which she has never done before. We will continue to monitor.   Expected Outcomes Short: Continue to hit 30 minutes with walking at home Long: Continue to exercise independently Short: Increase level on REL and T5 Long: Continue to increase overall MET level Short: Make time to exercise Long; Conintue to improve stamina Short: Stay consistent with workload on the treadmill. Long: Continue to increase strength and stamina. Short: Continue to increase level on T4 Nustep Long: Continue to increase overall MET level    Row Name 11/11/22 0945 11/24/22 1010 11/25/22 1437         Exercise Goal Re-Evaluation   Exercise Goals Review Increase Physical Activity;Increase Strength and Stamina;Understanding of Exercise Prescription Increase Physical Activity;Increase Strength and Stamina;Understanding of Exercise Prescription Increase Physical Activity;Increase Strength and  Stamina;Understanding of Exercise Prescription     Comments Josiephine has been doing well in rehab. She was able to improve her speed on the treadmill back up to 2.8 mph while maintaining a 2% incline. She also has stayed consistent with her workloads on both the XR at level 3 and the recumbent elliptical at level 1. We will continue to monitor her progress. Fiana returned to rehab today.  She will be gone again the rest of the month for MD appt and going back to beach.  She has been out due to [place].  She hopes that now that she is feeling better she can get back to exercise routinely again.  She is planning to finish up rehab in February as she does not currently have anything scheduled at this time. Krystalyn returned to rehab after being out due to covid. She worked at her exercise prescription that she did prior to getting sick. She eased back into everything with appropriate RPEs. We will continue to monitor once she is back into the routine.     Expected Outcomes Short: Continue to increase workload on REL. Long: Continue to improve strength and stamina. Short; Return to regular exercise and attend rehab regularly Long: Continue to improve stamina again Short: Continue rehab with good attendance Long: Continue to improve overall MET level              Discharge Exercise Prescription (Final Exercise Prescription Changes):  Exercise Prescription Changes - 11/25/22 1400       Response to Exercise   Blood Pressure (Admit) 126/64    Blood Pressure (Exit) 112/64    Heart Rate (Admit) 73 bpm    Heart Rate (Exercise) 86 bpm    Heart Rate (Exit) 64 bpm    Rating of Perceived Exertion (Exercise) 13    Symptoms none    Duration Continue with 30 min of aerobic exercise without signs/symptoms of physical distress.    Intensity THRR unchanged      Progression   Progression Continue to progress workloads to maintain intensity without signs/symptoms of physical distress.    Average METs 2.45       Resistance Training   Training Prescription Yes    Weight 5 lb    Reps 10-15      Interval Training   Interval Training No      Treadmill   MPH 2.8    Grade 2    Minutes 15    METs 3.91      Home Exercise Plan   Plans to continue exercise at Home (comment)   walking, staff videos  Frequency Add 2 additional days to program exercise sessions.    Initial Home Exercises Provided 08/25/22      Oxygen   Maintain Oxygen Saturation 88% or higher             Nutrition:  Target Goals: Understanding of nutrition guidelines, daily intake of sodium 1500mg , cholesterol 200mg , calories 30% from fat and 7% or less from saturated fats, daily to have 5 or more servings of fruits and vegetables.  Education: All About Nutrition: -Group instruction provided by verbal, written material, interactive activities, discussions, models, and posters to present general guidelines for heart healthy nutrition including fat, fiber, MyPlate, the role of sodium in heart healthy nutrition, utilization of the nutrition label, and utilization of this knowledge for meal planning. Follow up email sent as well. Written material given at graduation. Flowsheet Row Cardiac Rehab from 09/01/2022 in [facility] Cardiac and Pulmonary Rehab  Education need identified 08/09/22       Biometrics:  Pre Biometrics - 08/09/22 1200       Pre Biometrics   Height 5' 5.5" (1.664 m)    Weight 194 lb 12.8 oz (88.4 kg)    Waist Circumference 38.5 inches    Hip Circumference 43.5 inches    Waist to Hip Ratio 0.89 %    BMI (Calculated) 31.91    Single Leg Stand 2.7 seconds              Nutrition Therapy Plan and Nutrition Goals:  Nutrition Therapy & Goals - 08/09/22 1123       Nutrition Therapy   Diet Heart healthy, low Na    Drug/Food Interactions Statins/Certain Fruits    Protein (specify units) 70-75g    Fiber 25 grams    Whole Grain Foods 3 servings    Saturated Fats 14 max. grams    Fruits and Vegetables 8  servings/day      Personal Nutrition Goals   Nutrition Goal ST: LT:    Comments 62 y.o. F admitted to cardiac rehab s/p CABG x3. PMHx includes CAD, hypercholesterolemia, HTN, paroxysmal a.fib. Relevant medications includes fluticasone, omeprazole, crestor. She has cut back on her portion sizes. L: leftovers (ex: chicken salad and some pasta salad) D: example steak tonight with potato and salad. Drinks: gatorade zero, sparkling, water. Cloa reports using olive oil and avocado oils with cooking, however, she will like to use butter, milk, and cheese with cooking - discussed some options to lower amount of high fat dairy with cooking and encouraged to choose recipes with lower amounts of dairy at least 2-3 times per week. She reports that her lab values for sodium have been low and her MD recommended to include sodium in her diet. She checks her BP regularly and it has been normal to high; sugested limiting Na to 2300mg  at the higher end to balance lower sodium numbers and elevated BP - informed her that we may need to make adjustments to this recommendation depeing on her lab and BP numbers moving foward. She reports liking pasta - encouaged her that she can still have pasta, but add lean protein and non-starchy vegetables so it is more balances and CHO do not take up most of the plate. Since she has decreased her portion sizes as well as starchy vegetables, discussed how heart healthy changes like increased non-starchy vegetables as a portion of the plate may cause her to be hungrier soon after eating and that healthy snacking may help to control hunger. Discussed heart healthy eating and  MyPlate. Aleyza feels that she has the tools she needs to achieve her goals with no major barriers reported.      Intervention Plan   Intervention Prescribe, educate and counsel regarding individualized specific dietary modifications aiming towards targeted core components such as weight, hypertension, lipid management,  diabetes, heart failure and other comorbidities.;Nutrition handout(s) given to patient.    Expected Outcomes Short Term Goal: Understand basic principles of dietary content, such as calories, fat, sodium, cholesterol and nutrients.;Short Term Goal: A plan has been developed with personal nutrition goals set during dietitian appointment.;Long Term Goal: Adherence to prescribed nutrition plan.             Nutrition Assessments:  MEDIFICTS Score Key: ?70 Need to make dietary changes  40-70 Heart Healthy Diet ? 40 Therapeutic Level Cholesterol Diet  Flowsheet Row Cardiac Rehab from 08/09/2022 in [facility] Cardiac and Pulmonary Rehab  Picture Your Plate Total Score on Admission 60      Picture Your Plate Scores: <30 Unhealthy dietary pattern with much room for improvement. 41-50 Dietary pattern unlikely to meet recommendations for good health and room for improvement. 51-60 More healthful dietary pattern, with some room for improvement.  >60 Healthy dietary pattern, although there may be some specific behaviors that could be improved.    Nutrition Goals Re-Evaluation:  Nutrition Goals Re-Evaluation     [place] Name 08/25/22 1026 09/20/22 1358 10/11/22 1347 11/24/22 1014       Goals   Nutrition Goal Short Term Goal: Understand basic principles of dietary content, such as calories, fat, sodium, cholesterol and nutrients.; Short Term Goal: A plan has been developed with personal nutrition goals set during dietitian appointment.; Long Term Goal: Adherence to prescribed nutrition plan. Short Term Goal: Understand basic principles of dietary content, such as calories, fat, sodium, cholesterol and nutrients.; Short Term Goal: A plan has been developed with personal nutrition goals set during dietitian appointment.; Long Term Goal: Adherence to prescribed nutrition plan. Short: Talk to doctor about amount of sodium intake Long: Continue to eat heart healthy and follow RD recommendations Short: Continue  watch sodium with doctor Long: Continue to eat heart healthy    Comment Jeremie is doing well with her diet.  She has gotten better about not snacking after dinner. She is being more diligent about watching her sodium and reading food labels. She is still doing well with her portion control as she and her husband share meals to help. She is still getting more variety and trying to add in more vegetables to meals too. She has started to use leaner proteins as well Leticia states she has not made any huge changes, however, she is avoiding fried foods and higher saturated foods. Her doctor told her from her recent bloodwork that her sodium is low and to eat more in her diet. Encouraged her to ask how much she should have to avoid an excess amount while managing her heart failure at the same time. She has her physical next week to review her bloodwork Nattie is doing well in rehab.  She is working on her diet.  She had a hot dog today but planning on a salad next week.  Her sodium is still running low.  She is drinking 1/2 gatorade. We talked about mixing her gatorade with water to help add more too.  She is aiming to get in vareity in her diet. Thersa is doing well in rehab.  She has gotten back on track for her diet.  She is  watching her sodium levels still and drinking lots of water.  She feels that she has a good handle on her diet.    Expected Outcome Short: Continue to add in vegetables Long: Conitnue to focus on heart healhty eating. Short: Talk to doctor about amount of sodium intake Long: Continue to eat heart healthy and follow RD recommendations Short: Continue watch sodium with doctor Long: Continue to eat heart healthy Short: Continue to add variety back in Long: Continue to focus on heart healthy eating             Nutrition Goals Discharge (Final Nutrition Goals Re-Evaluation):  Nutrition Goals Re-Evaluation - 11/24/22 1014       Goals   Nutrition Goal Short: Continue watch sodium with doctor  Long: Continue to eat heart healthy    Comment Kely is doing well in rehab.  She has gotten back on track for her diet.  She is watching her sodium levels still and drinking lots of water.  She feels that she has a good handle on her diet.    Expected Outcome Short: Continue to add variety back in Long: Continue to focus on heart healthy eating             Psychosocial: Target Goals: Acknowledge presence or absence of significant depression and/or stress, maximize coping skills, provide positive support system. Participant is able to verbalize types and ability to use techniques and skills needed for reducing stress and depression.   Education: Stress, Anxiety, and Depression - Group verbal and visual presentation to define topics covered.  Reviews how body is impacted by stress, anxiety, and depression.  Also discusses healthy ways to reduce stress and to treat/manage anxiety and depression.  Written material given at graduation. Flowsheet Row Cardiac Rehab from 09/01/2022 in [facility] Cardiac and Pulmonary Rehab  Education need identified 08/09/22       Education: Sleep Hygiene -Provides group verbal and written instruction about how sleep can affect your health.  Define sleep hygiene, discuss sleep cycles and impact of sleep habits. Review good sleep hygiene tips.    Initial Review & Psychosocial Screening:  Initial Psych Review & Screening - 08/02/22 1125       Initial Review   Current issues with None Identified      Family Dynamics   Good Support System? Yes    Comments Hilda can look to her spouse family and freinds for support. She feels good about her health and is ready to make changes.      Barriers   Psychosocial barriers to participate in program The patient should benefit from training in stress management and relaxation.;There are no identifiable barriers or psychosocial needs.      Screening Interventions   Interventions Encouraged to exercise;To provide support  and resources with identified psychosocial needs;Provide feedback about the scores to participant    Expected Outcomes Short Term goal: Utilizing psychosocial counselor, staff and physician to assist with identification of specific Stressors or current issues interfering with healing process. Setting desired goal for each stressor or current issue identified.;Long Term Goal: Stressors or current issues are controlled or eliminated.;Short Term goal: Identification and review with participant of any Quality of Life or Depression concerns found by scoring the questionnaire.;Long Term goal: The participant improves quality of Life and PHQ9 Scores as seen by post scores and/or verbalization of changes             Quality of Life Scores:   Quality of Life - 08/09/22 1200  Quality of Life   Select Quality of Life      Quality of Life Scores   Health/Function Pre 26 %    Socioeconomic Pre 23.75 %    Psych/Spiritual Pre 30 %    Family Pre 28.8 %    GLOBAL Pre 26.69 %            Scores of 19 and below usually indicate a poorer quality of life in these areas.  A difference of  2-3 points is a clinically meaningful difference.  A difference of 2-3 points in the total score of the Quality of Life Index has been associated with significant improvement in overall quality of life, self-image, physical symptoms, and general health in studies assessing change in quality of life.  PHQ-9: Review Flowsheet  More data exists      08/09/2022 06/26/2021 02/21/2020 12/05/2018 09/26/2018  Depression screen PHQ 2/9  Decreased Interest 0 0 0 0 0  Down, Depressed, Hopeless 0 0 0 0 0  PHQ - 2 Score 0 0 0 0 0  Altered sleeping 0 - - - -  Tired, decreased energy 0 - - - -  Change in appetite 0 - - - -  Feeling bad or failure about yourself  0 - - - -  Trouble concentrating 0 - - - -  Moving slowly or fidgety/restless 0 - - - -  Suicidal thoughts 0 - - - -  PHQ-9 Score 0 - - - -  Difficult doing  work/chores Not difficult at all - - - -   Interpretation of Total Score  Total Score Depression Severity:  1-4 = Minimal depression, 5-9 = Mild depression, 10-14 = Moderate depression, 15-19 = Moderately severe depression, 20-27 = Severe depression   Psychosocial Evaluation and Intervention:  Psychosocial Evaluation - 08/02/22 1127       Psychosocial Evaluation & Interventions   Interventions Encouraged to exercise with the program and follow exercise prescription;Relaxation education;Stress management education    Comments Jarissa can look to her spouse family and freinds for support. She feels good about her health and is ready to make changes.    Expected Outcomes Short: Start HeartTrack to help with mood. Long: Maintain a healthy mental state    Continue Psychosocial Services  Follow up required by staff             Psychosocial Re-Evaluation:  Psychosocial Re-Evaluation     [place] Name 08/25/22 1024 09/20/22 1406 10/11/22 1345 11/24/22 1012       Psychosocial Re-Evaluation   Current issues with Current Stress Concerns Current Stress Concerns Current Stress Concerns;None Identified Current Stress Concerns;None Identified    Comments Gwendolyn is doing well in rehab. She has started back to work.  She is working from home so that is helping. She did need to switch her Mondays to an afternoon appt due to a work conference call.  She is sleeping until 330-4am and then wakes, unable to get back to sleep.  Her legs from surgery are painful and she is also waking with coughing and they are going to try to change something else will help. She seeing PA next week for that. Jaedyn is doing well mentally. 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 She may have a retirement party.  She cuts her phone off to dissociate away from work. She goes to the beach once per month which really helps her  get her mind off things. Her birthday  was a couple days ago and went to [facility] to celebrate! Her cough is completely gone as her doctor pinpointed which exact medication was causing that side effect. She feels much better. She did not sleep well last night but not normally it is not a problem. She'll continue to watch it and speak up if it becomes a problem. Harriett is doing well in rehab. She does not claim to have any stressors at this time.  She is counting down to retirement.  14 days left!  She is just letting things go at work and not worring about it.  She went to beach for Thanksgiving and enjoyed it. She plans to go back next week again.  She continues to sleep well. Kiaira returned to rehab today. She will be gone again the rest of the month for MD appt and going back to beach. She has been out due to [place].  She is starting to feel better again.  She hopes to get back to doing what she likes now that she can leave the house again.  She is sleeping well normally, but now the predinisone is keeping her awake some at night.  She is enjoying retirement.    Expected Outcomes Short; Figure which med is causing her coughing Long: Continue to cope with return to work. Short: Retire in December! Long: Continue to maintain positive attitude Short: Countdown to retirement Long: conitnue to stay positive Short: Enjoy beach trip Long: Continue to exercise for mental boost    Interventions Stress management education;Encouraged to attend Cardiac Rehabilitation for the exercise Encouraged to attend Cardiac Rehabilitation for the exercise Encouraged to attend Cardiac Rehabilitation for the exercise Encouraged to attend Cardiac Rehabilitation for the exercise    Continue Psychosocial Services  Follow up required by staff Follow up required by staff Follow up required by staff Follow up required by staff             Psychosocial Discharge (Final Psychosocial Re-Evaluation):  Psychosocial Re-Evaluation - 11/24/22 1012       Psychosocial  Re-Evaluation   Current issues with Current Stress Concerns;None Identified    Comments Skyley returned to rehab today. She will be gone again the rest of the month for MD appt and going back to beach. She has been out due to [place].  She is starting to feel better again.  She hopes to get back to doing what she likes now that she can leave the house again.  She is sleeping well normally, but now the predinisone is keeping her awake some at night.  She is enjoying retirement.    Expected Outcomes Short: Enjoy beach trip Long: Continue to exercise for mental boost    Interventions Encouraged to attend Cardiac Rehabilitation for the exercise    Continue Psychosocial Services  Follow up required by staff             Vocational Rehabilitation: Provide vocational rehab assistance to qualifying candidates.   Vocational Rehab Evaluation & Intervention:   Education: Education Goals: Education classes will be provided on a variety of topics geared toward better understanding of heart health and risk factor modification. Participant will state understanding/return demonstration of topics presented as noted by education test scores.  Learning Barriers/Preferences:  Learning Barriers/Preferences - 08/02/22 1124       Learning Barriers/Preferences   Learning Barriers None    Learning Preferences None  General Cardiac Education Topics:  AED/CPR: - Group verbal and written instruction with the use of models to demonstrate the basic use of the AED with the basic ABC's of resuscitation.   Anatomy and Cardiac Procedures: - Group verbal and visual presentation and models provide information about basic cardiac anatomy and function. Reviews the testing methods done to diagnose heart disease and the outcomes of the test results. Describes the treatment choices: Medical Management, Angioplasty, or Coronary Bypass Surgery for treating various heart conditions including Myocardial  Infarction, Angina, Valve Disease, and Cardiac Arrhythmias.  Written material given at graduation.   Medication Safety: - Group verbal and visual instruction to review commonly prescribed medications for heart and lung disease. Reviews the medication, class of the drug, and side effects. Includes the steps to properly store meds and maintain the prescription regimen.  Written material given at graduation.   Intimacy: - Group verbal instruction through game format to discuss how heart and lung disease can affect sexual intimacy. Written material given at graduation.. Flowsheet Row Cardiac Rehab from 09/01/2022 in [facility] Cardiac and Pulmonary Rehab  Education need identified 08/09/22       Know Your Numbers and Heart Failure: - Group verbal and visual instruction to discuss disease risk factors for cardiac and pulmonary disease and treatment options.  Reviews associated critical values for Overweight/Obesity, Hypertension, Cholesterol, and Diabetes.  Discusses basics of heart failure: signs/symptoms and treatments.  Introduces Heart Failure Zone chart for action plan for heart failure.  Written material given at graduation.   Infection Prevention: - Provides verbal and written material to individual with discussion of infection control including proper hand washing and proper equipment cleaning during exercise session. Flowsheet Row Cardiac Rehab from 09/01/2022 in [facility] Cardiac and Pulmonary Rehab  Date 08/02/22  Educator [facility]  Instruction Review Code 1- Verbalizes Understanding       Falls Prevention: - Provides verbal and written material to individual with discussion of falls prevention and safety. Flowsheet Row Cardiac Rehab from 09/01/2022 in [facility] Cardiac and Pulmonary Rehab  Date 08/02/22  Educator [facility]  Instruction Review Code 1- Verbalizes Understanding       Other: -Provides group and verbal instruction on various topics (see comments)   Knowledge Questionnaire Score:   Knowledge Questionnaire Score - 08/09/22 1201       Knowledge Questionnaire Score   Pre Score 23/26             Core Components/Risk Factors/Patient Goals at Admission:  Personal Goals and Risk Factors at Admission - 08/09/22 1202       Core Components/Risk Factors/Patient Goals on Admission    Weight Management Yes;Weight Loss;Obesity    Intervention Weight Management: Develop a combined nutrition and exercise program designed to reach desired caloric intake, while maintaining appropriate intake of nutrient and fiber, sodium and fats, and appropriate energy expenditure required for the weight goal.;Weight Management/Obesity: Establish reasonable short term and long term weight goals.;Weight Management: Provide education and appropriate resources to help participant work on and attain dietary goals.;Obesity: Provide education and appropriate resources to help participant work on and attain dietary goals.    Admit Weight 194 lb 12.8 oz (88.4 kg)    Goal Weight: Short Term 190 lb (86.2 kg)    Goal Weight: Long Term 185 lb (83.9 kg)    Expected Outcomes Short Term: Continue to assess and modify interventions until short term weight is achieved;Long Term: Adherence to nutrition and physical activity/exercise program aimed toward attainment of established weight goal;Weight Loss:  Understanding of general recommendations for a balanced deficit meal plan, which promotes 1-2 lb weight loss per week and includes a negative energy balance of [phone] kcal/d;Understanding recommendations for meals to include 15-35% energy as protein, 25-35% energy from fat, 35-60% energy from carbohydrates, less than 200mg  of dietary cholesterol, 20-35 gm of total fiber daily;Understanding of distribution of calorie intake throughout the day with the consumption of 4-5 meals/snacks    Hypertension Yes    Intervention Provide education on lifestyle modifcations including regular physical activity/exercise, weight  management, moderate sodium restriction and increased consumption of fresh fruit, vegetables, and low fat dairy, alcohol moderation, and smoking cessation.;Monitor prescription use compliance.    Expected Outcomes Short Term: Continued assessment and intervention until BP is < 140/75mm HG in hypertensive participants. < 130/70mm HG in hypertensive participants with diabetes, heart failure or chronic kidney disease.;Long Term: Maintenance of blood pressure at goal levels.    Lipids Yes    Intervention Provide education and support for participant on nutrition & aerobic/resistive exercise along with prescribed medications to achieve LDL 70mg , HDL >40mg .    Expected Outcomes Short Term: Participant states understanding of desired cholesterol values and is compliant with medications prescribed. Participant is following exercise prescription and nutrition guidelines.;Long Term: Cholesterol controlled with medications as prescribed, with individualized exercise RX and with personalized nutrition plan. Value goals: LDL < 70mg , HDL > 40 mg.             Education:Diabetes - Individual verbal and written instruction to review signs/symptoms of diabetes, desired ranges of glucose level fasting, after meals and with exercise. Acknowledge that pre and post exercise glucose checks will be done for 3 sessions at entry of program.   Core Components/Risk Factors/Patient Goals Review:   Goals and Risk Factor Review     Row Name 08/25/22 1029 09/20/22 1400 10/11/22 1350 11/24/22 1015       Core Components/Risk Factors/Patient Goals Review   Personal Goals Review Weight Management/Obesity;Heart Failure;Hypertension;Lipids Weight Management/Obesity;Heart Failure;Hypertension;Lipids Weight Management/Obesity;Heart Failure;Hypertension;Lipids Weight Management/Obesity;Heart Failure;Hypertension;Lipids    Review Abby is doing well in rehab. Her weight will do well as long as she is not eating at the beach.  Anytime she goes to beach, she notices her weight goes up.  She has not had any heart failure symtptoms.  Her pressures are doing well overall, but as they are working on her medications due to a cough she has noticed that it will go up and down.  She sees the PA again next week for next adjustment. Mechell continues to do well. She states she has lost a few pounds but wants to lose more, no weight goal. She does weigh herself daily and is aware to be mindful of any sudden weight gain that could possibly be fluid. She denies heart failure symptoms. She is trying to avoid high saturated dats and fried foods to help with her weight loss. Her cough is gone as her doctor switched her medications around and found out which one was causing the cough. She has a physical next week to review her bloodwork as her doctor states she is anemic and has low sodium. They will discuss possible supplements/next stemps. BP at home is good- she has a log on her phone. They range 295-188C systolic and 16-60Y diastolic. Her doctor swtiched her BP meds 2 weeks ago and is dilligently watching her pressures Brandilyn is doing well in rehab. Her weight is holding steady and she would like to lose.  Her pressures are still  a little low for her as she is dizzy in the morning.  She has not had any heart failure symptoms otherwise.  She is feeling better overall and looking forward to retirement in a few days. Gizzelle is doing well in rehab.  Her weight crept up some while she was out sick as she was not able to exercies.  She has not had heart failure symptoms.  Her pressures are doing well.  She was out sick with COVID and now feeling better.    Expected Outcomes Short: Continue to work on weight loss Long: Continue to improve stamina. Short: Talk to doctor about blood work (sodium and iron levels) Long: Continue to manage lifestyle risk factors Short: Continue to work on weight loss long: Conitnue to montior risk factors Short: Continue to work  on weight loss Long: Continue to monitor risk factors             Core Components/Risk Factors/Patient Goals at Discharge (Final Review):   Goals and Risk Factor Review - 11/24/22 1015       Core Components/Risk Factors/Patient Goals Review   Personal Goals Review Weight Management/Obesity;Heart Failure;Hypertension;Lipids    Review Katyana is doing well in rehab.  Her weight crept up some while she was out sick as she was not able to exercies.  She has not had heart failure symptoms.  Her pressures are doing well.  She was out sick with COVID and now feeling better.    Expected Outcomes Short: Continue to work on weight loss Long: Continue to monitor risk factors             ITP Comments:  ITP Comments     Row Name 08/02/22 1123 08/09/22 1154 08/11/22 0808 08/11/22 1035 09/08/22 1038   ITP Comments Virtual Visit completed. Patient informed on EP and RD appointment and 6 Minute walk test. Patient also informed of patient health questionnaires on My Chart. Patient Verbalizes understanding. Visit diagnosis can be found in [facility] 06/07/2022. Completed 6MWT and gym orientation. Initial ITP created and sent for review to Dr. Emily Filbert, Medical Director. 30 Day review completed. Medical Director ITP review done, changes made as directed, and signed approval by Medical Director.   New to program First full day of exercise!  Patient was oriented to gym and equipment including functions, settings, policies, and procedures.  Patient's individual exercise prescription and treatment plan were reviewed.  All starting workloads were established based on the results of the 6 minute walk test done at initial orientation visit.  The plan for exercise progression was also introduced and progression will be customized based on patient's performance and goals. 30 Day review completed. Medical Director ITP review done, changes made as directed, and signed approval by Medical Director.    [place] Name 10/06/22 1101  11/03/22 1128 11/09/22 1459 11/24/22 1011 12/01/22 1101   ITP Comments 30 Day review completed. Medical Director ITP review done, changes made as directed, and signed approval by Medical Director. 30 Day review completed. Medical Director ITP review done, changes made as directed, and signed approval by Medical Director. Patient called to let us know she is out with covid and will not return until 1/17. [place] returned to rehab today. She will be gone again the rest of the month for MD appt and going back to beach. She has been out due to [place]. 30 Day review completed. Medical Director ITP review done, changes made as directed, and signed approval by Medical Director.  Comments:

## 2022-12-06 ENCOUNTER — Encounter: Payer: BC Managed Care – PPO | Admitting: *Deleted

## 2022-12-07 ENCOUNTER — Encounter: Payer: Self-pay | Admitting: Podiatry

## 2022-12-07 ENCOUNTER — Ambulatory Visit (INDEPENDENT_AMBULATORY_CARE_PROVIDER_SITE_OTHER): Payer: BC Managed Care – PPO | Admitting: Podiatry

## 2022-12-07 DIAGNOSIS — M7661 Achilles tendinitis, right leg: Secondary | ICD-10-CM

## 2022-12-07 NOTE — Progress Notes (Signed)
Orthotics were dispensed and they are functioning well.

## 2022-12-08 ENCOUNTER — Encounter: Payer: BC Managed Care – PPO | Admitting: *Deleted

## 2022-12-08 DIAGNOSIS — Z951 Presence of aortocoronary bypass graft: Secondary | ICD-10-CM | POA: Diagnosis not present

## 2022-12-08 NOTE — Progress Notes (Signed)
Daily Session Note  Patient Details  Name: LESHAWN HOUSEWORTH MRN: [id number] Date of Birth: 12/31/1960 Referring Provider:   Flowsheet Row Cardiac Rehab from 08/09/2022 in [facility] Cardiac and Pulmonary Rehab  Referring Provider Pleas Patricia MD       Encounter Date: 12/08/2022  Check In:  Session Check In - 12/08/22 0952       Check-In   Supervising physician immediately available to respond to emergencies See telemetry face sheet for immediately available ER MD    Location [facility]-Cardiac & Pulmonary Rehab    Staff Present Darlyne Russian, RN, ADN;Meredith Sherryll Burger, RN BSN;Noah Tickle, BS, Exercise Physiologist;Susanne Bice, RN, BSN, CCRP    Virtual Visit No    Medication changes reported     No    Fall or balance concerns reported    No    Warm-up and Cool-down Performed on first and last piece of equipment    Resistance Training Performed Yes    VAD Patient? No    PAD/SET Patient? No      Pain Assessment   Currently in Pain? No/denies                Social History   Tobacco Use  Smoking Status Never  Smokeless Tobacco Never    Goals Met:  Independence with exercise equipment Exercise tolerated well No report of concerns or symptoms today Strength training completed today  Goals Unmet:  Not Applicable  Comments: Pt able to follow exercise prescription today without complaint.  Will continue to monitor for progression.    Dr. Emily Filbert is Medical Director for [place].  Dr. Ottie Glazier is Medical Director for [facility] Pulmonary Rehabilitation.

## 2022-12-09 DIAGNOSIS — I480 Paroxysmal atrial fibrillation: Secondary | ICD-10-CM | POA: Diagnosis not present

## 2022-12-09 DIAGNOSIS — I10 Essential (primary) hypertension: Secondary | ICD-10-CM | POA: Diagnosis not present

## 2022-12-09 DIAGNOSIS — E7800 Pure hypercholesterolemia, unspecified: Secondary | ICD-10-CM | POA: Diagnosis not present

## 2022-12-09 DIAGNOSIS — I2510 Atherosclerotic heart disease of native coronary artery without angina pectoris: Secondary | ICD-10-CM | POA: Diagnosis not present

## 2022-12-10 ENCOUNTER — Encounter: Payer: BC Managed Care – PPO | Attending: Cardiovascular Disease | Admitting: *Deleted

## 2022-12-10 DIAGNOSIS — Z48812 Encounter for surgical aftercare following surgery on the circulatory system: Secondary | ICD-10-CM | POA: Insufficient documentation

## 2022-12-10 DIAGNOSIS — Z951 Presence of aortocoronary bypass graft: Secondary | ICD-10-CM | POA: Diagnosis not present

## 2022-12-10 NOTE — Progress Notes (Signed)
Daily Session Note  Patient Details  Name: Amanda Estrada MRN: [id number] Date of Birth: 1961-01-16 Referring Provider:   Flowsheet Row Cardiac Rehab from 08/09/2022 in [facility] Cardiac and Pulmonary Rehab  Referring Provider Pleas Patricia MD       Encounter Date: 12/10/2022  Check In:  Session Check In - 12/10/22 1033       Check-In   Supervising physician immediately available to respond to emergencies See telemetry face sheet for immediately available ER MD    Location [facility]-Cardiac & Pulmonary Rehab    Staff Present Heath Lark, RN, BSN, CCRP;Jessica [place], MA, RCEP, CCRP, CCET;Joseph [place], [place]    Virtual Visit No    Medication changes reported     No    Fall or balance concerns reported    No    Warm-up and Cool-down Performed on first and last piece of equipment    Resistance Training Performed Yes    VAD Patient? No    PAD/SET Patient? No      Pain Assessment   Currently in Pain? No/denies                Social History   Tobacco Use  Smoking Status Never  Smokeless Tobacco Never    Goals Met:  Independence with exercise equipment Exercise tolerated well No report of concerns or symptoms today  Goals Unmet:  Not Applicable  Comments: Pt able to follow exercise prescription today without complaint.  Will continue to monitor for progression.    Dr. Emily Filbert is Medical Director for [place].  Dr. Ottie Glazier is Medical Director for [facility] Pulmonary Rehabilitation.

## 2022-12-13 ENCOUNTER — Encounter: Payer: BC Managed Care – PPO | Admitting: *Deleted

## 2022-12-13 DIAGNOSIS — Z951 Presence of aortocoronary bypass graft: Secondary | ICD-10-CM | POA: Diagnosis not present

## 2022-12-13 DIAGNOSIS — Z48812 Encounter for surgical aftercare following surgery on the circulatory system: Secondary | ICD-10-CM | POA: Diagnosis not present

## 2022-12-13 NOTE — Progress Notes (Signed)
Daily Session Note  Patient Details  Name: Amanda Estrada MRN: [id number] Date of Birth: 06-Dec-1960 Referring Provider:   Flowsheet Row Cardiac Rehab from 08/09/2022 in [facility] Cardiac and Pulmonary Rehab  Referring Provider Pleas Patricia MD       Encounter Date: 12/13/2022  Check In:  Session Check In - 12/13/22 1006       Check-In   Supervising physician immediately available to respond to emergencies See telemetry face sheet for immediately available ER MD    Location [facility]-Cardiac & Pulmonary Rehab    Staff Present Darlyne Russian, RN, Doyce Para, BS, ACSM CEP, Exercise Physiologist;Noah Tickle, BS, Exercise Physiologist    Virtual Visit No    Medication changes reported     No    Fall or balance concerns reported    No    Warm-up and Cool-down Performed on first and last piece of equipment    Resistance Training Performed Yes    VAD Patient? No    PAD/SET Patient? No      Pain Assessment   Currently in Pain? No/denies                Social History   Tobacco Use  Smoking Status Never  Smokeless Tobacco Never    Goals Met:  Independence with exercise equipment Exercise tolerated well No report of concerns or symptoms today Strength training completed today  Goals Unmet:  Not Applicable  Comments: Pt able to follow exercise prescription today without complaint.  Will continue to monitor for progression.    Dr. Emily Filbert is Medical Director for [place].  Dr. Ottie Glazier is Medical Director for [facility] Pulmonary Rehabilitation.

## 2022-12-14 ENCOUNTER — Encounter: Payer: Self-pay | Admitting: Family Medicine

## 2022-12-14 DIAGNOSIS — R928 Other abnormal and inconclusive findings on diagnostic imaging of breast: Secondary | ICD-10-CM

## 2022-12-15 ENCOUNTER — Encounter: Payer: BC Managed Care – PPO | Admitting: *Deleted

## 2022-12-15 DIAGNOSIS — Z951 Presence of aortocoronary bypass graft: Secondary | ICD-10-CM

## 2022-12-15 DIAGNOSIS — Z48812 Encounter for surgical aftercare following surgery on the circulatory system: Secondary | ICD-10-CM | POA: Diagnosis not present

## 2022-12-15 NOTE — Progress Notes (Signed)
Daily Session Note  Patient Details  Name: KATRESE SHELL MRN: [id number] Date of Birth: 1961-01-30 Referring Provider:   Flowsheet Row Cardiac Rehab from 08/09/2022 in [facility] Cardiac and Pulmonary Rehab  Referring Provider Pleas Patricia MD       Encounter Date: 12/15/2022  Check In:  Session Check In - 12/15/22 1001       Check-In   Supervising physician immediately available to respond to emergencies See telemetry face sheet for immediately available ER MD    Location [facility]-Cardiac & Pulmonary Rehab    Staff Present Darlyne Russian, RN, Lorin Mercy, MS, ACSM CEP, Exercise Physiologist;Noah Tickle, BS, Exercise Physiologist    Virtual Visit No    Medication changes reported     No    Fall or balance concerns reported    No    Warm-up and Cool-down Performed on first and last piece of equipment    Resistance Training Performed Yes    VAD Patient? No    PAD/SET Patient? No      Pain Assessment   Currently in Pain? No/denies                Social History   Tobacco Use  Smoking Status Never  Smokeless Tobacco Never    Goals Met:  Independence with exercise equipment Exercise tolerated well No report of concerns or symptoms today Strength training completed today  Goals Unmet:  Not Applicable  Comments: Pt able to follow exercise prescription today without complaint.  Will continue to monitor for progression.    Dr. Emily Filbert is Medical Director for [place].  Dr. Ottie Glazier is Medical Director for [facility] Pulmonary Rehabilitation.

## 2022-12-17 ENCOUNTER — Telehealth: Payer: BC Managed Care – PPO

## 2022-12-17 ENCOUNTER — Encounter: Payer: Self-pay | Admitting: Family Medicine

## 2022-12-17 ENCOUNTER — Encounter: Payer: BC Managed Care – PPO | Admitting: *Deleted

## 2022-12-17 ENCOUNTER — Telehealth (INDEPENDENT_AMBULATORY_CARE_PROVIDER_SITE_OTHER): Payer: BC Managed Care – PPO | Admitting: Family Medicine

## 2022-12-17 VITALS — BP 122/60 | HR 86 | Ht 65.0 in | Wt 194.0 lb

## 2022-12-17 DIAGNOSIS — Z6832 Body mass index (BMI) 32.0-32.9, adult: Secondary | ICD-10-CM

## 2022-12-17 DIAGNOSIS — Z48812 Encounter for surgical aftercare following surgery on the circulatory system: Secondary | ICD-10-CM | POA: Diagnosis not present

## 2022-12-17 DIAGNOSIS — E661 Drug-induced obesity: Secondary | ICD-10-CM | POA: Insufficient documentation

## 2022-12-17 DIAGNOSIS — Z951 Presence of aortocoronary bypass graft: Secondary | ICD-10-CM

## 2022-12-17 MED ORDER — TIRZEPATIDE-WEIGHT MANAGEMENT 5 MG/0.5ML ~~LOC~~ SOAJ: 5.0000 mg | SUBCUTANEOUS | 1 refills | Status: DC

## 2022-12-17 NOTE — Progress Notes (Signed)
Daily Session Note  Patient Details  Name: Amanda Estrada MRN: [id number] Date of Birth: 11/11/1960 Referring Provider:   Flowsheet Row Cardiac Rehab from 08/09/2022 in [facility] Cardiac and Pulmonary Rehab  Referring Provider Pleas Patricia MD       Encounter Date: 12/17/2022  Check In:  Session Check In - 12/17/22 1033       Check-In   Supervising physician immediately available to respond to emergencies See telemetry face sheet for immediately available ER MD    Location [facility]-Cardiac & Pulmonary Rehab    Staff Present Heath Lark, RN, BSN, CCRP;Jessica [place], MA, RCEP, CCRP, CCET;Joseph [place], [place]    Virtual Visit No    Medication changes reported     No    Fall or balance concerns reported    No    Warm-up and Cool-down Performed on first and last piece of equipment    Resistance Training Performed Yes    VAD Patient? No    PAD/SET Patient? No      Pain Assessment   Currently in Pain? No/denies                Social History   Tobacco Use  Smoking Status Never  Smokeless Tobacco Never    Goals Met:  Independence with exercise equipment Exercise tolerated well No report of concerns or symptoms today  Goals Unmet:  Not Applicable  Comments: Pt able to follow exercise prescription today without complaint.  Will continue to monitor for progression.    Dr. Emily Filbert is Medical Director for [place].  Dr. Ottie Glazier is Medical Director for [facility] Pulmonary Rehabilitation.

## 2022-12-17 NOTE — Progress Notes (Signed)
VIRTUAL VISIT A virtual visit is felt to be most appropriate for this patient at this time.   I connected with the patient on 12/17/22 at 12:00 PM EST by virtual telehealth platform and verified that I am speaking with the correct person using two identifiers.   I discussed the limitations, risks, security and privacy concerns of performing an evaluation and management service by  virtual telehealth platform and the availability of in person appointments. I also discussed with the patient that there may be a patient responsible charge related to this service. The patient expressed understanding and agreed to proceed.  Patient location: Home Provider Location: [place] Participants: Eliezer Lofts and Renaldo Harrison   Chief Complaint  Patient presents with   Discuss Weight Loss Medication    History of Present Illness:  62 y.o. female patient of Camdyn Laden E, MD presents  to discuss weight management.  Wt Readings from Last 3 Encounters:  12/17/22 194 lb (88 kg)  09/24/22 193 lb 4 oz (87.7 kg)  08/09/22 194 lb 12.8 oz (88.4 kg)    Associated with HTN, atrial fibrillation, OSA, CAD, high cholesterol Had open heart surgery in 6 months ago.  She has been working aggressively  with cardiac rehab 3 days a week.  Working heart healthy diet. Working on decreasing portion size, lower carb and increased water.  No calorie drinks.   COVID 19 screen No recent travel or known exposure to COVID19 The patient denies respiratory symptoms of COVID 19 at this time.  The importance of social distancing was discussed today.   Review of Systems  Constitutional:  Negative for chills and fever.  HENT:  Negative for congestion and ear pain.   Eyes:  Negative for pain and redness.  Respiratory:  Negative for cough and shortness of breath.   Cardiovascular:  Negative for chest pain, palpitations and leg swelling.  Gastrointestinal:  Negative for abdominal pain, blood in stool,  constipation, diarrhea, nausea and vomiting.  Genitourinary:  Negative for dysuria.  Musculoskeletal:  Negative for falls and myalgias.  Skin:  Negative for rash.  Neurological:  Negative for dizziness.  Psychiatric/Behavioral:  Negative for depression. The patient is not nervous/anxious.       Past Medical History:  Diagnosis Date   Allergy    Anxiety    patient denies   Asthma    Cataract    Dysplastic nevus 08/28/2018   R paraspinal mid to upper back - severe, excision 11/21/2018   Dysplastic nevus 08/28/2018   L lat breast sup - moderate   Dysplastic nevus 07/02/2019   R mid to low back 4.0 cm lat to spine - moderate   Dysplastic nevus 07/02/2019   R inframammary - moderate   GERD (gastroesophageal reflux disease)    Glaucoma    History of echocardiogram    a. 11/2015: EF 55-60%, no RWMA, nl LV diastolic fxn, PASP nl   Hypertension    Obesity    PAF (paroxysmal atrial fibrillation) ([place])    a. CHADS2VASc = 2 (HTN, sex category)   Sinusitis 2011   Recurrent per Dr. Tami Ribas with [facility]    reports that she has never smoked. She has never used smokeless tobacco. She reports current alcohol use of about 14.0 standard drinks of alcohol per week. She reports that she does not use drugs.   Current Outpatient Medications:    albuterol (VENTOLIN HFA) 108 (90 Base) MCG/ACT inhaler, Inhale 2 puffs into the lungs every 4 (four)  hours as needed for wheezing or shortness of breath., Disp: 8 g, Rfl: 2   amiodarone (PACERONE) 200 MG tablet, Take by mouth., Disp: , Rfl:    apixaban (ELIQUIS) 5 MG TABS tablet, Take 1 tablet (5 mg total) by mouth 2 (two) times daily., Disp: 60 tablet, Rfl: 11   aspirin EC 81 MG tablet, Take by mouth., Disp: , Rfl:    beclomethasone (QVAR REDIHALER) 80 MCG/ACT inhaler, Inhale 1 puff into the lungs 2 (two) times daily., Disp: 1 each, Rfl: 0   beclomethasone (QVAR) 80 MCG/ACT inhaler, Inhale 1 puff into the lungs in the morning and at bedtime., Disp: 1  each, Rfl: 12   bisoprolol (ZEBETA) 10 MG tablet, Take 1 tablet (10 mg total) by mouth daily., Disp: 90 tablet, Rfl: 3   cloNIDine (CATAPRES) 0.1 MG tablet, TAKE 1 TABLET BY MOUTH EVERYDAY AT BEDTIME, Disp: 90 tablet, Rfl: 1   diltiazem (CARDIZEM CD) 240 MG 24 hr capsule, TAKE 1 CAPSULE BY MOUTH EVERY DAY, Disp: 90 capsule, Rfl: 3   fluticasone (FLONASE) 50 MCG/ACT nasal spray, PLACE 1 SPRAY INTO BOTH NOSTRILS DAILY AS NEEDED FOR ALLERGIES., Disp: 48 mL, Rfl: 3   levocetirizine (XYZAL) 5 MG tablet, Take 5 mg by mouth at bedtime. , Disp: , Rfl:    lisinopril (ZESTRIL) 20 MG tablet, Take 20 mg by mouth daily., Disp: , Rfl:    losartan (COZAAR) 100 MG tablet, Take 100 mg by mouth daily., Disp: , Rfl:    montelukast (SINGULAIR) 10 MG tablet, TAKE 1 TABLET BY MOUTH EVERYDAY AT BEDTIME, Disp: 90 tablet, Rfl: 3   omeprazole (PRILOSEC) 40 MG capsule, Take 1 capsule (40 mg total) by mouth 2 (two) times daily., Disp: 180 capsule, Rfl: 3   rosuvastatin (CRESTOR) 40 MG tablet, Take 40 mg by mouth daily., Disp: , Rfl:    tirzepatide (ZEPBOUND) 5 MG/0.5ML Pen, Inject 5 mg into the skin once a week., Disp: 2 mL, Rfl: 1   Observations/Objective: Blood pressure 122/60, pulse 86, height 5\' 5"  (1.651 m), weight 194 lb (88 kg).  Physical Exam Constitutional:      General: The patient is not in acute distress. Pulmonary:     Effort: Pulmonary effort is normal. No respiratory distress.  Neurological:     Mental Status: The patient is alert and oriented to person, place, and time.  Psychiatric:        Mood and Affect: Mood normal.        Behavior: Behavior normal.    Assessment and Plan Class 1 drug-induced obesity with serious comorbidity and body mass index (BMI) of 32.0 to 32.9 in adult Assessment & Plan: Chronic, associated with extensive comorbidity including hypertension, atrial fibrillation, sleep apnea, high cholesterol and coronary artery disease.  She had recent open heart surgery 6 months ago.   She has been doing cardiac rehab 3 days a week for the past several months.  She has been working on aggressive dietary changes and regular exercise. She has worked on [employer] decreasing portion size and increased water intake.  She is a good candidate for a GLP-1 medication given her coronary artery disease and need for weight management.  She will continue working on aggressive lifestyle factors and we will start tirzepatide 5 mg weekly if covered by insurance.  Medication course and complications reviewed.  Return and ER precautions provided.   Other orders -     Tirzepatide-Weight Management; Inject 5 mg into the skin once a week.  Dispense:  2 mL; Refill: 1      I discussed the assessment and treatment plan with the patient. The patient was provided an opportunity to ask questions and all were answered. The patient agreed with the plan and demonstrated an understanding of the instructions.   The patient was advised to call back or seek an in-person evaluation if the symptoms worsen or if the condition fails to improve as anticipated.     Eliezer Lofts, MD

## 2022-12-17 NOTE — Assessment & Plan Note (Signed)
Chronic, associated with extensive comorbidity including hypertension, atrial fibrillation, sleep apnea, high cholesterol and coronary artery disease.  She had recent open heart surgery 6 months ago.  She has been doing cardiac rehab 3 days a week for the past several months.  She has been working on aggressive dietary changes and regular exercise. She has worked on [employer] decreasing portion size and increased water intake.  She is a good candidate for a GLP-1 medication given her coronary artery disease and need for weight management.  She will continue working on aggressive lifestyle factors and we will start tirzepatide 5 mg weekly if covered by insurance.  Medication course and complications reviewed.  Return and ER precautions provided.

## 2022-12-20 ENCOUNTER — Encounter: Payer: BC Managed Care – PPO | Admitting: *Deleted

## 2022-12-20 VITALS — Ht 65.5 in | Wt 194.3 lb

## 2022-12-20 DIAGNOSIS — Z48812 Encounter for surgical aftercare following surgery on the circulatory system: Secondary | ICD-10-CM | POA: Diagnosis not present

## 2022-12-20 DIAGNOSIS — Z951 Presence of aortocoronary bypass graft: Secondary | ICD-10-CM | POA: Diagnosis not present

## 2022-12-20 NOTE — Progress Notes (Signed)
Daily Session Note  Patient Details  Name: Amanda Estrada MRN: [id number] Date of Birth: 1961/02/09 Referring Provider:   Flowsheet Row Cardiac Rehab from 08/09/2022 in [facility] Cardiac and Pulmonary Rehab  Referring Provider Pleas Patricia MD       Encounter Date: 12/20/2022  Check In:  Session Check In - 12/20/22 1022       Check-In   Supervising physician immediately available to respond to emergencies See telemetry face sheet for immediately available ER MD    Location [facility]-Cardiac & Pulmonary Rehab    Staff Present Renita Papa, RN BSN;Joseph [place], RCP,RRT,BSRT;Noah [place], [place], Exercise Physiologist    Virtual Visit No    Medication changes reported     No    Fall or balance concerns reported    No    Warm-up and Cool-down Performed on first and last piece of equipment    Resistance Training Performed Yes    VAD Patient? No    PAD/SET Patient? No      Pain Assessment   Currently in Pain? No/denies                Social History   Tobacco Use  Smoking Status Never  Smokeless Tobacco Never    Goals Met:  Independence with exercise equipment Exercise tolerated well No report of concerns or symptoms today Strength training completed today  Goals Unmet:  Not Applicable  Comments: Pt able to follow exercise prescription today without complaint.  Will continue to monitor for progression.  [place] Name 08/09/22 1154 12/20/22 1008       6 Minute Walk   Phase Initial Discharge    Distance 1200 feet 1555 feet    Distance % Change -- 29.6 %    Distance Feet Change -- 355 ft    Walk Time 6 minutes 6 minutes    # of Rest Breaks 0 0    MPH 2.27 2.95    METS 3.21 3.6    RPE 11 13    Perceived Dyspnea  1 0    VO2 Peak 11.24 12.58    Symptoms Yes (comment) No    Comments SOB --    Resting HR 74 bpm 67 bpm    Resting BP 136/74 122/60    Resting Oxygen Saturation  100 % 98 %    Exercise Oxygen Saturation  during 6 min walk 100 % 98 %     Max Ex. HR 96 bpm 99 bpm    Max Ex. BP 174/82 144/66    2 Minute Post BP 142/70 --               Dr. Emily Filbert is Medical Director for [place].  Dr. Ottie Glazier is Medical Director for [facility] Pulmonary Rehabilitation.

## 2022-12-20 NOTE — Patient Instructions (Signed)
Discharge Patient Instructions  Patient Details  Name: Amanda Estrada MRN: [id number] Date of Birth: May 18, 1961 Referring Provider:  Jinny Sanders, MD   Number of Visits: 76  Reason for Discharge:  Patient reached a stable level of exercise. Patient independent in their exercise. Patient has met program and personal goals.  Diagnosis:  S/P CABG x 3  Initial Exercise Prescription:  Initial Exercise Prescription - 08/09/22 1100       Date of Initial Exercise RX and Referring Provider   Date 08/09/22    Referring Provider Pleas Patricia MD      Oxygen   Maintain Oxygen Saturation 88% or higher      Treadmill   MPH 2.2    Grade 1    Minutes 15    METs 2.99      Recumbant Elliptical   Level 1    RPM 50    Minutes 15    METs 3      REL-XR   Level 3    Speed 50    Minutes 15    METs 3      Prescription Details   Frequency (times per week) 3    Duration Progress to 30 minutes of continuous aerobic without signs/symptoms of physical distress      Intensity   THRR 40-80% of Max Heartrate 109-142    Ratings of Perceived Exertion 11-13    Perceived Dyspnea 0-4      Progression   Progression Continue to progress workloads to maintain intensity without signs/symptoms of physical distress.      Resistance Training   Training Prescription Yes    Weight 4 lb    Reps 10-15             Discharge Exercise Prescription (Final Exercise Prescription Changes):  Exercise Prescription Changes - 11/25/22 1400       Response to Exercise   Blood Pressure (Admit) 126/64    Blood Pressure (Exit) 112/64    Heart Rate (Admit) 73 bpm    Heart Rate (Exercise) 86 bpm    Heart Rate (Exit) 64 bpm    Rating of Perceived Exertion (Exercise) 13    Symptoms none    Duration Continue with 30 min of aerobic exercise without signs/symptoms of physical distress.    Intensity THRR unchanged      Progression   Progression Continue to progress workloads to maintain intensity  without signs/symptoms of physical distress.    Average METs 2.45      Resistance Training   Training Prescription Yes    Weight 5 lb    Reps 10-15      Interval Training   Interval Training No      Treadmill   MPH 2.8    Grade 2    Minutes 15    METs 3.91      Home Exercise Plan   Plans to continue exercise at Home (comment)   walking, staff videos   Frequency Add 2 additional days to program exercise sessions.    Initial Home Exercises Provided 08/25/22      Oxygen   Maintain Oxygen Saturation 88% or higher             Functional Capacity:  6 Minute Walk     Row Name 08/09/22 1154 12/20/22 1008       6 Minute Walk   Phase Initial Discharge    Distance 1200 feet 1555 feet    Distance % Change -- 29.6 %  Distance Feet Change -- 355 ft    Walk Time 6 minutes 6 minutes    # of Rest Breaks 0 0    MPH 2.27 2.95    METS 3.21 3.6    RPE 11 13    Perceived Dyspnea  1 0    VO2 Peak 11.24 12.58    Symptoms Yes (comment) No    Comments SOB --    Resting HR 74 bpm 67 bpm    Resting BP 136/74 122/60    Resting Oxygen Saturation  100 % 98 %    Exercise Oxygen Saturation  during 6 min walk 100 % 98 %    Max Ex. HR 96 bpm 99 bpm    Max Ex. BP 174/82 144/66    2 Minute Post BP 142/70 --             Nutrition & Weight - Outcomes:  Pre Biometrics - 08/09/22 1200       Pre Biometrics   Height 5' 5.5" (1.664 m)    Weight 194 lb 12.8 oz (88.4 kg)    Waist Circumference 38.5 inches    Hip Circumference 43.5 inches    Waist to Hip Ratio 0.89 %    BMI (Calculated) 31.91    Single Leg Stand 2.7 seconds             Post Biometrics - 12/20/22 1011        Post  Biometrics   Height 5' 5.5" (1.664 m)    Weight 194 lb 4.8 oz (88.1 kg)    Waist Circumference 41 inches    Hip Circumference 43.5 inches    Waist to Hip Ratio 0.94 %    BMI (Calculated) 31.83    Single Leg Stand 9.1 seconds             Nutrition:  Nutrition Therapy & Goals - 08/09/22  1123       Nutrition Therapy   Diet Heart healthy, low Na    Drug/Food Interactions Statins/Certain Fruits    Protein (specify units) 70-75g    Fiber 25 grams    Whole Grain Foods 3 servings    Saturated Fats 14 max. grams    Fruits and Vegetables 8 servings/day      Personal Nutrition Goals   Nutrition Goal ST: LT:    Comments 62 y.o. F admitted to cardiac rehab s/p CABG x3. PMHx includes CAD, hypercholesterolemia, HTN, paroxysmal a.fib. Relevant medications includes fluticasone, omeprazole, crestor. She has cut back on her portion sizes. L: leftovers (ex: chicken salad and some pasta salad) D: example steak tonight with potato and salad. Drinks: gatorade zero, sparkling, water. Zeeva reports using olive oil and avocado oils with cooking, however, she will like to use butter, milk, and cheese with cooking - discussed some options to lower amount of high fat dairy with cooking and encouraged to choose recipes with lower amounts of dairy at least 2-3 times per week. She reports that her lab values for sodium have been low and her MD recommended to include sodium in her diet. She checks her BP regularly and it has been normal to high; sugested limiting Na to 2300mg  at the higher end to balance lower sodium numbers and elevated BP - informed her that we may need to make adjustments to this recommendation depeing on her lab and BP numbers moving foward. She reports liking pasta - encouaged her that she can still have pasta, but add lean protein and non-starchy vegetables  so it is more balances and CHO do not take up most of the plate. Since she has decreased her portion sizes as well as starchy vegetables, discussed how heart healthy changes like increased non-starchy vegetables as a portion of the plate may cause her to be hungrier soon after eating and that healthy snacking may help to control hunger. Discussed heart healthy eating and MyPlate. Jalaiyah feels that she has the tools she needs to achieve  her goals with no major barriers reported.      Intervention Plan   Intervention Prescribe, educate and counsel regarding individualized specific dietary modifications aiming towards targeted core components such as weight, hypertension, lipid management, diabetes, heart failure and other comorbidities.;Nutrition handout(s) given to patient.    Expected Outcomes Short Term Goal: Understand basic principles of dietary content, such as calories, fat, sodium, cholesterol and nutrients.;Short Term Goal: A plan has been developed with personal nutrition goals set during dietitian appointment.;Long Term Goal: Adherence to prescribed nutrition plan.

## 2022-12-22 ENCOUNTER — Encounter: Payer: BC Managed Care – PPO | Admitting: *Deleted

## 2022-12-24 ENCOUNTER — Encounter: Payer: BC Managed Care – PPO | Admitting: *Deleted

## 2022-12-24 DIAGNOSIS — Z951 Presence of aortocoronary bypass graft: Secondary | ICD-10-CM

## 2022-12-24 DIAGNOSIS — Z48812 Encounter for surgical aftercare following surgery on the circulatory system: Secondary | ICD-10-CM | POA: Diagnosis not present

## 2022-12-24 NOTE — Progress Notes (Signed)
Daily Session Note  Patient Details  Name: Amanda Estrada MRN: [id number] Date of Birth: Apr 02, 1961 Referring Provider:   Flowsheet Row Cardiac Rehab from 08/09/2022 in [facility]  Referring Provider Pleas Patricia MD       Encounter Date: 12/24/2022  Check In:  Session Check In - 12/24/22 0948       Check-In   Supervising physician immediately available to respond to emergencies See telemetry face sheet for immediately available ER MD    Location [facility]-Cardiac & Pulmonary Rehab    Staff Present Darlyne Russian, RN, ADN;Jessica Luan Pulling, MA, RCEP, CCRP, CCET;Joseph [place], [place]    Virtual Visit No    Medication changes reported     No    Fall or balance concerns reported    No    Warm-up and Cool-down Performed on first and last piece of equipment    Resistance Training Performed Yes    VAD Patient? No    PAD/SET Patient? No      Pain Assessment   Currently in Pain? No/denies                Social History   Tobacco Use  Smoking Status Never  Smokeless Tobacco Never    Goals Met:  Independence with exercise equipment Exercise tolerated well No report of concerns or symptoms today Strength training completed today  Goals Unmet:  Not Applicable  Comments: Pt able to follow exercise prescription today without complaint.  Will continue to monitor for progression.    Dr. Emily Filbert is Medical Director for [facility].  Dr. Ottie Glazier is Medical Director for [facility].

## 2022-12-27 ENCOUNTER — Encounter: Payer: BC Managed Care – PPO | Admitting: *Deleted

## 2022-12-27 DIAGNOSIS — Z48812 Encounter for surgical aftercare following surgery on the circulatory system: Secondary | ICD-10-CM | POA: Diagnosis not present

## 2022-12-27 DIAGNOSIS — Z951 Presence of aortocoronary bypass graft: Secondary | ICD-10-CM

## 2022-12-27 NOTE — Progress Notes (Signed)
Daily Session Note  Patient Details  Name: Amanda Estrada MRN: [id number] Date of Birth: 10/20/1961 Referring Provider:   Flowsheet Row Cardiac Rehab from 08/09/2022 in [facility] Cardiac [facility] Rehab  Referring Provider Pleas Patricia MD       Encounter Date: 12/27/2022  Check In:  Session Check In - 12/27/22 1016       Check-In   Supervising physician immediately available to respond to emergencies See telemetry face sheet for immediately available ER MD    Location [facility]-Cardiac & Pulmonary Rehab    Staff Present Renita Papa, RN Odelia Gage, RN, Doyce Para, BS, ACSM CEP, Exercise Physiologist;Noah Tickle, BS, Exercise Physiologist    Virtual Visit No    Medication changes reported     No    Fall or balance concerns reported    No    Warm-up and Cool-down Performed on first and last piece of equipment    Resistance Training Performed Yes    VAD Patient? No    PAD/SET Patient? No      Pain Assessment   Currently in Pain? No/denies                Social History   Tobacco Use  Smoking Status Never  Smokeless Tobacco Never    Goals Met:  Independence with exercise equipment Exercise tolerated well No report of concerns or symptoms today Strength training completed today  Goals Unmet:  Not Applicable  Comments: Pt able to follow exercise prescription today without complaint.  Will continue to monitor for progression.    Dr. Emily Filbert is Medical Director for [facility].  Dr. Ottie Glazier is Medical Director for [facility] Pul[facility] Rehabilitation.

## 2022-12-28 ENCOUNTER — Other Ambulatory Visit: Payer: Self-pay | Admitting: Family Medicine

## 2022-12-28 MED ORDER — TIRZEPATIDE 2.5 MG/0.5ML ~~LOC~~ SOAJ: 2.5000 mg | SUBCUTANEOUS | 11 refills | Status: DC

## 2022-12-29 ENCOUNTER — Encounter: Payer: BC Managed Care – PPO | Admitting: *Deleted

## 2022-12-29 ENCOUNTER — Encounter: Payer: Self-pay | Admitting: *Deleted

## 2022-12-29 DIAGNOSIS — Z951 Presence of aortocoronary bypass graft: Secondary | ICD-10-CM

## 2022-12-29 DIAGNOSIS — Z48812 Encounter for surgical aftercare following surgery on the circulatory system: Secondary | ICD-10-CM | POA: Diagnosis not present

## 2022-12-29 NOTE — Progress Notes (Signed)
Cardiac Individual Treatment Plan  Patient Details  Name: Amanda Estrada MRN: [id number] Date of Birth: [birth year] Referring Provider:   Flowsheet Row Cardiac Rehab from 08/09/2022 in [facility] Cardiac and Pulmonary Rehab  Referring Provider Pleas Patricia MD       Initial Encounter Date:  Flowsheet Row Cardiac Rehab from 08/09/2022 in [facility] Cardiac and Pulmonary Rehab  Date 08/09/22       Visit Diagnosis: S/P CABG x 3  Patient's Home Medications on Admission:  Current Outpatient Medications:    albuterol (VENTOLIN HFA) 108 (90 Base) MCG/ACT inhaler, Inhale 2 puffs into the lungs every 4 (four) hours as needed for wheezing or shortness of breath., Disp: 8 g, Rfl: 2   amiodarone (PACERONE) 200 MG tablet, Take by mouth., Disp: , Rfl:    apixaban (ELIQUIS) 5 MG TABS tablet, Take 1 tablet (5 mg total) by mouth 2 (two) times daily., Disp: 60 tablet, Rfl: 11   aspirin EC 81 MG tablet, Take by mouth., Disp: , Rfl:    beclomethasone (QVAR REDIHALER) 80 MCG/ACT inhaler, Inhale 1 puff into the lungs 2 (two) times daily., Disp: 1 each, Rfl: 0   beclomethasone (QVAR) 80 MCG/ACT inhaler, Inhale 1 puff into the lungs in the morning and at bedtime., Disp: 1 each, Rfl: 12   bisoprolol (ZEBETA) 10 MG tablet, Take 1 tablet (10 mg total) by mouth daily., Disp: 90 tablet, Rfl: 3   cloNIDine (CATAPRES) 0.1 MG tablet, TAKE 1 TABLET BY MOUTH EVERYDAY AT BEDTIME, Disp: 90 tablet, Rfl: 1   diltiazem (CARDIZEM CD) 240 MG 24 hr capsule, TAKE 1 CAPSULE BY MOUTH EVERY DAY, Disp: 90 capsule, Rfl: 3   fluticasone (FLONASE) 50 MCG/ACT nasal spray, PLACE 1 SPRAY INTO BOTH NOSTRILS DAILY AS NEEDED FOR ALLERGIES., Disp: 48 mL, Rfl: 3   levocetirizine (XYZAL) 5 MG tablet, Take 5 mg by mouth at bedtime. , Disp: , Rfl:    lisinopril (ZESTRIL) 20 MG tablet, Take 20 mg by mouth daily., Disp: , Rfl:    losartan (COZAAR) 100 MG tablet, Take 100 mg by mouth daily., Disp: , Rfl:    montelukast (SINGULAIR) 10 MG tablet, TAKE 1  TABLET BY MOUTH EVERYDAY AT BEDTIME, Disp: 90 tablet, Rfl: 3   omeprazole (PRILOSEC) 40 MG capsule, Take 1 capsule (40 mg total) by mouth 2 (two) times daily., Disp: 180 capsule, Rfl: 3   rosuvastatin (CRESTOR) 40 MG tablet, Take 40 mg by mouth daily., Disp: , Rfl:    tirzepatide (MOUNJARO) 2.5 MG/0.5ML Pen, Inject 2.5 mg into the skin once a week., Disp: 2 mL, Rfl: 11  Past Medical History: Past Medical History:  Diagnosis Date   Allergy    Anxiety    patient denies   Asthma    Cataract    Dysplastic nevus 08/28/2018   R paraspinal mid to upper back - severe, excision 11/21/2018   Dysplastic nevus 08/28/2018   L lat breast sup - moderate   Dysplastic nevus 07/02/2019   R mid to low back 4.0 cm lat to spine - moderate   Dysplastic nevus 07/02/2019   R inframammary - moderate   GERD (gastroesophageal reflux disease)    Glaucoma    History of echocardiogram    a. 11/2015: EF 55-60%, no RWMA, nl LV diastolic fxn, PASP nl   Hypertension    Obesity    PAF (paroxysmal atrial fibrillation) ([place])    a. CHADS2VASc = 2 (HTN, sex category)   Sinusitis 2011   Recurrent per Dr. Tami Ribas  with [facility]    Tobacco Use: Social History   Tobacco Use  Smoking Status Never  Smokeless Tobacco Never    Labs: Review Flowsheet  More data exists      Latest Ref Rng & Units 10/14/2017 12/01/2018 02/18/2020 05/27/2021 09/17/2022  Labs for ITP Cardiac and Pulmonary Rehab  Cholestrol 0 - 200 mg/dL 155  158  176  160  161   LDL (calc) 0 - 99 mg/dL 48  43  62  60  45   HDL-C >39.00 mg/dL 95.40  104.90  94.50  76.60  97.60   Trlycerides 0.0 - 149.0 mg/dL 62.0  51.0  96.0  117.0  91.0      Exercise Target Goals: Exercise Program Goal: Individual exercise prescription set using results from initial 6 min walk test and THRR while considering  patient's activity barriers and safety.   Exercise Prescription Goal: Initial exercise prescription builds to 30-45 minutes a day of aerobic activity, 2-3  days per week.  Home exercise guidelines will be given to patient during program as part of exercise prescription that the participant will acknowledge.   Education: Aerobic Exercise: - Group verbal and visual presentation on the components of exercise prescription. Introduces F.I.T.T principle from ACSM for exercise prescriptions.  Reviews F.I.T.T. principles of aerobic exercise including progression. Written material given at graduation.   Education: Resistance Exercise: - Group verbal and visual presentation on the components of exercise prescription. Introduces F.I.T.T principle from ACSM for exercise prescriptions  Reviews F.I.T.T. principles of resistance exercise including progression. Written material given at graduation.    Education: Exercise & Equipment Safety: - Individual verbal instruction and demonstration of equipment use and safety with use of the equipment. Flowsheet Row Cardiac Rehab from 12/29/2022 in [facility] Cardiac and Pulmonary Rehab  Date 08/02/22  Educator [facility]  Instruction Review Code 1- Verbalizes Understanding       Education: Exercise Physiology & General Exercise Guidelines: - Group verbal and written instruction with models to review the exercise physiology of the cardiovascular system and associated critical values. Provides general exercise guidelines with specific guidelines to those with heart or lung disease.    Education: Flexibility, Balance, Mind/Body Relaxation: - Group verbal and visual presentation with interactive activity on the components of exercise prescription. Introduces F.I.T.T principle from ACSM for exercise prescriptions. Reviews F.I.T.T. principles of flexibility and balance exercise training including progression. Also discusses the mind body connection.  Reviews various relaxation techniques to help reduce and manage stress (i.e. Deep breathing, progressive muscle relaxation, and visualization). Balance handout provided to take home. Written  material given at graduation.   Activity Barriers & Risk Stratification:  Activity Barriers & Cardiac Risk Stratification - 08/09/22 1155       Activity Barriers & Cardiac Risk Stratification   Activity Barriers Deconditioning;Muscular Weakness;Balance Concerns    Cardiac Risk Stratification High             6 Minute Walk:  6 Minute Walk     Row Name 08/09/22 1154 12/20/22 1008       6 Minute Walk   Phase Initial Discharge    Distance 1200 feet 1555 feet    Distance % Change -- 29.6 %    Distance Feet Change -- 355 ft    Walk Time 6 minutes 6 minutes    # of Rest Breaks 0 0    MPH 2.27 2.95    METS 3.21 3.6    RPE 11 13    Perceived Dyspnea  1 0    VO2 Peak 11.24 12.58    Symptoms Yes (comment) No    Comments SOB --    Resting HR 74 bpm 67 bpm    Resting BP 136/74 122/60    Resting Oxygen Saturation  100 % 98 %    Exercise Oxygen Saturation  during 6 min walk 100 % 98 %    Max Ex. HR 96 bpm 99 bpm    Max Ex. BP 174/82 144/66    2 Minute Post BP 142/70 --             Oxygen Initial Assessment:   Oxygen Re-Evaluation:   Oxygen Discharge (Final Oxygen Re-Evaluation):   Initial Exercise Prescription:  Initial Exercise Prescription - 08/09/22 1100       Date of Initial Exercise RX and Referring Provider   Date 08/09/22    Referring Provider Pleas Patricia MD      Oxygen   Maintain Oxygen Saturation 88% or higher      Treadmill   MPH 2.2    Grade 1    Minutes 15    METs 2.99      Recumbant Elliptical   Level 1    RPM 50    Minutes 15    METs 3      REL-XR   Level 3    Speed 50    Minutes 15    METs 3      Prescription Details   Frequency (times per week) 3    Duration Progress to 30 minutes of continuous aerobic without signs/symptoms of physical distress      Intensity   THRR 40-80% of Max Heartrate 109-142    Ratings of Perceived Exertion 11-13    Perceived Dyspnea 0-4      Progression   Progression Continue to progress  workloads to maintain intensity without signs/symptoms of physical distress.      Resistance Training   Training Prescription Yes    Weight 4 lb    Reps 10-15             Perform Capillary Blood Glucose checks as needed.  Exercise Prescription Changes:   Exercise Prescription Changes     Row Name 08/09/22 1100 08/17/22 1500 08/25/22 1000 08/30/22 1000 09/15/22 1600     Response to Exercise   Blood Pressure (Admit) 136/74 128/62 -- 118/64 122/60   Blood Pressure (Exercise) 174/82 142/64 -- -- 164/70   Blood Pressure (Exit) 142/70 122/62 -- 126/60 122/78   Heart Rate (Admit) 75 bpm 77 bpm -- 90 bpm 88 bpm   Heart Rate (Exercise) 96 bpm 104 bpm -- 122 bpm 124 bpm   Heart Rate (Exit) 78 bpm 75 bpm -- 105 bpm 90 bpm   Oxygen Saturation (Admit) 100 % -- -- -- --   Oxygen Saturation (Exercise) 100 % -- -- -- --   Rating of Perceived Exertion (Exercise) 11 12 -- 13 13   Perceived Dyspnea (Exercise) 1 -- -- -- --   Symptoms SOB none -- none none   Comments walk test results -- -- -- --   Duration -- Continue with 30 min of aerobic exercise without signs/symptoms of physical distress. -- Continue with 30 min of aerobic exercise without signs/symptoms of physical distress. Continue with 30 min of aerobic exercise without signs/symptoms of physical distress.   Intensity -- THRR unchanged -- THRR unchanged THRR unchanged     Progression   Progression -- Continue to progress workloads to maintain  intensity without signs/symptoms of physical distress. -- Continue to progress workloads to maintain intensity without signs/symptoms of physical distress. Continue to progress workloads to maintain intensity without signs/symptoms of physical distress.   Average METs -- 2.65 -- 2.88 2.86     Resistance Training   Training Prescription -- Yes -- Yes Yes   Weight -- 4 lb -- 4 lb 4 lb   Reps -- 10-15 -- 10-15 10-15     Interval Training   Interval Training -- No -- No No     Treadmill   MPH  -- 2.2 -- 2.8 2.8   Grade -- 1 -- 1 1   Minutes -- 15 -- 15 15   METs -- 2.99 -- 3.53 3.53     Recumbant Elliptical   Level -- 2 -- 1.6 2   Minutes -- 15 -- 15 15   METs -- 2.1 -- 1.7 2     REL-XR   Level -- 4 -- 5 2   Minutes -- 15 -- 15 15   METs -- 2.5 -- 3 --     Home Exercise Plan   Plans to continue exercise at -- -- Home (comment)  walking, staff videos Home (comment)  walking, staff videos Home (comment)  walking, staff videos   Frequency -- -- Add 2 additional days to program exercise sessions. Add 2 additional days to program exercise sessions. Add 2 additional days to program exercise sessions.   Initial Home Exercises Provided -- -- 08/25/22 08/25/22 08/25/22     Oxygen   Maintain Oxygen Saturation -- 88% or higher -- 88% or higher 88% or higher    Row Name 09/28/22 1400 10/14/22 1500 10/26/22 1400 11/11/22 0900 11/25/22 1400     Response to Exercise   Blood Pressure (Admit) 128/62 118/60 130/66 118/70 126/64   Blood Pressure (Exercise) 148/58 -- -- -- --   Blood Pressure (Exit) 128/64 132/68 128/60 126/70 112/64   Heart Rate (Admit) 75 bpm 62 bpm 65 bpm 61 bpm 73 bpm   Heart Rate (Exercise) 109 bpm 98 bpm 89 bpm 89 bpm 86 bpm   Heart Rate (Exit) 84 bpm 69 bpm 66 bpm 69 bpm 64 bpm   Rating of Perceived Exertion (Exercise) 13 13 13 13 13    Symptoms none none none none none   Duration Continue with 30 min of aerobic exercise without signs/symptoms of physical distress. Continue with 30 min of aerobic exercise without signs/symptoms of physical distress. Continue with 30 min of aerobic exercise without signs/symptoms of physical distress. Continue with 30 min of aerobic exercise without signs/symptoms of physical distress. Continue with 30 min of aerobic exercise without signs/symptoms of physical distress.   Intensity THRR unchanged THRR unchanged THRR unchanged THRR unchanged THRR unchanged     Progression   Progression Continue to progress workloads to maintain  intensity without signs/symptoms of physical distress. Continue to progress workloads to maintain intensity without signs/symptoms of physical distress. Continue to progress workloads to maintain intensity without signs/symptoms of physical distress. Continue to progress workloads to maintain intensity without signs/symptoms of physical distress. Continue to progress workloads to maintain intensity without signs/symptoms of physical distress.   Average METs 2.98 2.7 0.36 2.78 2.45     Resistance Training   Training Prescription Yes Yes Yes Yes Yes   Weight 5 lb 5 lb 5 lb 5 lb 5 lb   Reps 10-15 10-15 10-15 10-15 10-15     Interval Training   Interval Training -- No  No No No     Treadmill   MPH 3 2.7 2.7 2.8 2.8   Grade 1.5 1 2 2 2    Minutes 15 15 15 15 15    METs 3.92 3.44 3.81 3.91 3.91     NuStep   Level -- -- 4 -- --   Minutes -- -- 15 -- --   METs -- -- 4 -- --     Recumbant Elliptical   Level 1 1.5 1 1  --   Minutes 15 15 15 15  --   METs 1.6 1.8 -- 1.8 --     REL-XR   Level 3 2 3 3  --   Minutes 15 15 15 15  --   METs 2.4 2.1 -- -- --     T5 Nustep   Level 1 -- -- -- --   Minutes 15 -- -- -- --   METs 1.8 -- -- -- --     Home Exercise Plan   Plans to continue exercise at Home (comment)  walking, staff videos Home (comment)  walking, staff videos Home (comment)  walking, staff videos Home (comment)  walking, staff videos Home (comment)  walking, staff videos   Frequency Add 2 additional days to program exercise sessions. Add 2 additional days to program exercise sessions. Add 2 additional days to program exercise sessions. Add 2 additional days to program exercise sessions. Add 2 additional days to program exercise sessions.   Initial Home Exercises Provided 08/25/22 08/25/22 08/25/22 08/25/22 08/25/22     Oxygen   Maintain Oxygen Saturation 88% or higher 88% or higher 88% or higher 88% or higher 88% or higher    Row Name 12/22/22 1300             Response to  Exercise   Blood Pressure (Admit) 122/62       Blood Pressure (Exit) 122/62       Heart Rate (Admit) 71 bpm       Heart Rate (Exercise) 89 bpm       Heart Rate (Exit) 74 bpm       Rating of Perceived Exertion (Exercise) 13       Symptoms none       Duration Continue with 30 min of aerobic exercise without signs/symptoms of physical distress.       Intensity THRR unchanged         Progression   Progression Continue to progress workloads to maintain intensity without signs/symptoms of physical distress.       Average METs 3.16         Resistance Training   Training Prescription Yes       Weight 5 lb       Reps 10-15         Interval Training   Interval Training No         Treadmill   MPH 2.7       Grade 2       Minutes 15       METs 3.81         Recumbant Bike   Level 3       Watts 25       Minutes 15       METs 2.89         Recumbant Elliptical   Level 1.2       Minutes 15       METs 1.7         REL-XR   Level 3  Minutes 15         Home Exercise Plan   Plans to continue exercise at Home (comment)  walking, staff videos       Frequency Add 2 additional days to program exercise sessions.       Initial Home Exercises Provided 08/25/22         Oxygen   Maintain Oxygen Saturation 88% or higher                Exercise Comments:   Exercise Comments     Row Name 08/11/22 1035           Exercise Comments First full day of exercise!  Patient was oriented to gym and equipment including functions, settings, policies, and procedures.  Patient's individual exercise prescription and treatment plan were reviewed.  All starting workloads were established based on the results of the 6 minute walk test done at initial orientation visit.  The plan for exercise progression was also introduced and progression will be customized based on patient's performance and goals.                Exercise Goals and Review:   Exercise Goals     Row Name 08/09/22 1200              Exercise Goals   Increase Physical Activity Yes       Intervention Provide advice, education, support and counseling about physical activity/exercise needs.;Develop an individualized exercise prescription for aerobic and resistive training based on initial evaluation findings, risk stratification, comorbidities and participant's personal goals.       Expected Outcomes Short Term: Attend rehab on a regular basis to increase amount of physical activity.;Long Term: Add in home exercise to make exercise part of routine and to increase amount of physical activity.;Long Term: Exercising regularly at least 3-5 days a week.       Increase Strength and Stamina Yes       Intervention Provide advice, education, support and counseling about physical activity/exercise needs.;Develop an individualized exercise prescription for aerobic and resistive training based on initial evaluation findings, risk stratification, comorbidities and participant's personal goals.       Expected Outcomes Short Term: Increase workloads from initial exercise prescription for resistance, speed, and METs.;Short Term: Perform resistance training exercises routinely during rehab and add in resistance training at home;Long Term: Improve cardiorespiratory fitness, muscular endurance and strength as measured by increased METs and functional capacity (6MWT)       Able to understand and use rate of perceived exertion (RPE) scale Yes       Intervention Provide education and explanation on how to use RPE scale       Expected Outcomes Short Term: Able to use RPE daily in rehab to express subjective intensity level;Long Term:  Able to use RPE to guide intensity level when exercising independently       Able to understand and use Dyspnea scale Yes       Intervention Provide education and explanation on how to use Dyspnea scale       Expected Outcomes Short Term: Able to use Dyspnea scale daily in rehab to express subjective sense of  shortness of breath during exertion;Long Term: Able to use Dyspnea scale to guide intensity level when exercising independently       Knowledge and understanding of Target Heart Rate Range (THRR) Yes       Intervention Provide education and explanation of THRR including how the numbers were predicted and  where they are located for reference       Expected Outcomes Short Term: Able to state/look up THRR;Short Term: Able to use daily as guideline for intensity in rehab;Long Term: Able to use THRR to govern intensity when exercising independently       Able to check pulse independently Yes       Intervention Provide education and demonstration on how to check pulse in carotid and radial arteries.;Review the importance of being able to check your own pulse for safety during independent exercise       Expected Outcomes Short Term: Able to explain why pulse checking is important during independent exercise;Long Term: Able to check pulse independently and accurately       Understanding of Exercise Prescription Yes       Intervention Provide education, explanation, and written materials on patient's individual exercise prescription       Expected Outcomes Short Term: Able to explain program exercise prescription;Long Term: Able to explain home exercise prescription to exercise independently                Exercise Goals Re-Evaluation :  Exercise Goals Re-Evaluation     Row Name 08/11/22 1035 08/17/22 1508 08/25/22 1034 08/30/22 1052 09/15/22 1618     Exercise Goal Re-Evaluation   Exercise Goals Review Able to understand and use rate of perceived exertion (RPE) scale;Able to understand and use Dyspnea scale;Knowledge and understanding of Target Heart Rate Range (THRR);Understanding of Exercise Prescription Understanding of Exercise Prescription;Increase Physical Activity;Increase Strength and Stamina Understanding of Exercise Prescription;Increase Physical Activity;Increase Strength and Stamina;Able to  understand and use rate of perceived exertion (RPE) scale;Able to understand and use Dyspnea scale;Able to check pulse independently;Knowledge and understanding of Target Heart Rate Range (THRR) Increase Physical Activity;Increase Strength and Stamina;Understanding of Exercise Prescription Increase Physical Activity;Increase Strength and Stamina;Understanding of Exercise Prescription   Comments Reviewed RPE and dyspnea scales, THR and program prescription with pt today.  Pt voiced understanding and was given a copy of goals to take home. Leshawn is off to a good start in rehab. She recently improved her overall average MET level to 2.65 METs. She also did well on the treadmill at a speed of 2.2 mph and an incline of 1%. She tolerated 4 lb hand weights for resistance training as well. We will continue to monitor her progress in the program. Reviewed home exercise with pt today.  Pt plans to walk at home and at beach for exercise.  Reviewed THR, pulse, RPE, sign and symptoms, pulse oximetery and when to call 911 or MD.  Also discussed weather considerations and indoor options.  Pt voiced understanding. Trent is doing well for the first couple weeks she has been here for rehab. She has increased her treadmill speed to a 2.8 mph with a 1% incline.  She is also already up to level 5 on the XR using 4 lb handweights. We will continue to monitor as she progresses in the program. Jasline continues to do well in rehab. Her MET level has stayed consistently above 2.8 METs. She also has continued to walk on the treadmill at a speed of 2.8 mph and an incline of 1%. We will continue to monitor her progress in the program.   Expected Outcomes Short: Use RPE daily to regulate intensity. Long: Follow program prescription in THR. Short: Continue to follow current exercise prescription. Long: Continue to increase strength and stamina. Short: Continue to walk on off days Long: Continue to improve stamina Short:  Continue to increase  worklod on treadmill Long: Continue to increase overall MET level Short: Continue to increase workloads as tolerated. Long: Continue to increase strength and stamina.    [place] Name 09/20/22 1355 09/28/22 1455 10/11/22 1343 10/14/22 1504 10/26/22 1440     Exercise Goal Re-Evaluation   Exercise Goals Review Increase Physical Activity;Increase Strength and Stamina;Understanding of Exercise Prescription Increase Physical Activity;Increase Strength and Stamina;Understanding of Exercise Prescription Increase Physical Activity;Increase Strength and Stamina;Understanding of Exercise Prescription Increase Physical Activity;Increase Strength and Stamina;Understanding of Exercise Prescription Increase Physical Activity;Increase Strength and Stamina;Understanding of Exercise Prescription   Comments Rhianon is doing well with her exercise at home. She is walking outdoor for about 20-30 minutes. Encouraged to reach the minimum 30 minutes at once. She goes to the beach once per month and walks there as well. We reiterated the weather conditions now that it is getting colder out and to stay indoors when it gets colder out. She does check her HR with her watch and pulse ox but says it doesn't get up over 100 bpm. She doesn't normally hit her THR but is aware most likely from her medications. Falon continues to do well in rehab.  She has increased to working 3 mph/1% incline on the treadmill! She is working at level 3 on the XR and could benefit from increasing her levels on both the T5 Nustep and REL. She is now using 5 lb handweights for resistance training as well. Will continue to monitor. Debbe is doing well in rehab. She is staying busy at home and staying active.  She is is getting in some walking, but not enough exercise.  She does note that her stamina is improving overall. Tsuruko continues to do well in rehab. She has consistently worked at an average of 2.7 METs. She also has done well on the XR at level 2 and the REL  at level 1.5. We will continue to monitor her progress in the program. We will continue to monitor her progress in the program. Melaney continues to do well. She is not quite hitting her THR but her RPEs are staying in appropriate range. Her treadmill workloads vary each session and will remind patient to progressively increase her levels. She did work up to 4 METs on the T4 Nustep which she has never done before. We will continue to monitor.   Expected Outcomes Short: Continue to hit 30 minutes with walking at home Long: Continue to exercise independently Short: Increase level on REL and T5 Long: Continue to increase overall MET level Short: Make time to exercise Long; Conintue to improve stamina Short: Stay consistent with workload on the treadmill. Long: Continue to increase strength and stamina. Short: Continue to increase level on T4 Nustep Long: Continue to increase overall MET level    Row Name 11/11/22 0945 11/24/22 1010 11/25/22 1437 12/09/22 1501 12/13/22 1007     Exercise Goal Re-Evaluation   Exercise Goals Review Increase Physical Activity;Increase Strength and Stamina;Understanding of Exercise Prescription Increase Physical Activity;Increase Strength and Stamina;Understanding of Exercise Prescription Increase Physical Activity;Increase Strength and Stamina;Understanding of Exercise Prescription -- Increase Physical Activity;Increase Strength and Stamina;Understanding of Exercise Prescription   Comments Temica has been doing well in rehab. She was able to improve her speed on the treadmill back up to 2.8 mph while maintaining a 2% incline. She also has stayed consistent with her workloads on both the XR at level 3 and the recumbent elliptical at level 1. We will continue to monitor her progress.  Brylea returned to rehab today.  She will be gone again the rest of the month for MD appt and going back to beach.  She has been out due to [place].  She hopes that now that she is feeling better she can get  back to exercise routinely again.  She is planning to finish up rehab in February as she does not currently have anything scheduled at this time. Makina returned to rehab after being out due to covid. She worked at her exercise prescription that she did prior to getting sick. She eased back into everything with appropriate RPEs. We will continue to monitor once she is back into the routine. Valeria has not attended rehab since 11/24/2022, due to her being out of town. We will continue to monitor her progress when she returns to regular attendance. Mackie continues to walk indoors and outdoors for her home exercise. She is  going to [facility] to walk as well which she goes to once per month. She is checking her HR but continues to not go past 100 bpm. She understands this is from her medications.   Expected Outcomes Short: Continue to increase workload on REL. Long: Continue to improve strength and stamina. Short; Return to regular exercise and attend rehab regularly Long: Continue to improve stamina again Short: Continue rehab with good attendance Long: Continue to improve overall MET level Short: Continue rehab with good attendance. Long: Continue to improve strength and stamina. Short: Continue walking at home Long: Continue to exercise independently    [place] Name 12/22/22 1401             Exercise Goal Re-Evaluation   Exercise Goals Review Increase Physical Activity;Increase Strength and Stamina;Understanding of Exercise Prescription       Comments Bryanah continues to do well in rehab. She completed her post 6MWT and improved by almost 30%!  Her RPEs are staying appropriate. She has been consistently at 2% incline on the treadmill and will encourage her to increase that over time. We will continue to monitor. She will be graduating in the next couple of weeks.       Expected Outcomes Short: Graduate Long: Continue to increase overall MET level                Discharge Exercise Prescription  (Final Exercise Prescription Changes):  Exercise Prescription Changes - 12/22/22 1300       Response to Exercise   Blood Pressure (Admit) 122/62    Blood Pressure (Exit) 122/62    Heart Rate (Admit) 71 bpm    Heart Rate (Exercise) 89 bpm    Heart Rate (Exit) 74 bpm    Rating of Perceived Exertion (Exercise) 13    Symptoms none    Duration Continue with 30 min of aerobic exercise without signs/symptoms of physical distress.    Intensity THRR unchanged      Progression   Progression Continue to progress workloads to maintain intensity without signs/symptoms of physical distress.    Average METs 3.16      Resistance Training   Training Prescription Yes    Weight 5 lb    Reps 10-15      Interval Training   Interval Training No      Treadmill   MPH 2.7    Grade 2    Minutes 15    METs 3.81      Recumbant Bike   Level 3    Watts 25    Minutes 15  METs 2.89      Recumbant Elliptical   Level 1.2    Minutes 15    METs 1.7      REL-XR   Level 3    Minutes 15      Home Exercise Plan   Plans to continue exercise at Home (comment)   walking, staff videos   Frequency Add 2 additional days to program exercise sessions.    Initial Home Exercises Provided 08/25/22      Oxygen   Maintain Oxygen Saturation 88% or higher             Nutrition:  Target Goals: Understanding of nutrition guidelines, daily intake of sodium 1500mg , cholesterol 200mg , calories 30% from fat and 7% or less from saturated fats, daily to have 5 or more servings of fruits and vegetables.  Education: All About Nutrition: -Group instruction provided by verbal, written material, interactive activities, discussions, models, and posters to present general guidelines for heart healthy nutrition including fat, fiber, MyPlate, the role of sodium in heart healthy nutrition, utilization of the nutrition label, and utilization of this knowledge for meal planning. Follow up email sent as well. Written  material given at graduation. Flowsheet Row Cardiac Rehab from 12/29/2022 in [facility] Cardiac and Pulmonary Rehab  Education need identified 08/09/22       Biometrics:  Pre Biometrics - 08/09/22 1200       Pre Biometrics   Height 5' 5.5" (1.664 m)    Weight 194 lb 12.8 oz (88.4 kg)    Waist Circumference 38.5 inches    Hip Circumference 43.5 inches    Waist to Hip Ratio 0.89 %    BMI (Calculated) 31.91    Single Leg Stand 2.7 seconds             Post Biometrics - 12/20/22 1011        Post  Biometrics   Height 5' 5.5" (1.664 m)    Weight 194 lb 4.8 oz (88.1 kg)    Waist Circumference 41 inches    Hip Circumference 43.5 inches    Waist to Hip Ratio 0.94 %    BMI (Calculated) 31.83    Single Leg Stand 9.1 seconds             Nutrition Therapy Plan and Nutrition Goals:  Nutrition Therapy & Goals - 08/09/22 1123       Nutrition Therapy   Diet Heart healthy, low Na    Drug/Food Interactions Statins/Certain Fruits    Protein (specify units) 70-75g    Fiber 25 grams    Whole Grain Foods 3 servings    Saturated Fats 14 max. grams    Fruits and Vegetables 8 servings/day      Personal Nutrition Goals   Nutrition Goal ST: LT:    Comments 62 y.o. F admitted to cardiac rehab s/p CABG x3. PMHx includes CAD, hypercholesterolemia, HTN, paroxysmal a.fib. Relevant medications includes fluticasone, omeprazole, crestor. She has cut back on her portion sizes. L: leftovers (ex: chicken salad and some pasta salad) D: example steak tonight with potato and salad. Drinks: gatorade zero, sparkling, water. Javaeh reports using olive oil and avocado oils with cooking, however, she will like to use butter, milk, and cheese with cooking - discussed some options to lower amount of high fat dairy with cooking and encouraged to choose recipes with lower amounts of dairy at least 2-3 times per week. She reports that her lab values for sodium have been low and her MD recommended  to include sodium in  her diet. She checks her BP regularly and it has been normal to high; sugested limiting Na to 2300mg  at the higher end to balance lower sodium numbers and elevated BP - informed her that we may need to make adjustments to this recommendation depeing on her lab and BP numbers moving foward. She reports liking pasta - encouaged her that she can still have pasta, but add lean protein and non-starchy vegetables so it is more balances and CHO do not take up most of the plate. Since she has decreased her portion sizes as well as starchy vegetables, discussed how heart healthy changes like increased non-starchy vegetables as a portion of the plate may cause her to be hungrier soon after eating and that healthy snacking may help to control hunger. Discussed heart healthy eating and MyPlate. Tiffony feels that she has the tools she needs to achieve her goals with no major barriers reported.      Intervention Plan   Intervention Prescribe, educate and counsel regarding individualized specific dietary modifications aiming towards targeted core components such as weight, hypertension, lipid management, diabetes, heart failure and other comorbidities.;Nutrition handout(s) given to patient.    Expected Outcomes Short Term Goal: Understand basic principles of dietary content, such as calories, fat, sodium, cholesterol and nutrients.;Short Term Goal: A plan has been developed with personal nutrition goals set during dietitian appointment.;Long Term Goal: Adherence to prescribed nutrition plan.             Nutrition Assessments:  MEDIFICTS Score Key: ?70 Need to make dietary changes  40-70 Heart Healthy Diet ? 40 Therapeutic Level Cholesterol Diet  Flowsheet Row Cardiac Rehab from 12/20/2022 in [facility] Cardiac and Pulmonary Rehab  Picture Your Plate Total Score on Discharge 63      Picture Your Plate Scores: <57 Unhealthy dietary pattern with much room for improvement. 41-50 Dietary pattern unlikely to meet  recommendations for good health and room for improvement. 51-60 More healthful dietary pattern, with some room for improvement.  >60 Healthy dietary pattern, although there may be some specific behaviors that could be improved.    Nutrition Goals Re-Evaluation:  Nutrition Goals Re-Evaluation     Row Name 08/25/22 1026 09/20/22 1358 10/11/22 1347 11/24/22 1014 12/13/22 1014     Goals   Nutrition Goal Short Term Goal: Understand basic principles of dietary content, such as calories, fat, sodium, cholesterol and nutrients.; Short Term Goal: A plan has been developed with personal nutrition goals set during dietitian appointment.; Long Term Goal: Adherence to prescribed nutrition plan. Short Term Goal: Understand basic principles of dietary content, such as calories, fat, sodium, cholesterol and nutrients.; Short Term Goal: A plan has been developed with personal nutrition goals set during dietitian appointment.; Long Term Goal: Adherence to prescribed nutrition plan. Short: Talk to doctor about amount of sodium intake Long: Continue to eat heart healthy and follow RD recommendations Short: Continue watch sodium with doctor Long: Continue to eat heart healthy Short: Continue watch sodium with doctor Long: Continue to eat heart healthy   Comment Ezri is doing well with her diet.  She has gotten better about not snacking after dinner. She is being more diligent about watching her sodium and reading food labels. She is still doing well with her portion control as she and her husband share meals to help. She is still getting more variety and trying to add in more vegetables to meals too. She has started to use leaner proteins as well Ahlia states  she has not made any huge changes, however, she is avoiding fried foods and higher saturated foods. Her doctor told her from her recent bloodwork that her sodium is low and to eat more in her diet. Encouraged her to ask how much she should have to avoid an excess  amount while managing her heart failure at the same time. She has her physical next week to review her bloodwork Oceane is doing well in rehab.  She is working on her diet.  She had a hot dog today but planning on a salad next week.  Her sodium is still running low.  She is drinking 1/2 gatorade. We talked about mixing her gatorade with water to help add more too.  She is aiming to get in vareity in her diet. Priscillia is doing well in rehab.  She has gotten back on track for her diet.  She is watching her sodium levels still and drinking lots of water.  She feels that she has a good handle on her diet. Danaka discussed with her doctor on sodium levels and was told to not worry about specific mg on sodium as it has been too low. She is  drinking more water but not in excess. Her doctor just told her to be aware of symptoms if she starts to not feel right. Her and her husband share a lot of meals for portion control. She eats primarily chicken, tuna, for protein. She eats filet for limited time and only eats about 3 oz at one time.   Expected Outcome Short: Continue to add in vegetables Long: Conitnue to focus on heart healhty eating. Short: Talk to doctor about amount of sodium intake Long: Continue to eat heart healthy and follow RD recommendations Short: Continue watch sodium with doctor Long: Continue to eat heart healthy Short: Continue to add variety back in Long: Continue to focus on heart healthy eating Short: Continue to watch sodium and monitor symptoms Long: Continue to eat heart friendly diets            Nutrition Goals Discharge (Final Nutrition Goals Re-Evaluation):  Nutrition Goals Re-Evaluation - 12/13/22 1014       Goals   Nutrition Goal Short: Continue watch sodium with doctor Long: Continue to eat heart healthy    Comment Dessirae discussed with her doctor on sodium levels and was told to not worry about specific mg on sodium as it has been too low. She is  drinking more water but not in  excess. Her doctor just told her to be aware of symptoms if she starts to not feel right. Her and her husband share a lot of meals for portion control. She eats primarily chicken, tuna, for protein. She eats filet for limited time and only eats about 3 oz at one time.    Expected Outcome Short: Continue to watch sodium and monitor symptoms Long: Continue to eat heart friendly diets             Psychosocial: Target Goals: Acknowledge presence or absence of significant depression and/or stress, maximize coping skills, provide positive support system. Participant is able to verbalize types and ability to use techniques and skills needed for reducing stress and depression.   Education: Stress, Anxiety, and Depression - Group verbal and visual presentation to define topics covered.  Reviews how body is impacted by stress, anxiety, and depression.  Also discusses healthy ways to reduce stress and to treat/manage anxiety and depression.  Written material given at graduation. Flowsheet Row  Cardiac Rehab from 12/29/2022 in [facility] Cardiac and Pulmonary Rehab  Education need identified 08/09/22       Education: Sleep Hygiene -Provides group verbal and written instruction about how sleep can affect your health.  Define sleep hygiene, discuss sleep cycles and impact of sleep habits. Review good sleep hygiene tips.    Initial Review & Psychosocial Screening:  Initial Psych Review & Screening - 08/02/22 1125       Initial Review   Current issues with None Identified      Family Dynamics   Good Support System? Yes    Comments Starletta can look to her spouse family and freinds for support. She feels good about her health and is ready to make changes.      Barriers   Psychosocial barriers to participate in program The patient should benefit from training in stress management and relaxation.;There are no identifiable barriers or psychosocial needs.      Screening Interventions   Interventions  Encouraged to exercise;To provide support and resources with identified psychosocial needs;Provide feedback about the scores to participant    Expected Outcomes Short Term goal: Utilizing psychosocial counselor, staff and physician to assist with identification of specific Stressors or current issues interfering with healing process. Setting desired goal for each stressor or current issue identified.;Long Term Goal: Stressors or current issues are controlled or eliminated.;Short Term goal: Identification and review with participant of any Quality of Life or Depression concerns found by scoring the questionnaire.;Long Term goal: The participant improves quality of Life and PHQ9 Scores as seen by post scores and/or verbalization of changes             Quality of Life Scores:   Quality of Life - 12/20/22 1112       Quality of Life   Select Quality of Life      Quality of Life Scores   Health/Function Pre 26 %    Health/Function Post 25.6 %    Health/Function % Change -1.54 %    Socioeconomic Pre 23.75 %    Socioeconomic Post 30 %    Socioeconomic % Change  26.32 %    Psych/Spiritual Pre 30 %    Psych/Spiritual Post 27.43 %    Psych/Spiritual % Change -8.57 %    Family Pre 28.8 %    Family Post 28.8 %    Family % Change 0 %    GLOBAL Pre 26.69 %    GLOBAL Post 27.19 %    GLOBAL % Change 1.87 %            Scores of 19 and below usually indicate a poorer quality of life in these areas.  A difference of  2-3 points is a clinically meaningful difference.  A difference of 2-3 points in the total score of the Quality of Life Index has been associated with significant improvement in overall quality of life, self-image, physical symptoms, and general health in studies assessing change in quality of life.  PHQ-9: Review Flowsheet  More data exists      12/20/2022 08/09/2022 06/26/2021 02/21/2020 12/05/2018  Depression screen PHQ 2/9  Decreased Interest 0 0 0 0 0  Down, Depressed,  Hopeless 0 0 0 0 0  PHQ - 2 Score 0 0 0 0 0  Altered sleeping 0 0 - - -  Tired, decreased energy 0 0 - - -  Change in appetite 0 0 - - -  Feeling bad or failure about yourself  0 0 - - -  Trouble concentrating 0 0 - - -  Moving slowly or fidgety/restless 0 0 - - -  Suicidal thoughts 0 0 - - -  PHQ-9 Score 0 0 - - -  Difficult doing work/chores Not difficult at all Not difficult at all - - -   Interpretation of Total Score  Total Score Depression Severity:  1-4 = Minimal depression, 5-9 = Mild depression, 10-14 = Moderate depression, 15-19 = Moderately severe depression, 20-27 = Severe depression   Psychosocial Evaluation and Intervention:  Psychosocial Evaluation - 08/02/22 1127       Psychosocial Evaluation & Interventions   Interventions Encouraged to exercise with the program and follow exercise prescription;Relaxation education;Stress management education    Comments Jorja can look to her spouse family and freinds for support. She feels good about her health and is ready to make changes.    Expected Outcomes Short: Start HeartTrack to help with mood. Long: Maintain a healthy mental state    Continue Psychosocial Services  Follow up required by staff             Psychosocial Re-Evaluation:  Psychosocial Re-Evaluation     [place] Name 08/25/22 1024 09/20/22 1406 10/11/22 1345 11/24/22 1012 12/13/22 1009     Psychosocial Re-Evaluation   Current issues with Current Stress Concerns Current Stress Concerns Current Stress Concerns;None Identified Current Stress Concerns;None Identified None Identified   Comments Rose is doing well in rehab. She has started back to work.  She is working from home so that is helping. She did need to switch her Mondays to an afternoon appt due to a work conference call.  She is sleeping until 330-4am and then wakes, unable to get back to sleep.  Her legs from surgery are painful and she is also waking with coughing and they are going to try to change  something else will help. She seeing PA next week for that. Donzella is doing well mentally. 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 She may have a retirement party.  She cuts her phone off to dissociate away from work. She goes to the beach once per month which really helps her get her mind off things. Her birthday was a couple days ago and went to [facility] to celebrate! Her cough is completely gone as her doctor pinpointed which exact medication was causing that side effect. She feels much better. She did not sleep well last night but not normally it is not a problem. She'll continue to watch it and speak up if it becomes a problem. Salina is doing well in rehab. She does not claim to have any stressors at this time.  She is counting down to retirement.  14 days left!  She is just letting things go at work and not worring about it.  She went to beach for Thanksgiving and enjoyed it. She plans to go back next week again.  She continues to sleep well. Dae returned to rehab today. She will be gone again the rest of the month for MD appt and going back to beach. She has been out due to [place].  She is starting to feel better again.  She hopes to get back to doing what she likes now that she can leave the house again.  She is sleeping well normally, but now the predinisone is keeping her awake some at night.  She is enjoying retirement. Lylliana is recovering from having  covid but is feeling better overall. She just retired in december! She feels more relaxed, but still dealing with paperwork.  She is not too free with time yet as she spends a lot of time for  her mom and  MIL taking care of them. She has good support from her spouse! She denies any other concerns at this time.   Expected Outcomes Short; Figure which med is causing her coughing Long: Continue to cope with return to work. Short: Retire in December! Long: Continue to maintain  positive attitude Short: Countdown to retirement Long: conitnue to stay positive Short: Enjoy beach trip Long: Continue to exercise for mental boost Short: Continue to come to rehab for mood boost Long: Continue to maintain positive attitude   Interventions Stress management education;Encouraged to attend Cardiac Rehabilitation for the exercise Encouraged to attend Cardiac Rehabilitation for the exercise Encouraged to attend Cardiac Rehabilitation for the exercise Encouraged to attend Cardiac Rehabilitation for the exercise Encouraged to attend Cardiac Rehabilitation for the exercise   Continue Psychosocial Services  Follow up required by staff Follow up required by staff Follow up required by staff Follow up required by staff Follow up required by staff            Psychosocial Discharge (Final Psychosocial Re-Evaluation):  Psychosocial Re-Evaluation - 12/13/22 1009       Psychosocial Re-Evaluation   Current issues with None Identified    Comments Darsha is recovering from having covid but is feeling better overall. She just retired in december! She feels more relaxed, but still dealing with paperwork.  She is not too free with time yet as she spends a lot of time for  her mom and  MIL taking care of them. She has good support from her spouse! She denies any other concerns at this time.    Expected Outcomes Short: Continue to come to rehab for mood boost Long: Continue to maintain positive attitude    Interventions Encouraged to attend Cardiac Rehabilitation for the exercise    Continue Psychosocial Services  Follow up required by staff             Vocational Rehabilitation: Provide vocational rehab assistance to qualifying candidates.   Vocational Rehab Evaluation & Intervention:   Education: Education Goals: Education classes will be provided on a variety of topics geared toward better understanding of heart health and risk factor modification. Participant will state  understanding/return demonstration of topics presented as noted by education test scores.  Learning Barriers/Preferences:  Learning Barriers/Preferences - 08/02/22 1124       Learning Barriers/Preferences   Learning Barriers None    Learning Preferences None             General Cardiac Education Topics:  AED/CPR: - Group verbal and written instruction with the use of models to demonstrate the basic use of the AED with the basic ABC's of resuscitation.   Anatomy and Cardiac Procedures: - Group verbal and visual presentation and models provide information about basic cardiac anatomy and function. Reviews the testing methods done to diagnose heart disease and the outcomes of the test results. Describes the treatment choices: Medical Management, Angioplasty, or Coronary Bypass Surgery for treating various heart conditions including Myocardial Infarction, Angina, Valve Disease, and Cardiac Arrhythmias.  Written material given at graduation.   Medication Safety: - Group verbal and visual instruction to review commonly prescribed medications for heart and lung disease. Reviews the medication, class of the drug, and side effects. Includes the steps to properly  store meds and maintain the prescription regimen.  Written material given at graduation.   Intimacy: - Group verbal instruction through game format to discuss how heart and lung disease can affect sexual intimacy. Written material given at graduation.. Flowsheet Row Cardiac Rehab from 12/29/2022 in [facility] Cardiac and Pulmonary Rehab  Education need identified 08/09/22       Know Your Numbers and Heart Failure: - Group verbal and visual instruction to discuss disease risk factors for cardiac and pulmonary disease and treatment options.  Reviews associated critical values for Overweight/Obesity, Hypertension, Cholesterol, and Diabetes.  Discusses basics of heart failure: signs/symptoms and treatments.  Introduces Heart Failure Zone  chart for action plan for heart failure.  Written material given at graduation.   Infection Prevention: - Provides verbal and written material to individual with discussion of infection control including proper hand washing and proper equipment cleaning during exercise session. Flowsheet Row Cardiac Rehab from 12/29/2022 in [facility] Cardiac and Pulmonary Rehab  Date 08/02/22  Educator [facility]  Instruction Review Code 1- Verbalizes Understanding       Falls Prevention: - Provides verbal and written material to individual with discussion of falls prevention and safety. Flowsheet Row Cardiac Rehab from 12/29/2022 in [facility] Cardiac and Pulmonary Rehab  Date 08/02/22  Educator [facility]  Instruction Review Code 1- Verbalizes Understanding       Other: -Provides group and verbal instruction on various topics (see comments)   Knowledge Questionnaire Score:  Knowledge Questionnaire Score - 12/20/22 1111       Knowledge Questionnaire Score   Post Score 26/26             Core Components/Risk Factors/Patient Goals at Admission:  Personal Goals and Risk Factors at Admission - 08/09/22 1202       Core Components/Risk Factors/Patient Goals on Admission    Weight Management Yes;Weight Loss;Obesity    Intervention Weight Management: Develop a combined nutrition and exercise program designed to reach desired caloric intake, while maintaining appropriate intake of nutrient and fiber, sodium and fats, and appropriate energy expenditure required for the weight goal.;Weight Management/Obesity: Establish reasonable short term and long term weight goals.;Weight Management: Provide education and appropriate resources to help participant work on and attain dietary goals.;Obesity: Provide education and appropriate resources to help participant work on and attain dietary goals.    Admit Weight 194 lb 12.8 oz (88.4 kg)    Goal Weight: Short Term 190 lb (86.2 kg)    Goal Weight: Long Term 185 lb (83.9 kg)     Expected Outcomes Short Term: Continue to assess and modify interventions until short term weight is achieved;Long Term: Adherence to nutrition and physical activity/exercise program aimed toward attainment of established weight goal;Weight Loss: Understanding of general recommendations for a balanced deficit meal plan, which promotes 1-2 lb weight loss per week and includes a negative energy balance of [phone] kcal/d;Understanding recommendations for meals to include 15-35% energy as protein, 25-35% energy from fat, 35-60% energy from carbohydrates, less than 200mg  of dietary cholesterol, 20-35 gm of total fiber daily;Understanding of distribution of calorie intake throughout the day with the consumption of 4-5 meals/snacks    Hypertension Yes    Intervention Provide education on lifestyle modifcations including regular physical activity/exercise, weight management, moderate sodium restriction and increased consumption of fresh fruit, vegetables, and low fat dairy, alcohol moderation, and smoking cessation.;Monitor prescription use compliance.    Expected Outcomes Short Term: Continued assessment and intervention until BP is < 140/11mm HG in hypertensive participants. < 130/35mm HG  in hypertensive participants with diabetes, heart failure or chronic kidney disease.;Long Term: Maintenance of blood pressure at goal levels.    Lipids Yes    Intervention Provide education and support for participant on nutrition & aerobic/resistive exercise along with prescribed medications to achieve LDL 70mg , HDL >40mg .    Expected Outcomes Short Term: Participant states understanding of desired cholesterol values and is compliant with medications prescribed. Participant is following exercise prescription and nutrition guidelines.;Long Term: Cholesterol controlled with medications as prescribed, with individualized exercise RX and with personalized nutrition plan. Value goals: LDL < 70mg , HDL > 40 mg.              Education:Diabetes - Individual verbal and written instruction to review signs/symptoms of diabetes, desired ranges of glucose level fasting, after meals and with exercise. Acknowledge that pre and post exercise glucose checks will be done for 3 sessions at entry of program.   Core Components/Risk Factors/Patient Goals Review:   Goals and Risk Factor Review     Row Name 08/25/22 1029 09/20/22 1400 10/11/22 1350 11/24/22 1015 12/13/22 1011     Core Components/Risk Factors/Patient Goals Review   Personal Goals Review Weight Management/Obesity;Heart Failure;Hypertension;Lipids Weight Management/Obesity;Heart Failure;Hypertension;Lipids Weight Management/Obesity;Heart Failure;Hypertension;Lipids Weight Management/Obesity;Heart Failure;Hypertension;Lipids Weight Management/Obesity;Hypertension;Lipids   Review Calysta is doing well in rehab. Her weight will do well as long as she is not eating at the beach. Anytime she goes to beach, she notices her weight goes up.  She has not had any heart failure symtptoms.  Her pressures are doing well overall, but as they are working on her medications due to a cough she has noticed that it will go up and down.  She sees the PA again next week for next adjustment. Salima continues to do well. She states she has lost a few pounds but wants to lose more, no weight goal. She does weigh herself daily and is aware to be mindful of any sudden weight gain that could possibly be fluid. She denies heart failure symptoms. She is trying to avoid high saturated dats and fried foods to help with her weight loss. Her cough is gone as her doctor switched her medications around and found out which one was causing the cough. She has a physical next week to review her bloodwork as her doctor states she is anemic and has low sodium. They will discuss possible supplements/next stemps. BP at home is good- she has a log on her phone. They range 300-762U systolic and 63-33L diastolic. Her  doctor swtiched her BP meds 2 weeks ago and is dilligently watching her pressures Amber is doing well in rehab. Her weight is holding steady and she would like to lose.  Her pressures are still a little low for her as she is dizzy in the morning.  She has not had any heart failure symptoms otherwise.  She is feeling better overall and looking forward to retirement in a few days. Latrese is doing well in rehab.  Her weight crept up some while she was out sick as she was not able to exercies.  She has not had heart failure symptoms.  Her pressures are doing well.  She was out sick with COVID and now feeling better. Monc[place]ath is doing well. She has had history of low BP, but hasnt noticed in a while and states it has improved. She does not experience dizziness anymore. She is taking all her medications. BP at rehab is generally 120/60s.  She is trying to lose weight but  has stated around 190 lb. Her goal weight is 150 lb, however, her doctor said to not stress about it. She knows to keep an eye on any abnormal values.   Expected Outcomes Short: Continue to work on weight loss Long: Continue to improve stamina. Short: Talk to doctor about blood work (sodium and iron levels) Long: Continue to manage lifestyle risk factors Short: Continue to work on weight loss long: Conitnue to montior risk factors Short: Continue to work on weight loss Long: Continue to monitor risk factors Short: Continue to monitor BP closely Long: Continue to manage lifestyle risk factors            Core Components/Risk Factors/Patient Goals at Discharge (Final Review):   Goals and Risk Factor Review - 12/13/22 1011       Core Components/Risk Factors/Patient Goals Review   Personal Goals Review Weight Management/Obesity;Hypertension;Lipids    Review Willadean is doing well. She has had history of low BP, but hasnt noticed in a while and states it has improved. She does not experience dizziness anymore. She is taking all her medications. BP  at rehab is generally 120/60s.  She is trying to lose weight but has stated around 190 lb. Her goal weight is 150 lb, however, her doctor said to not stress about it. She knows to keep an eye on any abnormal values.    Expected Outcomes Short: Continue to monitor BP closely Long: Continue to manage lifestyle risk factors             ITP Comments:  ITP Comments     Row Name 08/02/22 1123 08/09/22 1154 08/11/22 0808 08/11/22 1035 09/08/22 1038   ITP Comments Virtual Visit completed. Patient informed on EP and RD appointment and 6 Minute walk test. Patient also informed of patient health questionnaires on My Chart. Patient Verbalizes understanding. Visit diagnosis can be found in [facility] 06/07/2022. Completed 6MWT and gym orientation. Initial ITP created and sent for review to Dr. Emily Filbert, Medical Director. 30 Day review completed. Medical Director ITP review done, changes made as directed, and signed approval by Medical Director.   New to program First full day of exercise!  Patient was oriented to gym and equipment including functions, settings, policies, and procedures.  Patient's individual exercise prescription and treatment plan were reviewed.  All starting workloads were established based on the results of the 6 minute walk test done at initial orientation visit.  The plan for exercise progression was also introduced and progression will be customized based on patient's performance and goals. 30 Day review completed. Medical Director ITP review done, changes made as directed, and signed approval by Medical Director.    [place] Name 10/06/22 1101 11/03/22 1128 11/09/22 1459 11/24/22 1011 12/01/22 1101   ITP Comments 30 Day review completed. Medical Director ITP review done, changes made as directed, and signed approval by Medical Director. 30 Day review completed. Medical Director ITP review done, changes made as directed, and signed approval by Medical Director. Patient called to let us know she is  out with covid and will not return until 1/17. Mariel returned to rehab today. She will be gone again the rest of the month for MD appt and going back to beach. She has been out due to [place]. 30 Day review completed. Medical Director ITP review done, changes made as directed, and signed approval by Medical Director.    [place] Name 12/29/22 1526           ITP Comments 30 day  review completed. ITP sent to Dr. Emily Filbert, Medical Director of Cardiac Rehab. Continue with ITP unless changes are made by physician.                Comments: 30 day review

## 2022-12-29 NOTE — Progress Notes (Signed)
Daily Session Note  Patient Details  Name: Amanda Estrada MRN: [id number] Date of Birth: [birth year] Referring Provider:   Flowsheet Row Cardiac Rehab from 08/09/2022 in [facility] Cardiac and Pulmonary Rehab  Referring Provider Pleas Patricia MD       Encounter Date: 12/29/2022  Check In:  Session Check In - 12/29/22 0953       Check-In   Supervising physician immediately available to respond to emergencies See telemetry face sheet for immediately available ER MD    Location [facility]-Cardiac & Pulmonary Rehab    Staff Present Darlyne Russian, RN, ADN;Joseph Tessie Fass, RCP,RRT,BSRT;Noah Tickle, BS, Exercise Physiologist    Virtual Visit No    Medication changes reported     No    Fall or balance concerns reported    No    Warm-up and Cool-down Performed on first and last piece of equipment    Resistance Training Performed Yes    VAD Patient? No    PAD/SET Patient? No      Pain Assessment   Currently in Pain? No/denies                Social History   Tobacco Use  Smoking Status Never  Smokeless Tobacco Never    Goals Met:  Independence with exercise equipment Exercise tolerated well No report of concerns or symptoms today Strength training completed today  Goals Unmet:  Not Applicable  Comments: Pt able to follow exercise prescription today without complaint.  Will continue to monitor for progression.    Dr. Emily Filbert is Medical Director for [place].  Dr. Ottie Glazier is Medical Director for [facility] Pulmonary Rehabilitation.

## 2022-12-31 ENCOUNTER — Encounter: Payer: BC Managed Care – PPO | Admitting: *Deleted

## 2022-12-31 DIAGNOSIS — Z951 Presence of aortocoronary bypass graft: Secondary | ICD-10-CM

## 2022-12-31 DIAGNOSIS — Z48812 Encounter for surgical aftercare following surgery on the circulatory system: Secondary | ICD-10-CM | POA: Diagnosis not present

## 2022-12-31 NOTE — Progress Notes (Signed)
Daily Session Note  Patient Details  Name: Amanda Estrada MRN: [id number] Date of Birth: [birth year] Referring Provider:   Flowsheet Row Cardiac Rehab from 08/09/2022 in [facility] Cardiac and Pulmonary Rehab  Referring Provider Pleas Patricia MD       Encounter Date: 12/31/2022  Check In:  Session Check In - 12/31/22 1023       Check-In   Supervising physician immediately available to respond to emergencies See telemetry face sheet for immediately available ER MD    Location [facility]-Cardiac & Pulmonary Rehab    Staff Present Alberteen Sam, MA, RCEP, CCRP, CCET;Joseph [place], RCP,RRT,BSRT;Other   Darel Hong, RN BSN   Virtual Visit No    Medication changes reported     No    Fall or balance concerns reported    No    Tobacco Cessation No Change    Warm-up and Cool-down Performed on first and last piece of equipment    Resistance Training Performed Yes    VAD Patient? No    PAD/SET Patient? No      Pain Assessment   Currently in Pain? No/denies                Social History   Tobacco Use  Smoking Status Never  Smokeless Tobacco Never    Goals Met:  Independence with exercise equipment Exercise tolerated well No report of concerns or symptoms today Strength training completed today  Goals Unmet:  Not Applicable  Comments: Pt able to follow exercise prescription today without complaint.  Will continue to monitor for progression.    Dr. Emily Filbert is Medical Director for [place].  Dr. Ottie Glazier is Medical Director for [facility] Pulmonary Rehabilitation.

## 2023-01-03 ENCOUNTER — Encounter: Payer: BC Managed Care – PPO | Admitting: *Deleted

## 2023-01-03 DIAGNOSIS — Z48812 Encounter for surgical aftercare following surgery on the circulatory system: Secondary | ICD-10-CM | POA: Diagnosis not present

## 2023-01-03 DIAGNOSIS — Z951 Presence of aortocoronary bypass graft: Secondary | ICD-10-CM

## 2023-01-03 NOTE — Progress Notes (Signed)
Daily Session Note  Patient Details  Name: Amanda Estrada MRN: [id number] Date of Birth: [birth year] Referring Provider:   Flowsheet Row Cardiac Rehab from 08/09/2022 in [facility] Cardiac and Pulmonary Rehab  Referring Provider Pleas Patricia MD       Encounter Date: 01/03/2023  Check In:  Session Check In - 01/03/23 0952       Check-In   Supervising physician immediately available to respond to emergencies See telemetry face sheet for immediately available ER MD    Location [facility]-Cardiac & Pulmonary Rehab    Staff Present Darlyne Russian, RN, Doyce Para, BS, ACSM CEP, Exercise Physiologist;Noah Tickle, BS, Exercise Physiologist    Virtual Visit No    Medication changes reported     No    Fall or balance concerns reported    No    Warm-up and Cool-down Performed on first and last piece of equipment    Resistance Training Performed Yes    VAD Patient? No    PAD/SET Patient? No      Pain Assessment   Currently in Pain? No/denies                Social History   Tobacco Use  Smoking Status Never  Smokeless Tobacco Never    Goals Met:  Independence with exercise equipment Exercise tolerated well No report of concerns or symptoms today Strength training completed today  Goals Unmet:  Not Applicable  Comments:  Amanda Estrada graduated today from  rehab with 36 sessions completed.  Details of the patient's exercise prescription and what She needs to do in order to continue the prescription and progress were discussed with patient.  Patient was given a copy of prescription and goals.  Patient verbalized understanding.  Amanda Estrada plans to continue to exercise by walking and staff videos.     Dr. Emily Filbert is Medical Director for [place].  Dr. Ottie Glazier is Medical Director for [facility] Pulmonary Rehabilitation.

## 2023-01-03 NOTE — Progress Notes (Signed)
Discharge Summary: Amanda Estrada (DOB: Dec 22, 1960)  Horris Latino graduated today from  rehab with 36 sessions completed.  Details of the patient's exercise prescription and what She needs to do in order to continue the prescription and progress were discussed with patient.  Patient was given a copy of prescription and goals.  Patient verbalized understanding.  [name] plans to continue to exercise by walking and staff videos.   [place] Name 08/09/22 1154 12/20/22 1008       6 Minute Walk   Phase Initial Discharge    Distance 1200 feet 1555 feet    Distance % Change -- 29.6 %    Distance Feet Change -- 355 ft    Walk Time 6 minutes 6 minutes    # of Rest Breaks 0 0    MPH 2.27 2.95    METS 3.21 3.6    RPE 11 13    Perceived Dyspnea  1 0    VO2 Peak 11.24 12.58    Symptoms Yes (comment) No    Comments SOB --    Resting HR 74 bpm 67 bpm    Resting BP 136/74 122/60    Resting Oxygen Saturation  100 % 98 %    Exercise Oxygen Saturation  during 6 min walk 100 % 98 %    Max Ex. HR 96 bpm 99 bpm    Max Ex. BP 174/82 144/66    2 Minute Post BP 142/70 --

## 2023-01-03 NOTE — Progress Notes (Signed)
Cardiac Individual Treatment Plan  Patient Details  Name: Amanda Estrada MRN: [id number] Date of Birth: [birth year] Referring Provider:   Flowsheet Row Cardiac Rehab from 08/09/2022 in [facility] Cardiac and Pulmonary Rehab  Referring Provider Pleas Patricia MD       Initial Encounter Date:  Flowsheet Row Cardiac Rehab from 08/09/2022 in [facility] Cardiac and Pulmonary Rehab  Date 08/09/22       Visit Diagnosis: S/P CABG x 3  Patient's Home Medications on Admission:  Current Outpatient Medications:    albuterol (VENTOLIN HFA) 108 (90 Base) MCG/ACT inhaler, Inhale 2 puffs into the lungs every 4 (four) hours as needed for wheezing or shortness of breath., Disp: 8 g, Rfl: 2   amiodarone (PACERONE) 200 MG tablet, Take by mouth., Disp: , Rfl:    apixaban (ELIQUIS) 5 MG TABS tablet, Take 1 tablet (5 mg total) by mouth 2 (two) times daily., Disp: 60 tablet, Rfl: 11   aspirin EC 81 MG tablet, Take by mouth., Disp: , Rfl:    beclomethasone (QVAR REDIHALER) 80 MCG/ACT inhaler, Inhale 1 puff into the lungs 2 (two) times daily., Disp: 1 each, Rfl: 0   beclomethasone (QVAR) 80 MCG/ACT inhaler, Inhale 1 puff into the lungs in the morning and at bedtime., Disp: 1 each, Rfl: 12   bisoprolol (ZEBETA) 10 MG tablet, Take 1 tablet (10 mg total) by mouth daily., Disp: 90 tablet, Rfl: 3   cloNIDine (CATAPRES) 0.1 MG tablet, TAKE 1 TABLET BY MOUTH EVERYDAY AT BEDTIME, Disp: 90 tablet, Rfl: 1   diltiazem (CARDIZEM CD) 240 MG 24 hr capsule, TAKE 1 CAPSULE BY MOUTH EVERY DAY, Disp: 90 capsule, Rfl: 3   fluticasone (FLONASE) 50 MCG/ACT nasal spray, PLACE 1 SPRAY INTO BOTH NOSTRILS DAILY AS NEEDED FOR ALLERGIES., Disp: 48 mL, Rfl: 3   levocetirizine (XYZAL) 5 MG tablet, Take 5 mg by mouth at bedtime. , Disp: , Rfl:    lisinopril (ZESTRIL) 20 MG tablet, Take 20 mg by mouth daily., Disp: , Rfl:    losartan (COZAAR) 100 MG tablet, Take 100 mg by mouth daily., Disp: , Rfl:    montelukast (SINGULAIR) 10 MG tablet, TAKE 1  TABLET BY MOUTH EVERYDAY AT BEDTIME, Disp: 90 tablet, Rfl: 3   omeprazole (PRILOSEC) 40 MG capsule, Take 1 capsule (40 mg total) by mouth 2 (two) times daily., Disp: 180 capsule, Rfl: 3   rosuvastatin (CRESTOR) 40 MG tablet, Take 40 mg by mouth daily., Disp: , Rfl:    tirzepatide (MOUNJARO) 2.5 MG/0.5ML Pen, Inject 2.5 mg into the skin once a week., Disp: 2 mL, Rfl: 11  Past Medical History: Past Medical History:  Diagnosis Date   Allergy    Anxiety    patient denies   Asthma    Cataract    Dysplastic nevus 08/28/2018   R paraspinal mid to upper back - severe, excision 11/21/2018   Dysplastic nevus 08/28/2018   L lat breast sup - moderate   Dysplastic nevus 07/02/2019   R mid to low back 4.0 cm lat to spine - moderate   Dysplastic nevus 07/02/2019   R inframammary - moderate   GERD (gastroesophageal reflux disease)    Glaucoma    History of echocardiogram    a. 11/2015: EF 55-60%, no RWMA, nl LV diastolic fxn, PASP nl   Hypertension    Obesity    PAF (paroxysmal atrial fibrillation) ([place])    a. CHADS2VASc = 2 (HTN, sex category)   Sinusitis 2011   Recurrent per Dr. Tami Ribas  with [facility]    Tobacco Use: Social History   Tobacco Use  Smoking Status Never  Smokeless Tobacco Never    Labs: Review Flowsheet  More data exists      Latest Ref Rng & Units 10/14/2017 12/01/2018 02/18/2020 05/27/2021 09/17/2022  Labs for ITP Cardiac and Pulmonary Rehab  Cholestrol 0 - 200 mg/dL 155  158  176  160  161   LDL (calc) 0 - 99 mg/dL 48  43  62  60  45   HDL-C >39.00 mg/dL 95.40  104.90  94.50  76.60  97.60   Trlycerides 0.0 - 149.0 mg/dL 62.0  51.0  96.0  117.0  91.0      Exercise Target Goals: Exercise Program Goal: Individual exercise prescription set using results from initial 6 min walk test and THRR while considering  patient's activity barriers and safety.   Exercise Prescription Goal: Initial exercise prescription builds to 30-45 minutes a day of aerobic activity, 2-3  days per week.  Home exercise guidelines will be given to patient during program as part of exercise prescription that the participant will acknowledge.   Education: Aerobic Exercise: - Group verbal and visual presentation on the components of exercise prescription. Introduces F.I.T.T principle from ACSM for exercise prescriptions.  Reviews F.I.T.T. principles of aerobic exercise including progression. Written material given at graduation.   Education: Resistance Exercise: - Group verbal and visual presentation on the components of exercise prescription. Introduces F.I.T.T principle from ACSM for exercise prescriptions  Reviews F.I.T.T. principles of resistance exercise including progression. Written material given at graduation.    Education: Exercise & Equipment Safety: - Individual verbal instruction and demonstration of equipment use and safety with use of the equipment. Flowsheet Row Cardiac Rehab from 12/29/2022 in [facility] Cardiac and Pulmonary Rehab  Date 08/02/22  Educator [facility]  Instruction Review Code 1- Verbalizes Understanding       Education: Exercise Physiology & General Exercise Guidelines: - Group verbal and written instruction with models to review the exercise physiology of the cardiovascular system and associated critical values. Provides general exercise guidelines with specific guidelines to those with heart or lung disease.    Education: Flexibility, Balance, Mind/Body Relaxation: - Group verbal and visual presentation with interactive activity on the components of exercise prescription. Introduces F.I.T.T principle from ACSM for exercise prescriptions. Reviews F.I.T.T. principles of flexibility and balance exercise training including progression. Also discusses the mind body connection.  Reviews various relaxation techniques to help reduce and manage stress (i.e. Deep breathing, progressive muscle relaxation, and visualization). Balance handout provided to take home. Written  material given at graduation.   Activity Barriers & Risk Stratification:  Activity Barriers & Cardiac Risk Stratification - 08/09/22 1155       Activity Barriers & Cardiac Risk Stratification   Activity Barriers Deconditioning;Muscular Weakness;Balance Concerns    Cardiac Risk Stratification High             6 Minute Walk:  6 Minute Walk     Row Name 08/09/22 1154 12/20/22 1008       6 Minute Walk   Phase Initial Discharge    Distance 1200 feet 1555 feet    Distance % Change -- 29.6 %    Distance Feet Change -- 355 ft    Walk Time 6 minutes 6 minutes    # of Rest Breaks 0 0    MPH 2.27 2.95    METS 3.21 3.6    RPE 11 13    Perceived Dyspnea  1 0    VO2 Peak 11.24 12.58    Symptoms Yes (comment) No    Comments SOB --    Resting HR 74 bpm 67 bpm    Resting BP 136/74 122/60    Resting Oxygen Saturation  100 % 98 %    Exercise Oxygen Saturation  during 6 min walk 100 % 98 %    Max Ex. HR 96 bpm 99 bpm    Max Ex. BP 174/82 144/66    2 Minute Post BP 142/70 --             Oxygen Initial Assessment:   Oxygen Re-Evaluation:   Oxygen Discharge (Final Oxygen Re-Evaluation):   Initial Exercise Prescription:  Initial Exercise Prescription - 08/09/22 1100       Date of Initial Exercise RX and Referring Provider   Date 08/09/22    Referring Provider Pleas Patricia MD      Oxygen   Maintain Oxygen Saturation 88% or higher      Treadmill   MPH 2.2    Grade 1    Minutes 15    METs 2.99      Recumbant Elliptical   Level 1    RPM 50    Minutes 15    METs 3      REL-XR   Level 3    Speed 50    Minutes 15    METs 3      Prescription Details   Frequency (times per week) 3    Duration Progress to 30 minutes of continuous aerobic without signs/symptoms of physical distress      Intensity   THRR 40-80% of Max Heartrate 109-142    Ratings of Perceived Exertion 11-13    Perceived Dyspnea 0-4      Progression   Progression Continue to progress  workloads to maintain intensity without signs/symptoms of physical distress.      Resistance Training   Training Prescription Yes    Weight 4 lb    Reps 10-15             Perform Capillary Blood Glucose checks as needed.  Exercise Prescription Changes:   Exercise Prescription Changes     Row Name 08/09/22 1100 08/17/22 1500 08/25/22 1000 08/30/22 1000 09/15/22 1600     Response to Exercise   Blood Pressure (Admit) 136/74 128/62 -- 118/64 122/60   Blood Pressure (Exercise) 174/82 142/64 -- -- 164/70   Blood Pressure (Exit) 142/70 122/62 -- 126/60 122/78   Heart Rate (Admit) 75 bpm 77 bpm -- 90 bpm 88 bpm   Heart Rate (Exercise) 96 bpm 104 bpm -- 122 bpm 124 bpm   Heart Rate (Exit) 78 bpm 75 bpm -- 105 bpm 90 bpm   Oxygen Saturation (Admit) 100 % -- -- -- --   Oxygen Saturation (Exercise) 100 % -- -- -- --   Rating of Perceived Exertion (Exercise) 11 12 -- 13 13   Perceived Dyspnea (Exercise) 1 -- -- -- --   Symptoms SOB none -- none none   Comments walk test results -- -- -- --   Duration -- Continue with 30 min of aerobic exercise without signs/symptoms of physical distress. -- Continue with 30 min of aerobic exercise without signs/symptoms of physical distress. Continue with 30 min of aerobic exercise without signs/symptoms of physical distress.   Intensity -- THRR unchanged -- THRR unchanged THRR unchanged     Progression   Progression -- Continue to progress workloads to maintain  intensity without signs/symptoms of physical distress. -- Continue to progress workloads to maintain intensity without signs/symptoms of physical distress. Continue to progress workloads to maintain intensity without signs/symptoms of physical distress.   Average METs -- 2.65 -- 2.88 2.86     Resistance Training   Training Prescription -- Yes -- Yes Yes   Weight -- 4 lb -- 4 lb 4 lb   Reps -- 10-15 -- 10-15 10-15     Interval Training   Interval Training -- No -- No No     Treadmill   MPH  -- 2.2 -- 2.8 2.8   Grade -- 1 -- 1 1   Minutes -- 15 -- 15 15   METs -- 2.99 -- 3.53 3.53     Recumbant Elliptical   Level -- 2 -- 1.6 2   Minutes -- 15 -- 15 15   METs -- 2.1 -- 1.7 2     REL-XR   Level -- 4 -- 5 2   Minutes -- 15 -- 15 15   METs -- 2.5 -- 3 --     Home Exercise Plan   Plans to continue exercise at -- -- Home (comment)  walking, staff videos Home (comment)  walking, staff videos Home (comment)  walking, staff videos   Frequency -- -- Add 2 additional days to program exercise sessions. Add 2 additional days to program exercise sessions. Add 2 additional days to program exercise sessions.   Initial Home Exercises Provided -- -- 08/25/22 08/25/22 08/25/22     Oxygen   Maintain Oxygen Saturation -- 88% or higher -- 88% or higher 88% or higher    Row Name 09/28/22 1400 10/14/22 1500 10/26/22 1400 11/11/22 0900 11/25/22 1400     Response to Exercise   Blood Pressure (Admit) 128/62 118/60 130/66 118/70 126/64   Blood Pressure (Exercise) 148/58 -- -- -- --   Blood Pressure (Exit) 128/64 132/68 128/60 126/70 112/64   Heart Rate (Admit) 75 bpm 62 bpm 65 bpm 61 bpm 73 bpm   Heart Rate (Exercise) 109 bpm 98 bpm 89 bpm 89 bpm 86 bpm   Heart Rate (Exit) 84 bpm 69 bpm 66 bpm 69 bpm 64 bpm   Rating of Perceived Exertion (Exercise) 13 13 13 13 13    Symptoms none none none none none   Duration Continue with 30 min of aerobic exercise without signs/symptoms of physical distress. Continue with 30 min of aerobic exercise without signs/symptoms of physical distress. Continue with 30 min of aerobic exercise without signs/symptoms of physical distress. Continue with 30 min of aerobic exercise without signs/symptoms of physical distress. Continue with 30 min of aerobic exercise without signs/symptoms of physical distress.   Intensity THRR unchanged THRR unchanged THRR unchanged THRR unchanged THRR unchanged     Progression   Progression Continue to progress workloads to maintain  intensity without signs/symptoms of physical distress. Continue to progress workloads to maintain intensity without signs/symptoms of physical distress. Continue to progress workloads to maintain intensity without signs/symptoms of physical distress. Continue to progress workloads to maintain intensity without signs/symptoms of physical distress. Continue to progress workloads to maintain intensity without signs/symptoms of physical distress.   Average METs 2.98 2.7 0.36 2.78 2.45     Resistance Training   Training Prescription Yes Yes Yes Yes Yes   Weight 5 lb 5 lb 5 lb 5 lb 5 lb   Reps 10-15 10-15 10-15 10-15 10-15     Interval Training   Interval Training -- No  No No No     Treadmill   MPH 3 2.7 2.7 2.8 2.8   Grade 1.5 1 2 2 2    Minutes 15 15 15 15 15    METs 3.92 3.44 3.81 3.91 3.91     NuStep   Level -- -- 4 -- --   Minutes -- -- 15 -- --   METs -- -- 4 -- --     Recumbant Elliptical   Level 1 1.5 1 1  --   Minutes 15 15 15 15  --   METs 1.6 1.8 -- 1.8 --     REL-XR   Level 3 2 3 3  --   Minutes 15 15 15 15  --   METs 2.4 2.1 -- -- --     T5 Nustep   Level 1 -- -- -- --   Minutes 15 -- -- -- --   METs 1.8 -- -- -- --     Home Exercise Plan   Plans to continue exercise at Home (comment)  walking, staff videos Home (comment)  walking, staff videos Home (comment)  walking, staff videos Home (comment)  walking, staff videos Home (comment)  walking, staff videos   Frequency Add 2 additional days to program exercise sessions. Add 2 additional days to program exercise sessions. Add 2 additional days to program exercise sessions. Add 2 additional days to program exercise sessions. Add 2 additional days to program exercise sessions.   Initial Home Exercises Provided 08/25/22 08/25/22 08/25/22 08/25/22 08/25/22     Oxygen   Maintain Oxygen Saturation 88% or higher 88% or higher 88% or higher 88% or higher 88% or higher    Row Name 12/22/22 1300             Response to  Exercise   Blood Pressure (Admit) 122/62       Blood Pressure (Exit) 122/62       Heart Rate (Admit) 71 bpm       Heart Rate (Exercise) 89 bpm       Heart Rate (Exit) 74 bpm       Rating of Perceived Exertion (Exercise) 13       Symptoms none       Duration Continue with 30 min of aerobic exercise without signs/symptoms of physical distress.       Intensity THRR unchanged         Progression   Progression Continue to progress workloads to maintain intensity without signs/symptoms of physical distress.       Average METs 3.16         Resistance Training   Training Prescription Yes       Weight 5 lb       Reps 10-15         Interval Training   Interval Training No         Treadmill   MPH 2.7       Grade 2       Minutes 15       METs 3.81         Recumbant Bike   Level 3       Watts 25       Minutes 15       METs 2.89         Recumbant Elliptical   Level 1.2       Minutes 15       METs 1.7         REL-XR   Level 3  Minutes 15         Home Exercise Plan   Plans to continue exercise at Home (comment)  walking, staff videos       Frequency Add 2 additional days to program exercise sessions.       Initial Home Exercises Provided 08/25/22         Oxygen   Maintain Oxygen Saturation 88% or higher                Exercise Comments:   Exercise Comments     Row Name 08/11/22 1035           Exercise Comments First full day of exercise!  Patient was oriented to gym and equipment including functions, settings, policies, and procedures.  Patient's individual exercise prescription and treatment plan were reviewed.  All starting workloads were established based on the results of the 6 minute walk test done at initial orientation visit.  The plan for exercise progression was also introduced and progression will be customized based on patient's performance and goals.                Exercise Goals and Review:   Exercise Goals     Row Name 08/09/22 1200              Exercise Goals   Increase Physical Activity Yes       Intervention Provide advice, education, support and counseling about physical activity/exercise needs.;Develop an individualized exercise prescription for aerobic and resistive training based on initial evaluation findings, risk stratification, comorbidities and participant's personal goals.       Expected Outcomes Short Term: Attend rehab on a regular basis to increase amount of physical activity.;Long Term: Add in home exercise to make exercise part of routine and to increase amount of physical activity.;Long Term: Exercising regularly at least 3-5 days a week.       Increase Strength and Stamina Yes       Intervention Provide advice, education, support and counseling about physical activity/exercise needs.;Develop an individualized exercise prescription for aerobic and resistive training based on initial evaluation findings, risk stratification, comorbidities and participant's personal goals.       Expected Outcomes Short Term: Increase workloads from initial exercise prescription for resistance, speed, and METs.;Short Term: Perform resistance training exercises routinely during rehab and add in resistance training at home;Long Term: Improve cardiorespiratory fitness, muscular endurance and strength as measured by increased METs and functional capacity (6MWT)       Able to understand and use rate of perceived exertion (RPE) scale Yes       Intervention Provide education and explanation on how to use RPE scale       Expected Outcomes Short Term: Able to use RPE daily in rehab to express subjective intensity level;Long Term:  Able to use RPE to guide intensity level when exercising independently       Able to understand and use Dyspnea scale Yes       Intervention Provide education and explanation on how to use Dyspnea scale       Expected Outcomes Short Term: Able to use Dyspnea scale daily in rehab to express subjective sense of  shortness of breath during exertion;Long Term: Able to use Dyspnea scale to guide intensity level when exercising independently       Knowledge and understanding of Target Heart Rate Range (THRR) Yes       Intervention Provide education and explanation of THRR including how the numbers were predicted and  where they are located for reference       Expected Outcomes Short Term: Able to state/look up THRR;Short Term: Able to use daily as guideline for intensity in rehab;Long Term: Able to use THRR to govern intensity when exercising independently       Able to check pulse independently Yes       Intervention Provide education and demonstration on how to check pulse in carotid and radial arteries.;Review the importance of being able to check your own pulse for safety during independent exercise       Expected Outcomes Short Term: Able to explain why pulse checking is important during independent exercise;Long Term: Able to check pulse independently and accurately       Understanding of Exercise Prescription Yes       Intervention Provide education, explanation, and written materials on patient's individual exercise prescription       Expected Outcomes Short Term: Able to explain program exercise prescription;Long Term: Able to explain home exercise prescription to exercise independently                Exercise Goals Re-Evaluation :  Exercise Goals Re-Evaluation     Row Name 08/11/22 1035 08/17/22 1508 08/25/22 1034 08/30/22 1052 09/15/22 1618     Exercise Goal Re-Evaluation   Exercise Goals Review Able to understand and use rate of perceived exertion (RPE) scale;Able to understand and use Dyspnea scale;Knowledge and understanding of Target Heart Rate Range (THRR);Understanding of Exercise Prescription Understanding of Exercise Prescription;Increase Physical Activity;Increase Strength and Stamina Understanding of Exercise Prescription;Increase Physical Activity;Increase Strength and Stamina;Able to  understand and use rate of perceived exertion (RPE) scale;Able to understand and use Dyspnea scale;Able to check pulse independently;Knowledge and understanding of Target Heart Rate Range (THRR) Increase Physical Activity;Increase Strength and Stamina;Understanding of Exercise Prescription Increase Physical Activity;Increase Strength and Stamina;Understanding of Exercise Prescription   Comments Reviewed RPE and dyspnea scales, THR and program prescription with pt today.  Pt voiced understanding and was given a copy of goals to take home. Leshawn is off to a good start in rehab. She recently improved her overall average MET level to 2.65 METs. She also did well on the treadmill at a speed of 2.2 mph and an incline of 1%. She tolerated 4 lb hand weights for resistance training as well. We will continue to monitor her progress in the program. Reviewed home exercise with pt today.  Pt plans to walk at home and at beach for exercise.  Reviewed THR, pulse, RPE, sign and symptoms, pulse oximetery and when to call 911 or MD.  Also discussed weather considerations and indoor options.  Pt voiced understanding. Trent is doing well for the first couple weeks she has been here for rehab. She has increased her treadmill speed to a 2.8 mph with a 1% incline.  She is also already up to level 5 on the XR using 4 lb handweights. We will continue to monitor as she progresses in the program. Jasline continues to do well in rehab. Her MET level has stayed consistently above 2.8 METs. She also has continued to walk on the treadmill at a speed of 2.8 mph and an incline of 1%. We will continue to monitor her progress in the program.   Expected Outcomes Short: Use RPE daily to regulate intensity. Long: Follow program prescription in THR. Short: Continue to follow current exercise prescription. Long: Continue to increase strength and stamina. Short: Continue to walk on off days Long: Continue to improve stamina Short:  Continue to increase  worklod on treadmill Long: Continue to increase overall MET level Short: Continue to increase workloads as tolerated. Long: Continue to increase strength and stamina.    [place] Name 09/20/22 1355 09/28/22 1455 10/11/22 1343 10/14/22 1504 10/26/22 1440     Exercise Goal Re-Evaluation   Exercise Goals Review Increase Physical Activity;Increase Strength and Stamina;Understanding of Exercise Prescription Increase Physical Activity;Increase Strength and Stamina;Understanding of Exercise Prescription Increase Physical Activity;Increase Strength and Stamina;Understanding of Exercise Prescription Increase Physical Activity;Increase Strength and Stamina;Understanding of Exercise Prescription Increase Physical Activity;Increase Strength and Stamina;Understanding of Exercise Prescription   Comments Rhianon is doing well with her exercise at home. She is walking outdoor for about 20-30 minutes. Encouraged to reach the minimum 30 minutes at once. She goes to the beach once per month and walks there as well. We reiterated the weather conditions now that it is getting colder out and to stay indoors when it gets colder out. She does check her HR with her watch and pulse ox but says it doesn't get up over 100 bpm. She doesn't normally hit her THR but is aware most likely from her medications. Falon continues to do well in rehab.  She has increased to working 3 mph/1% incline on the treadmill! She is working at level 3 on the XR and could benefit from increasing her levels on both the T5 Nustep and REL. She is now using 5 lb handweights for resistance training as well. Will continue to monitor. Debbe is doing well in rehab. She is staying busy at home and staying active.  She is is getting in some walking, but not enough exercise.  She does note that her stamina is improving overall. Tsuruko continues to do well in rehab. She has consistently worked at an average of 2.7 METs. She also has done well on the XR at level 2 and the REL  at level 1.5. We will continue to monitor her progress in the program. We will continue to monitor her progress in the program. Melaney continues to do well. She is not quite hitting her THR but her RPEs are staying in appropriate range. Her treadmill workloads vary each session and will remind patient to progressively increase her levels. She did work up to 4 METs on the T4 Nustep which she has never done before. We will continue to monitor.   Expected Outcomes Short: Continue to hit 30 minutes with walking at home Long: Continue to exercise independently Short: Increase level on REL and T5 Long: Continue to increase overall MET level Short: Make time to exercise Long; Conintue to improve stamina Short: Stay consistent with workload on the treadmill. Long: Continue to increase strength and stamina. Short: Continue to increase level on T4 Nustep Long: Continue to increase overall MET level    Row Name 11/11/22 0945 11/24/22 1010 11/25/22 1437 12/09/22 1501 12/13/22 1007     Exercise Goal Re-Evaluation   Exercise Goals Review Increase Physical Activity;Increase Strength and Stamina;Understanding of Exercise Prescription Increase Physical Activity;Increase Strength and Stamina;Understanding of Exercise Prescription Increase Physical Activity;Increase Strength and Stamina;Understanding of Exercise Prescription -- Increase Physical Activity;Increase Strength and Stamina;Understanding of Exercise Prescription   Comments Temica has been doing well in rehab. She was able to improve her speed on the treadmill back up to 2.8 mph while maintaining a 2% incline. She also has stayed consistent with her workloads on both the XR at level 3 and the recumbent elliptical at level 1. We will continue to monitor her progress.  Brylea returned to rehab today.  She will be gone again the rest of the month for MD appt and going back to beach.  She has been out due to [place].  She hopes that now that she is feeling better she can get  back to exercise routinely again.  She is planning to finish up rehab in February as she does not currently have anything scheduled at this time. Makina returned to rehab after being out due to covid. She worked at her exercise prescription that she did prior to getting sick. She eased back into everything with appropriate RPEs. We will continue to monitor once she is back into the routine. Valeria has not attended rehab since 11/24/2022, due to her being out of town. We will continue to monitor her progress when she returns to regular attendance. Mackie continues to walk indoors and outdoors for her home exercise. She is  going to [facility] to walk as well which she goes to once per month. She is checking her HR but continues to not go past 100 bpm. She understands this is from her medications.   Expected Outcomes Short: Continue to increase workload on REL. Long: Continue to improve strength and stamina. Short; Return to regular exercise and attend rehab regularly Long: Continue to improve stamina again Short: Continue rehab with good attendance Long: Continue to improve overall MET level Short: Continue rehab with good attendance. Long: Continue to improve strength and stamina. Short: Continue walking at home Long: Continue to exercise independently    [place] Name 12/22/22 1401             Exercise Goal Re-Evaluation   Exercise Goals Review Increase Physical Activity;Increase Strength and Stamina;Understanding of Exercise Prescription       Comments Bryanah continues to do well in rehab. She completed her post 6MWT and improved by almost 30%!  Her RPEs are staying appropriate. She has been consistently at 2% incline on the treadmill and will encourage her to increase that over time. We will continue to monitor. She will be graduating in the next couple of weeks.       Expected Outcomes Short: Graduate Long: Continue to increase overall MET level                Discharge Exercise Prescription  (Final Exercise Prescription Changes):  Exercise Prescription Changes - 12/22/22 1300       Response to Exercise   Blood Pressure (Admit) 122/62    Blood Pressure (Exit) 122/62    Heart Rate (Admit) 71 bpm    Heart Rate (Exercise) 89 bpm    Heart Rate (Exit) 74 bpm    Rating of Perceived Exertion (Exercise) 13    Symptoms none    Duration Continue with 30 min of aerobic exercise without signs/symptoms of physical distress.    Intensity THRR unchanged      Progression   Progression Continue to progress workloads to maintain intensity without signs/symptoms of physical distress.    Average METs 3.16      Resistance Training   Training Prescription Yes    Weight 5 lb    Reps 10-15      Interval Training   Interval Training No      Treadmill   MPH 2.7    Grade 2    Minutes 15    METs 3.81      Recumbant Bike   Level 3    Watts 25    Minutes 15  METs 2.89      Recumbant Elliptical   Level 1.2    Minutes 15    METs 1.7      REL-XR   Level 3    Minutes 15      Home Exercise Plan   Plans to continue exercise at Home (comment)   walking, staff videos   Frequency Add 2 additional days to program exercise sessions.    Initial Home Exercises Provided 08/25/22      Oxygen   Maintain Oxygen Saturation 88% or higher             Nutrition:  Target Goals: Understanding of nutrition guidelines, daily intake of sodium 1500mg , cholesterol 200mg , calories 30% from fat and 7% or less from saturated fats, daily to have 5 or more servings of fruits and vegetables.  Education: All About Nutrition: -Group instruction provided by verbal, written material, interactive activities, discussions, models, and posters to present general guidelines for heart healthy nutrition including fat, fiber, MyPlate, the role of sodium in heart healthy nutrition, utilization of the nutrition label, and utilization of this knowledge for meal planning. Follow up email sent as well. Written  material given at graduation. Flowsheet Row Cardiac Rehab from 12/29/2022 in [facility] Cardiac and Pulmonary Rehab  Education need identified 08/09/22       Biometrics:  Pre Biometrics - 08/09/22 1200       Pre Biometrics   Height 5' 5.5" (1.664 m)    Weight 194 lb 12.8 oz (88.4 kg)    Waist Circumference 38.5 inches    Hip Circumference 43.5 inches    Waist to Hip Ratio 0.89 %    BMI (Calculated) 31.91    Single Leg Stand 2.7 seconds             Post Biometrics - 12/20/22 1011        Post  Biometrics   Height 5' 5.5" (1.664 m)    Weight 194 lb 4.8 oz (88.1 kg)    Waist Circumference 41 inches    Hip Circumference 43.5 inches    Waist to Hip Ratio 0.94 %    BMI (Calculated) 31.83    Single Leg Stand 9.1 seconds             Nutrition Therapy Plan and Nutrition Goals:  Nutrition Therapy & Goals - 08/09/22 1123       Nutrition Therapy   Diet Heart healthy, low Na    Drug/Food Interactions Statins/Certain Fruits    Protein (specify units) 70-75g    Fiber 25 grams    Whole Grain Foods 3 servings    Saturated Fats 14 max. grams    Fruits and Vegetables 8 servings/day      Personal Nutrition Goals   Nutrition Goal ST: LT:    Comments 62 y.o. F admitted to cardiac rehab s/p CABG x3. PMHx includes CAD, hypercholesterolemia, HTN, paroxysmal a.fib. Relevant medications includes fluticasone, omeprazole, crestor. She has cut back on her portion sizes. L: leftovers (ex: chicken salad and some pasta salad) D: example steak tonight with potato and salad. Drinks: gatorade zero, sparkling, water. Javaeh reports using olive oil and avocado oils with cooking, however, she will like to use butter, milk, and cheese with cooking - discussed some options to lower amount of high fat dairy with cooking and encouraged to choose recipes with lower amounts of dairy at least 2-3 times per week. She reports that her lab values for sodium have been low and her MD recommended  to include sodium in  her diet. She checks her BP regularly and it has been normal to high; sugested limiting Na to 2300mg  at the higher end to balance lower sodium numbers and elevated BP - informed her that we may need to make adjustments to this recommendation depeing on her lab and BP numbers moving foward. She reports liking pasta - encouaged her that she can still have pasta, but add lean protein and non-starchy vegetables so it is more balances and CHO do not take up most of the plate. Since she has decreased her portion sizes as well as starchy vegetables, discussed how heart healthy changes like increased non-starchy vegetables as a portion of the plate may cause her to be hungrier soon after eating and that healthy snacking may help to control hunger. Discussed heart healthy eating and MyPlate. Tiffony feels that she has the tools she needs to achieve her goals with no major barriers reported.      Intervention Plan   Intervention Prescribe, educate and counsel regarding individualized specific dietary modifications aiming towards targeted core components such as weight, hypertension, lipid management, diabetes, heart failure and other comorbidities.;Nutrition handout(s) given to patient.    Expected Outcomes Short Term Goal: Understand basic principles of dietary content, such as calories, fat, sodium, cholesterol and nutrients.;Short Term Goal: A plan has been developed with personal nutrition goals set during dietitian appointment.;Long Term Goal: Adherence to prescribed nutrition plan.             Nutrition Assessments:  MEDIFICTS Score Key: ?70 Need to make dietary changes  40-70 Heart Healthy Diet ? 40 Therapeutic Level Cholesterol Diet  Flowsheet Row Cardiac Rehab from 12/20/2022 in [facility] Cardiac and Pulmonary Rehab  Picture Your Plate Total Score on Discharge 63      Picture Your Plate Scores: <57 Unhealthy dietary pattern with much room for improvement. 41-50 Dietary pattern unlikely to meet  recommendations for good health and room for improvement. 51-60 More healthful dietary pattern, with some room for improvement.  >60 Healthy dietary pattern, although there may be some specific behaviors that could be improved.    Nutrition Goals Re-Evaluation:  Nutrition Goals Re-Evaluation     Row Name 08/25/22 1026 09/20/22 1358 10/11/22 1347 11/24/22 1014 12/13/22 1014     Goals   Nutrition Goal Short Term Goal: Understand basic principles of dietary content, such as calories, fat, sodium, cholesterol and nutrients.; Short Term Goal: A plan has been developed with personal nutrition goals set during dietitian appointment.; Long Term Goal: Adherence to prescribed nutrition plan. Short Term Goal: Understand basic principles of dietary content, such as calories, fat, sodium, cholesterol and nutrients.; Short Term Goal: A plan has been developed with personal nutrition goals set during dietitian appointment.; Long Term Goal: Adherence to prescribed nutrition plan. Short: Talk to doctor about amount of sodium intake Long: Continue to eat heart healthy and follow RD recommendations Short: Continue watch sodium with doctor Long: Continue to eat heart healthy Short: Continue watch sodium with doctor Long: Continue to eat heart healthy   Comment Ezri is doing well with her diet.  She has gotten better about not snacking after dinner. She is being more diligent about watching her sodium and reading food labels. She is still doing well with her portion control as she and her husband share meals to help. She is still getting more variety and trying to add in more vegetables to meals too. She has started to use leaner proteins as well Ahlia states  she has not made any huge changes, however, she is avoiding fried foods and higher saturated foods. Her doctor told her from her recent bloodwork that her sodium is low and to eat more in her diet. Encouraged her to ask how much she should have to avoid an excess  amount while managing her heart failure at the same time. She has her physical next week to review her bloodwork Oceane is doing well in rehab.  She is working on her diet.  She had a hot dog today but planning on a salad next week.  Her sodium is still running low.  She is drinking 1/2 gatorade. We talked about mixing her gatorade with water to help add more too.  She is aiming to get in vareity in her diet. Priscillia is doing well in rehab.  She has gotten back on track for her diet.  She is watching her sodium levels still and drinking lots of water.  She feels that she has a good handle on her diet. Danaka discussed with her doctor on sodium levels and was told to not worry about specific mg on sodium as it has been too low. She is  drinking more water but not in excess. Her doctor just told her to be aware of symptoms if she starts to not feel right. Her and her husband share a lot of meals for portion control. She eats primarily chicken, tuna, for protein. She eats filet for limited time and only eats about 3 oz at one time.   Expected Outcome Short: Continue to add in vegetables Long: Conitnue to focus on heart healhty eating. Short: Talk to doctor about amount of sodium intake Long: Continue to eat heart healthy and follow RD recommendations Short: Continue watch sodium with doctor Long: Continue to eat heart healthy Short: Continue to add variety back in Long: Continue to focus on heart healthy eating Short: Continue to watch sodium and monitor symptoms Long: Continue to eat heart friendly diets            Nutrition Goals Discharge (Final Nutrition Goals Re-Evaluation):  Nutrition Goals Re-Evaluation - 12/13/22 1014       Goals   Nutrition Goal Short: Continue watch sodium with doctor Long: Continue to eat heart healthy    Comment Dessirae discussed with her doctor on sodium levels and was told to not worry about specific mg on sodium as it has been too low. She is  drinking more water but not in  excess. Her doctor just told her to be aware of symptoms if she starts to not feel right. Her and her husband share a lot of meals for portion control. She eats primarily chicken, tuna, for protein. She eats filet for limited time and only eats about 3 oz at one time.    Expected Outcome Short: Continue to watch sodium and monitor symptoms Long: Continue to eat heart friendly diets             Psychosocial: Target Goals: Acknowledge presence or absence of significant depression and/or stress, maximize coping skills, provide positive support system. Participant is able to verbalize types and ability to use techniques and skills needed for reducing stress and depression.   Education: Stress, Anxiety, and Depression - Group verbal and visual presentation to define topics covered.  Reviews how body is impacted by stress, anxiety, and depression.  Also discusses healthy ways to reduce stress and to treat/manage anxiety and depression.  Written material given at graduation. Flowsheet Row  Cardiac Rehab from 12/29/2022 in [facility] Cardiac and Pulmonary Rehab  Education need identified 08/09/22       Education: Sleep Hygiene -Provides group verbal and written instruction about how sleep can affect your health.  Define sleep hygiene, discuss sleep cycles and impact of sleep habits. Review good sleep hygiene tips.    Initial Review & Psychosocial Screening:  Initial Psych Review & Screening - 08/02/22 1125       Initial Review   Current issues with None Identified      Family Dynamics   Good Support System? Yes    Comments Starletta can look to her spouse family and freinds for support. She feels good about her health and is ready to make changes.      Barriers   Psychosocial barriers to participate in program The patient should benefit from training in stress management and relaxation.;There are no identifiable barriers or psychosocial needs.      Screening Interventions   Interventions  Encouraged to exercise;To provide support and resources with identified psychosocial needs;Provide feedback about the scores to participant    Expected Outcomes Short Term goal: Utilizing psychosocial counselor, staff and physician to assist with identification of specific Stressors or current issues interfering with healing process. Setting desired goal for each stressor or current issue identified.;Long Term Goal: Stressors or current issues are controlled or eliminated.;Short Term goal: Identification and review with participant of any Quality of Life or Depression concerns found by scoring the questionnaire.;Long Term goal: The participant improves quality of Life and PHQ9 Scores as seen by post scores and/or verbalization of changes             Quality of Life Scores:   Quality of Life - 12/20/22 1112       Quality of Life   Select Quality of Life      Quality of Life Scores   Health/Function Pre 26 %    Health/Function Post 25.6 %    Health/Function % Change -1.54 %    Socioeconomic Pre 23.75 %    Socioeconomic Post 30 %    Socioeconomic % Change  26.32 %    Psych/Spiritual Pre 30 %    Psych/Spiritual Post 27.43 %    Psych/Spiritual % Change -8.57 %    Family Pre 28.8 %    Family Post 28.8 %    Family % Change 0 %    GLOBAL Pre 26.69 %    GLOBAL Post 27.19 %    GLOBAL % Change 1.87 %            Scores of 19 and below usually indicate a poorer quality of life in these areas.  A difference of  2-3 points is a clinically meaningful difference.  A difference of 2-3 points in the total score of the Quality of Life Index has been associated with significant improvement in overall quality of life, self-image, physical symptoms, and general health in studies assessing change in quality of life.  PHQ-9: Review Flowsheet  More data exists      12/20/2022 08/09/2022 06/26/2021 02/21/2020 12/05/2018  Depression screen PHQ 2/9  Decreased Interest 0 0 0 0 0  Down, Depressed,  Hopeless 0 0 0 0 0  PHQ - 2 Score 0 0 0 0 0  Altered sleeping 0 0 - - -  Tired, decreased energy 0 0 - - -  Change in appetite 0 0 - - -  Feeling bad or failure about yourself  0 0 - - -  Trouble concentrating 0 0 - - -  Moving slowly or fidgety/restless 0 0 - - -  Suicidal thoughts 0 0 - - -  PHQ-9 Score 0 0 - - -  Difficult doing work/chores Not difficult at all Not difficult at all - - -   Interpretation of Total Score  Total Score Depression Severity:  1-4 = Minimal depression, 5-9 = Mild depression, 10-14 = Moderate depression, 15-19 = Moderately severe depression, 20-27 = Severe depression   Psychosocial Evaluation and Intervention:  Psychosocial Evaluation - 08/02/22 1127       Psychosocial Evaluation & Interventions   Interventions Encouraged to exercise with the program and follow exercise prescription;Relaxation education;Stress management education    Comments Jorja can look to her spouse family and freinds for support. She feels good about her health and is ready to make changes.    Expected Outcomes Short: Start HeartTrack to help with mood. Long: Maintain a healthy mental state    Continue Psychosocial Services  Follow up required by staff             Psychosocial Re-Evaluation:  Psychosocial Re-Evaluation     [place] Name 08/25/22 1024 09/20/22 1406 10/11/22 1345 11/24/22 1012 12/13/22 1009     Psychosocial Re-Evaluation   Current issues with Current Stress Concerns Current Stress Concerns Current Stress Concerns;None Identified Current Stress Concerns;None Identified None Identified   Comments Rose is doing well in rehab. She has started back to work.  She is working from home so that is helping. She did need to switch her Mondays to an afternoon appt due to a work conference call.  She is sleeping until 330-4am and then wakes, unable to get back to sleep.  Her legs from surgery are painful and she is also waking with coughing and they are going to try to change  something else will help. She seeing PA next week for that. Donzella is doing well mentally. 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 She may have a retirement party.  She cuts her phone off to dissociate away from work. She goes to the beach once per month which really helps her get her mind off things. Her birthday was a couple days ago and went to [facility] to celebrate! Her cough is completely gone as her doctor pinpointed which exact medication was causing that side effect. She feels much better. She did not sleep well last night but not normally it is not a problem. She'll continue to watch it and speak up if it becomes a problem. Salina is doing well in rehab. She does not claim to have any stressors at this time.  She is counting down to retirement.  14 days left!  She is just letting things go at work and not worring about it.  She went to beach for Thanksgiving and enjoyed it. She plans to go back next week again.  She continues to sleep well. Dae returned to rehab today. She will be gone again the rest of the month for MD appt and going back to beach. She has been out due to [place].  She is starting to feel better again.  She hopes to get back to doing what she likes now that she can leave the house again.  She is sleeping well normally, but now the predinisone is keeping her awake some at night.  She is enjoying retirement. Lylliana is recovering from having  covid but is feeling better overall. She just retired in december! She feels more relaxed, but still dealing with paperwork.  She is not too free with time yet as she spends a lot of time for  her mom and  MIL taking care of them. She has good support from her spouse! She denies any other concerns at this time.   Expected Outcomes Short; Figure which med is causing her coughing Long: Continue to cope with return to work. Short: Retire in December! Long: Continue to maintain  positive attitude Short: Countdown to retirement Long: conitnue to stay positive Short: Enjoy beach trip Long: Continue to exercise for mental boost Short: Continue to come to rehab for mood boost Long: Continue to maintain positive attitude   Interventions Stress management education;Encouraged to attend Cardiac Rehabilitation for the exercise Encouraged to attend Cardiac Rehabilitation for the exercise Encouraged to attend Cardiac Rehabilitation for the exercise Encouraged to attend Cardiac Rehabilitation for the exercise Encouraged to attend Cardiac Rehabilitation for the exercise   Continue Psychosocial Services  Follow up required by staff Follow up required by staff Follow up required by staff Follow up required by staff Follow up required by staff            Psychosocial Discharge (Final Psychosocial Re-Evaluation):  Psychosocial Re-Evaluation - 12/13/22 1009       Psychosocial Re-Evaluation   Current issues with None Identified    Comments Darsha is recovering from having covid but is feeling better overall. She just retired in december! She feels more relaxed, but still dealing with paperwork.  She is not too free with time yet as she spends a lot of time for  her mom and  MIL taking care of them. She has good support from her spouse! She denies any other concerns at this time.    Expected Outcomes Short: Continue to come to rehab for mood boost Long: Continue to maintain positive attitude    Interventions Encouraged to attend Cardiac Rehabilitation for the exercise    Continue Psychosocial Services  Follow up required by staff             Vocational Rehabilitation: Provide vocational rehab assistance to qualifying candidates.   Vocational Rehab Evaluation & Intervention:   Education: Education Goals: Education classes will be provided on a variety of topics geared toward better understanding of heart health and risk factor modification. Participant will state  understanding/return demonstration of topics presented as noted by education test scores.  Learning Barriers/Preferences:  Learning Barriers/Preferences - 08/02/22 1124       Learning Barriers/Preferences   Learning Barriers None    Learning Preferences None             General Cardiac Education Topics:  AED/CPR: - Group verbal and written instruction with the use of models to demonstrate the basic use of the AED with the basic ABC's of resuscitation.   Anatomy and Cardiac Procedures: - Group verbal and visual presentation and models provide information about basic cardiac anatomy and function. Reviews the testing methods done to diagnose heart disease and the outcomes of the test results. Describes the treatment choices: Medical Management, Angioplasty, or Coronary Bypass Surgery for treating various heart conditions including Myocardial Infarction, Angina, Valve Disease, and Cardiac Arrhythmias.  Written material given at graduation.   Medication Safety: - Group verbal and visual instruction to review commonly prescribed medications for heart and lung disease. Reviews the medication, class of the drug, and side effects. Includes the steps to properly  store meds and maintain the prescription regimen.  Written material given at graduation.   Intimacy: - Group verbal instruction through game format to discuss how heart and lung disease can affect sexual intimacy. Written material given at graduation.. Flowsheet Row Cardiac Rehab from 12/29/2022 in [facility] Cardiac and Pulmonary Rehab  Education need identified 08/09/22       Know Your Numbers and Heart Failure: - Group verbal and visual instruction to discuss disease risk factors for cardiac and pulmonary disease and treatment options.  Reviews associated critical values for Overweight/Obesity, Hypertension, Cholesterol, and Diabetes.  Discusses basics of heart failure: signs/symptoms and treatments.  Introduces Heart Failure Zone  chart for action plan for heart failure.  Written material given at graduation.   Infection Prevention: - Provides verbal and written material to individual with discussion of infection control including proper hand washing and proper equipment cleaning during exercise session. Flowsheet Row Cardiac Rehab from 12/29/2022 in [facility] Cardiac and Pulmonary Rehab  Date 08/02/22  Educator [facility]  Instruction Review Code 1- Verbalizes Understanding       Falls Prevention: - Provides verbal and written material to individual with discussion of falls prevention and safety. Flowsheet Row Cardiac Rehab from 12/29/2022 in [facility] Cardiac and Pulmonary Rehab  Date 08/02/22  Educator [facility]  Instruction Review Code 1- Verbalizes Understanding       Other: -Provides group and verbal instruction on various topics (see comments)   Knowledge Questionnaire Score:  Knowledge Questionnaire Score - 12/20/22 1111       Knowledge Questionnaire Score   Post Score 26/26             Core Components/Risk Factors/Patient Goals at Admission:  Personal Goals and Risk Factors at Admission - 08/09/22 1202       Core Components/Risk Factors/Patient Goals on Admission    Weight Management Yes;Weight Loss;Obesity    Intervention Weight Management: Develop a combined nutrition and exercise program designed to reach desired caloric intake, while maintaining appropriate intake of nutrient and fiber, sodium and fats, and appropriate energy expenditure required for the weight goal.;Weight Management/Obesity: Establish reasonable short term and long term weight goals.;Weight Management: Provide education and appropriate resources to help participant work on and attain dietary goals.;Obesity: Provide education and appropriate resources to help participant work on and attain dietary goals.    Admit Weight 194 lb 12.8 oz (88.4 kg)    Goal Weight: Short Term 190 lb (86.2 kg)    Goal Weight: Long Term 185 lb (83.9 kg)     Expected Outcomes Short Term: Continue to assess and modify interventions until short term weight is achieved;Long Term: Adherence to nutrition and physical activity/exercise program aimed toward attainment of established weight goal;Weight Loss: Understanding of general recommendations for a balanced deficit meal plan, which promotes 1-2 lb weight loss per week and includes a negative energy balance of [phone] kcal/d;Understanding recommendations for meals to include 15-35% energy as protein, 25-35% energy from fat, 35-60% energy from carbohydrates, less than 200mg  of dietary cholesterol, 20-35 gm of total fiber daily;Understanding of distribution of calorie intake throughout the day with the consumption of 4-5 meals/snacks    Hypertension Yes    Intervention Provide education on lifestyle modifcations including regular physical activity/exercise, weight management, moderate sodium restriction and increased consumption of fresh fruit, vegetables, and low fat dairy, alcohol moderation, and smoking cessation.;Monitor prescription use compliance.    Expected Outcomes Short Term: Continued assessment and intervention until BP is < 140/11mm HG in hypertensive participants. < 130/35mm HG  in hypertensive participants with diabetes, heart failure or chronic kidney disease.;Long Term: Maintenance of blood pressure at goal levels.    Lipids Yes    Intervention Provide education and support for participant on nutrition & aerobic/resistive exercise along with prescribed medications to achieve LDL 70mg , HDL >40mg .    Expected Outcomes Short Term: Participant states understanding of desired cholesterol values and is compliant with medications prescribed. Participant is following exercise prescription and nutrition guidelines.;Long Term: Cholesterol controlled with medications as prescribed, with individualized exercise RX and with personalized nutrition plan. Value goals: LDL < 70mg , HDL > 40 mg.              Education:Diabetes - Individual verbal and written instruction to review signs/symptoms of diabetes, desired ranges of glucose level fasting, after meals and with exercise. Acknowledge that pre and post exercise glucose checks will be done for 3 sessions at entry of program.   Core Components/Risk Factors/Patient Goals Review:   Goals and Risk Factor Review     Row Name 08/25/22 1029 09/20/22 1400 10/11/22 1350 11/24/22 1015 12/13/22 1011     Core Components/Risk Factors/Patient Goals Review   Personal Goals Review Weight Management/Obesity;Heart Failure;Hypertension;Lipids Weight Management/Obesity;Heart Failure;Hypertension;Lipids Weight Management/Obesity;Heart Failure;Hypertension;Lipids Weight Management/Obesity;Heart Failure;Hypertension;Lipids Weight Management/Obesity;Hypertension;Lipids   Review Calysta is doing well in rehab. Her weight will do well as long as she is not eating at the beach. Anytime she goes to beach, she notices her weight goes up.  She has not had any heart failure symtptoms.  Her pressures are doing well overall, but as they are working on her medications due to a cough she has noticed that it will go up and down.  She sees the PA again next week for next adjustment. Salima continues to do well. She states she has lost a few pounds but wants to lose more, no weight goal. She does weigh herself daily and is aware to be mindful of any sudden weight gain that could possibly be fluid. She denies heart failure symptoms. She is trying to avoid high saturated dats and fried foods to help with her weight loss. Her cough is gone as her doctor switched her medications around and found out which one was causing the cough. She has a physical next week to review her bloodwork as her doctor states she is anemic and has low sodium. They will discuss possible supplements/next stemps. BP at home is good- she has a log on her phone. They range 300-762U systolic and 63-33L diastolic. Her  doctor swtiched her BP meds 2 weeks ago and is dilligently watching her pressures Amber is doing well in rehab. Her weight is holding steady and she would like to lose.  Her pressures are still a little low for her as she is dizzy in the morning.  She has not had any heart failure symptoms otherwise.  She is feeling better overall and looking forward to retirement in a few days. Latrese is doing well in rehab.  Her weight crept up some while she was out sick as she was not able to exercies.  She has not had heart failure symptoms.  Her pressures are doing well.  She was out sick with COVID and now feeling better. Monc[place]ath is doing well. She has had history of low BP, but hasnt noticed in a while and states it has improved. She does not experience dizziness anymore. She is taking all her medications. BP at rehab is generally 120/60s.  She is trying to lose weight but  has stated around 190 lb. Her goal weight is 150 lb, however, her doctor said to not stress about it. She knows to keep an eye on any abnormal values.   Expected Outcomes Short: Continue to work on weight loss Long: Continue to improve stamina. Short: Talk to doctor about blood work (sodium and iron levels) Long: Continue to manage lifestyle risk factors Short: Continue to work on weight loss long: Conitnue to montior risk factors Short: Continue to work on weight loss Long: Continue to monitor risk factors Short: Continue to monitor BP closely Long: Continue to manage lifestyle risk factors            Core Components/Risk Factors/Patient Goals at Discharge (Final Review):   Goals and Risk Factor Review - 12/13/22 1011       Core Components/Risk Factors/Patient Goals Review   Personal Goals Review Weight Management/Obesity;Hypertension;Lipids    Review Chong is doing well. She has had history of low BP, but hasnt noticed in a while and states it has improved. She does not experience dizziness anymore. She is taking all her medications. BP  at rehab is generally 120/60s.  She is trying to lose weight but has stated around 190 lb. Her goal weight is 150 lb, however, her doctor said to not stress about it. She knows to keep an eye on any abnormal values.    Expected Outcomes Short: Continue to monitor BP closely Long: Continue to manage lifestyle risk factors             ITP Comments:  ITP Comments     Row Name 08/02/22 1123 08/09/22 1154 08/11/22 0808 08/11/22 1035 09/08/22 1038   ITP Comments Virtual Visit completed. Patient informed on EP and RD appointment and 6 Minute walk test. Patient also informed of patient health questionnaires on My Chart. Patient Verbalizes understanding. Visit diagnosis can be found in [facility] 06/07/2022. Completed 6MWT and gym orientation. Initial ITP created and sent for review to Dr. Emily Filbert, Medical Director. 30 Day review completed. Medical Director ITP review done, changes made as directed, and signed approval by Medical Director.   New to program First full day of exercise!  Patient was oriented to gym and equipment including functions, settings, policies, and procedures.  Patient's individual exercise prescription and treatment plan were reviewed.  All starting workloads were established based on the results of the 6 minute walk test done at initial orientation visit.  The plan for exercise progression was also introduced and progression will be customized based on patient's performance and goals. 30 Day review completed. Medical Director ITP review done, changes made as directed, and signed approval by Medical Director.    [place] Name 10/06/22 1101 11/03/22 1128 11/09/22 1459 11/24/22 1011 12/01/22 1101   ITP Comments 30 Day review completed. Medical Director ITP review done, changes made as directed, and signed approval by Medical Director. 30 Day review completed. Medical Director ITP review done, changes made as directed, and signed approval by Medical Director. Patient called to let us know she is  out with covid and will not return until 1/17. Gisella returned to rehab today. She will be gone again the rest of the month for MD appt and going back to beach. She has been out due to [place]. 30 Day review completed. Medical Director ITP review done, changes made as directed, and signed approval by Medical Director.    [place] Name 12/29/22 1526 01/03/23 0954         ITP Comments 30 day  review completed. ITP sent to Dr. Emily Filbert, Medical Director of Cardiac Rehab. Continue with ITP unless changes are made by physician. Lajoya graduated today from  rehab with 36 sessions completed.  Details of the patient's exercise prescription and what She needs to do in order to continue the prescription and progress were discussed with patient.  Patient was given a copy of prescription and goals.  Patient verbalized understanding.  Giuseppa plans to continue to exercise by walking and staff videos.               Comments: Discharge ITP

## 2023-01-13 ENCOUNTER — Encounter: Payer: Self-pay | Admitting: Family Medicine

## 2023-01-17 ENCOUNTER — Other Ambulatory Visit (HOSPITAL_COMMUNITY): Payer: Self-pay

## 2023-01-19 ENCOUNTER — Other Ambulatory Visit (HOSPITAL_COMMUNITY): Payer: Self-pay

## 2023-01-20 ENCOUNTER — Other Ambulatory Visit (HOSPITAL_COMMUNITY): Payer: Self-pay

## 2023-01-20 NOTE — Telephone Encounter (Signed)
Patient Advocate Encounter   Received notification from [facility] [place] that prior authorization for Zepbound is required.   PA submitted on 01/20/2023 Key [id number] Status is pending

## 2023-01-24 ENCOUNTER — Telehealth: Payer: Self-pay

## 2023-01-24 ENCOUNTER — Other Ambulatory Visit (HOSPITAL_COMMUNITY): Payer: Self-pay

## 2023-01-24 NOTE — Telephone Encounter (Signed)
Pharmacy Patient Advocate Encounter  Prior Authorization for [facility] has been approved by [place] (ins).    PA # [id number] Effective dates: 01/20/2023 through 01/20/2024   Copay is 0.00 LETTER OF APPROVAL IS SCANNED IN TO CHART Selinda Orion CPhT Rx Patient Advocate [phone]

## 2023-01-24 NOTE — Telephone Encounter (Signed)
Pharmacy Patient Advocate Encounter  Prior Authorization for Zepbound has been approved by [employer] [place] (ins).    PA # [id number] Effective dates: 01/20/2023 through 01/20/2024

## 2023-01-24 NOTE — Telephone Encounter (Signed)
Approved. Please note: The allowed amount is 2 mL (4 pens) per 180 days. This medication is to be increased in strength every 30 days, as tolerated by the member, to a maintenance dose of up to 15mg /0.68mL (4 pens per 28 days). The program limit of 4 pens per 180 days of the 2.5mg /0.43mL strength allows for this dose increase. Please note: this approval is due to a verified shortage of a preferred medication.   PLEASE BE ADVISED.

## 2023-01-25 MED ORDER — ZEPBOUND 7.5 MG/0.5ML ~~LOC~~ SOAJ: 7.5000 mg | SUBCUTANEOUS | 0 refills | Status: DC

## 2023-01-25 MED ORDER — ZEPBOUND 2.5 MG/0.5ML ~~LOC~~ SOAJ: 2.5000 mg | SUBCUTANEOUS | 0 refills | Status: DC

## 2023-01-25 NOTE — Addendum Note (Signed)
Addended by: Carter Kitten on: 01/25/2023 11:42 AM   Modules accepted: Orders

## 2023-01-25 NOTE — Telephone Encounter (Signed)
Dorismae notified as instructed by telephone.  Patient states she is currently doing the 5 mg week and has been on this now for 3 week and tolerating it fine.  Will need Rx for next dose of 7.5 mg weekly.  If okay to send in, please sign Rx below.

## 2023-01-25 NOTE — Telephone Encounter (Signed)
Make patient aware of this note.  It looks like she was on Mounjaro, do I need to send in a new prescription for that bound.  Verify what current dose she is using right now.  Pharmacy?

## 2023-01-25 NOTE — Addendum Note (Signed)
Addended by: Eliezer Lofts E on: 01/25/2023 02:14 PM   Modules accepted: Orders

## 2023-02-15 MED ORDER — ZEPBOUND 5 MG/0.5ML ~~LOC~~ SOAJ
5.0000 mg | SUBCUTANEOUS | 11 refills | Status: DC
  Filled 2023-03-18: qty 2, 28d supply, fill #0

## 2023-02-15 NOTE — Telephone Encounter (Signed)
Okay to resend in Rx for 5 mg?

## 2023-02-22 ENCOUNTER — Telehealth: Payer: Self-pay | Admitting: *Deleted

## 2023-02-22 NOTE — Telephone Encounter (Addendum)
Eliquis 5mg  refill request received. Patient is 62 years old, weight-88.1kg, Crea-0.94 on 09/17/22, Diagnosis-Afib, and last seen by Dr. Lalla Brothers on 03/30/22. Dose is appropriate based on dosing criteria.  Noticed there were a lot of encounters from [place]. Therefore, called the pt and she stated she does go to [facility] and will have it sent her Cardiologist. She was thankful for the call.

## 2023-02-28 ENCOUNTER — Encounter: Payer: Self-pay | Admitting: Family Medicine

## 2023-03-01 ENCOUNTER — Other Ambulatory Visit: Payer: Self-pay | Admitting: Family Medicine

## 2023-03-13 ENCOUNTER — Other Ambulatory Visit: Payer: Self-pay | Admitting: Cardiology

## 2023-03-17 ENCOUNTER — Other Ambulatory Visit: Payer: Self-pay | Admitting: Family Medicine

## 2023-03-17 MED ORDER — SEMAGLUTIDE-WEIGHT MANAGEMENT 1 MG/0.5ML ~~LOC~~ SOAJ: 1.0000 mg | SUBCUTANEOUS | 11 refills | Status: DC

## 2023-03-18 ENCOUNTER — Other Ambulatory Visit (HOSPITAL_COMMUNITY): Payer: Self-pay

## 2023-03-21 ENCOUNTER — Telehealth: Payer: Self-pay

## 2023-03-23 NOTE — Telephone Encounter (Signed)
Per cover my meds no update

## 2023-03-24 NOTE — Telephone Encounter (Signed)
PA submitted, see additional encounter for updates.  

## 2023-03-30 ENCOUNTER — Telehealth: Payer: Self-pay | Admitting: Family Medicine

## 2023-03-30 ENCOUNTER — Other Ambulatory Visit: Payer: Self-pay

## 2023-03-30 ENCOUNTER — Telehealth: Payer: Self-pay | Admitting: Internal Medicine

## 2023-03-30 MED ORDER — PREDNISONE 10 MG PO TABS: ORAL_TABLET | ORAL | 0 refills | Status: DC

## 2023-03-30 MED ORDER — AZITHROMYCIN 250 MG PO TABS: ORAL_TABLET | ORAL | 0 refills | Status: DC

## 2023-03-30 NOTE — Telephone Encounter (Signed)
Spoke with patient. She complains of productive cough with green phlegm and over the last week there has been blood. Some wheezing in the morning. Patient has been using albuterol inhaler and Flonase  [facility] [place]  Dr. Sherene Sires please advise

## 2023-03-30 NOTE — Telephone Encounter (Signed)
Order that was placed in 12/2022 printed and faxed to [facility] for diagnostic mammogram at [phone].

## 2023-03-30 NOTE — Telephone Encounter (Signed)
Amanda Estrada from mammography department at [facility] in stating that she needs a corrected order,for bilateral diagnostic mammogram. Patients previous order  is dated for 05/25/21,patient was scheduled to have a 6 m scan done that she did not get completed. Therefore  now she needs the bilateral diagnostic mammogram done.    Fax number:[phone] Cannot take electronic fax,have to be sent by machine manually

## 2023-03-30 NOTE — Telephone Encounter (Signed)
I haven't seen her in > 3 years so first needs t make/ keep appt to continue out pt rx but if can't be done this week at least schedule it and can offer Zpak Prednisone 10 mg take  4 each am x 2 days,   2 each am x 2 days,  1 each am x 2 days and stop

## 2023-03-30 NOTE — Telephone Encounter (Signed)
ATC X1 LVM for patient to call the office back. Please advise antibiotic and prednisone has been sent to pharmacy pt needs OV with Dr. Sherene Sires

## 2023-03-30 NOTE — Telephone Encounter (Signed)
This was already ordered in 12/2022... what does she need different?

## 2023-03-30 NOTE — Telephone Encounter (Signed)
Patient states having symptom of coughing up blood. Pharmacy Is [facility]. Patient phone number is [phone].

## 2023-03-31 DIAGNOSIS — I480 Paroxysmal atrial fibrillation: Secondary | ICD-10-CM | POA: Diagnosis not present

## 2023-03-31 DIAGNOSIS — I2510 Atherosclerotic heart disease of native coronary artery without angina pectoris: Secondary | ICD-10-CM | POA: Diagnosis not present

## 2023-03-31 DIAGNOSIS — E7800 Pure hypercholesterolemia, unspecified: Secondary | ICD-10-CM | POA: Diagnosis not present

## 2023-03-31 DIAGNOSIS — I10 Essential (primary) hypertension: Secondary | ICD-10-CM | POA: Diagnosis not present

## 2023-03-31 NOTE — Telephone Encounter (Signed)
Called and spoke with pt letting her know that meds were sent to the pharmacy for her and stated to her to make sure she kept upcoming appt. Pt verbalized understanding. Nothing further needed.

## 2023-04-06 ENCOUNTER — Other Ambulatory Visit: Payer: Self-pay | Admitting: Family Medicine

## 2023-04-06 NOTE — Telephone Encounter (Signed)
Pharmacy is requesting refill for Zepbound 5 mg but 7. 5 mg is on current medication list.  Please advise.

## 2023-04-12 ENCOUNTER — Encounter: Payer: Self-pay | Admitting: Family Medicine

## 2023-04-12 MED ORDER — ONDANSETRON HCL 4 MG PO TABS: 4.0000 mg | ORAL_TABLET | Freq: Three times a day (TID) | ORAL | 0 refills | Status: DC | PRN

## 2023-04-27 ENCOUNTER — Other Ambulatory Visit: Payer: Self-pay | Admitting: Family Medicine

## 2023-04-28 ENCOUNTER — Other Ambulatory Visit: Payer: Self-pay

## 2023-04-28 MED ORDER — LOSARTAN POTASSIUM 100 MG PO TABS
100.0000 mg | ORAL_TABLET | Freq: Every day | ORAL | 1 refills | Status: AC
  Filled 2023-04-28: qty 90, 90d supply, fill #0

## 2023-04-28 MED ORDER — ZEPBOUND 7.5 MG/0.5ML ~~LOC~~ SOAJ: 7.5000 mg | SUBCUTANEOUS | 2 refills | Status: DC

## 2023-05-09 DIAGNOSIS — M79671 Pain in right foot: Secondary | ICD-10-CM | POA: Diagnosis not present

## 2023-05-09 DIAGNOSIS — M7661 Achilles tendinitis, right leg: Secondary | ICD-10-CM | POA: Diagnosis not present

## 2023-05-09 DIAGNOSIS — M9261 Juvenile osteochondrosis of tarsus, right ankle: Secondary | ICD-10-CM | POA: Diagnosis not present

## 2023-05-09 DIAGNOSIS — M7731 Calcaneal spur, right foot: Secondary | ICD-10-CM | POA: Diagnosis not present

## 2023-05-09 DIAGNOSIS — R928 Other abnormal and inconclusive findings on diagnostic imaging of breast: Secondary | ICD-10-CM | POA: Diagnosis not present

## 2023-05-09 LAB — HM MAMMOGRAPHY

## 2023-05-23 NOTE — Progress Notes (Unsigned)
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 Kerby Nora. Pt c/o cough for the past year, worse x 2 wks- prod with bright red blood.  Cough is worse in the am's, she wakes up and sees blood on her pillow. She has also been having SOB with walking up stairs or at a fast pace. She is using an albuterol inhaler 2 x daily on average.   intermittent cough /wheeze x 1.5 years with nl spirometry during flare and low grade hemoptysis x one year s epistaxis  Feels "all better" only while on prednisone with baseline 5/7 days of the week has minimal symptoms and really Not limited by breathing from desired activities  But 2/7 days some wheeze/doe and cough productive of streaks of blood but nothing purulent Has gerd already on ppi s overt hb  W/u Fob 08/10/2017 > polypoid smooth mass obst LLL sup segment > atypical cells on cytology but bx of polypoid lesion= nl epithelium with benign features > excision by Dorris Fetch 09/19/17 >Video bronchoscopy with endobronchial tumor resection and laser ablation. benign bronchial papillary adenoma.      07/28/2018 acute extended ov/Amanda Estrada re:  Re-establish re cough ? Recurrent bronchial adenoma? Chief Complaint  Patient presents with   Acute  Visit    Cough x 1 month- clear sputum.  Cough is worse in the mornings. She has noticed that talking can trigger the cough.     indolent onset of recurrent day greater than nocturnal cough around 1 month prior to office visit while being maintained on Singulair and Zyrtec with the addition of Protonix albuterol and Flonase making no difference so far and also switched from Zyrtec to Xyzal also with no difference so far. rec Prednisone 10 mg take  4 each am x 2 days,   2 each am x 2 days,  1 each am x 2 days and stop  Change protonix to 40 mg Take 30- 60 min before your first and last meals of the day  Take delsym two tsp every 12 hours and supplement if needed with  tramadol 50 mg up to 1 every 4 hours . Once you have eliminated the cough for 3 straight days try reducing the tramadol first,  then the delsym as tolerated.   GERD (REFLUX)  is an extremely common cause of respiratory symptoms just like yours , many times with no obvious heartburn at all.     07/31/18  NP rec Symptoms seem to be reflux related Strict bland diet - hand out given Avoid foods that increase reflux - hand out given Will order carafate Please continue Protonix as directed  Continue delsym  and ultram for cough May stop prednisone for the next couple of days Please call us with an update in a couple of days and for instructions on prednisone Follow up with Dr. Sherene Sires in around 1 week   08/07/2018  f/u ov/Amanda Estrada re: cough since late aug 2019  Chief Complaint  Patient presents with   Follow-up    She is coughing with clear sputum. She has not had any more hemoptysis since the last visit.    Dyspnea:  no Cough: wakes up coughing around 4 am but just clear mucus now x maybe 1 tsp  Sleeping: ok until 4 am   SABA use: not helping 02: none   Rec Depomedrol 120 mg IM today to see if that helps your cough  Stop toprol, losartan and singulair and tessalon  Start bisoprolol 5 mg each am   715 am Wed 08/16/18 FOB = carcinoid   >>  excision by Dorris Fetch 09/19/17 >Video bronchoscopy with endobronchial tumor resection and laser ablation. benign bronchial papillary adenoma.   - FOB 08/07/2018 nl airways     05/25/2023  Re establish  ov/Amanda Estrada office/Amanda Estrada re: hemoptysis maint on no resp meds   Chief Complaint  Patient presents with   Follow-up    Cough with mucus persistent.  Cough has improved, no blood in cough.  CABG 06/2022 at [facility] while on long term eliquis with low grade hemoptysis ever since  Rx 03/30/23 phone note:  zpak/ pred helped but cough persisted s blood so self referred back to pulmonary clinic Dyspnea:  limited more by bone spurs as long as me Cough: onset at hs better if sits up and wakes up with it > mucus is clear assoc with sense of pnds no resolved on xyzal  Sleeping: flat bed 2 pillows  SABA use: albuterol started before surgery and continued prn but rarely needed until 03/2023  not sure it helps  02: none  Note cough started orginally while on ACEi but off now "for a while" (not sure when it was d/c'd)     No obvious day to day or daytime variability or assoc excess/ purulent sputum or mucus plugs or ongoing hemoptysis or cp or chest tightness, subjective wheeze or overt sinus or hb symptoms.   Sleps as abvove  without nocturnal  or early am exacerbation  of respiratory  c/o's or need for noct saba. Also denies any obvious fluctuation of symptoms with weather or environmental changes or other aggravating or alleviating factors except as outlined above   No unusual exposure hx or h/o childhood pna/ asthma or knowledge of premature birth.  Current Allergies, Complete Past Medical History, Past Surgical History, Family History, and Social History were reviewed in [employer] record.  ROS  The following are not active complaints unless bolded Hoarseness, sore throat, dysphagia, dental problems, itching, sneezing,  nasal congestion or discharge of excess mucus or  purulent secretions, ear ache,   fever, chills, sweats, unintended wt loss or wt gain, classically pleuritic or exertional cp,  orthopnea pnd or arm/hand swelling  or leg swelling, presyncope, palpitations, abdominal pain, anorexia, nausea, vomiting, diarrhea  or change in bowel habits or change in bladder habits, change in stools or change in urine, dysuria, hematuria,  rash, arthralgias, visual complaints, headache, numbness, weakness or ataxia or problems with walking or coordination,  change in mood or  memory.        Current Meds  Medication Sig   albuterol (VENTOLIN HFA) 108 (90 Base)  MCG/ACT inhaler Inhale 2 puffs into the lungs every 4 (four) hours as needed for wheezing or shortness of breath.   amiodarone (PACERONE) 200 MG tablet Take by mouth.   apixaban (ELIQUIS) 5 MG TABS tablet Take 1 tablet (5 mg total) by mouth 2 (two) times daily.   aspirin EC 81 MG tablet Take by mouth.   bisoprolol (ZEBETA) 10 MG tablet Take 1 tablet (10 mg total) by mouth daily.   cloNIDine (CATAPRES) 0.1 MG tablet TAKE 1 TABLET BY MOUTH EVERYDAY AT BEDTIME   diltiazem (CARDIZEM CD) 240 MG 24 hr capsule TAKE 1 CAPSULE BY MOUTH EVERY DAY   fluticasone (FLONASE) 50 MCG/ACT nasal spray PLACE 1 SPRAY INTO BOTH NOSTRILS DAILY AS NEEDED FOR ALLERGIES.   levocetirizine (XYZAL) 5 MG tablet Take 5 mg by mouth at bedtime.    lisinopril (ZESTRIL) 20 MG tablet Take 20 mg by mouth daily.   losartan (COZAAR) 100 MG tablet Take 1 tablet (100 mg total) by mouth daily.   montelukast (SINGULAIR) 10 MG tablet TAKE 1 TABLET BY MOUTH EVERYDAY AT BEDTIME   omeprazole (PRILOSEC) 40 MG capsule Take 1 capsule (40 mg total) by mouth 2 (two) times daily.   ondansetron (ZOFRAN) 4 MG tablet Take 1 tablet (4 mg total) by mouth every 8 (eight) hours as needed for nausea or vomiting.   rosuvastatin (CRESTOR) 40 MG tablet Take 40 mg by mouth daily.   ZEPBOUND 7.5 MG/0.5ML Pen Inject 7.5 mg into the skin once a week.                Objective:   Physical Exam  wts  05/25/2023       170  08/07/2018       172   07/28/2018      173   08/04/17 188 lb 6.4 oz (85.5 kg)  08/02/17 190 lb 8 oz (86.4 kg)  03/04/17 199 lb 8 oz (90.5 kg)    Vital signs reviewed  05/25/2023  - Note at rest 02 sats  100 % on RA   General appearance:    pleasant wf nad   HEENT : Oropharynx  clear s excess pnds/ cobblestoning      Nasal turbinates nl    NECK :  without  apparent JVD/ palpable Nodes/TM    LUNGS: no acc muscle use,  Nl contour chest which is clear to A and P bilaterally without cough on insp or exp maneuvers   CV:  RRR  no s3 or murmur or increase in P2, and no edema   ABD:  soft and nontender with nl inspiratory excursion in the supine position. No bruits or organomegaly appreciated   MS:  Nl gait/ ext warm without deformities Or obvious joint restrictions  calf tenderness, cyanosis or clubbing    SKIN: warm and dry without lesions    NEURO:  alert, approp, nl sensorium with  no motor or cerebellar deficits apparent.       CXR PA and Lateral:   05/25/2023 :    I personally reviewed images and impression is as follows:     No findings to explain cough              Assessment:

## 2023-05-25 ENCOUNTER — Encounter: Payer: Self-pay | Admitting: Internal Medicine

## 2023-05-25 ENCOUNTER — Ambulatory Visit: Payer: BC Managed Care – PPO | Admitting: Internal Medicine

## 2023-05-25 ENCOUNTER — Ambulatory Visit (INDEPENDENT_AMBULATORY_CARE_PROVIDER_SITE_OTHER): Payer: BC Managed Care – PPO

## 2023-05-25 VITALS — BP 124/62 | HR 81 | Temp 97.8°F | Ht 65.0 in | Wt 170.0 lb

## 2023-05-25 DIAGNOSIS — R918 Other nonspecific abnormal finding of lung field: Secondary | ICD-10-CM

## 2023-05-25 DIAGNOSIS — R053 Chronic cough: Secondary | ICD-10-CM

## 2023-05-25 DIAGNOSIS — R059 Cough, unspecified: Secondary | ICD-10-CM | POA: Diagnosis not present

## 2023-05-25 DIAGNOSIS — Z981 Arthrodesis status: Secondary | ICD-10-CM | POA: Diagnosis not present

## 2023-05-25 NOTE — Assessment & Plan Note (Signed)
Fob 08/10/2017 > polypoid smooth mass obst LLL sup segment > atypical cells on cytology but bx of polypoid lesion= nl epithelium with benign features > excision by Dorris Fetch 09/19/17 >Video bronchoscopy with endobronchial tumor resection and laser ablation. benign bronchial papillary adenoma.   - FOB 08/07/2018 nl airways   With nl exam and another explanation for cough I feel recurrence is unlikely and best way to be sure would be to repet FOB but this would likely aggravate the cough and turn out negative so will try to address the cough first and regroup in 6 weeks.  Discussed in detail all the  indications, usual  risks and alternatives  relative to the benefits with patient who agrees to proceed with w/u as outlined.            Each maintenance medication was reviewed in detail including emphasizing most importantly the difference between maintenance and prns and under what circumstances the prns are to be triggered using an action plan format where appropriate.  Total time for H and P, chart review, counseling, reviewing hfa  device(s) and generating customized AVS unique to this office visit / same day charting = 45 min with pt not seen in > 3 y

## 2023-05-25 NOTE — Assessment & Plan Note (Addendum)
Onset s/p CABG At [facility] 06/2022 with sensation of pnds refractory to xyzal > try 1st gen H1 blockers per guidelines    Cough is typical of Upper airway cough syndrome (previously labeled PNDS),  is so named because it's frequently impossible to sort out how much is  CR/sinusitis with freq throat clearing (which can be related to primary GERD)   vs  causing  secondary (" extra esophageal")  GERD from wide swings in gastric pressure that occur with throat clearing, often  promoting self use of mint and menthol lozenges that reduce the lower esophageal sphincter tone and exacerbate the problem further in a cyclical fashion.   These are the same pts (now being labeled as having "irritable larynx syndrome" by some cough centers) who not infrequently have a history of having failed to tolerate ace inhibitors,  dry powder inhalers or biphosphonates or report having atypical/extraesophageal reflux symptoms that don't respond to standard doses of PPI  and are easily confused as having aecopd or asthma flares by even experienced allergists/ pulmonologists (myself included).   Rec trial of max gerd rx and 1st gen H1 blockers per guidelines  and f/u in 6 weeks  with all meds in hand using a trust but verify approach to confirm accurate Medication  Reconciliation The principal here is that until we are certain that the  patients are doing what we've asked, it makes no sense to ask them to do more.

## 2023-05-27 ENCOUNTER — Encounter: Payer: Self-pay | Admitting: Family Medicine

## 2023-05-27 MED ORDER — FLUTICASONE PROPIONATE 50 MCG/ACT NA SUSP: 1.0000 | Freq: Every day | NASAL | 0 refills | Status: DC | PRN

## 2023-06-04 ENCOUNTER — Other Ambulatory Visit: Payer: Self-pay | Admitting: Family Medicine

## 2023-06-06 NOTE — Telephone Encounter (Signed)
Last office visit 12/17/2022 for obesity.  Lat refilled 04/12/2023 for #20 with no refills.  Next appt: No future appointments with PCP.

## 2023-06-27 ENCOUNTER — Other Ambulatory Visit: Payer: Self-pay | Admitting: Family Medicine

## 2023-06-27 NOTE — Telephone Encounter (Signed)
Patient scheduled.

## 2023-06-27 NOTE — Telephone Encounter (Signed)
Please schedule CPE with lasting labs prior for Dr. Ermalene Searing after 11/17/20204.

## 2023-07-07 ENCOUNTER — Encounter: Payer: Self-pay | Admitting: Family Medicine

## 2023-07-08 MED ORDER — TIRZEPATIDE-WEIGHT MANAGEMENT 10 MG/0.5ML ~~LOC~~ SOAJ: 10.0000 mg | SUBCUTANEOUS | 0 refills | Status: DC

## 2023-07-12 ENCOUNTER — Ambulatory Visit: Payer: BC Managed Care – PPO | Admitting: Internal Medicine

## 2023-08-12 DIAGNOSIS — M7731 Calcaneal spur, right foot: Secondary | ICD-10-CM | POA: Diagnosis not present

## 2023-08-12 DIAGNOSIS — M7661 Achilles tendinitis, right leg: Secondary | ICD-10-CM | POA: Diagnosis not present

## 2023-08-12 DIAGNOSIS — M9261 Juvenile osteochondrosis of tarsus, right ankle: Secondary | ICD-10-CM | POA: Diagnosis not present

## 2023-08-12 DIAGNOSIS — M79671 Pain in right foot: Secondary | ICD-10-CM | POA: Diagnosis not present

## 2023-09-01 ENCOUNTER — Other Ambulatory Visit: Payer: Self-pay | Admitting: Family Medicine

## 2023-09-12 ENCOUNTER — Encounter: Payer: Self-pay | Admitting: Dermatology

## 2023-09-12 ENCOUNTER — Ambulatory Visit (INDEPENDENT_AMBULATORY_CARE_PROVIDER_SITE_OTHER): Payer: BC Managed Care – PPO | Admitting: Dermatology

## 2023-09-12 DIAGNOSIS — L814 Other melanin hyperpigmentation: Secondary | ICD-10-CM

## 2023-09-12 DIAGNOSIS — L578 Other skin changes due to chronic exposure to nonionizing radiation: Secondary | ICD-10-CM | POA: Diagnosis not present

## 2023-09-12 DIAGNOSIS — L821 Other seborrheic keratosis: Secondary | ICD-10-CM

## 2023-09-12 DIAGNOSIS — W908XXA Exposure to other nonionizing radiation, initial encounter: Secondary | ICD-10-CM | POA: Diagnosis not present

## 2023-09-12 DIAGNOSIS — Z1283 Encounter for screening for malignant neoplasm of skin: Secondary | ICD-10-CM

## 2023-09-12 DIAGNOSIS — Z86018 Personal history of other benign neoplasm: Secondary | ICD-10-CM

## 2023-09-12 DIAGNOSIS — L570 Actinic keratosis: Secondary | ICD-10-CM | POA: Diagnosis not present

## 2023-09-12 DIAGNOSIS — D229 Melanocytic nevi, unspecified: Secondary | ICD-10-CM

## 2023-09-12 DIAGNOSIS — B079 Viral wart, unspecified: Secondary | ICD-10-CM

## 2023-09-12 DIAGNOSIS — D1801 Hemangioma of skin and subcutaneous tissue: Secondary | ICD-10-CM

## 2023-09-12 NOTE — Progress Notes (Signed)
Follow-Up Visit   Subjective  Amanda Estrada is a 62 y.o. female who presents for the following: Skin Cancer Screening and Full Body Skin Exam  The patient presents for Total-Body Skin Exam (TBSE) for skin cancer screening and mole check. The patient has spots, moles and lesions to be evaluated, some may be new or changing and the patient may have concern these could be cancer.  Patient with hx of dysplastic nevi. Patient with a spot at right forearm that has gotten bigger.   The following portions of the chart were reviewed this encounter and updated as appropriate: medications, allergies, medical history  Review of Systems:  No other skin or systemic complaints except as noted in HPI or Assessment and Plan.  Objective  Well appearing patient in no apparent distress; mood and affect are within normal limits.  A full examination was performed including scalp, head, eyes, ears, nose, lips, neck, chest, axillae, abdomen, back, buttocks, bilateral upper extremities, bilateral lower extremities, hands, feet, fingers, toes, fingernails, and toenails. All findings within normal limits unless otherwise noted below.   Exam of nails limited by presence of nail polish.  Exam of face limited by presence of make up.   Relevant physical exam findings are noted in the Assessment and Plan.  chest x 10, R dorsal hand x 3, R upper arm x 1, L hand x 2, L elbow x 1 (17) Erythematous thin papules/macules with gritty scale.     Assessment & Plan   SKIN CANCER SCREENING PERFORMED TODAY.  ACTINIC DAMAGE - Chronic condition, secondary to cumulative UV/sun exposure - diffuse scaly erythematous macules with underlying dyspigmentation - Recommend daily broad spectrum sunscreen SPF 30+ to sun-exposed areas, reapply every 2 hours as needed.  - Staying in the shade or wearing long sleeves, sun glasses (UVA+UVB protection) and wide brim hats (4-inch brim around the entire circumference of the hat) are  also recommended for sun protection.  - Call for new or changing lesions.  LENTIGINES, SEBORRHEIC KERATOSES, HEMANGIOMAS - Benign normal skin lesions - Benign-appearing - Call for any changes  MELANOCYTIC NEVI - Tan-brown and/or pink-flesh-colored symmetric macules and papules - Benign appearing on exam today - Observation - Call clinic for new or changing moles - Recommend daily use of broad spectrum spf 30+ sunscreen to sun-exposed areas.   History of Dysplastic Nevi - No evidence of recurrence today, right paraspinal mid to upper back/severe - Recommend regular full body skin exams - Recommend daily broad spectrum sunscreen SPF 30+ to sun-exposed areas, reapply every 2 hours as needed.  - Call if any new or changing lesions are noted between office visits  WART Exam: verrucous papule with collarette of scale  Counseling Discussed viral / HPV (Human Papilloma Virus) etiology and risk of spread /infectivity to other areas of body as well as to other people.  Multiple treatments and methods may be required to clear warts and it is possible treatment may not be successful.  Treatment risks include discoloration; scarring and there is still potential for wart recurrence.  Treatment Plan: Destruction Procedure Note Destruction method: cryotherapy   Informed consent: discussed and consent obtained   Lesion destroyed using liquid nitrogen: Yes   Outcome: patient tolerated procedure well with no complications   Post-procedure details: wound care instructions given   Locations: R 4th finger # of Lesions Treated: 1  Prior to procedure, discussed risks of blister formation, small wound, skin dyspigmentation, or rare scar following cryotherapy. Recommend Vaseline ointment to treated  areas while healing.    AK (actinic keratosis) (17) chest x 10, R dorsal hand x 3, R upper arm x 1, L hand x 2, L elbow x 1  Actinic keratoses are precancerous spots that appear secondary to cumulative UV  radiation exposure/sun exposure over time. They are chronic with expected duration over 1 year. A portion of actinic keratoses will progress to squamous cell carcinoma of the skin. It is not possible to reliably predict which spots will progress to skin cancer and so treatment is recommended to prevent development of skin cancer.  Recommend daily broad spectrum sunscreen SPF 30+ to sun-exposed areas, reapply every 2 hours as needed.  Recommend staying in the shade or wearing long sleeves, sun glasses (UVA+UVB protection) and wide brim hats (4-inch brim around the entire circumference of the hat). Call for new or changing lesions.   Destruction of lesion - chest x 10, R dorsal hand x 3, R upper arm x 1, L hand x 2, L elbow x 1 (17) Complexity: simple   Destruction method: cryotherapy   Informed consent: discussed and consent obtained   Timeout:  patient name, date of birth, surgical site, and procedure verified Lesion destroyed using liquid nitrogen: Yes   Region frozen until ice ball extended beyond lesion: Yes   Cryo cycles: 1 or 2. Outcome: patient tolerated procedure well with no complications   Post-procedure details: wound care instructions given    Multiple benign nevi  Lentigines  Actinic elastosis  Seborrheic keratoses  Cherry angioma  Viral warts, unspecified type   Return in about 1 year (around 09/11/2024) for TBSE, with Dr. Katrinka Blazing, Hx Dysplastic Nevi, Hx AK.  Anise Salvo, RMA, am acting as scribe for Elie Goody, MD .   Documentation: I have reviewed the above documentation for accuracy and completeness, and I agree with the above.  Elie Goody, MD

## 2023-09-12 NOTE — Patient Instructions (Addendum)
 Cryotherapy Aftercare  Wash gently with soap and water everyday.   Apply Vaseline and Band-Aid daily until healed.    Melanoma ABCDEs  Melanoma is the most dangerous type of skin cancer, and is the leading cause of death from skin disease.  You are more likely to develop melanoma if you: Have light-colored skin, light-colored eyes, or red or blond hair Spend a lot of time in the sun Tan regularly, either outdoors or in a tanning bed Have had blistering sunburns, especially during childhood Have a close family member who has had a melanoma Have atypical moles or large birthmarks  Early detection of melanoma is key since treatment is typically straightforward and cure rates are extremely high if we catch it early.   The first sign of melanoma is often a change in a mole or a new dark spot.  The ABCDE system is a way of remembering the signs of melanoma.  A for asymmetry:  The two halves do not match. B for border:  The edges of the growth are irregular. C for color:  A mixture of colors are present instead of an even brown color. D for diameter:  Melanomas are usually (but not always) greater than 6mm - the size of a pencil eraser. E for evolution:  The spot keeps changing in size, shape, and color.  Please check your skin once per month between visits. You can use a small mirror in front and a large mirror behind you to keep an eye on the back side or your body.   If you see any new or changing lesions before your next follow-up, please call to schedule a visit.  Please continue daily skin protection including broad spectrum sunscreen SPF 30+ to sun-exposed areas, reapplying every 2 hours as needed when you're outdoors.    Due to recent changes in healthcare laws, you may see results of your pathology and/or laboratory studies on MyChart before the doctors have had a chance to review them. We understand that in some cases there may be results that are confusing or concerning to you.  Please understand that not all results are received at the same time and often the doctors may need to interpret multiple results in order to provide you with the best plan of care or course of treatment. Therefore, we ask that you please give [place] 2 business days to thoroughly review all your results before contacting the office for clarification. Should we see a critical lab result, you will be contacted sooner.   If You Need Anything After Your Visit  If you have any questions or concerns for your doctor, please call our main line at [phone] and press option 4 to reach your doctor's medical assistant. If no one answers, please leave a voicemail as directed and we will return your call as soon as possible. Messages left after 4 pm will be answered the following business day.   You may also send [place] a message via MyChart. We typically respond to MyChart messages within 1-2 business days.  For prescription refills, please ask your pharmacy to contact our office. Our fax number is [phone].  If you have an urgent issue when the clinic is closed that cannot wait until the next business day, you can page your doctor at the number below.    Please note that while we do our best to be available for urgent issues outside of office hours, we are not available 24/7.   If you have an urgent  issue and are unable to reach [place], you may choose to seek medical care at your doctor's office, retail clinic, urgent care center, or emergency room.  If you have a medical emergency, please immediately call 911 or go to the emergency department.  Pager Numbers  - Dr. Gwen Pounds: [phone]  - Dr. Roseanne Reno: [phone]  - Dr. Katrinka Blazing: [phone]   In the event of inclement weather, please call our main line at [phone] for an update on the status of any delays or closures.  Dermatology Medication Tips: Please keep the boxes that topical medications come in in order to help keep track of the  instructions about where and how to use these. Pharmacies typically print the medication instructions only on the boxes and not directly on the medication tubes.   If your medication is too expensive, please contact our office at [phone] option 4 or send [place] a message through MyChart.   We are unable to tell what your co-pay for medications will be in advance as this is different depending on your insurance coverage. However, we may be able to find a substitute medication at lower cost or fill out paperwork to get insurance to cover a needed medication.   If a prior authorization is required to get your medication covered by your insurance company, please allow [place] 1-2 business days to complete this process.  Drug prices often vary depending on where the prescription is filled and some pharmacies may offer cheaper prices.  The website www.goodrx.com contains coupons for medications through different pharmacies. The prices here do not account for what the cost may be with help from insurance (it may be cheaper with your insurance), but the website can give you the price if you did not use any insurance.  - You can print the associated coupon and take it with your prescription to the pharmacy.  - You may also stop by our office during regular business hours and pick up a GoodRx coupon card.  - If you need your prescription sent electronically to a different pharmacy, notify our office through [facility] or by phone at [phone] option 4.

## 2023-09-27 ENCOUNTER — Other Ambulatory Visit: Payer: BC Managed Care – PPO

## 2023-09-29 ENCOUNTER — Telehealth: Payer: Self-pay | Admitting: *Deleted

## 2023-09-29 DIAGNOSIS — E7800 Pure hypercholesterolemia, unspecified: Secondary | ICD-10-CM

## 2023-09-29 NOTE — Telephone Encounter (Signed)
-----   Message from Alvina Chou sent at 09/29/2023  9:38 AM EST ----- Regarding: Lab orders for Wed, 12.4.24 Patient is scheduled for CPX labs, please order future labs, Thanks , Camelia Eng

## 2023-09-30 ENCOUNTER — Other Ambulatory Visit: Payer: Self-pay | Admitting: Family Medicine

## 2023-10-04 ENCOUNTER — Encounter: Payer: BC Managed Care – PPO | Admitting: Family Medicine

## 2023-10-12 ENCOUNTER — Ambulatory Visit: Payer: BC Managed Care – PPO | Admitting: Dermatology

## 2023-10-12 ENCOUNTER — Other Ambulatory Visit (INDEPENDENT_AMBULATORY_CARE_PROVIDER_SITE_OTHER): Payer: BC Managed Care – PPO

## 2023-10-12 DIAGNOSIS — E7800 Pure hypercholesterolemia, unspecified: Secondary | ICD-10-CM | POA: Diagnosis not present

## 2023-10-12 LAB — COMPREHENSIVE METABOLIC PANEL
ALT: 22 U/L (ref 0–35)
AST: 25 U/L (ref 0–37)
Albumin: 4.3 g/dL (ref 3.5–5.2)
Alkaline Phosphatase: 62 U/L (ref 39–117)
BUN: 13 mg/dL (ref 6–23)
CO2: 26 meq/L (ref 19–32)
Calcium: 9.9 mg/dL (ref 8.4–10.5)
Chloride: 101 meq/L (ref 96–112)
Creatinine, Ser: 0.84 mg/dL (ref 0.40–1.20)
GFR: 74.14 mL/min (ref >60.00)
Glucose, Bld: 87 mg/dL (ref 70–99)
Potassium: 4.3 meq/L (ref 3.5–5.1)
Sodium: 135 meq/L (ref 135–145)
Total Bilirubin: 0.5 mg/dL (ref 0.2–1.2)
Total Protein: 6.7 g/dL (ref 6.0–8.3)

## 2023-10-12 LAB — LIPID PANEL
Cholesterol: 167 mg/dL (ref 0–200)
HDL: 103.4 mg/dL (ref >39.00)
LDL Cholesterol: 52 mg/dL (ref 0–99)
NonHDL: 63.38
Total CHOL/HDL Ratio: 2
Triglycerides: 55 mg/dL (ref 0.0–149.0)
VLDL: 11 mg/dL (ref 0.0–40.0)

## 2023-10-13 NOTE — Progress Notes (Signed)
No critical labs need to be addressed urgently. We will discuss labs in detail at upcoming office visit.   

## 2023-10-19 ENCOUNTER — Ambulatory Visit (INDEPENDENT_AMBULATORY_CARE_PROVIDER_SITE_OTHER)
Admission: RE | Admit: 2023-10-19 | Discharge: 2023-10-19 | Disposition: A | Discharge status: Home / Self Care | Payer: BC Managed Care – PPO | Source: Ambulatory Visit | Attending: Family Medicine | Admitting: Family Medicine

## 2023-10-19 ENCOUNTER — Ambulatory Visit: Payer: BC Managed Care – PPO | Admitting: Family Medicine

## 2023-10-19 ENCOUNTER — Encounter: Payer: Self-pay | Admitting: Family Medicine

## 2023-10-19 VITALS — BP 128/74 | HR 79 | Temp 98.5°F | Ht 64.0 in | Wt 153.4 lb

## 2023-10-19 DIAGNOSIS — I10 Essential (primary) hypertension: Secondary | ICD-10-CM

## 2023-10-19 DIAGNOSIS — E661 Drug-induced obesity: Secondary | ICD-10-CM

## 2023-10-19 DIAGNOSIS — E7800 Pure hypercholesterolemia, unspecified: Secondary | ICD-10-CM | POA: Diagnosis not present

## 2023-10-19 DIAGNOSIS — Z0000 Encounter for general adult medical examination without abnormal findings: Secondary | ICD-10-CM

## 2023-10-19 DIAGNOSIS — M546 Pain in thoracic spine: Secondary | ICD-10-CM | POA: Diagnosis not present

## 2023-10-19 DIAGNOSIS — I4891 Unspecified atrial fibrillation: Secondary | ICD-10-CM

## 2023-10-19 DIAGNOSIS — Z780 Asymptomatic menopausal state: Secondary | ICD-10-CM

## 2023-10-19 DIAGNOSIS — M4856XA Collapsed vertebra, not elsewhere classified, lumbar region, initial encounter for fracture: Secondary | ICD-10-CM | POA: Diagnosis not present

## 2023-10-19 DIAGNOSIS — E66811 Obesity, class 1: Secondary | ICD-10-CM

## 2023-10-19 DIAGNOSIS — Z981 Arthrodesis status: Secondary | ICD-10-CM | POA: Diagnosis not present

## 2023-10-19 DIAGNOSIS — Z6832 Body mass index (BMI) 32.0-32.9, adult: Secondary | ICD-10-CM

## 2023-10-19 MED ORDER — TIRZEPATIDE 12.5 MG/0.5ML ~~LOC~~ SOAJ: 12.5000 mg | SUBCUTANEOUS | 3 refills | Status: DC

## 2023-10-19 NOTE — Assessment & Plan Note (Signed)
Stable, chronic.  Continue current medication.  Rosuvastatin 40 mg daily

## 2023-10-19 NOTE — Patient Instructions (Addendum)
Plan setting up bone density when you do your mammogram next year in 05/2024.  Call Dr. Myrtie Neither to set up colonoscopy.  We will call with X-ray results.

## 2023-10-19 NOTE — Assessment & Plan Note (Signed)
Chronic,  Followed by cardiology Rate controlled with amiodarone and Cardizem.  On Eliquis anticoagulation

## 2023-10-19 NOTE — Assessment & Plan Note (Signed)
Stable, chronic.  Continue current medication.  Well-controlled on losartan 50 mg daily, Cardizem CD 240 mg daily, clonidine 0.1 mg tablet daily and bisoprolol 10 mg p.o. daily

## 2023-10-19 NOTE — Progress Notes (Signed)
Patient ID: Amanda Estrada, female    DOB: [birth year], 62 y.o.   MRN: [id number]  This visit was conducted in person.  BP 128/74   Pulse 79   Temp 98.5 F (36.9 C) (Oral)   Ht 5\' 4"  (1.626 m)   Wt 153 lb 6.4 oz (69.6 kg)   SpO2 98%   BMI 26.33 kg/m    CC:  Chief Complaint  Patient presents with   Annual Exam    Subjective:   HPI: Amanda Estrada is a 62 y.o. female presenting on 10/19/2023 for Annual Exam  The patient presents for  complete physical and review of chronic health problems. He/She also has the following acute concerns today:  focal vertebral ttp, ongoing x 3 months... pain when standing or walking.  No falls. S/P triple bypass in 06/2022.. found during ablation.  Atrial fibrillation with RVR: Rate controlled with amiodarone and Cardizem.  On Eliquis anticoagulation    She has been losing weight on Zepbound 10 mg weekly .. has lost 30 lbs. Body mass index is 26.33 kg/m.  Elevated Cholesterol: LDL at goal on rosuvastatin 40 mg daily Lab Results  Component Value Date   CHOL 167 10/12/2023   HDL 103.40 10/12/2023   LDLCALC 52 10/12/2023   LDLDIRECT 81.0 10/16/2010   TRIG 55.0 10/12/2023   CHOLHDL 2 10/12/2023  Using medications without problems: Muscle aches:  Diet compliance: good Exercise: CVD reheab Other complaints:  Hypertension:  Well-controlled on losartan 50 mg daily, Cardizem CD 240 mg daily, clonidine 0.1 mg tablet daily and bisoprolol 10 mg p.o. daily BP Readings from Last 3 Encounters:  10/19/23 128/74  05/25/23 124/62  12/17/22 122/60  Using medication without problems or lightheadedness:  occ Chest pain with exertion: none Edema:none Short of breath: occ Average home BPs: Other issues:  Obstructive sleep apnea: not using CPAP.Marland Kitchen not snoring after weight loss.   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 now on  omeprazole only once daily.. per Danis.  Relevant past medical, surgical, family and social history reviewed and updated as indicated. Interim medical history since our last visit reviewed. Allergies and medications reviewed and updated. Outpatient Medications Prior to Visit  Medication Sig Dispense Refill   albuterol (VENTOLIN HFA) 108 (90 Base) MCG/ACT inhaler Inhale 2 puffs into the lungs every 4 (four) hours as needed for wheezing or shortness of breath. 8 g 2   apixaban (ELIQUIS) 5 MG TABS tablet Take 1 tablet (5 mg total) by mouth 2 (two) times daily. 60 tablet 11   bisoprolol (ZEBETA) 10 MG tablet Take 1 tablet (10 mg total) by mouth daily. 90 tablet 3   cloNIDine (CATAPRES) 0.1 MG tablet TAKE 1 TABLET BY MOUTH EVERYDAY AT BEDTIME 90 tablet 1   diltiazem (CARDIZEM CD) 240 MG 24 hr capsule TAKE 1 CAPSULE BY MOUTH EVERY DAY 90 capsule 3   fluticasone (FLONASE) 50 MCG/ACT nasal spray PLACE 1 SPRAY INTO BOTH NOSTRILS DAILY AS NEEDED FOR ALLERGIES. 48 mL 0   levocetirizine (XYZAL) 5 MG tablet Take 5 mg by mouth at bedtime.      losartan (COZAAR) 100 MG tablet Take 1 tablet (100 mg total) by mouth daily. 90 tablet 1   montelukast (SINGULAIR) 10 MG tablet TAKE 1 TABLET BY MOUTH EVERYDAY AT BEDTIME 90 tablet 0   omeprazole (PRILOSEC) 40 MG capsule TAKE 1 CAPSULE BY MOUTH TWICE A  DAY 180 capsule 0   ondansetron (ZOFRAN) 4 MG tablet TAKE 1 TABLET BY MOUTH EVERY 8 HOURS AS NEEDED FOR NAUSEA AND VOMITING 20 tablet 0   tirzepatide (ZEPBOUND) 10 MG/0.5ML Pen INJECT 10 MG INTO THE SKIN ONE TIME PER WEEK 6 mL 0   lisinopril (ZESTRIL) 20 MG tablet Take 20 mg by mouth daily.     rosuvastatin (CRESTOR) 40 MG tablet Take 40 mg by mouth daily.     No facility-administered medications prior to visit.     Per HPI unless specifically indicated in ROS section below Review of Systems  Constitutional:  Negative for fatigue and fever.  HENT:  Negative for congestion.   Eyes:  Negative for pain.  Respiratory:   Negative for cough and shortness of breath.   Cardiovascular:  Negative for chest pain, palpitations and leg swelling.  Gastrointestinal:  Negative for abdominal pain.  Genitourinary:  Negative for dysuria and vaginal bleeding.  Musculoskeletal:  Negative for back pain.  Neurological:  Negative for syncope, light-headedness and headaches.  Psychiatric/Behavioral:  Negative for dysphoric mood.    Objective:  BP 128/74   Pulse 79   Temp 98.5 F (36.9 C) (Oral)   Ht 5\' 4"  (1.626 m)   Wt 153 lb 6.4 oz (69.6 kg)   SpO2 98%   BMI 26.33 kg/m   Wt Readings from Last 3 Encounters:  10/19/23 153 lb 6.4 oz (69.6 kg)  05/25/23 170 lb (77.1 kg)  12/20/22 194 lb 4.8 oz (88.1 kg)      Physical Exam Constitutional:      General: She is not in acute distress.    Appearance: Normal appearance. She is well-developed. She is not ill-appearing or toxic-appearing.  HENT:     Head: Normocephalic.     Right Ear: Hearing, tympanic membrane, ear canal and external ear normal. Tympanic membrane is not erythematous, retracted or bulging.     Left Ear: Hearing, tympanic membrane, ear canal and external ear normal. Tympanic membrane is not erythematous, retracted or bulging.     Nose: No mucosal edema or rhinorrhea.     Right Sinus: No maxillary sinus tenderness or frontal sinus tenderness.     Left Sinus: No maxillary sinus tenderness or frontal sinus tenderness.     Mouth/Throat:     Pharynx: Uvula midline.  Eyes:     General: Lids are normal. Lids are everted, no foreign bodies appreciated.     Conjunctiva/sclera: Conjunctivae normal.     Pupils: Pupils are equal, round, and reactive to light.  Neck:     Thyroid: No thyroid mass or thyromegaly.     Vascular: No carotid bruit.     Trachea: Trachea normal.  Cardiovascular:     Rate and Rhythm: Normal rate and regular rhythm.     Pulses: Normal pulses.     Heart sounds: Normal heart sounds, S1 normal and S2 normal. No murmur heard.    No friction  rub. No gallop.  Pulmonary:     Effort: Pulmonary effort is normal. No tachypnea or respiratory distress.     Breath sounds: Normal breath sounds. No decreased breath sounds, wheezing, rhonchi or rales.  Abdominal:     General: Bowel sounds are normal.     Palpations: Abdomen is soft.     Tenderness: There is no abdominal tenderness.  Musculoskeletal:     Cervical back: Normal range of motion and neck supple.  Skin:    General: Skin is warm and dry.  Findings: No rash.  Neurological:     Mental Status: She is alert.  Psychiatric:        Mood and Affect: Mood is not anxious or depressed.        Speech: Speech normal.        Behavior: Behavior normal. Behavior is cooperative.        Thought Content: Thought content normal.        Judgment: Judgment normal.       Results for orders placed or performed in visit on 10/12/23  Comprehensive metabolic panel   Collection Time: 10/12/23  9:21 AM  Result Value Ref Range   Sodium 135 135 - 145 mEq/L   Potassium 4.3 3.5 - 5.1 mEq/L   Chloride 101 96 - 112 mEq/L   CO2 26 19 - 32 mEq/L   Glucose, Bld 87 70 - 99 mg/dL   BUN 13 6 - 23 mg/dL   Creatinine, Ser 9.14 0.40 - 1.20 mg/dL   Total Bilirubin 0.5 0.2 - 1.2 mg/dL   Alkaline Phosphatase 62 39 - 117 U/L   AST 25 0 - 37 U/L   ALT 22 0 - 35 U/L   Total Protein 6.7 6.0 - 8.3 g/dL   Albumin 4.3 3.5 - 5.2 g/dL   GFR 78.29 >56.21 mL/min   Calcium 9.9 8.4 - 10.5 mg/dL  Lipid panel   Collection Time: 10/12/23  9:21 AM  Result Value Ref Range   Cholesterol 167 0 - 200 mg/dL   Triglycerides 30.8 0.0 - 149.0 mg/dL   HDL 657.84 >69.62 mg/dL   VLDL 95.2 0.0 - 84.1 mg/dL   LDL Cholesterol 52 0 - 99 mg/dL   Total CHOL/HDL Ratio 2    NonHDL 63.38      COVID 19 screen:  No recent travel or known exposure to COVID19 The patient denies respiratory symptoms of COVID 19 at this time. The importance of social distancing was discussed today.   Assessment and Plan   The patient's  preventative maintenance and recommended screening tests for an annual wellness exam were reviewed in full today. Brought up to date unless services declined.  Counselled on the importance of diet, exercise, and its role in overall health and mortality. The patient's FH and SH was reviewed, including their home life, tobacco status, and drug and alcohol status.   Vaccines: Uptodate tdap,  COVID x 2,   consider shingrix and pneumonia vaccine,  refused flu Pap/DVE:  06/2021, nml pap, neg HPV, repeat  In 5 years. No family history of ovarian and uterine cancer Mammo:  7/. Plan repeat in 1 year. Bone Density: plan age 52 given mother's history.... due. Colon:  1/ 2020 , Dr. Valentino Saxon s, plan q 5 year, family history of colon ca in Mom and Dad.  Smoking Status: nonsmoker ETOH/ drug use: wine 1 glass daily/none  Hep C:  done  HIV screen:   Refused.  Problem List Items Addressed This Visit     Acute midline thoracic back pain   Relevant Orders   DG Thoracic Spine W/Swimmers (Completed)   Atrial fibrillation with RVR (HCC)   Chronic,  Followed by cardiology Rate controlled with amiodarone and Cardizem.  On Eliquis anticoagulation      Class 1 drug-induced obesity with serious comorbidity and body mass index (BMI) of 32.0 to 32.9 in adult   Encouraged exercise, weight loss, healthy eating habits.       Essential hypertension   Stable, chronic.  Continue current medication.  Well-controlled on losartan 50 mg daily, Cardizem CD 240 mg daily, clonidine 0.1 mg tablet daily and bisoprolol 10 mg p.o. daily      Pure hypercholesterolemia   Stable, chronic.  Continue current medication.  Rosuvastatin 40 mg daily      Other Visit Diagnoses       Routine general medical examination at a health care facility    -  Primary     Post-menopausal       Relevant Orders   DG Bone Density       Meds ordered this encounter  Medications   DISCONTD: tirzepatide (MOUNJARO) 12.5 MG/0.5ML Pen     Sig: Inject 12.5 mg into the skin once a week.    Dispense:  6 mL    Refill:  3     Amanda Nora, MD

## 2023-10-19 NOTE — Assessment & Plan Note (Signed)
Encouraged exercise, weight loss, healthy eating habits. ? ?

## 2023-10-20 MED ORDER — ZEPBOUND 12.5 MG/0.5ML ~~LOC~~ SOAJ: 12.5000 mg | SUBCUTANEOUS | 3 refills | Status: DC

## 2023-10-21 ENCOUNTER — Ambulatory Visit (INDEPENDENT_AMBULATORY_CARE_PROVIDER_SITE_OTHER): Payer: BC Managed Care – PPO

## 2023-10-21 ENCOUNTER — Encounter: Payer: Self-pay | Admitting: Podiatry

## 2023-10-21 ENCOUNTER — Ambulatory Visit: Payer: BC Managed Care – PPO | Admitting: Podiatry

## 2023-10-21 DIAGNOSIS — M778 Other enthesopathies, not elsewhere classified: Secondary | ICD-10-CM

## 2023-10-21 DIAGNOSIS — M7661 Achilles tendinitis, right leg: Secondary | ICD-10-CM | POA: Diagnosis not present

## 2023-10-21 MED ORDER — TRIAMCINOLONE ACETONIDE 10 MG/ML IJ SUSP
10.0000 mg | Freq: Once | INTRAMUSCULAR | Status: AC
  Administered 2023-10-21: 10 mg via INTRA_ARTICULAR

## 2023-10-24 ENCOUNTER — Encounter: Payer: Self-pay | Admitting: Family Medicine

## 2023-10-24 NOTE — Progress Notes (Signed)
Subjective:   Patient ID: Amanda Estrada, female   DOB: 62 y.o.   MRN: [id number]   HPI Patient presents stating the pain has gotten worse again on the back of her right heel and it started over about the last 4 months and she did have relief for a period of time from previous treatment   ROS      Objective:  Physical Exam  Neurovascular status intact enlargement around the right posterior medial heel at the insertion calcaneus with minimal discomfort lateral and patient getting ready to go on several big trips desperate for short-term relief     Assessment:  Achilles tendinitis right with acute inflammation medial side it did well for at least 6 to 8 months     Plan:  H&P reviewed discussed injection and the risk she wants to go this route and hopefully will be better for her trip and will eventually may require surgery.  At this point I went ahead I did sterile prep I carefully injected the lateral side 3 mg dexamethasone Kenalog 5 mg Xylocaine and I then went ahead and instructed on wearing her air fracture walker full-time for the next few weeks.  Reappoint when she returns from [place] on her cruise on this overseas  X-rays indicate large spur formation no increased density when compared to previous visit

## 2023-10-25 NOTE — Telephone Encounter (Signed)
Pt called stating [facility] is requiring a referral from Dr. Ermalene Searing. Call back # [phone]

## 2023-10-25 NOTE — Telephone Encounter (Signed)
I have already placed a referral at the time patient was in the office.  Please let me know if it was placed incorrectly or fax information as requested

## 2023-10-26 NOTE — Telephone Encounter (Signed)
This is for a Bone Density, those are printed and faxed by the CMA in the office.   I do not handle Annual Screening Mammograms or Bone Densities. Thanks!

## 2023-10-26 NOTE — Telephone Encounter (Signed)
I do not see that an order was placed for the Bone Density.  Please place order and send back to me to fax to [facility].

## 2023-11-03 ENCOUNTER — Ambulatory Visit: Payer: Self-pay | Admitting: Family Medicine

## 2023-11-03 DIAGNOSIS — Z780 Asymptomatic menopausal state: Secondary | ICD-10-CM | POA: Diagnosis not present

## 2023-11-03 DIAGNOSIS — M859 Disorder of bone density and structure, unspecified: Secondary | ICD-10-CM | POA: Diagnosis not present

## 2023-11-03 DIAGNOSIS — M8589 Other specified disorders of bone density and structure, multiple sites: Secondary | ICD-10-CM | POA: Diagnosis not present

## 2023-11-03 LAB — HM DEXA SCAN

## 2023-11-03 NOTE — Telephone Encounter (Signed)
Copied from CRM [id number]. Topic: Clinical - Pink Word Triage >> Nov 03, 2023  4:05 PM Amanda Estrada wrote: Reason for Triage: pt called stating she has a possible UTI because she feels pressure like she has to go but does not   Chief Complaint: Frequency, Dribbling Symptoms: Frequency, Urgency, Dribbling Frequency: 4 days Pertinent Negatives: Patient denies burning, hematuria, back pain, or itching Disposition: [] ED /[] Urgent Care (no appt availability in office) / [x] Appointment(In office/virtual)/ []  [facility]/ [] Home Care/ [] Refused Recommended Disposition /[] [facility]/ []  Follow-up with PCP Additional Notes: Amanda Estrada is a 62 year old female being triaged for urinary symptoms. The patient reports dribbling and frequency when she does go to the bathroom. The patient reports taking Azo with no relief of symptoms. The patient plans to leave to go out of town on tomorrow and is scheduled for a video visit.      Reason for Disposition  Bad or foul-smelling urine  Answer Assessment - Initial Assessment Questions 1. SYMPTOM: "What's the main symptom you're concerned about?" (e.g., frequency, incontinence)     Frequency, Dribbling 2. ONSET: "When did the  symptoms  start?"     4 days 3. PAIN: "Is there any pain?" If Yes, ask: "How bad is it?" (Scale: 1-10; mild, moderate, severe)     Denies pain 4. CAUSE: "What do you think is causing the symptoms?"     Unsure 5. OTHER SYMPTOMS: "Do you have any other symptoms?" (e.g., blood in urine, fever, flank pain, pain with urination)     No 6. PREGNANCY: "Is there any chance you are pregnant?" "When was your last menstrual period?"     No  Protocols used: Urinary Symptoms-A-AH

## 2023-11-04 ENCOUNTER — Ambulatory Visit: Payer: BC Managed Care – PPO | Admitting: Nurse Practitioner

## 2023-11-04 ENCOUNTER — Encounter: Payer: Self-pay | Admitting: Family Medicine

## 2023-11-04 NOTE — Telephone Encounter (Signed)
Noted patient was scheduled for a virtual visit with a urinary complaint. She was called and asked to switch to in person and declined

## 2023-11-06 ENCOUNTER — Encounter: Payer: Self-pay | Admitting: Family Medicine

## 2023-11-07 NOTE — Telephone Encounter (Signed)
Amanda Estrada notified as instructed by telephone.  She states she still can't stand for a long period of time.  She states the pain will start in her back and then move to her ribs.  She has been using Aleve and tylenol for pain and will ease her off for a little bit then comes right back.  She is leaving for [place] Jan 1 and will be gone for 3 weeks.  She would like to try the Tramadol to take on her trip.  [facility].

## 2023-11-07 NOTE — Telephone Encounter (Signed)
Noted, agree that appointment for UTI does need to be in person for sample collection.

## 2023-11-08 ENCOUNTER — Other Ambulatory Visit: Payer: Self-pay | Admitting: Family Medicine

## 2023-11-08 MED ORDER — TRAMADOL HCL 50 MG PO TABS: 50.0000 mg | ORAL_TABLET | Freq: Three times a day (TID) | ORAL | 0 refills | Status: AC | PRN

## 2023-11-23 ENCOUNTER — Encounter: Payer: Self-pay | Admitting: Gastroenterology

## 2023-11-25 ENCOUNTER — Other Ambulatory Visit: Payer: Self-pay | Admitting: Family Medicine

## 2023-12-02 ENCOUNTER — Other Ambulatory Visit: Payer: Self-pay | Admitting: Family Medicine

## 2023-12-05 ENCOUNTER — Other Ambulatory Visit (HOSPITAL_COMMUNITY): Payer: Self-pay

## 2023-12-16 ENCOUNTER — Ambulatory Visit: Payer: BC Managed Care – PPO | Admitting: Podiatry

## 2023-12-23 DIAGNOSIS — N390 Urinary tract infection, site not specified: Secondary | ICD-10-CM | POA: Diagnosis not present

## 2024-01-02 ENCOUNTER — Telehealth: Payer: Self-pay | Admitting: Family Medicine

## 2024-01-02 ENCOUNTER — Ambulatory Visit: Payer: BC Managed Care – PPO | Admitting: Family Medicine

## 2024-01-02 ENCOUNTER — Ambulatory Visit: Payer: Self-pay | Admitting: Family Medicine

## 2024-01-02 ENCOUNTER — Encounter: Payer: Self-pay | Admitting: Family Medicine

## 2024-01-02 VITALS — BP 136/76 | HR 71 | Temp 97.9°F | Ht 64.0 in | Wt 154.5 lb

## 2024-01-02 DIAGNOSIS — R350 Frequency of micturition: Secondary | ICD-10-CM

## 2024-01-02 DIAGNOSIS — N3000 Acute cystitis without hematuria: Secondary | ICD-10-CM

## 2024-01-02 LAB — POC URINALSYSI DIPSTICK (AUTOMATED)
Bilirubin, UA: NEGATIVE
Blood, UA: NEGATIVE
Glucose, UA: NEGATIVE
Ketones, UA: NEGATIVE
Nitrite, UA: NEGATIVE
Protein, UA: NEGATIVE
Spec Grav, UA: 1.015 (ref 1.010–1.025)
Urobilinogen, UA: 0.2 U/dL
pH, UA: 6.5 (ref 5.0–8.0)

## 2024-01-02 MED ORDER — CEPHALEXIN 500 MG PO CAPS: 500.0000 mg | ORAL_CAPSULE | Freq: Two times a day (BID) | ORAL | 0 refills | Status: DC

## 2024-01-02 NOTE — Telephone Encounter (Signed)
 Please call and schedule office visit for UTI.

## 2024-01-02 NOTE — Progress Notes (Signed)
 Ph: [phone] Fax: [phone]   Patient ID: Amanda Estrada, female    DOB: 15-May-1961, 64 y.o.   MRN: [id number]  This visit was conducted in person.  BP 136/76   Pulse 71   Temp 97.9 F (36.6 C) (Oral)   Ht 5\' 4"  (1.626 m)   Wt 154 lb 8 oz (70.1 kg)   SpO2 93%   BMI 26.52 kg/m    CC: UTI Subjective:   HPI: Amanda Estrada is a 63 y.o. female presenting on 01/02/2024 for Urinary Frequency (C/o urinary frequency and low abd pressure. Sxs started 12/31/23.)   2d h/o lower abd pressure, lower back discomfort, urgency, frequency, incomplete emptying.   No fevers/chills, nausea/ vomiting, hematuria, flank pain, or dysuria.   Had UTI symptoms since late 10/2023, self-treated with 4 boxes of Azo.   Seen at [facility] at the beach 10d ago with blood and LE in urine noted, treated for UTI with macrobid 7d course. UCx at that time negative for infection.   She notes she tends to get frequent UTIs.  No h/o interstitial cystitis.  Recent trip to [place].     Relevant past medical, surgical, family and social history reviewed and updated as indicated. Interim medical history since our last visit reviewed. Allergies and medications reviewed and updated. Outpatient Medications Prior to Visit  Medication Sig Dispense Refill   albuterol (VENTOLIN HFA) 108 (90 Base) MCG/ACT inhaler Inhale 2 puffs into the lungs every 4 (four) hours as needed for wheezing or shortness of breath. 8 g 2   apixaban (ELIQUIS) 5 MG TABS tablet Take 1 tablet (5 mg total) by mouth 2 (two) times daily. 60 tablet 11   bisoprolol (ZEBETA) 10 MG tablet Take 1 tablet (10 mg total) by mouth daily. 90 tablet 3   cloNIDine (CATAPRES) 0.1 MG tablet TAKE 1 TABLET BY MOUTH EVERYDAY AT BEDTIME 90 tablet 1   diltiazem (CARDIZEM CD) 240 MG 24 hr capsule TAKE 1 CAPSULE BY MOUTH EVERY DAY 90 capsule 3   fluticasone (FLONASE) 50 MCG/ACT nasal spray PLACE 1 SPRAY INTO BOTH NOSTRILS DAILY AS NEEDED FOR ALLERGIES. 48 mL 1    levocetirizine (XYZAL) 5 MG tablet Take 5 mg by mouth at bedtime.      losartan (COZAAR) 100 MG tablet Take 1 tablet (100 mg total) by mouth daily. 90 tablet 1   montelukast (SINGULAIR) 10 MG tablet TAKE 1 TABLET BY MOUTH EVERYDAY AT BEDTIME 90 tablet 3   omeprazole (PRILOSEC) 40 MG capsule TAKE 1 CAPSULE BY MOUTH TWICE A DAY 180 capsule 3   ondansetron (ZOFRAN) 4 MG tablet TAKE 1 TABLET BY MOUTH EVERY 8 HOURS AS NEEDED FOR NAUSEA AND VOMITING 20 tablet 0   ZEPBOUND 12.5 MG/0.5ML Pen Inject 12.5 mg into the skin once a week. 6 mL 3   tirzepatide (ZEPBOUND) 10 MG/0.5ML Pen INJECT 10 MG INTO THE SKIN ONE TIME PER WEEK 6 mL 0   rosuvastatin (CRESTOR) 40 MG tablet Take 40 mg by mouth daily.     No facility-administered medications prior to visit.     Per HPI unless specifically indicated in ROS section below Review of Systems  Objective:  BP 136/76   Pulse 71   Temp 97.9 F (36.6 C) (Oral)   Ht 5\' 4"  (1.626 m)   Wt 154 lb 8 oz (70.1 kg)   SpO2 93%   BMI 26.52 kg/m   Wt Readings from Last 3 Encounters:  01/02/24 154 lb 8 oz (  70.1 kg)  10/19/23 153 lb 6.4 oz (69.6 kg)  05/25/23 170 lb (77.1 kg)      Physical Exam Vitals and nursing note reviewed.  Constitutional:      Appearance: Normal appearance. She is not ill-appearing.  Abdominal:     General: Bowel sounds are normal. There is no distension.     Palpations: Abdomen is soft. There is no mass.     Tenderness: There is abdominal tenderness (moderate) in the suprapubic area. There is no right CVA tenderness, left CVA tenderness, guarding or rebound. Negative signs include Murphy's sign.     Hernia: No hernia is present.  Musculoskeletal:     Right lower leg: No edema.     Left lower leg: No edema.  Skin:    General: Skin is warm and dry.     Findings: No rash.  Neurological:     Mental Status: She is alert.  Psychiatric:        Mood and Affect: Mood normal.        Behavior: Behavior normal.       Results for orders  placed or performed in visit on 01/02/24  POCT Urinalysis Dipstick (Automated)   Collection Time: 01/02/24  2:36 PM  Result Value Ref Range   Color, UA yellow    Clarity, UA clear    Glucose, UA Negative Negative   Bilirubin, UA negative    Ketones, UA negative    Spec Grav, UA 1.015 1.010 - 1.025   Blood, UA negative    pH, UA 6.5 5.0 - 8.0   Protein, UA Negative Negative   Urobilinogen, UA 0.2 0.2 or 1.0 E.U./dL   Nitrite, UA negative    Leukocytes, UA Large (3+) (A) Negative    Assessment & Plan:   Problem List Items Addressed This Visit     UTI (urinary tract infection) - Primary   Anticipate recurrent UTI  incompletely treated with prior macrobid course.  She has PCN intolerance - diarrhea. Will Rx Keflex 500mg  BID x7d course while we await UCx results.  Reviewed limiting bladder irritants, increasing water intake.       Relevant Medications   cephALEXin (KEFLEX) 500 MG capsule   Other Relevant Orders   Urine Culture   Other Visit Diagnoses       Urinary frequency       Relevant Orders   POCT Urinalysis Dipstick (Automated) (Completed)        Meds ordered this encounter  Medications   cephALEXin (KEFLEX) 500 MG capsule    Sig: Take 1 capsule (500 mg total) by mouth 2 (two) times daily.    Dispense:  14 capsule    Refill:  0    Orders Placed This Encounter  Procedures   Urine Culture   POCT Urinalysis Dipstick (Automated)    Patient Instructions  Anticipate return of UTI. Urine culture sent again. Treat with antibiotic keflex 500mg  twice daily sent to pharmacy for 7 days. Push fluids and rest  Avoid bladder irritants like caffeine, dark sodas, spicy foods.  Let us know if not improving with treatment.   Follow up plan: Return if symptoms worsen or fail to improve.  Eustaquio Boyden, MD

## 2024-01-02 NOTE — Telephone Encounter (Signed)
 Triage called and stated that the patient refuse to go to the ER

## 2024-01-02 NOTE — Telephone Encounter (Signed)
 Copied from CRM [id number]. Topic: Clinical - Red Word Triage >> Jan 02, 2024 11:13 AM Florestine Avers wrote: Red Word that prompted transfer to Nurse Triage: Patient is calling in because she thinks she has a UTI. Patient states she went to a walk in clinic two weeks ago, she had blood in her urine in large amounts and she finished the antibiotic course but she feels like it came back again. She said it feels like a lot of pressure and frequent urination.   Chief Complaint: suspected urinary retention Symptoms: darker urine, only drops of urine >4 hours, "lot of pressure" and fullness to lower abdomen near pelvis Frequency: continual Pertinent Negatives: Patient denies visible blood in urine Disposition: [] 911 / [] ED /[] Urgent Care (no appt availability in office) / [] Appointment(In office/virtual)/ []  [facility]/ [] Home Care/ [x] Refused Recommended Disposition /[] [facility]/ []  Follow-up with PCP Additional Notes: Pt reporting recent UTI for which she was treated by UC, concerned UTI came back. Pt reporting "lot of pressure" in lower abdomen near pelvis and confirms feels full when touch lower abdomen. Pt confirms no full stream of urine since "middle of night," confirms only droplets of urine for over 4 hours. Advised pt go to ED for potential urinary retention. Pt refused "will go to walk-in, not going to ED, I don't go to ED," advised pt go to hospital since will have capability to relief retention if there, can cause significant issues if retention. Pt verbalized understanding, refusing ED, heading to UC. Nurse informed CAL.  Reason for Disposition  [1] Unable to urinate (or only a few drops) > 4 hours AND [2] bladder feels very full (e.g., palpable bladder or strong urge to urinate)  Answer Assessment - Initial Assessment Questions 1. SYMPTOM: "What's the main symptom you're concerned about?" (e.g., frequency, incontinence)     More like pressure, feeling lot of pressure in  lower abdomen near pelvis and feels full when touch lower abdomen, no burning or pain with urination, confirms no full stream of urine and only drops for over 4 hours now 5. OTHER SYMPTOMS: "Do you have any other symptoms?" (e.g., blood in urine, fever, flank pain, pain with urination)     No visible blood in urine, just darker color, test results came back with large amounts blood and positive for WBCs when UC 2 weeks ago  Protocols used: Urinary Symptoms-A-AH

## 2024-01-02 NOTE — Telephone Encounter (Signed)
 See Triage Note from today.  I have ask front office to call and schedule office visit for UTI.

## 2024-01-02 NOTE — Telephone Encounter (Signed)
 Patient scheduled to see Dr. Reece Agar this afternoon for possible UTI.

## 2024-01-02 NOTE — Telephone Encounter (Signed)
 Pt has  schedule a appt / office visit

## 2024-01-02 NOTE — Assessment & Plan Note (Signed)
 Anticipate recurrent UTI  incompletely treated with prior macrobid course.  She has PCN intolerance - diarrhea. Will Rx Keflex 500mg  BID x7d course while we await UCx results.  Reviewed limiting bladder irritants, increasing water intake.

## 2024-01-02 NOTE — Patient Instructions (Signed)
 Anticipate return of UTI. Urine culture sent again. Treat with antibiotic keflex 500mg  twice daily sent to pharmacy for 7 days. Push fluids and rest  Avoid bladder irritants like caffeine, dark sodas, spicy foods.  Let us know if not improving with treatment.

## 2024-01-03 NOTE — Telephone Encounter (Signed)
Noted, thank you for seeing her

## 2024-01-04 LAB — URINE CULTURE
MICRO NUMBER:: 16119944
SPECIMEN QUALITY:: ADEQUATE

## 2024-01-11 ENCOUNTER — Other Ambulatory Visit (HOSPITAL_COMMUNITY): Payer: Self-pay

## 2024-01-14 ENCOUNTER — Encounter: Payer: Self-pay | Admitting: Family Medicine

## 2024-01-17 MED ORDER — CEPHALEXIN 500 MG PO CAPS: 500.0000 mg | ORAL_CAPSULE | Freq: Two times a day (BID) | ORAL | 0 refills | Status: DC

## 2024-02-02 ENCOUNTER — Encounter: Payer: Self-pay | Admitting: Physician Assistant

## 2024-02-09 ENCOUNTER — Ambulatory Visit: Payer: BC Managed Care – PPO | Admitting: Gastroenterology

## 2024-02-16 DIAGNOSIS — M4854XA Collapsed vertebra, not elsewhere classified, thoracic region, initial encounter for fracture: Secondary | ICD-10-CM | POA: Diagnosis not present

## 2024-02-23 DIAGNOSIS — M4854XA Collapsed vertebra, not elsewhere classified, thoracic region, initial encounter for fracture: Secondary | ICD-10-CM | POA: Diagnosis not present

## 2024-02-27 ENCOUNTER — Ambulatory Visit: Payer: Self-pay

## 2024-02-27 ENCOUNTER — Other Ambulatory Visit: Payer: Self-pay | Admitting: Family Medicine

## 2024-02-27 ENCOUNTER — Other Ambulatory Visit

## 2024-02-27 DIAGNOSIS — R350 Frequency of micturition: Secondary | ICD-10-CM

## 2024-02-27 NOTE — Telephone Encounter (Signed)
 Spoke with Tanya Fantasia and schedule a lab only appointment this afternoon for UA/Micro and Culture.  Patient's mom died this morning and that is why she is unable to schedule an appointment at this time.  FYI to Dr. Cherlyn Cornet.

## 2024-02-27 NOTE — Telephone Encounter (Signed)
  Chief Complaint: Urinary frequency Symptoms: foul smelling urine, urinary frequency, constant kidney pressure Frequency: started a week ago Pertinent Negatives: Patient denies fever Disposition: [] ED /[] Urgent Care (no appt availability in office) / [] Appointment(In office/virtual)/ []  [facility]/ [] Home Care/ [] Refused Recommended Disposition /[] [facility]/ [x]  Follow-up with PCP Additional Notes: patient calling with concerns for urinary frequency, foul smelling urine and constant kidney pressure. Patient states she has been dealing with constant UTIs. Patient was last on antibiotics for UTI for a week starting on 01/17/2024. Patient reports that symptoms come back after she has finished the antibiotics. Patient is recommended to be seen in 24 hours with an appointment available tomorrow with PCP. Patient refused appointment due to availability. Patient states she has another appointment within an hour of the proposed appointment time with PCP. Patient is asking for PCP staff to call her to get her scheduled. Patient is also asking if she can come give a urine sample at the lab. Patient verbalized understanding of plan and all questions answered.    Copied from CRM [id number]. Topic: Clinical - Red Word Triage >> Feb 27, 2024 10:42 AM Dorthula Gavel H wrote: [facility] that prompted transfer to Nurse Triage: pt states she is having constant kidney pressure, foul smelling urine, and is only able to urinate a little bit despite feeling like she needs to go more. Also stated this has been on going for a few months and has been treated with multiple antibiotics Reason for Disposition  Urinating more frequently than usual (i.e., frequency)  Answer Assessment - Initial Assessment Questions 1. SYMPTOM: "What's the main symptom you're concerned about?" (e.g., frequency, incontinence)     Frequency, constant kidney pressure, foul smelling urine 2. ONSET: "When did the  frequency, constant  kidney pressure, foul smelling urine  start?"     Started a week ago-reports symptoms came back after finishing antibiotics 3. PAIN: "Is there any pain?" If Yes, ask: "How bad is it?" (Scale: 1-10; mild, moderate, severe)     6 out of 10 4. CAUSE: "What do you think is causing the symptoms?"     Possible UTI 5. OTHER SYMPTOMS: "Do you have any other symptoms?" (e.g., blood in urine, fever, flank pain, pain with urination)     No  Protocols used: Urinary Symptoms-A-AH

## 2024-02-27 NOTE — Telephone Encounter (Signed)
 Ok for her to drop of UA, MIcro, Culture to the lab.

## 2024-02-27 NOTE — Telephone Encounter (Signed)
 Noted.

## 2024-02-28 ENCOUNTER — Encounter: Payer: Self-pay | Admitting: Family Medicine

## 2024-02-28 DIAGNOSIS — M4854XA Collapsed vertebra, not elsewhere classified, thoracic region, initial encounter for fracture: Secondary | ICD-10-CM | POA: Diagnosis not present

## 2024-02-29 LAB — URINALYSIS, ROUTINE W REFLEX MICROSCOPIC
Bacteria, UA: NONE SEEN /HPF
Bilirubin Urine: NEGATIVE
Glucose, UA: NEGATIVE
Hgb urine dipstick: NEGATIVE
Hyaline Cast: NONE SEEN /LPF
Ketones, ur: NEGATIVE
Nitrite: NEGATIVE
Protein, ur: NEGATIVE
RBC / HPF: NONE SEEN /HPF (ref 0–2)
Specific Gravity, Urine: 1.021 (ref 1.001–1.035)
Squamous Epithelial / HPF: NONE SEEN /HPF (ref <=5)
pH: 6.5 (ref 5.0–8.0)

## 2024-02-29 LAB — URINE CULTURE
MICRO NUMBER:: 16353616
SPECIMEN QUALITY:: ADEQUATE

## 2024-03-01 ENCOUNTER — Encounter: Payer: Self-pay | Admitting: Family Medicine

## 2024-03-01 ENCOUNTER — Ambulatory Visit: Admitting: Family Medicine

## 2024-03-01 VITALS — BP 110/60 | HR 82 | Temp 98.2°F | Ht 64.0 in | Wt 149.2 lb

## 2024-03-01 DIAGNOSIS — N10 Acute tubulo-interstitial nephritis: Secondary | ICD-10-CM | POA: Diagnosis not present

## 2024-03-01 DIAGNOSIS — E042 Nontoxic multinodular goiter: Secondary | ICD-10-CM | POA: Diagnosis not present

## 2024-03-01 DIAGNOSIS — R937 Abnormal findings on diagnostic imaging of other parts of musculoskeletal system: Secondary | ICD-10-CM

## 2024-03-01 MED ORDER — CIPROFLOXACIN HCL 500 MG PO TABS: 500.0000 mg | ORAL_TABLET | Freq: Two times a day (BID) | ORAL | 0 refills | Status: AC

## 2024-03-01 NOTE — Progress Notes (Signed)
 Patient ID: Amanda Estrada, female    DOB: [birth year], 63 y.o.   MRN: [id number]  This visit was conducted in person.  BP 110/60 (BP Location: Right Arm, Patient Position: Sitting, Cuff Size: Normal)   Pulse 82   Temp 98.2 F (36.8 C) (Temporal)   Ht 5\' 4"  (1.626 m)   Wt 149 lb 4 oz (67.7 kg)   SpO2 98%   BMI 25.62 kg/m    CC:  Chief Complaint  Patient presents with   Results    Discuss MRI results   Urinary Tract Infection    Subjective:   HPI: Amanda Estrada is a 63 y.o. female presenting on 03/01/2024 for Results (Discuss MRI results) and Urinary Tract Infection  Urine culture returned showing pan sensitive Ecoli.  Having urinary frequency, dysuria for several  months  No fever. No blood in urine Has pain in right CVA.   Has recently been treated for recurrent UTI with 2 courses of antibiotics... symptoms never really resolved.   01/02/2024 Ecoli Treated with cephalexin  x 14 days total. Urgent Care. 11/2023 ? What antibiotic Symptoms get a little better, but then returns.  Also seeing  [facility] for back several months ago... mid back pain, radiating to right side if standing a while  MRI thoracic spine:  Chronic compression fracture, mild stenosis.  Incidentally noted:  thyroid  nodules   Also noted changes  lightheaded at time, mild memory Changes ? Low signal in  B frontal lobes and corpus callosum... rec MRI   Relevant past medical, surgical, family and social history reviewed and updated as indicated. Interim medical history since our last visit reviewed. Allergies and medications reviewed and updated. Outpatient Medications Prior to Visit  Medication Sig Dispense Refill   albuterol  (VENTOLIN  HFA) 108 (90 Base) MCG/ACT inhaler Inhale 2 puffs into the lungs every 4 (four) hours as needed for wheezing or shortness of breath. 8 g 2   apixaban  (ELIQUIS ) 5 MG TABS tablet Take 1 tablet (5 mg total) by mouth 2 (two) times daily. 60 tablet 11   bisoprolol   (ZEBETA ) 10 MG tablet Take 1 tablet (10 mg total) by mouth daily. 90 tablet 3   cloNIDine  (CATAPRES ) 0.1 MG tablet TAKE 1 TABLET BY MOUTH EVERYDAY AT BEDTIME 90 tablet 1   diltiazem  (CARDIZEM  CD) 240 MG 24 hr capsule TAKE 1 CAPSULE BY MOUTH EVERY DAY 90 capsule 3   fluticasone  (FLONASE ) 50 MCG/ACT nasal spray PLACE 1 SPRAY INTO BOTH NOSTRILS DAILY AS NEEDED FOR ALLERGIES. 48 mL 1   levocetirizine (XYZAL) 5 MG tablet Take 5 mg by mouth at bedtime.      losartan  (COZAAR ) 100 MG tablet Take 1 tablet (100 mg total) by mouth daily. 90 tablet 1   montelukast  (SINGULAIR ) 10 MG tablet TAKE 1 TABLET BY MOUTH EVERYDAY AT BEDTIME 90 tablet 3   omeprazole  (PRILOSEC) 40 MG capsule TAKE 1 CAPSULE BY MOUTH TWICE A DAY 180 capsule 3   rosuvastatin  (CRESTOR ) 40 MG tablet Take 40 mg by mouth daily.     ZEPBOUND  12.5 MG/0.5ML Pen Inject 12.5 mg into the skin once a week. 6 mL 3   cephALEXin  (KEFLEX ) 500 MG capsule Take 1 capsule (500 mg total) by mouth 2 (two) times daily. 14 capsule 0   ondansetron  (ZOFRAN ) 4 MG tablet TAKE 1 TABLET BY MOUTH EVERY 8 HOURS AS NEEDED FOR NAUSEA AND VOMITING 20 tablet 0   No facility-administered medications prior to visit.     Per  HPI unless specifically indicated in ROS section below Review of Systems  Constitutional:  Positive for fatigue. Negative for fever.  HENT:  Negative for congestion.   Eyes:  Negative for pain.  Respiratory:  Negative for cough and shortness of breath.   Cardiovascular:  Negative for chest pain, palpitations and leg swelling.  Gastrointestinal:  Negative for abdominal pain.  Genitourinary:  Negative for dysuria and vaginal bleeding.  Musculoskeletal:  Negative for back pain.  Neurological:  Positive for light-headedness. Negative for syncope and headaches.  Psychiatric/Behavioral:  Negative for dysphoric mood.    Objective:  BP 110/60 (BP Location: Right Arm, Patient Position: Sitting, Cuff Size: Normal)   Pulse 82   Temp 98.2 F (36.8 C)  (Temporal)   Ht 5\' 4"  (1.626 m)   Wt 149 lb 4 oz (67.7 kg)   SpO2 98%   BMI 25.62 kg/m   Wt Readings from Last 3 Encounters:  03/01/24 149 lb 4 oz (67.7 kg)  01/02/24 154 lb 8 oz (70.1 kg)  10/19/23 153 lb 6.4 oz (69.6 kg)      Physical Exam Constitutional:      General: She is not in acute distress.    Appearance: Normal appearance. She is well-developed. She is not ill-appearing or toxic-appearing.  HENT:     Head: Normocephalic.     Right Ear: Hearing, tympanic membrane, ear canal and external ear normal. Tympanic membrane is not erythematous, retracted or bulging.     Left Ear: Hearing, tympanic membrane, ear canal and external ear normal. Tympanic membrane is not erythematous, retracted or bulging.     Nose: No mucosal edema or rhinorrhea.     Right Sinus: No maxillary sinus tenderness or frontal sinus tenderness.     Left Sinus: No maxillary sinus tenderness or frontal sinus tenderness.     Mouth/Throat:     Pharynx: Uvula midline.  Eyes:     General: Lids are normal. Lids are everted, no foreign bodies appreciated.     Conjunctiva/sclera: Conjunctivae normal.     Pupils: Pupils are equal, round, and reactive to light.  Neck:     Thyroid : No thyroid  mass or thyromegaly.     Vascular: No carotid bruit.     Trachea: Trachea normal.  Cardiovascular:     Rate and Rhythm: Normal rate and regular rhythm.     Pulses: Normal pulses.     Heart sounds: Normal heart sounds, S1 normal and S2 normal. No murmur heard.    No friction rub. No gallop.  Pulmonary:     Effort: Pulmonary effort is normal. No tachypnea or respiratory distress.     Breath sounds: Normal breath sounds. No decreased breath sounds, wheezing, rhonchi or rales.  Abdominal:     General: Bowel sounds are normal.     Palpations: Abdomen is soft.     Tenderness: There is no abdominal tenderness.  Musculoskeletal:     Cervical back: Normal range of motion and neck supple.  Skin:    General: Skin is warm and  dry.     Findings: No rash.  Neurological:     Mental Status: She is alert.  Psychiatric:        Mood and Affect: Mood is not anxious or depressed.        Speech: Speech normal.        Behavior: Behavior normal. Behavior is cooperative.        Thought Content: Thought content normal.        Judgment:  Judgment normal.       Results for orders placed or performed in visit on 02/27/24  Urine Culture   Collection Time: 02/27/24  3:53 PM   Specimen: Urine  Result Value Ref Range   MICRO NUMBER: [id number]    SPECIMEN QUALITY: Adequate    Sample Source URINE    STATUS: FINAL    ISOLATE 1: Escherichia coli (A)       Susceptibility   Escherichia coli - URINE CULTURE, REFLEX    AMOX/CLAVULANIC <=2 Sensitive     AMPICILLIN <=2 Sensitive     AMPICILLIN/SULBACTAM <=2 Sensitive     CEFAZOLIN* <=4 Not Reportable      * For infections other than uncomplicated UTI caused by E. coli, K. pneumoniae or P. mirabilis: Cefazolin is resistant if MIC > or = 8 mcg/mL. (Distinguishing susceptible versus intermediate for isolates with MIC < or = 4 mcg/mL requires additional testing.) For uncomplicated UTI caused by E. coli, K. pneumoniae or P. mirabilis: Cefazolin is susceptible if MIC <32 mcg/mL and predicts susceptible to the oral agents cefaclor, cefdinir, cefpodoxime, cefprozil, cefuroxime, cephalexin  and loracarbef.     CEFTAZIDIME <=1 Sensitive     CEFEPIME <=1 Sensitive     CEFTRIAXONE <=1 Sensitive     CIPROFLOXACIN  <=0.25 Sensitive     LEVOFLOXACIN  <=0.12 Sensitive     GENTAMICIN <=1 Sensitive     IMIPENEM <=0.25 Sensitive     NITROFURANTOIN  <=16 Sensitive     PIP/TAZO <=4 Sensitive     TOBRAMYCIN <=1 Sensitive     TRIMETH /SULFA * <=20 Sensitive      * For infections other than uncomplicated UTI caused by E. coli, K. pneumoniae or P. mirabilis: Cefazolin is resistant if MIC > or = 8 mcg/mL. (Distinguishing susceptible versus intermediate for isolates with MIC < or = 4 mcg/mL  requires additional testing.) For uncomplicated UTI caused by E. coli, K. pneumoniae or P. mirabilis: Cefazolin is susceptible if MIC <32 mcg/mL and predicts susceptible to the oral agents cefaclor, cefdinir, cefpodoxime, cefprozil, cefuroxime, cephalexin  and loracarbef. Legend: S = Susceptible  I = Intermediate R = Resistant  NS = Not susceptible SDD = Susceptible Dose Dependent * = Not Tested  NR = Not Reported **NN = See Therapy Comments   Urinalysis, Routine w reflex microscopic   Collection Time: 02/27/24  3:53 PM  Result Value Ref Range   Color, Urine YELLOW YELLOW   APPearance CLOUDY (A) CLEAR   Specific Gravity, Urine 1.021 1.001 - 1.035   pH 6.5 5.0 - 8.0   Glucose, UA NEGATIVE NEGATIVE   Bilirubin Urine NEGATIVE NEGATIVE   Ketones, ur NEGATIVE NEGATIVE   Hgb urine dipstick NEGATIVE NEGATIVE   Protein, ur NEGATIVE NEGATIVE   Nitrite NEGATIVE NEGATIVE   Leukocytes,Ua TRACE (A) NEGATIVE   WBC, UA 0-5 0 - 5 /HPF   RBC / HPF NONE SEEN 0 - 2 /HPF   Squamous Epithelial / HPF NONE SEEN < OR = 5 /HPF   Bacteria, UA NONE SEEN NONE SEEN /HPF   Hyaline Cast NONE SEEN NONE SEEN /LPF    Assessment and Plan  Multiple thyroid  nodules Assessment & Plan: Acute, incidental finding seen on MRI Will evaluate with thyroid  function tests and thyroid  ultrasound.  Orders: -     US  THYROID ; Future  Abnormal MRI, thoracic spine Assessment & Plan: Old compression fracture in the spine. Recommend weight bearing exercise, calcium  in diet and vit D supplement 400 IU 1-2 times daily. Will have to move forward  with bone density evaluation.  Additionally abnormal findings in the brain seen will evaluate more specifically with MRI brain.  Orders: -     MR BRAIN WO CONTRAST; Future  Acute pyelonephritis Assessment & Plan: Acute, concern for partially treated UTI with possible back pain associated with pyelonephritis given recurrent UTI despite 2 courses of antibiotics.  Symptoms  never resolved entirely.  Will have her repeat a course of Cipro  500 mg twice daily x 14 days.   Other orders -     Ciprofloxacin  HCl; Take 1 tablet (500 mg total) by mouth 2 (two) times daily for 14 days.  Dispense: 28 tablet; Refill: 0    No follow-ups on file.   Herby Lolling, MD

## 2024-03-06 ENCOUNTER — Ambulatory Visit
Admission: RE | Admit: 2024-03-06 | Discharge: 2024-03-06 | Disposition: A | Discharge status: Home / Self Care | Source: Ambulatory Visit | Attending: Family Medicine | Admitting: Family Medicine

## 2024-03-06 DIAGNOSIS — R221 Localized swelling, mass and lump, neck: Secondary | ICD-10-CM | POA: Diagnosis not present

## 2024-03-06 DIAGNOSIS — E042 Nontoxic multinodular goiter: Secondary | ICD-10-CM

## 2024-03-08 ENCOUNTER — Other Ambulatory Visit: Payer: Self-pay | Admitting: Family Medicine

## 2024-03-08 ENCOUNTER — Encounter: Payer: Self-pay | Admitting: Family Medicine

## 2024-03-08 DIAGNOSIS — E042 Nontoxic multinodular goiter: Secondary | ICD-10-CM

## 2024-03-09 DIAGNOSIS — E7800 Pure hypercholesterolemia, unspecified: Secondary | ICD-10-CM | POA: Diagnosis not present

## 2024-03-09 DIAGNOSIS — I2510 Atherosclerotic heart disease of native coronary artery without angina pectoris: Secondary | ICD-10-CM | POA: Diagnosis not present

## 2024-03-09 DIAGNOSIS — I480 Paroxysmal atrial fibrillation: Secondary | ICD-10-CM | POA: Diagnosis not present

## 2024-03-09 DIAGNOSIS — I10 Essential (primary) hypertension: Secondary | ICD-10-CM | POA: Diagnosis not present

## 2024-03-13 ENCOUNTER — Other Ambulatory Visit: Payer: Self-pay | Admitting: Family Medicine

## 2024-03-13 DIAGNOSIS — D649 Anemia, unspecified: Secondary | ICD-10-CM

## 2024-03-14 ENCOUNTER — Other Ambulatory Visit (HOSPITAL_COMMUNITY): Payer: Self-pay

## 2024-03-14 ENCOUNTER — Telehealth: Payer: Self-pay

## 2024-03-14 NOTE — Telephone Encounter (Signed)
 Pharmacy Patient Advocate Encounter   Received notification from Patient Pharmacy that prior authorization for Zepbound  is required/requested.   Insurance verification completed.   The patient is insured through [facility] .   Per test claim: PA required; PA submitted to above mentioned insurance via CoverMyMeds Key/confirmation #/EOC [id number] Status is pending

## 2024-03-14 NOTE — Telephone Encounter (Signed)
 Pharmacy Patient Advocate Encounter  Received notification from [facility] that Prior Authorization for Zepbound  12.5 has been APPROVED from 03/14/24 to 03/14/25. Ran test claim, Copay is $25.00. This test claim was processed through [facility]- copay amounts may vary at other pharmacies due to pharmacy/plan contracts, or as the patient moves through the different stages of their insurance plan.   PA #/Case ID/Reference #: [id number]

## 2024-03-15 ENCOUNTER — Ambulatory Visit
Admission: RE | Admit: 2024-03-15 | Discharge: 2024-03-15 | Disposition: A | Discharge status: Home / Self Care | Source: Ambulatory Visit | Attending: Family Medicine | Admitting: Family Medicine

## 2024-03-15 DIAGNOSIS — R937 Abnormal findings on diagnostic imaging of other parts of musculoskeletal system: Secondary | ICD-10-CM

## 2024-03-16 ENCOUNTER — Other Ambulatory Visit

## 2024-03-16 DIAGNOSIS — M546 Pain in thoracic spine: Secondary | ICD-10-CM | POA: Diagnosis not present

## 2024-03-16 DIAGNOSIS — E042 Nontoxic multinodular goiter: Secondary | ICD-10-CM

## 2024-03-16 DIAGNOSIS — G8929 Other chronic pain: Secondary | ICD-10-CM | POA: Diagnosis not present

## 2024-03-16 DIAGNOSIS — M4004 Postural kyphosis, thoracic region: Secondary | ICD-10-CM | POA: Diagnosis not present

## 2024-03-16 DIAGNOSIS — D649 Anemia, unspecified: Secondary | ICD-10-CM

## 2024-03-16 NOTE — Addendum Note (Signed)
 Addended by: Gerry Krone on: 03/16/2024 02:33 PM   Modules accepted: Orders

## 2024-03-16 NOTE — Addendum Note (Signed)
 Addended by: Gerry Krone on: 03/16/2024 02:27 PM   Modules accepted: Orders

## 2024-03-16 NOTE — Addendum Note (Signed)
 Addended by: Gerry Krone on: 03/16/2024 02:36 PM   Modules accepted: Orders

## 2024-03-16 NOTE — Progress Notes (Signed)
 Patient was checked in in error

## 2024-03-19 ENCOUNTER — Encounter: Payer: Self-pay | Admitting: Family Medicine

## 2024-03-20 ENCOUNTER — Other Ambulatory Visit (INDEPENDENT_AMBULATORY_CARE_PROVIDER_SITE_OTHER)

## 2024-03-20 DIAGNOSIS — D649 Anemia, unspecified: Secondary | ICD-10-CM

## 2024-03-20 DIAGNOSIS — E042 Nontoxic multinodular goiter: Secondary | ICD-10-CM | POA: Diagnosis not present

## 2024-03-21 ENCOUNTER — Ambulatory Visit: Payer: Self-pay | Admitting: Family Medicine

## 2024-03-21 DIAGNOSIS — R937 Abnormal findings on diagnostic imaging of other parts of musculoskeletal system: Secondary | ICD-10-CM | POA: Insufficient documentation

## 2024-03-21 DIAGNOSIS — E042 Nontoxic multinodular goiter: Secondary | ICD-10-CM | POA: Insufficient documentation

## 2024-03-21 LAB — CBC WITH DIFFERENTIAL/PLATELET
Basophils Absolute: 0.1 10*3/uL (ref 0.0–0.1)
Basophils Relative: 1.5 % (ref 0.0–3.0)
Eosinophils Absolute: 0.3 10*3/uL (ref 0.0–0.7)
Eosinophils Relative: 6 % — ABNORMAL HIGH (ref 0.0–5.0)
HCT: 30.4 % — ABNORMAL LOW (ref 36.0–46.0)
Hemoglobin: 9.9 g/dL — ABNORMAL LOW (ref 12.0–15.0)
Lymphocytes Relative: 23.8 % (ref 12.0–46.0)
Lymphs Abs: 1.3 10*3/uL (ref 0.7–4.0)
MCHC: 32.4 g/dL (ref 30.0–36.0)
MCV: 78.6 fl (ref 78.0–100.0)
Monocytes Absolute: 0.4 10*3/uL (ref 0.1–1.0)
Monocytes Relative: 7 % (ref 3.0–12.0)
Neutro Abs: 3.3 10*3/uL (ref 1.4–7.7)
Neutrophils Relative %: 61.7 % (ref 43.0–77.0)
Platelets: 323 10*3/uL (ref 150.0–400.0)
RBC: 3.87 Mil/uL (ref 3.87–5.11)
RDW: 16.9 % — ABNORMAL HIGH (ref 11.5–15.5)
WBC: 5.4 10*3/uL (ref 4.0–10.5)

## 2024-03-21 LAB — IBC PANEL
Iron: 44 ug/dL (ref 42–145)
Saturation Ratios: 7.7 % — ABNORMAL LOW (ref 20.0–50.0)
TIBC: 574 ug/dL — ABNORMAL HIGH (ref 250.0–450.0)
Transferrin: 410 mg/dL — ABNORMAL HIGH (ref 212.0–360.0)

## 2024-03-21 LAB — T3, FREE: T3, Free: 3.3 pg/mL (ref 2.3–4.2)

## 2024-03-21 LAB — TSH: TSH: 1.26 u[IU]/mL (ref 0.35–5.50)

## 2024-03-21 LAB — T4, FREE: Free T4: 1.21 ng/dL (ref 0.60–1.60)

## 2024-03-21 LAB — FERRITIN: Ferritin: 3.4 ng/mL — ABNORMAL LOW (ref 10.0–291.0)

## 2024-03-21 NOTE — Assessment & Plan Note (Addendum)
 Old compression fracture in the spine. Recommend weight bearing exercise, calcium  in diet and vit D supplement 400 IU 1-2 times daily. Will have to move forward with bone density evaluation.  Additionally abnormal findings in the brain seen will evaluate more specifically with MRI brain.

## 2024-03-21 NOTE — Assessment & Plan Note (Signed)
 Acute, incidental finding seen on MRI Will evaluate with thyroid  function tests and thyroid  ultrasound.

## 2024-03-21 NOTE — Assessment & Plan Note (Signed)
 Acute, concern for partially treated UTI with possible back pain associated with pyelonephritis given recurrent UTI despite 2 courses of antibiotics.  Symptoms never resolved entirely.  Will have her repeat a course of Cipro  500 mg twice daily x 14 days.

## 2024-04-09 ENCOUNTER — Other Ambulatory Visit (INDEPENDENT_AMBULATORY_CARE_PROVIDER_SITE_OTHER)

## 2024-04-09 ENCOUNTER — Encounter: Payer: Self-pay | Admitting: Physician Assistant

## 2024-04-09 ENCOUNTER — Ambulatory Visit: Admitting: Physician Assistant

## 2024-04-09 ENCOUNTER — Ambulatory Visit: Payer: Self-pay | Admitting: Physician Assistant

## 2024-04-09 VITALS — BP 110/60 | HR 71 | Ht 64.0 in | Wt 149.0 lb

## 2024-04-09 DIAGNOSIS — D509 Iron deficiency anemia, unspecified: Secondary | ICD-10-CM

## 2024-04-09 DIAGNOSIS — I480 Paroxysmal atrial fibrillation: Secondary | ICD-10-CM

## 2024-04-09 DIAGNOSIS — Z6825 Body mass index (BMI) 25.0-25.9, adult: Secondary | ICD-10-CM

## 2024-04-09 DIAGNOSIS — Z7901 Long term (current) use of anticoagulants: Secondary | ICD-10-CM

## 2024-04-09 DIAGNOSIS — Z800 Family history of malignant neoplasm of digestive organs: Secondary | ICD-10-CM

## 2024-04-09 DIAGNOSIS — E669 Obesity, unspecified: Secondary | ICD-10-CM

## 2024-04-09 LAB — CBC WITH DIFFERENTIAL/PLATELET
Basophils Absolute: 0.1 10*3/uL (ref 0.0–0.1)
Basophils Relative: 1.1 % (ref 0.0–3.0)
Eosinophils Absolute: 0.2 10*3/uL (ref 0.0–0.7)
Eosinophils Relative: 3.8 % (ref 0.0–5.0)
HCT: 33.4 % — ABNORMAL LOW (ref 36.0–46.0)
Hemoglobin: 11 g/dL — ABNORMAL LOW (ref 12.0–15.0)
Lymphocytes Relative: 22.8 % (ref 12.0–46.0)
Lymphs Abs: 1.2 10*3/uL (ref 0.7–4.0)
MCHC: 32.8 g/dL (ref 30.0–36.0)
MCV: 83.9 fl (ref 78.0–100.0)
Monocytes Absolute: 0.4 10*3/uL (ref 0.1–1.0)
Monocytes Relative: 7.6 % (ref 3.0–12.0)
Neutro Abs: 3.3 10*3/uL (ref 1.4–7.7)
Neutrophils Relative %: 64.7 % (ref 43.0–77.0)
Platelets: 270 10*3/uL (ref 150.0–400.0)
RBC: 3.98 Mil/uL (ref 3.87–5.11)
RDW: 25.3 % — ABNORMAL HIGH (ref 11.5–15.5)
WBC: 5.1 10*3/uL (ref 4.0–10.5)

## 2024-04-09 LAB — IBC + FERRITIN
Ferritin: 23.6 ng/mL (ref 10.0–291.0)
Iron: 249 ug/dL — ABNORMAL HIGH (ref 42–145)
Saturation Ratios: 60.1 % — ABNORMAL HIGH (ref 20.0–50.0)
TIBC: 414.4 ug/dL (ref 250.0–450.0)
Transferrin: 296 mg/dL (ref 212.0–360.0)

## 2024-04-09 LAB — B12 AND FOLATE PANEL
Folate: 5.4 ng/mL — ABNORMAL LOW (ref >5.9)
Vitamin B-12: 152 pg/mL — ABNORMAL LOW (ref 211–911)

## 2024-04-09 MED ORDER — NA SULFATE-K SULFATE-MG SULF 17.5-3.13-1.6 GM/177ML PO SOLN: 1.0000 | Freq: Once | ORAL | 0 refills | Status: AC

## 2024-04-09 NOTE — Progress Notes (Signed)
 Chief Complaint: Discuss colonoscopy in a patient on chronic anticoagulation  HPI:    Amanda Estrada is a 63 year old female with a past medical history as listed below including reflux and CAD, A-fib on Eliquis  (02/22/22 echo with LVEF 60-65% mild LVH), known to Dr. Dominic Friendly, who presents to clinic today to discuss a colonoscopy.    12/04/2018 colonoscopy with entire colon normal.  Repeat recommended in 5 years due to family history of colon cancer in a first-degree relative.    01/05/2021 EGD with a 6 to 7 cm hiatal hernia with mild distal esophageal tortuosity, also grade C reflux esophagitis and EG junction stricture about 12 mm in initial diameter, balloon dilation of 17 mm.  Placed on Meprazole 40 twice daily.    02/17/21 patient seen in clinic by Dr. Dominic Friendly and at that time following up for dysphagia.  At that time discussed consideration for surgical consultation.    03/09/2024 patient followed with cardiology at [facility].  At that time experiencing some dizziness particularly when rolling over in bed.  She had a brain MRI scheduled to investigate further.  Discussed recurrent UTIs.  Currently on Tirzepatide .  Had lost about 45 pounds.    03/20/2024 CBC with a hemoglobin of 9.9 (13.2 on/5/23), normal MCV (though decreased 94--> 78.6 over the past 2 years).  Iron studies with a ferritin of 3.4.  Iron panel with iron low at 44, percent saturation 7.7, TIBC elevated 524.0.    Today, patient presents to clinic and tells me that she was recently told that she is iron deficient and her PCP thought maybe she needed an endoscopy as well as her surveillance colonoscopy.  She tells me she was getting dizzy and somewhat short of breath and fatigued when she went to see her cardiologist and they told her to have iron studies checked.  She has now been on ferrous sulfate 325 mg daily for the past 2 weeks and is actually feeling some better.  Denies any other acute changes in GI symptoms.  She is on Zepbound  and has lost  over 45 pounds.    Just came back from her condo at [facility].  She was there for the past 2 weeks.    Denies fever, chills, weight loss or blood in her stool.  Past Medical History:  Diagnosis Date   Allergy    Anxiety    patient denies   Asthma    Cataract    Dysplastic nevus 08/28/2018   R paraspinal mid to upper back - severe, excision 11/21/2018   Dysplastic nevus 08/28/2018   L lat breast sup - moderate   Dysplastic nevus 07/02/2019   R mid to low back 4.0 cm lat to spine - moderate   Dysplastic nevus 07/02/2019   R inframammary - moderate   GERD (gastroesophageal reflux disease)    Glaucoma    History of echocardiogram    a. 11/2015: EF 55-60%, no RWMA, nl LV diastolic fxn, PASP nl   Hypertension    Obesity    PAF (paroxysmal atrial fibrillation) (HCC)    a. CHADS2VASc = 2 (HTN, sex category)   Sinusitis 2011   Recurrent per Dr. Silvestre Drum with  ENT    Past Surgical History:  Procedure Laterality Date   BUNIONECTOMY  11/2007   Right foot, 2 hammer-toes and tendon replacement (Dr. Celia Coles)   cataract Bilateral 06/2018   narrow angle glaucoma bilateral   CERVICAL DISCECTOMY  2001   C5/6   CESAREAN SECTION  1985  Due to incr BP   COLONOSCOPY     ENDOMETRIAL BIOPSY  03/27/2008   B9 endometrium w/breakdown changes (Dr, Kinscius)   LASER BRONCHOSCOPY N/A 09/19/2017   Procedure: LASER BRONCHOSCOPY;  Surgeon: Zelphia Higashi, MD;  Location: [facility] OR;  Service: Thoracic;  Laterality: N/A;   Lasix  eye surgery     LUNG SURGERY Left 09/19/2017   benign tumor removed   NSVD  1989   VBAC   Renal artery US   02/2007   Serpentine arteries but no stenosis   TIBIA FRACTURE SURGERY Left 12/2017   US  TRANSVAGINAL PELVIC MODIFIED  04/09/2008   Normal   VIDEO BRONCHOSCOPY Bilateral 08/10/2017   Procedure: VIDEO BRONCHOSCOPY WITHOUT FLUORO;  Surgeon: Diamond Formica, MD;  Location: [facility];  Service: Cardiopulmonary;  Laterality: Bilateral;   VIDEO BRONCHOSCOPY  N/A 09/19/2017   Procedure: VIDEO BRONCHOSCOPY, with endobronchial tumor resection;  Surgeon: Zelphia Higashi, MD;  Location: [facility] OR;  Service: Thoracic;  Laterality: N/A;   VIDEO BRONCHOSCOPY Bilateral 08/16/2018   Procedure: VIDEO BRONCHOSCOPY WITHOUT FLUORO;  Surgeon: Diamond Formica, MD;  Location: [facility];  Service: Cardiopulmonary;  Laterality: Bilateral;    Current Outpatient Medications  Medication Sig Dispense Refill   albuterol  (VENTOLIN  HFA) 108 (90 Base) MCG/ACT inhaler Inhale 2 puffs into the lungs every 4 (four) hours as needed for wheezing or shortness of breath. 8 g 2   apixaban  (ELIQUIS ) 5 MG TABS tablet Take 1 tablet (5 mg total) by mouth 2 (two) times daily. 60 tablet 11   bisoprolol  (ZEBETA ) 10 MG tablet Take 1 tablet (10 mg total) by mouth daily. 90 tablet 3   cloNIDine  (CATAPRES ) 0.1 MG tablet TAKE 1 TABLET BY MOUTH EVERYDAY AT BEDTIME 90 tablet 1   diltiazem  (CARDIZEM  CD) 240 MG 24 hr capsule TAKE 1 CAPSULE BY MOUTH EVERY DAY 90 capsule 3   fluticasone  (FLONASE ) 50 MCG/ACT nasal spray PLACE 1 SPRAY INTO BOTH NOSTRILS DAILY AS NEEDED FOR ALLERGIES. 48 mL 1   levocetirizine (XYZAL) 5 MG tablet Take 5 mg by mouth at bedtime.      losartan  (COZAAR ) 100 MG tablet Take 1 tablet (100 mg total) by mouth daily. 90 tablet 1   montelukast  (SINGULAIR ) 10 MG tablet TAKE 1 TABLET BY MOUTH EVERYDAY AT BEDTIME 90 tablet 3   omeprazole  (PRILOSEC) 40 MG capsule TAKE 1 CAPSULE BY MOUTH TWICE A DAY 180 capsule 3   rosuvastatin  (CRESTOR ) 40 MG tablet Take 40 mg by mouth daily.     ZEPBOUND  12.5 MG/0.5ML Pen Inject 12.5 mg into the skin once a week. 6 mL 3   No current facility-administered medications for this visit.    Allergies as of 04/09/2024 - Review Complete 03/01/2024  Allergen Reaction Noted   Amoxicillin -pot clavulanate Other (See Comments) 08/06/2016    Family History  Problem Relation Age of Onset   Cancer - Colon Mother 11       partial colectomy   Melanoma  Mother        x2   Colon cancer Mother    Heart disease Father 50       CHF   Hypertension Father    Pneumonia Father    Colon cancer Father 87   Colon polyps Sister    Hypertension Brother    Colon polyps Brother    Hypertension Sister    Colon polyps Sister    Colon polyps Sister    Esophageal cancer Neg Hx    Rectal cancer Neg Hx  Stomach cancer Neg Hx     Social History   Socioeconomic History   Marital status: Married    Spouse name: Not on file   Number of children: 2   Years of education: Not on file   Highest education level: Associate degree: occupational, Scientist, product/process development, or vocational program  Occupational History   Occupation: Data processing manager, Scientist, research (life sciences): [employer]  Tobacco Use   Smoking status: Never   Smokeless tobacco: Never  Vaping Use   Vaping status: Never Used  Substance and Sexual Activity   Alcohol use: Yes    Alcohol/week: 14.0 standard drinks of alcohol    Types: 14 Glasses of wine per week    Comment: 2 glasses of wine every day   Drug use: No   Sexual activity: Not on file  Other Topics Concern   Not on file  Social History Narrative   No regular exercise, used to but unable to do given chronic cough.   Social Drivers of Corporate investment banker Strain: Low Risk  (10/19/2023)   Overall Financial Resource Strain (CARDIA)    Difficulty of Paying Living Expenses: Not hard at all  Food Insecurity: No Food Insecurity (10/19/2023)   Hunger Vital Sign    Worried About Running Out of Food in the Last Year: Never true    Ran Out of Food in the Last Year: Never true  Transportation Needs: No Transportation Needs (10/19/2023)   PRAPARE - Administrator, Civil Service (Medical): No    Lack of Transportation (Non-Medical): No  Physical Activity: Insufficiently Active (10/19/2023)   Exercise Vital Sign    Days of Exercise per Week: 2 days    Minutes of Exercise per Session: 30 min  Stress: No Stress  Concern Present (10/19/2023)   [employer] of Occupational Health - Occupational Stress Questionnaire    Feeling of Stress : Not at all  Social Connections: Moderately Integrated (10/19/2023)   Social Connection and Isolation Panel [NHANES]    Frequency of Communication with Friends and Family: More than three times a week    Frequency of Social Gatherings with Friends and Family: Twice a week    Attends Religious Services: Never    Database administrator or Organizations: Yes    Attends Engineer, structural: More than 4 times per year    Marital Status: Married  Catering manager Violence: Not on file    Review of Systems:    Constitutional: No weight loss, fever or chills Skin: No rash  Cardiovascular: No chest pain  Respiratory: +DOE Gastrointestinal: See HPI and otherwise negative Genitourinary: No dysuria  Neurological: No headache, dizziness or syncope Musculoskeletal: No new muscle or joint pain Hematologic: No bleeding  Psychiatric: No history of depression or anxiety   Physical Exam:  Vital signs: BP 110/60   Pulse 71   Ht 5\' 4"  (1.626 m)   Wt 149 lb (67.6 kg)   BMI 25.58 kg/m    Constitutional:   Pleasant Caucasian female appears to be in NAD, Well developed, Well nourished, alert and cooperative Head:  Normocephalic and atraumatic. Eyes:   PEERL, EOMI. No icterus. Conjunctiva pink. Ears:  Normal auditory acuity. Neck:  Supple Throat: Oral cavity and pharynx without inflammation, swelling or lesion.  Respiratory: Respirations even and unlabored. Lungs clear to auscultation bilaterally.   No wheezes, crackles, or rhonchi.  Cardiovascular: Normal S1, S2. No MRG. Regular rate and rhythm. No peripheral edema, cyanosis  or pallor.  Gastrointestinal:  Soft, nondistended, nontender. No rebound or guarding. Normal bowel sounds. No appreciable masses or hepatomegaly. Rectal:  Not performed.  Msk:  Symmetrical without gross deformities. Without edema, no  deformity or joint abnormality.  Neurologic:  Alert and  oriented x4;  grossly normal neurologically.  Skin:   Dry and intact without significant lesions or rashes. Psychiatric: Demonstrates good judgement and reason without abnormal affect or behaviors.  RELEVANT LABS AND IMAGING: CBC    Component Value Date/Time   WBC 5.4 03/20/2024 1348   RBC 3.87 03/20/2024 1348   HGB 9.9 (L) 03/20/2024 1348   HGB 13.2 02/10/2022 1232   HCT 30.4 (L) 03/20/2024 1348   HCT 39.0 02/10/2022 1232   PLT 323.0 03/20/2024 1348   PLT 183 02/10/2022 1232   MCV 78.6 03/20/2024 1348   MCV 94 02/10/2022 1232   MCH 31.8 02/10/2022 1232   MCH 31.9 11/19/2020 2152   MCHC 32.4 03/20/2024 1348   RDW 16.9 (H) 03/20/2024 1348   RDW 12.8 02/10/2022 1232   LYMPHSABS 1.3 03/20/2024 1348   MONOABS 0.4 03/20/2024 1348   EOSABS 0.3 03/20/2024 1348   BASOSABS 0.1 03/20/2024 1348    CMP     Component Value Date/Time   NA 135 10/12/2023 0921   NA 132 (L) 02/10/2022 1232   K 4.3 10/12/2023 0921   CL 101 10/12/2023 0921   CO2 26 10/12/2023 0921   GLUCOSE 87 10/12/2023 0921   BUN 13 10/12/2023 0921   BUN 10 02/10/2022 1232   CREATININE 0.84 10/12/2023 0921   CALCIUM  9.9 10/12/2023 0921   PROT 6.7 10/12/2023 0921   ALBUMIN 4.3 10/12/2023 0921   AST 25 10/12/2023 0921   ALT 22 10/12/2023 0921   ALKPHOS 62 10/12/2023 0921   BILITOT 0.5 10/12/2023 0921   GFRNONAA 95 12/24/2020 1031   GFRNONAA >60 11/19/2020 2152   GFRAA 110 12/24/2020 1031    Assessment: 1.  Iron deficiency anemia: Recently told by her PCP, has been on oral iron for 2 weeks and does feel some better as far as DOE and dizziness; consider GI etiology 2.  A-fib and CAD: On Eliquis  3.  Obesity: On Zepbound  is lost over 45 pounds 4.  Family history of colon cancer: Patient is overdue for a surveillance colonoscopy  Plan: 1.  Scheduled patient for diagnostic EGD and colonoscopy given iron deficiency anemia and family history of colon cancer.   These procedures are scheduled with Dr. Dominic Friendly in [facility].  Did provide the patient a detailed list of risks for procedures and she agrees to proceed. 2.  Will recheck iron studies with ferritin and B12/folate as well as CBC today.  Pending how she is doing with oral iron could consider referral to hematology for iron infusion.  We discussed this today. 3.  Patient advised to hold her Eliquis  for 2 days prior to time of procedure.  Also advised her to hold Zepbound  for 7 days prior to dental procedure. 4.  Will communicate with patient's cardiologist to ensure that holding her Eliquis  is acceptable for her. 5.  Patient to follow in clinic per recommendations after time of procedures.  Reginal Capra, PA-C [facility] 04/09/2024, 11:04 AM  Cc: Judithann Novas, MD

## 2024-04-09 NOTE — Progress Notes (Signed)
 Please let patient know that her labs actually look dramatically better compared to 2 weeks ago.  I do not think she needs an iron infusion.  She should continue ferrous sulfate 325 mg daily, she also has a B12 deficiency this would like her to start an over-the-counter B12 supplement every day.  We will continue with EGD and colonoscopy as scheduled for further evaluation, but things are actually looking good.  Thanks, JL L

## 2024-04-09 NOTE — Patient Instructions (Signed)
 Continue oral iron supplement daily.   Your provider has requested that you go to the basement level for lab work before leaving today. Press "B" on the elevator. The lab is located at the first door on the left as you exit the elevator.  You have been scheduled for an endoscopy and colonoscopy. Please follow the written instructions given to you at your visit today.  If you use inhalers (even only as needed), please bring them with you on the day of your procedure.  DO NOT TAKE 7 DAYS PRIOR TO TEST- Trulicity (dulaglutide) Ozempic , Wegovy  (semaglutide ) Mounjaro  (tirzepatide ) Bydureon Bcise (exanatide extended release)  DO NOT TAKE 1 DAY PRIOR TO YOUR TEST Rybelsus  (semaglutide ) Adlyxin (lixisenatide) Victoza (liraglutide) Byetta (exanatide) ____________________________________________________________________

## 2024-04-11 NOTE — Progress Notes (Signed)
 ____________________________________________________________  Attending physician addendum:  Thank you for sending this case to me. I have reviewed the entire note and agree with the plan.  EGD and colonoscopy warranted for workup of IDA. Chronic use of PPI may be contributing to some iron malabsorption as well.  Lorella Roles, MD  ____________________________________________________________

## 2024-04-19 DIAGNOSIS — M5124 Other intervertebral disc displacement, thoracic region: Secondary | ICD-10-CM | POA: Diagnosis not present

## 2024-04-19 DIAGNOSIS — M4804 Spinal stenosis, thoracic region: Secondary | ICD-10-CM | POA: Diagnosis not present

## 2024-05-02 DIAGNOSIS — M5414 Radiculopathy, thoracic region: Secondary | ICD-10-CM | POA: Diagnosis not present

## 2024-05-14 ENCOUNTER — Encounter: Payer: Self-pay | Admitting: Family Medicine

## 2024-05-17 ENCOUNTER — Encounter: Payer: Self-pay | Admitting: Gastroenterology

## 2024-05-17 ENCOUNTER — Other Ambulatory Visit: Payer: Self-pay | Admitting: Family Medicine

## 2024-05-17 MED ORDER — TIRZEPATIDE-WEIGHT MANAGEMENT 15 MG/0.5ML ~~LOC~~ SOAJ: 15.0000 mg | SUBCUTANEOUS | 1 refills | Status: DC

## 2024-05-20 ENCOUNTER — Encounter: Payer: Self-pay | Admitting: Gastroenterology

## 2024-05-24 ENCOUNTER — Ambulatory Visit: Admitting: Gastroenterology

## 2024-05-24 ENCOUNTER — Other Ambulatory Visit (INDEPENDENT_AMBULATORY_CARE_PROVIDER_SITE_OTHER)

## 2024-05-24 ENCOUNTER — Encounter: Payer: Self-pay | Admitting: Gastroenterology

## 2024-05-24 ENCOUNTER — Other Ambulatory Visit: Payer: Self-pay | Admitting: *Deleted

## 2024-05-24 VITALS — BP 176/95 | HR 86 | Temp 97.0°F | Resp 15 | Ht 64.0 in | Wt 149.0 lb

## 2024-05-24 VITALS — BP 155/75 | HR 69 | Resp 14

## 2024-05-24 DIAGNOSIS — D509 Iron deficiency anemia, unspecified: Secondary | ICD-10-CM

## 2024-05-24 DIAGNOSIS — K31A11 Gastric intestinal metaplasia without dysplasia, involving the antrum: Secondary | ICD-10-CM

## 2024-05-24 DIAGNOSIS — Z800 Family history of malignant neoplasm of digestive organs: Secondary | ICD-10-CM

## 2024-05-24 DIAGNOSIS — K449 Diaphragmatic hernia without obstruction or gangrene: Secondary | ICD-10-CM

## 2024-05-24 DIAGNOSIS — K2940 Chronic atrophic gastritis without bleeding: Secondary | ICD-10-CM | POA: Diagnosis not present

## 2024-05-24 DIAGNOSIS — K317 Polyp of stomach and duodenum: Secondary | ICD-10-CM

## 2024-05-24 DIAGNOSIS — K3189 Other diseases of stomach and duodenum: Secondary | ICD-10-CM | POA: Diagnosis not present

## 2024-05-24 DIAGNOSIS — Z1211 Encounter for screening for malignant neoplasm of colon: Secondary | ICD-10-CM | POA: Diagnosis not present

## 2024-05-24 DIAGNOSIS — K2950 Unspecified chronic gastritis without bleeding: Secondary | ICD-10-CM | POA: Diagnosis not present

## 2024-05-24 LAB — CBC WITH DIFFERENTIAL/PLATELET
Basophils Absolute: 0 K/uL (ref 0.0–0.1)
Basophils Relative: 0.8 % (ref 0.0–3.0)
Eosinophils Absolute: 0 K/uL (ref 0.0–0.7)
Eosinophils Relative: 0.6 % (ref 0.0–5.0)
HCT: 35.5 % — ABNORMAL LOW (ref 36.0–46.0)
Hemoglobin: 11.8 g/dL — ABNORMAL LOW (ref 12.0–15.0)
Lymphocytes Relative: 16.3 % (ref 12.0–46.0)
Lymphs Abs: 1 K/uL (ref 0.7–4.0)
MCHC: 33.2 g/dL (ref 30.0–36.0)
MCV: 92.4 fl (ref 78.0–100.0)
Monocytes Absolute: 0.3 K/uL (ref 0.1–1.0)
Monocytes Relative: 5.3 % (ref 3.0–12.0)
Neutro Abs: 4.8 K/uL (ref 1.4–7.7)
Neutrophils Relative %: 77 % (ref 43.0–77.0)
Platelets: 248 K/uL (ref 150.0–400.0)
RBC: 3.84 Mil/uL — ABNORMAL LOW (ref 3.87–5.11)
RDW: 23.5 % — ABNORMAL HIGH (ref 11.5–15.5)
WBC: 6.2 K/uL (ref 4.0–10.5)

## 2024-05-24 LAB — IBC + FERRITIN
Ferritin: 19.7 ng/mL (ref 10.0–291.0)
Iron: 64 ug/dL (ref 42–145)
Saturation Ratios: 16.6 % — ABNORMAL LOW (ref 20.0–50.0)
TIBC: 386.4 ug/dL (ref 250.0–450.0)
Transferrin: 276 mg/dL (ref 212.0–360.0)

## 2024-05-24 MED ORDER — SODIUM CHLORIDE 0.9 % IV SOLN: 500.0000 mL | Freq: Once | INTRAVENOUS | Status: DC

## 2024-05-24 NOTE — Patient Instructions (Signed)
 Please read handouts provided. Continue present medications. Resume previous diet. Await pathology results. Resume Eliquis  ( apixaban  ) at prior dose tomorrow. Check CBC and Iron studies within two weeks. Repeat colonoscopy in 5 years for screening.   YOU HAD AN ENDOSCOPIC PROCEDURE TODAY AT THE [facility]:   Refer to the procedure report that was given to you for any specific questions about what was found during the examination.  If the procedure report does not answer your questions, please call your gastroenterologist to clarify.  If you requested that your care partner not be given the details of your procedure findings, then the procedure report has been included in a sealed envelope for you to review at your convenience later.  YOU SHOULD EXPECT: Some feelings of bloating in the abdomen. Passage of more gas than usual.  Walking can help get rid of the air that was put into your GI tract during the procedure and reduce the bloating. If you had a lower endoscopy (such as a colonoscopy or flexible sigmoidoscopy) you may notice spotting of blood in your stool or on the toilet paper. If you underwent a bowel prep for your procedure, you may not have a normal bowel movement for a few days.  Please Note:  You might notice some irritation and congestion in your nose or some drainage.  This is from the oxygen used during your procedure.  There is no need for concern and it should clear up in a day or so.  SYMPTOMS TO REPORT IMMEDIATELY:  Following lower endoscopy (colonoscopy or flexible sigmoidoscopy):  Excessive amounts of blood in the stool  Significant tenderness or worsening of abdominal pains  Swelling of the abdomen that is new, acute  Fever of 100F or higher  Following upper endoscopy (EGD)  Vomiting of blood or coffee ground material  New chest pain or pain under the shoulder blades  Painful or persistently difficult swallowing  New shortness of breath  Fever of  100F or higher  Black, tarry-looking stools  For urgent or emergent issues, a gastroenterologist can be reached at any hour by calling [phone]. Do not use MyChart messaging for urgent concerns.    DIET:  We do recommend a small meal at first, but then you may proceed to your regular diet.  Drink plenty of fluids but you should avoid alcoholic beverages for 24 hours.  ACTIVITY:  You should plan to take it easy for the rest of today and you should NOT DRIVE or use heavy machinery until tomorrow (because of the sedation medicines used during the test).    FOLLOW UP: Our staff will call the number listed on your records the next business day following your procedure.  We will call around 7:15- 8:00 am to check on you and address any questions or concerns that you may have regarding the information given to you following your procedure. If we do not reach you, we will leave a message.     If any biopsies were taken you will be contacted by phone or by letter within the next 1-3 weeks.  Please call us  at [phone] if you have not heard about the biopsies in 3 weeks.    SIGNATURES/CONFIDENTIALITY: You and/or your care partner have signed paperwork which will be entered into your electronic medical record.  These signatures attest to the fact that that the information above on your After Visit Summary has been reviewed and is understood.  Full responsibility of the confidentiality of this  discharge information lies with you and/or your care-partner.

## 2024-05-24 NOTE — Progress Notes (Unsigned)
 History and Physical:  This patient presents for endoscopic testing for: Encounter Diagnoses  Name Primary?   Family history of colon cancer Yes   Iron deficiency anemia, unspecified iron deficiency anemia type     See clinical details in 04/09/24 office consult note for IDA and fam Hx crc Patient prepped for colonoscopy, but schedule miscommunication on clinic end had her only on schedule for Colonoscopy rather than EGD/colon as planned and no insurance authorization for EGD obtained. Endoscopy staff reached [employer] today and learned that prior authorization not needed for the EGD.  So all discussed with patient and we will proceed with both.   Past Medical History: Past Medical History:  Diagnosis Date   Allergy    Anxiety    patient denies   Asthma    CAD (coronary artery disease) of bypass graft    Cataract    Dysplastic nevus 08/28/2018   R paraspinal mid to upper back - severe, excision 11/21/2018   Dysplastic nevus 08/28/2018   L lat breast sup - moderate   Dysplastic nevus 07/02/2019   R mid to low back 4.0 cm lat to spine - moderate   Dysplastic nevus 07/02/2019   R inframammary - moderate   GERD (gastroesophageal reflux disease)    Glaucoma    History of echocardiogram    a. 11/2015: EF 55-60%, no RWMA, nl LV diastolic fxn, PASP nl   Hypertension    Obesity    PAF (paroxysmal atrial fibrillation) (HCC)    a. CHADS2VASc = 2 (HTN, sex category)   Sinusitis 2011   Recurrent per Dr. Herminio with [facility]     Past Surgical History: Past Surgical History:  Procedure Laterality Date   BUNIONECTOMY  11/2007   Right foot, 2 hammer-toes and tendon replacement (Dr. Magdalen)   cataract Bilateral 06/2018   narrow angle glaucoma bilateral   CERVICAL DISCECTOMY  2001   C5/6   CESAREAN SECTION  1985   Due to incr BP   COLONOSCOPY     ENDOMETRIAL BIOPSY  03/27/2008   B9 endometrium w/breakdown changes (Dr, Kinscius)   LASER BRONCHOSCOPY N/A 09/19/2017   Procedure: LASER  BRONCHOSCOPY;  Surgeon: Kerrin Elspeth BROCKS, MD;  Location: [facility] OR;  Service: Thoracic;  Laterality: N/A;   Lasix  eye surgery     LUNG SURGERY Left 09/19/2017   benign tumor removed   NSVD  1989   VBAC   Renal artery US   02/2007   Serpentine arteries but no stenosis   TIBIA FRACTURE SURGERY Left 12/2017   US  TRANSVAGINAL PELVIC MODIFIED  04/09/2008   Normal   VIDEO BRONCHOSCOPY Bilateral 08/10/2017   Procedure: VIDEO BRONCHOSCOPY WITHOUT FLUORO;  Surgeon: Darlean Ozell NOVAK, MD;  Location: [facility];  Service: Cardiopulmonary;  Laterality: Bilateral;   VIDEO BRONCHOSCOPY N/A 09/19/2017   Procedure: VIDEO BRONCHOSCOPY, with endobronchial tumor resection;  Surgeon: Kerrin Elspeth BROCKS, MD;  Location: [facility] OR;  Service: Thoracic;  Laterality: N/A;   VIDEO BRONCHOSCOPY Bilateral 08/16/2018   Procedure: VIDEO BRONCHOSCOPY WITHOUT FLUORO;  Surgeon: Darlean Ozell NOVAK, MD;  Location: [facility];  Service: Cardiopulmonary;  Laterality: Bilateral;    Allergies: Allergies  Allergen Reactions   Amoxicillin -Pot Clavulanate Other (See Comments)    Diarrhea Has patient had a PCN reaction causing immediate rash, facial/tongue/throat swelling, SOB or lightheadedness with hypotension: No Has patient had a PCN reaction causing severe rash involving mucus membranes or skin necrosis: No Has patient had a PCN reaction that required hospitalization: No Has patient had a  PCN reaction occurring within the last 10 years: Yes--diarrhea ONLY If all of the above answers are NO, then may proceed with Cephalosporin use.     Outpatient Meds: Current Outpatient Medications  Medication Sig Dispense Refill   bisoprolol  (ZEBETA ) 10 MG tablet Take 1 tablet (10 mg total) by mouth daily. 90 tablet 3   cloNIDine  (CATAPRES ) 0.1 MG tablet TAKE 1 TABLET BY MOUTH EVERYDAY AT BEDTIME 90 tablet 1   diltiazem  (CARDIZEM  CD) 240 MG 24 hr capsule TAKE 1 CAPSULE BY MOUTH EVERY DAY 90 capsule 3   fluticasone  (FLONASE ) 50 MCG/ACT  nasal spray PLACE 1 SPRAY INTO BOTH NOSTRILS DAILY AS NEEDED FOR ALLERGIES. 48 mL 1   levocetirizine (XYZAL) 5 MG tablet Take 5 mg by mouth at bedtime.      losartan  (COZAAR ) 100 MG tablet Take 1 tablet (100 mg total) by mouth daily. 90 tablet 1   montelukast  (SINGULAIR ) 10 MG tablet TAKE 1 TABLET BY MOUTH EVERYDAY AT BEDTIME 90 tablet 3   omeprazole  (PRILOSEC) 40 MG capsule TAKE 1 CAPSULE BY MOUTH TWICE A DAY 180 capsule 3   rosuvastatin  (CRESTOR ) 40 MG tablet Take 40 mg by mouth daily.     albuterol  (VENTOLIN  HFA) 108 (90 Base) MCG/ACT inhaler Inhale 2 puffs into the lungs every 4 (four) hours as needed for wheezing or shortness of breath. 8 g 2   apixaban  (ELIQUIS ) 5 MG TABS tablet Take 1 tablet (5 mg total) by mouth 2 (two) times daily. 60 tablet 11   tirzepatide  (ZEPBOUND ) 15 MG/0.5ML Pen Inject 15 mg into the skin once a week. 6 mL 1   Current Facility-Administered Medications  Medication Dose Route Frequency Provider Last Rate Last Admin   0.9 %  sodium chloride  infusion  500 mL Intravenous Once Danis, Victory CROME III, MD          ___________________________________________________________________ Objective   Exam:  BP 134/61   Pulse 72   Temp (!) 97 F (36.1 C) (Temporal)   Ht 5' 4 (1.626 m)   Wt 149 lb (67.6 kg)   SpO2 100%   BMI 25.58 kg/m   CV: regular , S1/S2 Resp: clear to auscultation bilaterally, normal RR and effort noted GI: soft, no tenderness, with active bowel sounds.   Assessment: Encounter Diagnoses  Name Primary?   Family history of colon cancer Yes   Iron deficiency anemia, unspecified iron deficiency anemia type      Plan: Colonoscopy EGD  The benefits and risks of the planned procedure(s) were described in detail with the patient or (when appropriate) their health care proxy.  Risks were outlined as including, but not limited to, bleeding, infection, perforation, adverse medication reaction leading to cardiac or pulmonary decompensation,  pancreatitis (if ERCP).  The limitation of incomplete mucosal visualization was also discussed.  No guarantees or warranties were given.  The patient is appropriate for an endoscopic procedure in the ambulatory setting.   - Victory Brand, MD

## 2024-05-24 NOTE — Progress Notes (Unsigned)
 Pt's states no medical or surgical changes since previsit or office visit.

## 2024-05-24 NOTE — Progress Notes (Unsigned)
 Report given to PACU, vss

## 2024-05-24 NOTE — Progress Notes (Signed)
 Called to room to assist during endoscopic procedure.  Patient ID and intended procedure confirmed with present staff. Received instructions for my participation in the procedure from the performing physician.

## 2024-05-24 NOTE — Progress Notes (Unsigned)
1353 Robinul 0.1 mg IV given due large amount of secretions upon assessment.  MD made aware, vss 

## 2024-05-24 NOTE — Op Note (Signed)
 [facility] Patient Name: Amanda Estrada Procedure Date: 05/24/2024 2:06 PM MRN: [id number] Endoscopist: Victory L. Legrand , MD, [id number] Age: 63 Referring MD:  Date of Birth: April 18, 1961 Gender: Female Account #: [id number] Procedure:                Upper GI endoscopy Indications:              Unexplained iron deficiency anemia Medicines:                Monitored Anesthesia Care Procedure:                Pre-Anesthesia Assessment:                           - Prior to the procedure, a History and Physical                            was performed, and patient medications and                            allergies were reviewed. The patient's tolerance of                            previous anesthesia was also reviewed. The risks                            and benefits of the procedure and the sedation                            options and risks were discussed with the patient.                            All questions were answered, and informed consent                            was obtained. Prior Anticoagulants: The patient has                            taken Eliquis  (apixaban ), last dose was 2 days                            prior to procedure. ASA Grade Assessment: III - A                            patient with severe systemic disease. After                            reviewing the risks and benefits, the patient was                            deemed in satisfactory condition to undergo the                            procedure.  After obtaining informed consent, the endoscope was                            passed under direct vision. Throughout the                            procedure, the patient's blood pressure, pulse, and                            oxygen saturations were monitored continuously. The                            GIF [id number] #[id number] was introduced through the                            mouth, and advanced to the second part of  duodenum.                            The upper GI endoscopy was accomplished without                            difficulty. The patient tolerated the procedure                            well. Scope In: Scope Out: Findings:                 A 3-4 cm hiatal hernia was present. (Less prominent                            than when last seen on EGD) No Cameron erosions or                            other abnormalities were seen within the hernia sac.                           There is no endoscopic evidence of esophagitis in                            the entire esophagus.                           Multiple small sessile fundic gland polyps were                            found in the gastric body. (examined under WL and                            NBI)                           Diffuse atrophic mucosa was found in the gastric                            antrum. Biopsies were  taken with a cold forceps for                            histology. (antrum and body in one jar to r/o H                            pylori)                           The exam of the stomach was otherwise normal.                           Normal mucosa was found in the entire duodenum.                            Biopsies for histology were taken with a cold                            forceps for evaluation of celiac disease. Complications:            No immediate complications. Estimated Blood Loss:     Estimated blood loss was minimal. Impression:               - 3-4 cm hiatal hernia.                           - Multiple fundic gland polyps.                           - Gastric mucosal atrophy. Biopsied.                           - Normal mucosa was found in the entire examined                            duodenum. Biopsied. Recommendation:           - Patient has a contact number available for                            emergencies. The signs and symptoms of potential                            delayed complications were  discussed with the                            patient. Return to normal activities tomorrow.                            Written discharge instructions were provided to the                            patient.                           - Resume previous diet.                           -  Resume Eliquis  (apixaban ) at prior dose tomorrow.                           - Await pathology results.                           - See the other procedure note for documentation of                            additional recommendations.                           - Check CBC and Iron studies within two weeks                           Depending on those levels and how they responded to                            iron, will then make a decision regarding any                            necessary stool testing for occult blood and/or                            small bowel video capsule study. Mattie Novosel L. Legrand, MD 05/24/2024 2:51:38 PM This report has been signed electronically.

## 2024-05-24 NOTE — Op Note (Signed)
 [facility] Patient Name: Amanda Estrada Procedure Date: 05/24/2024 2:07 PM MRN: [id number] Endoscopist: Victory L. Legrand , MD, [id number] Age: 63 Referring MD:  Date of Birth: 1960/12/10 Gender: Female Account #: [id number] Procedure:                Colonoscopy Indications:              Screening in patient at increased risk: Family                            history of 1st-degree relative with colorectal                            cancer Medicines:                Monitored Anesthesia Care Procedure:                Pre-Anesthesia Assessment:                           - Prior to the procedure, a History and Physical                            was performed, and patient medications and                            allergies were reviewed. The patient's tolerance of                            previous anesthesia was also reviewed. The risks                            and benefits of the procedure and the sedation                            options and risks were discussed with the patient.                            All questions were answered, and informed consent                            was obtained. Prior Anticoagulants: The patient has                            taken Eliquis  (apixaban ), last dose was 2 days                            prior to procedure. ASA Grade Assessment: III - A                            patient with severe systemic disease. After                            reviewing the risks and benefits, the patient was  deemed in satisfactory condition to undergo the                            procedure.                           After obtaining informed consent, the colonoscope                            was passed under direct vision. Throughout the                            procedure, the patient's blood pressure, pulse, and                            oxygen saturations were monitored continuously. The                            CF  [id number] #[id number] was introduced through the anus                            and advanced to the the cecum, identified by                            appendiceal orifice and ileocecal valve. The                            colonoscopy was performed without difficulty. The                            patient tolerated the procedure well. The quality                            of the bowel preparation was good. The ileocecal                            valve, appendiceal orifice, and rectum were                            photographed. Scope In: 2:10:50 PM Scope Out: 2:20:48 PM Scope Withdrawal Time: 0 hours 6 minutes 56 seconds  Total Procedure Duration: 0 hours 9 minutes 58 seconds  Findings:                 The perianal and digital rectal examinations were                            normal.                           Repeat examination of right colon under NBI                            performed.  The entire examined colon appeared normal on direct                            and retroflexion views. Complications:            No immediate complications. Estimated Blood Loss:     Estimated blood loss: none. Impression:               - The entire examined colon is normal on direct and                            retroflexion views.                           - No specimens collected. Recommendation:           - Patient has a contact number available for                            emergencies. The signs and symptoms of potential                            delayed complications were discussed with the                            patient. Return to normal activities tomorrow.                            Written discharge instructions were provided to the                            patient.                           - Resume previous diet.                           - Resume Eliquis  (apixaban ) at prior dose tomorrow.                           - Repeat colonoscopy in 5 years for  screening                            purposes.                           - See the other procedure note for documentation of                            additional recommendations. Oliva Montecalvo L. Legrand, MD 05/24/2024 2:45:45 PM This report has been signed electronically.

## 2024-05-25 ENCOUNTER — Ambulatory Visit: Payer: Self-pay | Admitting: Gastroenterology

## 2024-05-25 ENCOUNTER — Telehealth: Payer: Self-pay | Admitting: *Deleted

## 2024-05-25 NOTE — Telephone Encounter (Signed)
  Follow up Call-     05/24/2024   12:52 PM  Call back number  Post procedure Call Back phone  # [phone]  Permission to leave phone message Yes     Patient questions:  Do you have a fever, pain , or abdominal swelling? No. Pain Score  0 *  Have you tolerated food without any problems? Yes.    Have you been able to return to your normal activities? Yes.    Do you have any questions about your discharge instructions: Diet   No. Medications  No. Follow up visit  No.  Do you have questions or concerns about your Care? No.  Actions: * If pain score is 4 or above: No action needed, pain <4.

## 2024-05-28 ENCOUNTER — Other Ambulatory Visit: Payer: Self-pay

## 2024-05-28 DIAGNOSIS — D509 Iron deficiency anemia, unspecified: Secondary | ICD-10-CM

## 2024-05-29 LAB — SURGICAL PATHOLOGY

## 2024-05-31 ENCOUNTER — Ambulatory Visit: Payer: Self-pay | Admitting: Gastroenterology

## 2024-06-05 DIAGNOSIS — Z9842 Cataract extraction status, left eye: Secondary | ICD-10-CM | POA: Diagnosis not present

## 2024-06-05 DIAGNOSIS — Z9841 Cataract extraction status, right eye: Secondary | ICD-10-CM | POA: Diagnosis not present

## 2024-06-05 DIAGNOSIS — Z9889 Other specified postprocedural states: Secondary | ICD-10-CM | POA: Diagnosis not present

## 2024-06-05 DIAGNOSIS — H5212 Myopia, left eye: Secondary | ICD-10-CM | POA: Diagnosis not present

## 2024-06-19 ENCOUNTER — Encounter: Admitting: Gastroenterology

## 2024-06-21 ENCOUNTER — Encounter: Payer: Self-pay | Admitting: Family Medicine

## 2024-06-26 ENCOUNTER — Ambulatory Visit: Payer: Self-pay | Admitting: Family Medicine

## 2024-06-26 ENCOUNTER — Encounter: Payer: Self-pay | Admitting: Family Medicine

## 2024-06-26 ENCOUNTER — Ambulatory Visit: Admitting: Family Medicine

## 2024-06-26 VITALS — BP 120/60 | HR 83 | Temp 97.3°F | Ht 64.0 in | Wt 148.1 lb

## 2024-06-26 DIAGNOSIS — K2950 Unspecified chronic gastritis without bleeding: Secondary | ICD-10-CM | POA: Insufficient documentation

## 2024-06-26 DIAGNOSIS — I10 Essential (primary) hypertension: Secondary | ICD-10-CM

## 2024-06-26 DIAGNOSIS — R3989 Other symptoms and signs involving the genitourinary system: Secondary | ICD-10-CM | POA: Insufficient documentation

## 2024-06-26 DIAGNOSIS — D500 Iron deficiency anemia secondary to blood loss (chronic): Secondary | ICD-10-CM | POA: Insufficient documentation

## 2024-06-26 LAB — POC URINALSYSI DIPSTICK (AUTOMATED)
Bilirubin, UA: NEGATIVE
Blood, UA: NEGATIVE
Glucose, UA: NEGATIVE
Ketones, UA: NEGATIVE
Leukocytes, UA: NEGATIVE
Nitrite, UA: POSITIVE
Protein, UA: NEGATIVE
Spec Grav, UA: <=1.005 — AB (ref 1.010–1.025)
Urobilinogen, UA: 0.2 U/dL
pH, UA: 6.5 (ref 5.0–8.0)

## 2024-06-26 LAB — IBC + FERRITIN
Ferritin: 26.4 ng/mL (ref 10.0–291.0)
Iron: 152 ug/dL — ABNORMAL HIGH (ref 42–145)
Saturation Ratios: 38.6 % (ref 20.0–50.0)
TIBC: 393.4 ug/dL (ref 250.0–450.0)
Transferrin: 281 mg/dL (ref 212.0–360.0)

## 2024-06-26 LAB — CBC WITH DIFFERENTIAL/PLATELET
Basophils Absolute: 0 K/uL (ref 0.0–0.1)
Basophils Relative: 0.8 % (ref 0.0–3.0)
Eosinophils Absolute: 0.1 K/uL (ref 0.0–0.7)
Eosinophils Relative: 1.6 % (ref 0.0–5.0)
HCT: 41.2 % (ref 36.0–46.0)
Hemoglobin: 13.6 g/dL (ref 12.0–15.0)
Lymphocytes Relative: 21.4 % (ref 12.0–46.0)
Lymphs Abs: 1 K/uL (ref 0.7–4.0)
MCHC: 33 g/dL (ref 30.0–36.0)
MCV: 95.7 fl (ref 78.0–100.0)
Monocytes Absolute: 0.5 K/uL (ref 0.1–1.0)
Monocytes Relative: 9.5 % (ref 3.0–12.0)
Neutro Abs: 3.2 K/uL (ref 1.4–7.7)
Neutrophils Relative %: 66.7 % (ref 43.0–77.0)
Platelets: 225 K/uL (ref 150.0–400.0)
RBC: 4.3 Mil/uL (ref 3.87–5.11)
RDW: 15 % (ref 11.5–15.5)
WBC: 4.8 K/uL (ref 4.0–10.5)

## 2024-06-26 NOTE — Assessment & Plan Note (Signed)
 Chronic, next line urinalysis unremarkable in office today except for nitrate.  Sample was very dilute.  Patient with frequent UTI but also underlying bladder pressure and pain between urinary tract infections. Send urine for culture. Refer to urology for possible consideration of interstitial cystitis.

## 2024-06-26 NOTE — Assessment & Plan Note (Signed)
Stable, chronic.  Continue current medication.  Well-controlled on losartan 50 mg daily, Cardizem CD 240 mg daily, clonidine 0.1 mg tablet daily and bisoprolol 10 mg p.o. daily

## 2024-06-26 NOTE — Assessment & Plan Note (Signed)
 Acute, previously improving with iron supplement.  Patient continues to take ferrous sulfate 325 mg p.o. daily. Will reevaluate with CBC and iron panel today.  Colonoscopy unremarkable.  Upper endoscopy showed chronic gastritis, possible source of anemia.

## 2024-06-26 NOTE — Assessment & Plan Note (Signed)
 Seen on endoscopy done in July per Dr. Legrand.  Patient on omeprazole  40 mg daily. Possible source of anemia.

## 2024-06-26 NOTE — Addendum Note (Signed)
 Addended by: WENDELL ARLAND RAMAN on: 06/26/2024 09:59 AM   Modules accepted: Orders

## 2024-06-26 NOTE — Progress Notes (Signed)
 Patient ID: Amanda Estrada, female    DOB: [birth year], 63 y.o.   MRN: [id number]  This visit was conducted in person.  BP 120/60   Pulse 83   Temp (!) 97.3 F (36.3 C) (Temporal)   Ht 5' 4 (1.626 m)   Wt 148 lb 2 oz (67.2 kg)   SpO2 94%   BMI 25.43 kg/m    CC:  Chief Complaint  Patient presents with   Bladder Pressure    X 1 year    Subjective:   HPI: Amanda Estrada is a 63 y.o. female with history of atrial fibrillation, hypertension GERD and asthma.  Presenting on 06/26/2024 for Bladder Pressure (X 1 year)    She reports she has been noting bladder pressure and pain when she goes to urinate.  Pain is not where urine flows out but in suprapubic region.  No blood in urine. Urine is darker.  She has nocturia.Amanda Estrada gets up 2-3 times at night. No urinary frequency during the day.  Has to push to get urine out at times. Recent UTI, last was  12/2023, 02/2024.Amanda Estrada Several times in last 6 months... symptoms improve b  Nonsmoker  Always has bladder pressure.   No new medications .  Azo helps some temporarily with bladder pressure.  Hypertension:   BP Readings from Last 3 Encounters:  06/26/24 120/60  05/24/24 (!) 155/75  05/24/24 (!) 176/95  Using medication without problems or lightheadedness:  none Chest pain with exertion: none Edema:none Short of breath: none Average home BPs: Other issues:     Iron deficiency.. likely due to chronic gastritis, colonoscopy nml.   On iron.   On omeprazole .    Relevant past medical, surgical, family and social history reviewed and updated as indicated. Interim medical history since our last visit reviewed. Allergies and medications reviewed and updated. Outpatient Medications Prior to Visit  Medication Sig Dispense Refill   albuterol  (VENTOLIN  HFA) 108 (90 Base) MCG/ACT inhaler Inhale 2 puffs into the lungs every 4 (four) hours as needed for wheezing or shortness of breath. 8 g 2   apixaban  (ELIQUIS ) 5 MG TABS tablet  Take 1 tablet (5 mg total) by mouth 2 (two) times daily. 60 tablet 11   bisoprolol  (ZEBETA ) 10 MG tablet Take 1 tablet (10 mg total) by mouth daily. 90 tablet 3   cloNIDine  (CATAPRES ) 0.1 MG tablet TAKE 1 TABLET BY MOUTH EVERYDAY AT BEDTIME 90 tablet 1   diltiazem  (CARDIZEM  CD) 240 MG 24 hr capsule TAKE 1 CAPSULE BY MOUTH EVERY DAY 90 capsule 3   fluticasone  (FLONASE ) 50 MCG/ACT nasal spray PLACE 1 SPRAY INTO BOTH NOSTRILS DAILY AS NEEDED FOR ALLERGIES. 48 mL 1   levocetirizine (XYZAL) 5 MG tablet Take 5 mg by mouth at bedtime.      losartan  (COZAAR ) 100 MG tablet Take 1 tablet (100 mg total) by mouth daily. 90 tablet 1   montelukast  (SINGULAIR ) 10 MG tablet TAKE 1 TABLET BY MOUTH EVERYDAY AT BEDTIME 90 tablet 3   omeprazole  (PRILOSEC) 40 MG capsule TAKE 1 CAPSULE BY MOUTH TWICE A DAY 180 capsule 3   rosuvastatin  (CRESTOR ) 40 MG tablet Take 40 mg by mouth daily.     tirzepatide  (ZEPBOUND ) 15 MG/0.5ML Pen Inject 15 mg into the skin once a week. 6 mL 1   No facility-administered medications prior to visit.     Per HPI unless specifically indicated in ROS section below Review of Systems  Constitutional:  Negative for fatigue  and fever.  HENT:  Negative for congestion.   Eyes:  Negative for pain.  Respiratory:  Negative for cough and shortness of breath.   Cardiovascular:  Negative for chest pain, palpitations and leg swelling.  Gastrointestinal:  Positive for abdominal pain.  Genitourinary:  Negative for dysuria and vaginal bleeding.  Musculoskeletal:  Negative for back pain.  Neurological:  Negative for syncope, light-headedness and headaches.  Psychiatric/Behavioral:  Negative for dysphoric mood.    Objective:  BP 120/60   Pulse 83   Temp (!) 97.3 F (36.3 C) (Temporal)   Ht 5' 4 (1.626 m)   Wt 148 lb 2 oz (67.2 kg)   SpO2 94%   BMI 25.43 kg/m   Wt Readings from Last 3 Encounters:  06/26/24 148 lb 2 oz (67.2 kg)  05/24/24 149 lb (67.6 kg)  04/09/24 149 lb (67.6 kg)       Physical Exam Constitutional:      General: She is not in acute distress.    Appearance: Normal appearance. She is well-developed. She is not ill-appearing or toxic-appearing.  HENT:     Head: Normocephalic.     Right Ear: Hearing, tympanic membrane, ear canal and external ear normal. Tympanic membrane is not erythematous, retracted or bulging.     Left Ear: Hearing, tympanic membrane, ear canal and external ear normal. Tympanic membrane is not erythematous, retracted or bulging.     Nose: No mucosal edema or rhinorrhea.     Right Sinus: No maxillary sinus tenderness or frontal sinus tenderness.     Left Sinus: No maxillary sinus tenderness or frontal sinus tenderness.     Mouth/Throat:     Pharynx: Uvula midline.  Eyes:     General: Lids are normal. Lids are everted, no foreign bodies appreciated.     Conjunctiva/sclera: Conjunctivae normal.     Pupils: Pupils are equal, round, and reactive to light.  Neck:     Thyroid : No thyroid  mass or thyromegaly.     Vascular: No carotid bruit.     Trachea: Trachea normal.  Cardiovascular:     Rate and Rhythm: Normal rate and regular rhythm.     Pulses: Normal pulses.     Heart sounds: Normal heart sounds, S1 normal and S2 normal. No murmur heard.    No friction rub. No gallop.  Pulmonary:     Effort: Pulmonary effort is normal. No tachypnea or respiratory distress.     Breath sounds: Normal breath sounds. No decreased breath sounds, wheezing, rhonchi or rales.  Abdominal:     General: Bowel sounds are normal.     Palpations: Abdomen is soft.     Tenderness: There is abdominal tenderness in the suprapubic area. There is no right CVA tenderness or left CVA tenderness.  Musculoskeletal:     Cervical back: Normal range of motion and neck supple.  Skin:    General: Skin is warm and dry.     Findings: No rash.  Neurological:     Mental Status: She is alert.  Psychiatric:        Mood and Affect: Mood is not anxious or depressed.         Speech: Speech normal.        Behavior: Behavior normal. Behavior is cooperative.        Thought Content: Thought content normal.        Judgment: Judgment normal.       Results for orders placed or performed in visit on 06/26/24  POCT  Urinalysis Dipstick (Automated)   Collection Time: 06/26/24  9:42 AM  Result Value Ref Range   Color, UA Yellow    Clarity, UA Clear    Glucose, UA Negative Negative   Bilirubin, UA Negative    Ketones, UA Negative    Spec Grav, UA <=1.005 (A) 1.010 - 1.025   Blood, UA Negative    pH, UA 6.5 5.0 - 8.0   Protein, UA Negative Negative   Urobilinogen, UA 0.2 0.2 or 1.0 E.U./dL   Nitrite, UA Positive    Leukocytes, UA Negative Negative    Assessment and Plan  Essential hypertension Assessment & Plan: Stable, chronic.  Continue current medication.  Well-controlled on losartan  50 mg daily, Cardizem  CD 240 mg daily, clonidine  0.1 mg tablet daily and bisoprolol  10 mg p.o. daily   Sensation of pressure in bladder area Assessment & Plan: Chronic, next line urinalysis unremarkable in office today except for nitrate.  Sample was very dilute.  Patient with frequent UTI but also underlying bladder pressure and pain between urinary tract infections. Send urine for culture. Refer to urology for possible consideration of interstitial cystitis.  Orders: -     POCT Urinalysis Dipstick (Automated) -     Ambulatory referral to Urology  Iron deficiency anemia due to chronic blood loss Assessment & Plan: Acute, previously improving with iron supplement.  Patient continues to take ferrous sulfate 325 mg p.o. daily. Will reevaluate with CBC and iron panel today.  Colonoscopy unremarkable.  Upper endoscopy showed chronic gastritis, possible source of anemia.  Orders: -     CBC with Differential/Platelet -     IBC + Ferritin  Chronic gastritis, presence of bleeding unspecified, unspecified gastritis type Assessment & Plan: Seen on endoscopy done in July  per Dr. Legrand.  Patient on omeprazole  40 mg daily. Possible source of anemia.      No follow-ups on file.   Greig Ring, MD

## 2024-06-27 NOTE — Telephone Encounter (Signed)
 Agree with stopping iron at this point.  CBC and iron studies need to be checked again with either PCP or our lab (whichever is more convenient) in 2 months.  - HD

## 2024-06-29 LAB — URINE CULTURE
MICRO NUMBER:: 16852192
SPECIMEN QUALITY:: ADEQUATE

## 2024-06-29 MED ORDER — SULFAMETHOXAZOLE-TRIMETHOPRIM 800-160 MG PO TABS: 1.0000 | ORAL_TABLET | Freq: Two times a day (BID) | ORAL | 0 refills | Status: DC

## 2024-07-13 DIAGNOSIS — R102 Pelvic and perineal pain: Secondary | ICD-10-CM | POA: Diagnosis not present

## 2024-07-13 DIAGNOSIS — R351 Nocturia: Secondary | ICD-10-CM | POA: Diagnosis not present

## 2024-07-13 DIAGNOSIS — R3915 Urgency of urination: Secondary | ICD-10-CM | POA: Diagnosis not present

## 2024-07-13 DIAGNOSIS — N3020 Other chronic cystitis without hematuria: Secondary | ICD-10-CM | POA: Diagnosis not present

## 2024-07-19 DIAGNOSIS — M5414 Radiculopathy, thoracic region: Secondary | ICD-10-CM | POA: Diagnosis not present

## 2024-07-19 DIAGNOSIS — M5124 Other intervertebral disc displacement, thoracic region: Secondary | ICD-10-CM | POA: Diagnosis not present

## 2024-07-19 DIAGNOSIS — M4804 Spinal stenosis, thoracic region: Secondary | ICD-10-CM | POA: Diagnosis not present

## 2024-08-17 ENCOUNTER — Encounter: Payer: Self-pay | Admitting: Family Medicine

## 2024-08-18 ENCOUNTER — Emergency Department (HOSPITAL_COMMUNITY)

## 2024-08-18 ENCOUNTER — Emergency Department (HOSPITAL_COMMUNITY): Admission: EM | Admit: 2024-08-18 | Discharge: 2024-08-18 | Disposition: A | Discharge status: Home / Self Care

## 2024-08-18 ENCOUNTER — Encounter (HOSPITAL_COMMUNITY): Payer: Self-pay | Admitting: Emergency Medicine

## 2024-08-18 ENCOUNTER — Other Ambulatory Visit: Payer: Self-pay

## 2024-08-18 DIAGNOSIS — R9082 White matter disease, unspecified: Secondary | ICD-10-CM | POA: Diagnosis not present

## 2024-08-18 DIAGNOSIS — S50311A Abrasion of right elbow, initial encounter: Secondary | ICD-10-CM | POA: Diagnosis not present

## 2024-08-18 DIAGNOSIS — S42001A Fracture of unspecified part of right clavicle, initial encounter for closed fracture: Secondary | ICD-10-CM | POA: Diagnosis not present

## 2024-08-18 DIAGNOSIS — S0990XA Unspecified injury of head, initial encounter: Secondary | ICD-10-CM | POA: Insufficient documentation

## 2024-08-18 DIAGNOSIS — S4991XA Unspecified injury of right shoulder and upper arm, initial encounter: Secondary | ICD-10-CM | POA: Diagnosis not present

## 2024-08-18 DIAGNOSIS — Z7901 Long term (current) use of anticoagulants: Secondary | ICD-10-CM | POA: Insufficient documentation

## 2024-08-18 DIAGNOSIS — W19XXXA Unspecified fall, initial encounter: Secondary | ICD-10-CM | POA: Diagnosis not present

## 2024-08-18 DIAGNOSIS — M7989 Other specified soft tissue disorders: Secondary | ICD-10-CM | POA: Diagnosis not present

## 2024-08-18 DIAGNOSIS — S42031A Displaced fracture of lateral end of right clavicle, initial encounter for closed fracture: Secondary | ICD-10-CM | POA: Insufficient documentation

## 2024-08-18 MED ORDER — ONDANSETRON 4 MG PO TBDP: 4.0000 mg | ORAL_TABLET | Freq: Three times a day (TID) | ORAL | 0 refills | Status: DC | PRN

## 2024-08-18 MED ORDER — OXYCODONE-ACETAMINOPHEN 5-325 MG PO TABS
1.0000 | ORAL_TABLET | Freq: Once | ORAL | Status: AC
  Administered 2024-08-18: 1 via ORAL
  Filled 2024-08-18: qty 1

## 2024-08-18 MED ORDER — ONDANSETRON 8 MG PO TBDP
8.0000 mg | ORAL_TABLET | Freq: Once | ORAL | Status: AC
  Administered 2024-08-18: 8 mg via ORAL
  Filled 2024-08-18: qty 1

## 2024-08-18 MED ORDER — OXYCODONE-ACETAMINOPHEN 5-325 MG PO TABS: 1.0000 | ORAL_TABLET | Freq: Four times a day (QID) | ORAL | 0 refills | Status: DC | PRN

## 2024-08-18 NOTE — Progress Notes (Signed)
 Orthopedic Tech Progress Note Patient Details:  Amanda Estrada 03/14/1961 [id number]  Ortho Devices Type of Ortho Device: Shoulder immobilizer Ortho Device/Splint Location: RUE Ortho Device/Splint Interventions: Ordered, Application, Adjustment   Post Interventions Patient Tolerated: Well Instructions Provided: Care of device, Adjustment of device  Adine [name] 08/18/2024, 1:57 PM

## 2024-08-18 NOTE — ED Triage Notes (Signed)
 Pt reports falling last night when she stood up from the bed. Denies LOC, denies hitting her head. PT reports pain on her right clavicle.

## 2024-08-18 NOTE — Discharge Instructions (Addendum)
 Today you were seen for a right clavicle fracture.  Please wear your sling except for when showering.  Please follow-up with orthopedics for further evaluation and workup.  Please pick up your medication take as prescribed.  You may take Tylenol  as needed for mild to moderate pain.  Thank you for letting us  treat you today. After reviewing your labs and imaging, I feel you are safe to go home. Please follow up with your PCP in the next several days and provide them with your records from this visit. Return to the Emergency Room if pain becomes severe or symptoms worsen.

## 2024-08-18 NOTE — ED Provider Notes (Signed)
  EMERGENCY DEPARTMENT AT [facility] Provider Note   CSN: [id number] Arrival date & time: 08/18/24  1159     Patient presents with: Amanda Estrada is a 63 y.o. female presents today after falling last night.  Patient reports pain in her right collarbone, right elbow, along with mild nausea.  Patient is unsure of any head injury.  Patient denies vomiting, LOC, shortness of breath, diplopia, tinnitus, numbness, or weakness.    Fall       Prior to Admission medications   Medication Sig Start Date End Date Taking? Authorizing Provider  ondansetron  (ZOFRAN -ODT) 4 MG disintegrating tablet Take 1 tablet (4 mg total) by mouth every 8 (eight) hours as needed for nausea or vomiting. 08/18/24  Yes Mika Griffitts N, PA-C  oxyCODONE -acetaminophen  (PERCOCET/ROXICET) 5-325 MG tablet Take 1 tablet by mouth every 6 (six) hours as needed for severe pain (pain score 7-10). 08/18/24  Yes Fatumata Kashani N, PA-C  albuterol  (VENTOLIN  HFA) 108 (90 Base) MCG/ACT inhaler Inhale 2 puffs into the lungs every 4 (four) hours as needed for wheezing or shortness of breath. 07/09/20   Bedsole, Amy E, MD  apixaban  (ELIQUIS ) 5 MG TABS tablet Take 1 tablet (5 mg total) by mouth 2 (two) times daily. 02/08/22   [facility], Jon Garre, PA  bisoprolol  (ZEBETA ) 10 MG tablet Take 1 tablet (10 mg total) by mouth daily. 02/10/22   Cindie Delon POUR, PA-C  cloNIDine  (CATAPRES ) 0.1 MG tablet TAKE 1 TABLET BY MOUTH EVERYDAY AT BEDTIME 11/19/22   Lavona Agent, MD  diltiazem  (CARDIZEM  CD) 240 MG 24 hr capsule TAKE 1 CAPSULE BY MOUTH EVERY DAY 02/22/22   Lavona Agent, MD  fluticasone  (FLONASE ) 50 MCG/ACT nasal spray PLACE 1 SPRAY INTO BOTH NOSTRILS DAILY AS NEEDED FOR ALLERGIES. 11/25/23   Bedsole, Amy E, MD  levocetirizine (XYZAL) 5 MG tablet Take 5 mg by mouth at bedtime.     [provider]  losartan  (COZAAR ) 100 MG tablet Take 1 tablet (100 mg total) by mouth daily. 04/28/23   Bedsole, Amy E, MD   montelukast  (SINGULAIR ) 10 MG tablet TAKE 1 TABLET BY MOUTH EVERYDAY AT BEDTIME 12/02/23   Bedsole, Amy E, MD  omeprazole  (PRILOSEC) 40 MG capsule TAKE 1 CAPSULE BY MOUTH TWICE A DAY 12/02/23   Bedsole, Amy E, MD  rosuvastatin  (CRESTOR ) 40 MG tablet Take 40 mg by mouth daily. 09/07/22   [provider]  sulfamethoxazole -trimethoprim  (BACTRIM  DS) 800-160 MG tablet Take 1 tablet by mouth 2 (two) times daily. 06/29/24   Bedsole, Amy E, MD  tirzepatide  (ZEPBOUND ) 15 MG/0.5ML Pen Inject 15 mg into the skin once a week. 05/17/24   Avelina Greig BRAVO, MD    Allergies: Amoxicillin -pot clavulanate    Review of Systems  Musculoskeletal:  Positive for arthralgias.    Updated Vital Signs BP (!) 153/78 (BP Location: Left Arm)   Pulse 90   Temp 98 F (36.7 C) (Oral)   Resp 16   SpO2 100%   Physical Exam Vitals and nursing note reviewed.  Constitutional:      General: She is not in acute distress.    Appearance: She is well-developed.  HENT:     Head: Normocephalic and atraumatic.     Right Ear: External ear normal.     Left Ear: External ear normal.  Eyes:     Extraocular Movements: Extraocular movements intact.     Conjunctiva/sclera: Conjunctivae normal.     Pupils: Pupils are equal, round,  and reactive to light.  Cardiovascular:     Rate and Rhythm: Normal rate and regular rhythm.     Pulses: Normal pulses.     Heart sounds: Normal heart sounds. No murmur heard. Pulmonary:     Effort: Pulmonary effort is normal. No respiratory distress.     Breath sounds: Normal breath sounds.  Abdominal:     Palpations: Abdomen is soft.     Tenderness: There is no abdominal tenderness.  Musculoskeletal:        General: Swelling and deformity present.     Cervical back: Neck supple.     Comments: Patient with obvious deformity, swelling, and ecchymosis noted to her right clavicle with associated tenderness to palpation.  Patient also has tenderness to palpation and small abrasion on her right  lateral epicondyle of the right elbow.  Patient denies tenderness to palpation of the humeral head.  Patient is neurovascularly intact.  +2 radial pulses.  Skin:    General: Skin is warm and dry.     Capillary Refill: Capillary refill takes less than 2 seconds.     Findings: Bruising present.  Neurological:     General: No focal deficit present.     Mental Status: She is alert and oriented to person, place, and time.  Psychiatric:        Mood and Affect: Mood normal.     (all labs ordered are listed, but only abnormal results are displayed) Labs Reviewed - No data to display  EKG: None  Radiology: DG Elbow Complete Right Result Date: 08/18/2024 CLINICAL DATA:  Status post fall. EXAM: RIGHT ELBOW - COMPLETE 3+ VIEW COMPARISON:  None Available. FINDINGS: There is no evidence of an acute fracture, dislocation, or joint effusion. There is no evidence of arthropathy or other focal bone abnormality. Soft tissues are unremarkable. IMPRESSION: Negative. Electronically Signed   By: Suzen Dials M.D.   On: 08/18/2024 13:28   DG Clavicle Right Result Date: 08/18/2024 CLINICAL DATA:  Status post fall. EXAM: RIGHT CLAVICLE - 2+ VIEWS COMPARISON:  None Available. FINDINGS: An acute, comminuted fracture deformity is seen involving the distal right clavicle with dorsal angulation of the fracture apex. There is no evidence of dislocation. A cortical irregularity of indeterminate age is seen involving the right humeral head. Soft tissues are unremarkable. IMPRESSION: 1. Acute fracture of the distal right clavicle. 2. Cortical irregularity of indeterminate age involving the right humeral head. Correlation with physical examination is recommended to determine the presence of point tenderness. Electronically Signed   By: Suzen Dials M.D.   On: 08/18/2024 13:27   CT Cervical Spine Wo Contrast Result Date: 08/18/2024 EXAM: CT CERVICAL SPINE WITHOUT CONTRAST 08/18/2024 12:52:52 PM TECHNIQUE: CT of  the cervical spine was performed without the administration of intravenous contrast. Multiplanar reformatted images are provided for review. Automated exposure control, iterative reconstruction, and/or weight based adjustment of the mA/kV was utilized to reduce the radiation dose to as low as reasonably achievable. COMPARISON: Face CT 06/04/2010. CLINICAL HISTORY: 63 year old female. Polytrauma, blunt. Patient reports falling last night and pain on her right clavicle. FINDINGS: CERVICAL SPINE: BONES AND ALIGNMENT: No acute fracture or traumatic malalignment. Prior cervical ACDF at C5-C6 with solid arthrodesis. Hyperostosis with bulky anterior endplate osteophytes but no other interbody ankylosis. DEGENERATIVE CHANGES: Bulky chronic disc and endplate degeneration at the adjacent segments and also C3-C4. SOFT TISSUES: No prevertebral soft tissue swelling. Abnormal correction partially visible right clavicle fracture with patchy and confluent surrounding soft tissue hematoma/contusion.  See series 9 image 71 and coronal image 24. The clavicle fracture appears to be fairly long segment, oblique, and displaced more than 1 full shaft width. Contralateral left neck soft tissues appear negative. Calcified carotid atherosclerosis in the neck. Visible upper ribs and thoracic levels appear intact. Negative visible lung apices aside from mild scarring. IMPRESSION: 1. No acute traumatic injury identified in the cervical spine. 2. Partially visible displaced right clavicle fracture with extensive surrounding soft tissue hematoma/contusion. 3. Advanced cervical spine degeneration superimposed on chronic C5-C6 ACDF. Electronically signed by: Helayne Hurst MD 08/18/2024 01:01 PM EDT RP Workstation: [id number]   CT Head Wo Contrast Result Date: 08/18/2024 EXAM: CT HEAD WITHOUT CONTRAST 08/18/2024 12:52:52 PM TECHNIQUE: CT of the head was performed without the administration of intravenous contrast. Automated exposure control,  iterative reconstruction, and/or weight based adjustment of the mA/kV was utilized to reduce the radiation dose to as low as reasonably achievable. COMPARISON: Brain MRI 03/15/2024. CLINICAL HISTORY: 63 year old female. Head trauma, coagulopathy. Patient reports falling last night, denies LOC or hitting head, reports right clavicle pain. FINDINGS: BRAIN AND VENTRICLES: Brain volume is normal for age. No acute hemorrhage. No evidence of acute infarct. No hydrocephalus. No extra-axial collection. No mass effect or midline shift. Calcified atherosclerosis at the skull base is extensive. Patchy, and confluent bilateral cerebral white matter hypodensity. No suspicious intracranial vascular hyperdensity. ORBITS: No acute abnormality. SINUSES: No acute abnormality. SOFT TISSUES AND SKULL: No acute soft tissue abnormality. No skull fracture. IMPRESSION: 1. No acute traumatic injury identified 2. Chronic white matter disease, most commonly due to small vessel ischemia. Electronically signed by: Helayne Hurst MD 08/18/2024 12:57 PM EDT RP Workstation: [id number]     Procedures   Medications Ordered in the ED  oxyCODONE -acetaminophen  (PERCOCET/ROXICET) 5-325 MG per tablet 1 tablet (1 tablet Oral Given 08/18/24 1308)  ondansetron  (ZOFRAN -ODT) disintegrating tablet 8 mg (8 mg Oral Given 08/18/24 1309)                                    Medical Decision Making Amount and/or Complexity of Data Reviewed Radiology: ordered.  Risk Prescription drug management.   This patient presents to the ED for concern of mechanical fall differential diagnosis includes brain bleed, head injury, C-spine injury, clavicle fracture, shoulder dislocation, musculoskeletal pain   Imaging Studies ordered:  I ordered imaging studies including CT head and C-spine Noncon I independently visualized and interpreted imaging which showed no acute traumatic injury identified in the C-spine.  No acute traumatic injury identified  intracranially I agree with the radiologist interpretation Right clavicle x-ray which showed acute fracture of the distal right clavicle.  Cortical irregularity of indeterminate age involving the right humeral head correlate with physical exam Right elbow x-ray which showed negative   Medicines ordered and prescription drug management:  I ordered medication including Percocet and Zofran     I have reviewed the patients home medicines and have made adjustments as needed   Problem List / ED Course:  Patient placed in sling Considered for admission and further workup however patient's vital signs, physical exam, and imaging are reassuring.  Patient symptoms likely due to acute right clavicle fracture.  Patient given analgesic and antiemetic course outpatient and advised to follow-up with orthopedics for further evaluation workup.  If patient safe for discharge at this time.      Final diagnoses:  Closed displaced fracture of acromial end of right clavicle, initial encounter  ED Discharge Orders          Ordered    oxyCODONE -acetaminophen  (PERCOCET/ROXICET) 5-325 MG tablet  Every 6 hours PRN        08/18/24 1353    ondansetron  (ZOFRAN -ODT) 4 MG disintegrating tablet  Every 8 hours PRN        08/18/24 1353               Francis Ileana SAILOR, PA-C 08/18/24 1353    Ula Prentice SAUNDERS, MD 08/18/24 1452

## 2024-08-22 DIAGNOSIS — S42021A Displaced fracture of shaft of right clavicle, initial encounter for closed fracture: Secondary | ICD-10-CM | POA: Diagnosis not present

## 2024-09-05 ENCOUNTER — Other Ambulatory Visit: Payer: Self-pay | Admitting: Family Medicine

## 2024-09-05 MED ORDER — ALENDRONATE SODIUM 70 MG PO TABS: 70.0000 mg | ORAL_TABLET | ORAL | 11 refills | Status: AC

## 2024-09-10 DIAGNOSIS — S42021D Displaced fracture of shaft of right clavicle, subsequent encounter for fracture with routine healing: Secondary | ICD-10-CM | POA: Diagnosis not present

## 2024-09-11 ENCOUNTER — Other Ambulatory Visit: Payer: Self-pay | Admitting: Family Medicine

## 2024-09-18 ENCOUNTER — Ambulatory Visit: Payer: BC Managed Care – PPO | Admitting: Dermatology

## 2024-10-02 DIAGNOSIS — S42021D Displaced fracture of shaft of right clavicle, subsequent encounter for fracture with routine healing: Secondary | ICD-10-CM | POA: Diagnosis not present

## 2024-10-09 ENCOUNTER — Ambulatory Visit: Admitting: Dermatology

## 2024-10-12 ENCOUNTER — Ambulatory Visit: Admitting: Family Medicine

## 2024-10-12 ENCOUNTER — Telehealth: Payer: Self-pay

## 2024-10-12 ENCOUNTER — Encounter: Payer: Self-pay | Admitting: Family Medicine

## 2024-10-12 VITALS — BP 138/70 | HR 69 | Temp 98.0°F | Ht 64.0 in | Wt 153.2 lb

## 2024-10-12 DIAGNOSIS — J0140 Acute pansinusitis, unspecified: Secondary | ICD-10-CM | POA: Diagnosis not present

## 2024-10-12 DIAGNOSIS — H6993 Unspecified Eustachian tube disorder, bilateral: Secondary | ICD-10-CM | POA: Insufficient documentation

## 2024-10-12 DIAGNOSIS — J4530 Mild persistent asthma, uncomplicated: Secondary | ICD-10-CM | POA: Diagnosis not present

## 2024-10-12 DIAGNOSIS — R197 Diarrhea, unspecified: Secondary | ICD-10-CM

## 2024-10-12 MED ORDER — PREDNISONE 20 MG PO TABS: ORAL_TABLET | ORAL | 0 refills | Status: DC

## 2024-10-12 NOTE — Progress Notes (Signed)
 Patient ID: Amanda Estrada, female    DOB: 07-03-1961, 63 y.o.   MRN: [id number]  This visit was conducted in person.  BP 138/70   Pulse 69   Temp 98 F (36.7 C) (Oral)   Ht 5' 4 (1.626 m)   Wt 153 lb 4 oz (69.5 kg)   SpO2 100%   BMI 26.31 kg/m    CC:  Chief Complaint  Patient presents with   Cough    X 1 week with green phlegm   Sore Throat   Ear Fullness   Diarrhea    Since October     Subjective:   HPI: Amanda Estrada is a 63 y.o. female presenting on 10/12/2024 for Cough (X 1 week with green phlegm), Sore Throat, Ear Fullness, and Diarrhea (Since October/)   Date of onset: 1 week Initial symptoms included  runny nose Symptoms progressed to green nasal congestion, productive cough green mucus.  No face pain  Head and ear fullness, decreased hearing.   No wheeze, no SOB.  Low grade fever in last few days.. 100.5 F  Sick contacts:  husband with pneumonia COVID testing:   none     She has tried to treat with  dayquil and flonase .  Albuterol   minimaluse.     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 Non-smoker.    She has also noted diarrhea since 08/2024 since 2 days after fosamax .  Held this but it continued.  Has tried stopping magnesium but it continue. 4-5 stools a day, occ 1-2 at night, water loss.    Has been on  antibiotics for chronic cystitis.. has been on for 30 days. Per Urology Dr. Elisabeth  For chronic cystitis.[name] improve with estrogen cream and cephalexin  250 mg daily.  Reviewed last OV.    Relevant past medical, surgical, family and social history reviewed and updated as indicated. Interim medical history since our last visit reviewed. Allergies and medications reviewed and updated. Outpatient Medications Prior to Visit  Medication Sig Dispense Refill   albuterol  (VENTOLIN  HFA) 108 (90 Base) MCG/ACT inhaler  Inhale 2 puffs into the lungs every 4 (four) hours as needed for wheezing or shortness of breath. 8 g 2   alendronate  (FOSAMAX ) 70 MG tablet Take 1 tablet (70 mg total) by mouth every 7 (seven) days. Take with a full glass of water on an empty stomach. 4 tablet 11   apixaban  (ELIQUIS ) 5 MG TABS tablet Take 1 tablet (5 mg total) by mouth 2 (two) times daily. 60 tablet 11   bisoprolol  (ZEBETA ) 10 MG tablet Take 1 tablet (10 mg total) by mouth daily. 90 tablet 3   cloNIDine  (CATAPRES ) 0.1 MG tablet TAKE 1 TABLET BY MOUTH EVERYDAY AT BEDTIME 90 tablet 1   diltiazem  (CARDIZEM  CD) 240 MG 24 hr capsule TAKE 1 CAPSULE BY MOUTH EVERY DAY 90 capsule 3   fluticasone  (FLONASE ) 50 MCG/ACT nasal spray PLACE 1 SPRAY INTO BOTH NOSTRILS DAILY AS NEEDED FOR ALLERGIES. 48 mL 1   levocetirizine (XYZAL) 5 MG tablet Take 5 mg by mouth at bedtime.      losartan  (COZAAR ) 100 MG tablet Take 1 tablet (100 mg total) by mouth daily. 90 tablet 1   montelukast  (SINGULAIR ) 10 MG tablet TAKE 1 TABLET BY MOUTH EVERYDAY AT BEDTIME 90 tablet 3   omeprazole  (PRILOSEC) 40 MG capsule TAKE 1 CAPSULE BY MOUTH TWICE  A DAY 180 capsule 3   rosuvastatin  (CRESTOR ) 40 MG tablet Take 40 mg by mouth daily.     tirzepatide  (ZEPBOUND ) 15 MG/0.5ML Pen Inject 15 mg into the skin once a week. 6 mL 1   ondansetron  (ZOFRAN -ODT) 4 MG disintegrating tablet Take 1 tablet (4 mg total) by mouth every 8 (eight) hours as needed for nausea or vomiting. 20 tablet 0   oxyCODONE -acetaminophen  (PERCOCET/ROXICET) 5-325 MG tablet Take 1 tablet by mouth every 6 (six) hours as needed for severe pain (pain score 7-10). 15 tablet 0   sulfamethoxazole -trimethoprim  (BACTRIM  DS) 800-160 MG tablet Take 1 tablet by mouth 2 (two) times daily. 6 tablet 0   No facility-administered medications prior to visit.     Per HPI unless specifically indicated in ROS section below Review of Systems  Constitutional:  Negative for fatigue and fever.  HENT:  Positive for congestion and  ear pain. Negative for ear discharge, sinus pressure and sinus pain.   Eyes:  Negative for pain.  Respiratory:  Positive for cough. Negative for shortness of breath.   Cardiovascular:  Negative for chest pain, palpitations and leg swelling.  Gastrointestinal:  Negative for abdominal pain.  Genitourinary:  Negative for dysuria and vaginal bleeding.  Musculoskeletal:  Negative for back pain.  Neurological:  Negative for syncope, light-headedness and headaches.  Psychiatric/Behavioral:  Negative for dysphoric mood.    Objective:  BP 138/70   Pulse 69   Temp 98 F (36.7 C) (Oral)   Ht 5' 4 (1.626 m)   Wt 153 lb 4 oz (69.5 kg)   SpO2 100%   BMI 26.31 kg/m   Wt Readings from Last 3 Encounters:  10/12/24 153 lb 4 oz (69.5 kg)  06/26/24 148 lb 2 oz (67.2 kg)  05/24/24 149 lb (67.6 kg)      Physical Exam Constitutional:      General: She is not in acute distress.    Appearance: She is well-developed. She is not ill-appearing or toxic-appearing.  HENT:     Head: Normocephalic.     Right Ear: Hearing, ear canal and external ear normal. A middle ear effusion is present. Tympanic membrane is erythematous. Tympanic membrane is not retracted or bulging.     Left Ear: Hearing, ear canal and external ear normal. A middle ear effusion is present. Tympanic membrane is erythematous. Tympanic membrane is not retracted or bulging.     Ears:     Comments: Only very mild erythema and no specific ear pain just pressure    Nose: Mucosal edema and congestion present. No rhinorrhea.     Right Sinus: No maxillary sinus tenderness or frontal sinus tenderness.     Left Sinus: No maxillary sinus tenderness or frontal sinus tenderness.     Mouth/Throat:     Pharynx: Uvula midline.  Eyes:     General: Lids are normal. Lids are everted, no foreign bodies appreciated.     Conjunctiva/sclera: Conjunctivae normal.     Pupils: Pupils are equal, round, and reactive to light.  Neck:     Thyroid : No thyroid   mass or thyromegaly.     Vascular: No carotid bruit.     Trachea: Trachea normal.  Cardiovascular:     Rate and Rhythm: Normal rate and regular rhythm.     Pulses: Normal pulses.     Heart sounds: Normal heart sounds, S1 normal and S2 normal. No murmur heard.    No friction rub. No gallop.  Pulmonary:     Effort:  Pulmonary effort is normal. No tachypnea or respiratory distress.     Breath sounds: Normal breath sounds. No decreased breath sounds, wheezing, rhonchi or rales.  Musculoskeletal:     Cervical back: Normal range of motion and neck supple.  Skin:    General: Skin is warm and dry.     Findings: No rash.  Neurological:     Mental Status: She is alert.  Psychiatric:        Mood and Affect: Mood is not anxious or depressed.        Speech: Speech normal.        Behavior: Behavior normal. Behavior is cooperative.        Judgment: Judgment normal.       Results for orders placed or performed in visit on 06/26/24  POCT Urinalysis Dipstick (Automated)   Collection Time: 06/26/24  9:42 AM  Result Value Ref Range   Color, UA Yellow    Clarity, UA Clear    Glucose, UA Negative Negative   Bilirubin, UA Negative    Ketones, UA Negative    Spec Grav, UA <=1.005 (A) 1.010 - 1.025   Blood, UA Negative    pH, UA 6.5 5.0 - 8.0   Protein, UA Negative Negative   Urobilinogen, UA 0.2 0.2 or 1.0 E.U./dL   Nitrite, UA Positive    Leukocytes, UA Negative Negative  Urine Culture   Collection Time: 06/26/24  9:59 AM   Specimen: Blood  Result Value Ref Range   MICRO NUMBER: [id number]    SPECIMEN QUALITY: Adequate    Sample Source NOT GIVEN    STATUS: FINAL    ISOLATE 1: Klebsiella pneumoniae (A)       Susceptibility   Klebsiella pneumoniae - URINE CULTURE, REFLEX    AMOX/CLAVULANIC <=2 Sensitive     AMPICILLIN/SULBACTAM 4 Sensitive     CEFAZOLIN* 2 Not Reportable      * For infections other than uncomplicated UTI caused by E. coli, K. pneumoniae or P. mirabilis: Cefazolin is  resistant if MIC > or = 8 mcg/mL. (Distinguishing susceptible versus intermediate for isolates with MIC < or = 4 mcg/mL requires additional testing.) For uncomplicated UTI caused by E. coli, K. pneumoniae or P. mirabilis: Cefazolin is susceptible if MIC <32 mcg/mL and predicts susceptible to the oral agents cefaclor, cefdinir, cefpodoxime, cefprozil, cefuroxime, cephalexin  and loracarbef.     CEFTAZIDIME <=0.5 Sensitive     CEFEPIME <=0.12 Sensitive     CEFTRIAXONE <=0.25 Sensitive     CIPROFLOXACIN  <=0.06 Sensitive     LEVOFLOXACIN  <=0.12 Sensitive     GENTAMICIN <=1 Sensitive     IMIPENEM <=0.25 Sensitive     MEROPENEM <=0.25 Sensitive     NITROFURANTOIN  64 Intermediate     PIP/TAZO <=4 Sensitive     TRIMETH /SULFA * <=20 Sensitive      * For infections other than uncomplicated UTI caused by E. coli, K. pneumoniae or P. mirabilis: Cefazolin is resistant if MIC > or = 8 mcg/mL. (Distinguishing susceptible versus intermediate for isolates with MIC < or = 4 mcg/mL requires additional testing.) For uncomplicated UTI caused by E. coli, K. pneumoniae or P. mirabilis: Cefazolin is susceptible if MIC <32 mcg/mL and predicts susceptible to the oral agents cefaclor, cefdinir, cefpodoxime, cefprozil, cefuroxime, cephalexin  and loracarbef. Legend: S = Susceptible  I = Intermediate R = Resistant  NS = Not susceptible SDD = Susceptible Dose Dependent * = Not Tested  NR = Not Reported **NN = See Therapy Comments  CBC with Differential/Platelet   Collection Time: 06/26/24  9:59 AM  Result Value Ref Range   WBC 4.8 4.0 - 10.5 K/uL   RBC 4.30 3.87 - 5.11 Mil/uL   Hemoglobin 13.6 12.0 - 15.0 g/dL   HCT 58.7 63.9 - 53.9 %   MCV 95.7 78.0 - 100.0 fl   MCHC 33.0 30.0 - 36.0 g/dL   RDW 84.9 88.4 - 84.4 %   Platelets 225.0 150.0 - 400.0 K/uL   Neutrophils Relative % 66.7 43.0 - 77.0 %   Lymphocytes Relative 21.4 12.0 - 46.0 %   Monocytes Relative 9.5 3.0 - 12.0 %   Eosinophils  Relative 1.6 0.0 - 5.0 %   Basophils Relative 0.8 0.0 - 3.0 %   Neutro Abs 3.2 1.4 - 7.7 K/uL   Lymphs Abs 1.0 0.7 - 4.0 K/uL   Monocytes Absolute 0.5 0.1 - 1.0 K/uL   Eosinophils Absolute 0.1 0.0 - 0.7 K/uL   Basophils Absolute 0.0 0.0 - 0.1 K/uL  IBC + Ferritin   Collection Time: 06/26/24  9:59 AM  Result Value Ref Range   Iron 152 (H) 42 - 145 ug/dL   Transferrin 718.9 787.9 - 360.0 mg/dL   Saturation Ratios 61.3 20.0 - 50.0 %   Ferritin 26.4 10.0 - 291.0 ng/mL   TIBC 393.4 250.0 - 450.0 mcg/dL    Assessment and Plan  Acute diarrhea Assessment & Plan: Ongoing approximately a month.  Patient has been on antibiotics for the last 30 days which may be contributing to imbalance of bacteria in bowels.  Recommended stopping antibiotics (she is almost done) and starting probiotic such as Citrucel or align.  If symptoms not improving as expected she will let me know and we can move ahead with GI pathogen panel and C. difficile test.   Dysfunction of both eustachian tubes Assessment & Plan: Acute, secondary to current viral sinusitis. No clear sign of bacterial infection. Will start with prednisone  taper and nasal saline, Flonase  2 sprays per nostril daily.  If no improvement will have low threshold for starting additional antibiotic given slight redness of TMs in office today.  Did not start antibiotics given current GI issues from current urologic antibiotic.   Acute non-recurrent pansinusitis Assessment & Plan: Acute, no sign of bacterial sinus infection.  Most likely viral.   Mild persistent asthma without complication Assessment & Plan: No ongoing asthma exacerbation.  Patient has albuterol  to use as needed.   Other orders -     predniSONE ; 3 tabs by mouth daily x 3 days, then 2 tabs by mouth daily x 2 days then 1 tab by mouth daily x 2 days  Dispense: 15 tablet; Refill: 0    No follow-ups on file.   Greig Ring, MD

## 2024-10-12 NOTE — Assessment & Plan Note (Signed)
 Ongoing approximately a month.  Patient has been on antibiotics for the last 30 days which may be contributing to imbalance of bacteria in bowels.  Recommended stopping antibiotics (she is almost done) and starting probiotic such as Citrucel or align.  If symptoms not improving as expected she will let me know and we can move ahead with GI pathogen panel and C. difficile test.

## 2024-10-12 NOTE — Assessment & Plan Note (Signed)
 Acute, secondary to current viral sinusitis. No clear sign of bacterial infection. Will start with prednisone  taper and nasal saline, Flonase  2 sprays per nostril daily.  If no improvement will have low threshold for starting additional antibiotic given slight redness of TMs in office today.  Did not start antibiotics given current GI issues from current urologic antibiotic.

## 2024-10-12 NOTE — Assessment & Plan Note (Signed)
 Acute, no sign of bacterial sinus infection.  Most likely viral.

## 2024-10-12 NOTE — Assessment & Plan Note (Signed)
 No ongoing asthma exacerbation.  Patient has albuterol  to use as needed.

## 2024-10-12 NOTE — Telephone Encounter (Signed)
 Spoke with Amanda Estrada.  She states she got her Rx.

## 2024-10-12 NOTE — Telephone Encounter (Signed)
 Copied from CRM #[id number]. Topic: Clinical - Prescription Issue >> Oct 12, 2024  3:12 PM Franky GRADE wrote: Reason for CRM: Patient is currently at the pharmacy to pick up predniSONE  (DELTASONE ) 20 MG tablet [[id number]]; however, they informed patient they have not received a prescription for the patient.

## 2024-10-15 ENCOUNTER — Telehealth: Payer: Self-pay | Admitting: Family Medicine

## 2024-10-15 ENCOUNTER — Encounter: Payer: Self-pay | Admitting: Family Medicine

## 2024-10-15 NOTE — Telephone Encounter (Signed)
 Copied from CRM [id number]. Topic: Clinical - Medication Question >> Oct 15, 2024  2:06 PM Delon DASEN wrote: Reason for CRM: Patient returning call, she does want an antibiotic called in- [phone]

## 2024-10-16 DIAGNOSIS — H6693 Otitis media, unspecified, bilateral: Secondary | ICD-10-CM | POA: Diagnosis not present

## 2024-10-16 MED ORDER — AZITHROMYCIN 250 MG PO TABS: ORAL_TABLET | ORAL | 0 refills | Status: DC

## 2024-10-16 NOTE — Telephone Encounter (Signed)
 Sent in antibiotics to pts local pharmacy.

## 2024-10-16 NOTE — Addendum Note (Signed)
 Addended by: AVELINA NO E on: 10/16/2024 09:01 AM   Modules accepted: Orders

## 2024-10-16 NOTE — Telephone Encounter (Signed)
 Consuelo notified by telephone that antibiotics were sent to [facility] on [address]

## 2024-10-18 ENCOUNTER — Ambulatory Visit: Payer: Self-pay

## 2024-10-18 ENCOUNTER — Telehealth: Payer: Self-pay | Admitting: *Deleted

## 2024-10-18 ENCOUNTER — Other Ambulatory Visit: Payer: Self-pay | Admitting: Family Medicine

## 2024-10-18 MED ORDER — CEFDINIR 300 MG PO CAPS: 600.0000 mg | ORAL_CAPSULE | Freq: Every day | ORAL | 0 refills | Status: DC

## 2024-10-18 NOTE — Telephone Encounter (Signed)
 Please see MyChart message patient sent in regards to this.

## 2024-10-18 NOTE — Telephone Encounter (Signed)
 Copied from CRM #[id number]. Topic: Clinical - Medical Advice >> Oct 18, 2024 12:54 PM Rea ORN wrote: Reason for CRM: Pt is requesting Arland to call her back. She stated she is having an ear issue and the rx that she was given is not working.  Please call back [phone]

## 2024-10-18 NOTE — Telephone Encounter (Signed)
 FYI Only or Action Required?: Action required by provider: medication refill request. Pt is requesting a stronger  antibiotic.   Patient was last seen in primary care on 10/12/2024 by Avelina Greig BRAVO, MD.  Called Nurse Triage reporting Otalgia and Cough. Pt states ears are hurting and she cannot hear  Symptoms began several days ago.  Interventions attempted: Other: seen in office and at [facility].  Symptoms are: gradually worsening.  Triage Disposition: See Physician Within 24 Hours  Patient/caregiver understands and will follow disposition?: No, refuses disposition                   Copied from CRM #[id number]. Topic: Clinical - Red Word Triage >> Oct 18, 2024  4:14 PM Viola FALCON wrote: Red Word that prompted transfer to Nurse Triage: Patient ears aren't any better. Both ears hurt/painful and still can't hear out of them. Reason for Disposition  [1] Taking antibiotic > 72 hours (3 days) and [2] pain persists or recurs  Answer Assessment - Initial Assessment Questions 1. ANTIBIOTIC: What antibiotic are you taking? How many times per day?     10/16/2024 2. ONSET: When was the antibiotic started?     Azithromycin  3. LOCATION: Which ear is involved?     both 4. PAIN: How bad is the pain?   (Scale 0-10; none, mild, moderate or severe)     Moderate -  5. FEVER: Do you have a fever? If Yes, ask: What is your temperature, how was it measured, and when did it start?     no 6. DISCHARGE: Is there any discharge? If Yes, ask: What color is it? (e.g., clear, white; yellow, green; bloody)     no 7. OTHER SYMPTOMS: Do you have any other symptoms? (e.g., headache, stiff neck, dizziness, vomiting, runny nose)     Wet cough - bringing up green phlegm, Sinus drainage - green  Protocols used: Ear - Otitis Media Follow-up Call-[facility]

## 2024-10-19 ENCOUNTER — Other Ambulatory Visit: Payer: Self-pay | Admitting: Family Medicine

## 2024-10-19 MED ORDER — PREDNISONE 20 MG PO TABS: ORAL_TABLET | ORAL | 0 refills | Status: DC

## 2024-10-19 MED ORDER — GUAIFENESIN-CODEINE 100-10 MG/5ML PO SOLN: 5.0000 mL | Freq: Every evening | ORAL | 0 refills | Status: DC | PRN

## 2024-10-19 NOTE — Telephone Encounter (Signed)
 MyChart message sent to patient with below information from Dr. Avelina.

## 2024-10-20 ENCOUNTER — Other Ambulatory Visit: Payer: Self-pay | Admitting: Family Medicine

## 2024-10-22 NOTE — Telephone Encounter (Signed)
 Called and schedule pt for Cpe / labs

## 2024-10-22 NOTE — Telephone Encounter (Signed)
 Please schedule CPE with fasting labs prior for Dr. Ermalene Searing.

## 2024-10-29 DIAGNOSIS — H6523 Chronic serous otitis media, bilateral: Secondary | ICD-10-CM | POA: Diagnosis not present

## 2024-10-29 DIAGNOSIS — H906 Mixed conductive and sensorineural hearing loss, bilateral: Secondary | ICD-10-CM | POA: Diagnosis not present

## 2024-11-03 ENCOUNTER — Other Ambulatory Visit: Payer: Self-pay | Admitting: Family Medicine

## 2024-11-15 ENCOUNTER — Telehealth: Payer: Self-pay | Admitting: *Deleted

## 2024-11-15 DIAGNOSIS — E7800 Pure hypercholesterolemia, unspecified: Secondary | ICD-10-CM

## 2024-11-15 NOTE — Telephone Encounter (Signed)
-----   Message from Veva JINNY Ferrari sent at 11/15/2024  3:35 PM EST ----- Regarding: Lab orders for [facility], 1.22.26 Patient is scheduled for CPX labs, please order future labs, Thanks , Veva

## 2024-11-29 ENCOUNTER — Other Ambulatory Visit (INDEPENDENT_AMBULATORY_CARE_PROVIDER_SITE_OTHER)

## 2024-11-29 ENCOUNTER — Ambulatory Visit: Payer: Self-pay | Admitting: Family Medicine

## 2024-11-29 ENCOUNTER — Encounter: Admitting: Family Medicine

## 2024-11-29 DIAGNOSIS — D509 Iron deficiency anemia, unspecified: Secondary | ICD-10-CM | POA: Diagnosis not present

## 2024-11-29 DIAGNOSIS — E7800 Pure hypercholesterolemia, unspecified: Secondary | ICD-10-CM

## 2024-11-29 LAB — CBC WITH DIFFERENTIAL/PLATELET
Basophils Absolute: 0 K/uL (ref 0.0–0.1)
Basophils Relative: 0.3 % (ref 0.0–3.0)
Eosinophils Absolute: 0 K/uL (ref 0.0–0.7)
Eosinophils Relative: 0.1 % (ref 0.0–5.0)
HCT: 39.2 % (ref 36.0–46.0)
Hemoglobin: 13.1 g/dL (ref 12.0–15.0)
Lymphocytes Relative: 34.9 % (ref 12.0–46.0)
Lymphs Abs: 2.5 K/uL (ref 0.7–4.0)
MCHC: 33.5 g/dL (ref 30.0–36.0)
MCV: 95 fl (ref 78.0–100.0)
Monocytes Absolute: 0.5 K/uL (ref 0.1–1.0)
Monocytes Relative: 7.6 % (ref 3.0–12.0)
Neutro Abs: 4.1 K/uL (ref 1.4–7.7)
Neutrophils Relative %: 57.1 % (ref 43.0–77.0)
Platelets: 274 K/uL (ref 150.0–400.0)
RBC: 4.12 Mil/uL (ref 3.87–5.11)
RDW: 14 % (ref 11.5–15.5)
WBC: 7.2 K/uL (ref 4.0–10.5)

## 2024-11-29 LAB — COMPREHENSIVE METABOLIC PANEL WITH GFR
ALT: 65 U/L — ABNORMAL HIGH (ref 3–35)
AST: 69 U/L — ABNORMAL HIGH (ref 5–37)
Albumin: 4.4 g/dL (ref 3.5–5.2)
Alkaline Phosphatase: 60 U/L (ref 39–117)
BUN: 9 mg/dL (ref 6–23)
CO2: 27 meq/L (ref 19–32)
Calcium: 9.5 mg/dL (ref 8.4–10.5)
Chloride: 101 meq/L (ref 96–112)
Creatinine, Ser: 0.69 mg/dL (ref 0.40–1.20)
GFR: 91.86 mL/min
Glucose, Bld: 82 mg/dL (ref 70–99)
Potassium: 3.8 meq/L (ref 3.5–5.1)
Sodium: 134 meq/L — ABNORMAL LOW (ref 135–145)
Total Bilirubin: 0.4 mg/dL (ref 0.2–1.2)
Total Protein: 7 g/dL (ref 6.0–8.3)

## 2024-11-29 LAB — IBC + FERRITIN
Ferritin: 21.3 ng/mL (ref 10.0–291.0)
Iron: 62 ug/dL (ref 42–145)
Saturation Ratios: 14.7 % — ABNORMAL LOW (ref 20.0–50.0)
TIBC: 421.4 ug/dL (ref 250.0–450.0)
Transferrin: 301 mg/dL (ref 212.0–360.0)

## 2024-11-29 LAB — LIPID PANEL
Cholesterol: 169 mg/dL (ref 28–200)
HDL: 122.5 mg/dL
LDL Cholesterol: 36 mg/dL (ref 10–99)
NonHDL: 46.09
Total CHOL/HDL Ratio: 1
Triglycerides: 48 mg/dL (ref 10.0–149.0)
VLDL: 9.6 mg/dL (ref 0.0–40.0)

## 2024-11-30 ENCOUNTER — Other Ambulatory Visit: Payer: Self-pay | Admitting: Family Medicine

## 2024-12-04 ENCOUNTER — Ambulatory Visit: Payer: Self-pay | Admitting: Gastroenterology

## 2024-12-04 ENCOUNTER — Ambulatory Visit: Admitting: Family Medicine

## 2024-12-06 ENCOUNTER — Encounter: Payer: Self-pay | Admitting: Family Medicine

## 2024-12-06 ENCOUNTER — Ambulatory Visit: Payer: Self-pay | Admitting: Family Medicine

## 2024-12-06 ENCOUNTER — Ambulatory Visit: Admitting: Family Medicine

## 2024-12-06 VITALS — BP 110/60 | HR 78 | Temp 97.8°F | Ht 64.25 in | Wt 153.1 lb

## 2024-12-06 DIAGNOSIS — J4530 Mild persistent asthma, uncomplicated: Secondary | ICD-10-CM | POA: Diagnosis not present

## 2024-12-06 DIAGNOSIS — K2950 Unspecified chronic gastritis without bleeding: Secondary | ICD-10-CM | POA: Diagnosis not present

## 2024-12-06 DIAGNOSIS — E663 Overweight: Secondary | ICD-10-CM

## 2024-12-06 DIAGNOSIS — E7800 Pure hypercholesterolemia, unspecified: Secondary | ICD-10-CM | POA: Diagnosis not present

## 2024-12-06 DIAGNOSIS — D500 Iron deficiency anemia secondary to blood loss (chronic): Secondary | ICD-10-CM | POA: Diagnosis not present

## 2024-12-06 DIAGNOSIS — Z6826 Body mass index (BMI) 26.0-26.9, adult: Secondary | ICD-10-CM | POA: Diagnosis not present

## 2024-12-06 DIAGNOSIS — G4733 Obstructive sleep apnea (adult) (pediatric): Secondary | ICD-10-CM

## 2024-12-06 DIAGNOSIS — I4891 Unspecified atrial fibrillation: Secondary | ICD-10-CM

## 2024-12-06 DIAGNOSIS — I10 Essential (primary) hypertension: Secondary | ICD-10-CM

## 2024-12-06 DIAGNOSIS — R7989 Other specified abnormal findings of blood chemistry: Secondary | ICD-10-CM | POA: Diagnosis not present

## 2024-12-06 DIAGNOSIS — Z0000 Encounter for general adult medical examination without abnormal findings: Secondary | ICD-10-CM

## 2024-12-06 DIAGNOSIS — Z23 Encounter for immunization: Secondary | ICD-10-CM

## 2024-12-06 DIAGNOSIS — E042 Nontoxic multinodular goiter: Secondary | ICD-10-CM | POA: Diagnosis not present

## 2024-12-06 LAB — HEPATIC FUNCTION PANEL
ALT: 31 U/L (ref 3–35)
AST: 22 U/L (ref 5–37)
Albumin: 4.4 g/dL (ref 3.5–5.2)
Alkaline Phosphatase: 65 U/L (ref 39–117)
Bilirubin, Direct: 0.1 mg/dL (ref 0.1–0.3)
Total Bilirubin: 0.4 mg/dL (ref 0.2–1.2)
Total Protein: 7 g/dL (ref 6.0–8.3)

## 2024-12-06 NOTE — Progress Notes (Signed)
 "   Patient ID: Amanda Estrada, female    DOB: [birth year], 64 y.o.   MRN: [id number]  This visit was conducted in person.  BP 110/60   Pulse 78   Temp 97.8 F (36.6 C) (Temporal)   Ht 5' 4.25 (1.632 m)   Wt 153 lb 2 oz (69.5 kg)   SpO2 98%   BMI 26.08 kg/m    CC:  Chief Complaint  Patient presents with   Annual Exam    Subjective:   HPI: Amanda Estrada is a 64 y.o. female presenting on 12/06/2024 for Annual Exam  The patient presents for  complete physical and review of chronic health problems. He/She also has the following acute concerns today:   Recent labs showed elevated LFTs.. was on valacyclovir ( was on for 5 days, off now)  No tylenol , minimal ETOH.  History of anemia, resolved but ferritin low normal with a low saturation, so  Dr. [facility]  recommended she should get back on an iron supplement for couple of months.   EGD (showed chronic gastritis, possible source of bleeding) and colonoscopy in 2025  Atrial fibrillation with RVR: Rate controlled with Cardizem .  On Eliquis  anticoagulation S/P triple bypass in 06/2022.. found during ablation.   She has been losing weight on Zepbound  15 mg weekly .. has lost 30 lbs. Body mass index is 26.08 kg/m. Wt Readings from Last 3 Encounters:  12/06/24 153 lb 2 oz (69.5 kg)  10/12/24 153 lb 4 oz (69.5 kg)  06/26/24 148 lb 2 oz (67.2 kg)    Elevated Cholesterol: LDL at goal on rosuvastatin  40 mg daily Lab Results  Component Value Date   CHOL 169 11/29/2024   HDL 122.50 11/29/2024   LDLCALC 36 11/29/2024   LDLDIRECT 81.0 10/16/2010   TRIG 48.0 11/29/2024   CHOLHDL 1 11/29/2024  Using medications without problems: Muscle aches:  Diet compliance: good Exercise:  doing shoulder rehab, walking frequently. Other complaints:  Hypertension:  Well-controlled on losartan  50 mg daily, Cardizem  CD 240 mg daily, clonidine  0.1 mg tablet daily and bisoprolol  10 mg p.o. daily BP Readings from Last 3 Encounters:  12/06/24  110/60  10/12/24 138/70  08/18/24 (!) 140/80  Using medication without problems or lightheadedness:  occ Chest pain with exertion: none Edema:none Short of breath: occ Average home BPs: Other issues:  Obstructive sleep apnea: not using CPAP.Amanda Estrada not snoring after weight loss.   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 now on omeprazole  only once daily.. per Danis.  Relevant past medical, surgical, family and social history reviewed and updated as indicated. Interim medical history since our last visit reviewed. Allergies and medications reviewed and updated. Outpatient Medications Prior to Visit  Medication Sig Dispense Refill   albuterol  (VENTOLIN  HFA) 108 (90 Base) MCG/ACT inhaler Inhale 2 puffs into the lungs every 4 (four) hours as needed for wheezing or shortness of breath. 8 g 2   alendronate  (FOSAMAX ) 70 MG tablet Take 1 tablet (70 mg total) by mouth every 7 (seven) days. Take with a full glass of water on an empty stomach. 4 tablet 11   apixaban  (ELIQUIS ) 5 MG TABS tablet Take 1 tablet (5 mg total) by mouth 2 (two) times daily. 60 tablet 11   bisoprolol  (ZEBETA ) 10 MG tablet Take 1 tablet (10 mg total) by mouth daily. 90 tablet 3   cloNIDine  (CATAPRES ) 0.1 MG tablet  TAKE 1 TABLET BY MOUTH EVERYDAY AT BEDTIME 90 tablet 1   diltiazem  (CARDIZEM  CD) 240 MG 24 hr capsule TAKE 1 CAPSULE BY MOUTH EVERY DAY 90 capsule 3   fluticasone  (FLONASE ) 50 MCG/ACT nasal spray PLACE 1 SPRAY INTO BOTH NOSTRILS DAILY AS NEEDED FOR ALLERGIES. 48 mL 1   levocetirizine (XYZAL) 5 MG tablet Take 5 mg by mouth at bedtime.      losartan  (COZAAR ) 100 MG tablet Take 1 tablet (100 mg total) by mouth daily. 90 tablet 1   montelukast  (SINGULAIR ) 10 MG tablet TAKE 1 TABLET BY MOUTH EVERYDAY AT BEDTIME 90 tablet 3   omeprazole  (PRILOSEC) 40 MG capsule TAKE 1 CAPSULE BY MOUTH TWICE A DAY 180 capsule 0    rosuvastatin  (CRESTOR ) 40 MG tablet Take 40 mg by mouth daily.     tirzepatide  (ZEPBOUND ) 15 MG/0.5ML Pen INJECT 15 MG INTO THE SKIN ONCE A WEEK. 6 mL 0   azithromycin  (ZITHROMAX ) 250 MG tablet 2 tab po x 1 day then 1 tab po daily 6 tablet 0   cefdinir  (OMNICEF ) 300 MG capsule Take 2 capsules (600 mg total) by mouth daily. 20 capsule 0   guaiFENesin -codeine  100-10 MG/5ML syrup Take 5 mLs by mouth at bedtime as needed for cough. 118 mL 0   predniSONE  (DELTASONE ) 20 MG tablet 3 tabs by mouth daily x 3 days, then 2 tabs by mouth daily x 2 days then 1 tab by mouth daily x 2 days 15 tablet 0   No facility-administered medications prior to visit.     Per HPI unless specifically indicated in ROS section below Review of Systems  Constitutional:  Negative for fatigue and fever.  HENT:  Negative for congestion.   Eyes:  Negative for pain.  Respiratory:  Negative for cough and shortness of breath.   Cardiovascular:  Negative for chest pain, palpitations and leg swelling.  Gastrointestinal:  Negative for abdominal pain.  Genitourinary:  Negative for dysuria and vaginal bleeding.  Musculoskeletal:  Negative for back pain.  Neurological:  Negative for syncope, light-headedness and headaches.  Psychiatric/Behavioral:  Negative for dysphoric mood.    Objective:  BP 110/60   Pulse 78   Temp 97.8 F (36.6 C) (Temporal)   Ht 5' 4.25 (1.632 m)   Wt 153 lb 2 oz (69.5 kg)   SpO2 98%   BMI 26.08 kg/m   Wt Readings from Last 3 Encounters:  12/06/24 153 lb 2 oz (69.5 kg)  10/12/24 153 lb 4 oz (69.5 kg)  06/26/24 148 lb 2 oz (67.2 kg)      Physical Exam Constitutional:      General: She is not in acute distress.    Appearance: Normal appearance. She is well-developed. She is not ill-appearing or toxic-appearing.  HENT:     Head: Normocephalic.     Right Ear: Hearing, tympanic membrane, ear canal and external ear normal. Tympanic membrane is not erythematous, retracted or bulging.     Left Ear:  Hearing, tympanic membrane, ear canal and external ear normal. Tympanic membrane is not erythematous, retracted or bulging.     Nose: No mucosal edema or rhinorrhea.     Right Sinus: No maxillary sinus tenderness or frontal sinus tenderness.     Left Sinus: No maxillary sinus tenderness or frontal sinus tenderness.     Mouth/Throat:     Pharynx: Uvula midline.  Eyes:     General: Lids are normal. Lids are everted, no foreign bodies appreciated.     Conjunctiva/sclera:  Conjunctivae normal.     Pupils: Pupils are equal, round, and reactive to light.  Neck:     Thyroid : No thyroid  mass or thyromegaly.     Vascular: No carotid bruit.     Trachea: Trachea normal.  Cardiovascular:     Rate and Rhythm: Normal rate and regular rhythm.     Pulses: Normal pulses.     Heart sounds: Normal heart sounds, S1 normal and S2 normal. No murmur heard.    No friction rub. No gallop.  Pulmonary:     Effort: Pulmonary effort is normal. No tachypnea or respiratory distress.     Breath sounds: Normal breath sounds. No decreased breath sounds, wheezing, rhonchi or rales.  Abdominal:     General: Bowel sounds are normal.     Palpations: Abdomen is soft.     Tenderness: There is no abdominal tenderness.  Musculoskeletal:     Cervical back: Normal range of motion and neck supple.  Skin:    General: Skin is warm and dry.     Findings: No rash.  Neurological:     Mental Status: She is alert.  Psychiatric:        Mood and Affect: Mood is not anxious or depressed.        Speech: Speech normal.        Behavior: Behavior normal. Behavior is cooperative.        Thought Content: Thought content normal.        Judgment: Judgment normal.       Results for orders placed or performed in visit on 11/29/24  Comprehensive metabolic panel   Collection Time: 11/29/24  9:08 AM  Result Value Ref Range   Sodium 134 (L) 135 - 145 mEq/L   Potassium 3.8 3.5 - 5.1 mEq/L   Chloride 101 96 - 112 mEq/L   CO2 27 19 - 32  mEq/L   Glucose, Bld 82 70 - 99 mg/dL   BUN 9 6 - 23 mg/dL   Creatinine, Ser 9.30 0.40 - 1.20 mg/dL   Total Bilirubin 0.4 0.2 - 1.2 mg/dL   Alkaline Phosphatase 60 39 - 117 U/L   AST 69 (H) 5 - 37 U/L   ALT 65 (H) 3 - 35 U/L   Total Protein 7.0 6.0 - 8.3 g/dL   Albumin 4.4 3.5 - 5.2 g/dL   GFR 08.13 >39.99 mL/min   Calcium  9.5 8.4 - 10.5 mg/dL  Lipid panel   Collection Time: 11/29/24  9:08 AM  Result Value Ref Range   Cholesterol 169 28 - 200 mg/dL   Triglycerides 51.9 89.9 - 149.0 mg/dL   HDL 877.49 >60.99 mg/dL   VLDL 9.6 0.0 - 59.9 mg/dL   LDL Cholesterol 36 10 - 99 mg/dL   Total CHOL/HDL Ratio 1    NonHDL 46.09   IBC + Ferritin   Collection Time: 11/29/24  9:08 AM  Result Value Ref Range   Iron 62 42 - 145 ug/dL   Transferrin 698.9 787.9 - 360.0 mg/dL   Saturation Ratios 85.2 (L) 20.0 - 50.0 %   Ferritin 21.3 10.0 - 291.0 ng/mL   TIBC 421.4 250.0 - 450.0 mcg/dL  CBC w/Diff   Collection Time: 11/29/24  9:08 AM  Result Value Ref Range   WBC 7.2 4.0 - 10.5 K/uL   RBC 4.12 3.87 - 5.11 Mil/uL   Hemoglobin 13.1 12.0 - 15.0 g/dL   HCT 60.7 63.9 - 53.9 %   MCV 95.0 78.0 - 100.0 fl  MCHC 33.5 30.0 - 36.0 g/dL   RDW 85.9 88.4 - 84.4 %   Platelets 274.0 150.0 - 400.0 K/uL   Neutrophils Relative % 57.1 43.0 - 77.0 %   Lymphocytes Relative 34.9 12.0 - 46.0 %   Monocytes Relative 7.6 3.0 - 12.0 %   Eosinophils Relative 0.1 0.0 - 5.0 %   Basophils Relative 0.3 0.0 - 3.0 %   Neutro Abs 4.1 1.4 - 7.7 K/uL   Lymphs Abs 2.5 0.7 - 4.0 K/uL   Monocytes Absolute 0.5 0.1 - 1.0 K/uL   Eosinophils Absolute 0.0 0.0 - 0.7 K/uL   Basophils Absolute 0.0 0.0 - 0.1 K/uL     COVID 19 screen:  No recent travel or known exposure to COVID19 The patient denies respiratory symptoms of COVID 19 at this time. The importance of social distancing was discussed today.   Assessment and Plan   The patient's preventative maintenance and recommended screening tests for an annual wellness exam were  reviewed in full today. Brought up to date unless services declined.  Counselled on the importance of diet, exercise, and its role in overall health and mortality. The patient's FH and SH was reviewed, including their home life, tobacco status, and drug and alcohol status.   Vaccines: Uptodate tdap,  COVID x 2,   consider shingrix and pneumonia vaccine,  refused flu Pap/DVE:  06/2021, nml pap, neg HPV, repeat  In 5 years. No family history of ovarian and uterine cancer Mammo:  05/2023. Plan repeat.  Bone Density:10/2023 on fosamax  ( 2025).Amanda Estrada osteopenia T-1.9 Colon:  05/2024 , Dr. JONETTA dotter s, plan q 5 year, family history of colon ca in Mom and Dad.  Smoking Status: nonsmoker ETOH/ drug use: wine 1 glass daily/none  Hep C:  done  HIV screen:   Refused.  Problem List Items Addressed This Visit     Atrial fibrillation with RVR (HCC)   Chronic,  Followed by cardiology S/P ablation Rate controlled Cardizem .  On Eliquis  anticoagulation      Chronic gastritis   Resolved on PPI.      Elevated LFTs    Acute.. no liver enlargement.. likely due to  valacyclovir in setting of high dose statin... Now off valacyclovir.. recheck.  Minimal ETOH and no tylenol  use.      Relevant Orders   Hepatic function panel   Essential hypertension   Stable, chronic.  Continue current medication.  Well-controlled on losartan  50 mg daily, Cardizem  CD 240 mg daily, clonidine  0.1 mg tablet daily and bisoprolol  10 mg p.o. daily      Iron deficiency anemia due to chronic blood loss   Acute,  improved but not on iron supplement.   Restart ferrous sulfate 325 mg p.o. dail for a few motnhs.  Colonoscopy unremarkable.  Upper endoscopy showed chronic gastritis, possible source of anemia.      Mild persistent asthma   No ongoing asthma exacerbation.  Patient has albuterol  to use as needed.      Multiple thyroid  nodules   2025 US : Incidental note is made of multiple small subcentimeter thyroid  cysts and  nodules.   No individual nodule meets criteria to recommend biopsy or imaging surveillance. No further follow-up imaging is recommended.  Nml LFTs.      Obstructive sleep apnea    No longer snoring after weight loss.. not using CPAP.      Overweight with body mass index (BMI) of 26 to 26.9 in adult    Successful weight loss with  tirzepatide  15 mg weekly.      Pure hypercholesterolemia   Stable, chronic.  Continue current medication.  Rosuvastatin  40 mg daily      Other Visit Diagnoses       Routine general medical examination at a health care facility    -  Primary        No orders of the defined types were placed in this encounter.    Greig Ring, MD   "

## 2024-12-06 NOTE — Addendum Note (Signed)
 Addended by: WENDELL ARLAND RAMAN on: 12/06/2024 10:35 AM   Modules accepted: Orders

## 2024-12-06 NOTE — Assessment & Plan Note (Signed)
 2025 US : Incidental note is made of multiple small subcentimeter thyroid  cysts and nodules.   No individual nodule meets criteria to recommend biopsy or imaging surveillance. No further follow-up imaging is recommended.  Nml LFTs.

## 2024-12-06 NOTE — Assessment & Plan Note (Signed)
Stable, chronic.  Continue current medication.  Well-controlled on losartan 50 mg daily, Cardizem CD 240 mg daily, clonidine 0.1 mg tablet daily and bisoprolol 10 mg p.o. daily

## 2024-12-06 NOTE — Assessment & Plan Note (Signed)
"   Acute.. no liver enlargement.. likely due to  valacyclovir in setting of high dose statin... Now off valacyclovir.. recheck.  Minimal ETOH and no tylenol  use. "

## 2024-12-06 NOTE — Assessment & Plan Note (Signed)
"   No longer snoring after weight loss.. not using CPAP. "

## 2024-12-06 NOTE — Assessment & Plan Note (Signed)
 Chronic,  Followed by cardiology S/P ablation Rate controlled Cardizem .  On Eliquis  anticoagulation

## 2024-12-06 NOTE — Assessment & Plan Note (Addendum)
 Acute,  improved but not on iron supplement.   Restart ferrous sulfate 325 mg p.o. dail for a few motnhs.  Colonoscopy unremarkable.  Upper endoscopy showed chronic gastritis, possible source of anemia.

## 2024-12-06 NOTE — Assessment & Plan Note (Signed)
"   Successful weight loss with tirzepatide  15 mg weekly. "

## 2024-12-06 NOTE — Patient Instructions (Addendum)
"   Schedule mammogram and bone density in next year. "

## 2024-12-06 NOTE — Assessment & Plan Note (Signed)
Stable, chronic.  Continue current medication.  Rosuvastatin 40 mg daily

## 2024-12-06 NOTE — Assessment & Plan Note (Signed)
Resolved on PPI 

## 2024-12-06 NOTE — Assessment & Plan Note (Signed)
 No ongoing asthma exacerbation.  Patient has albuterol  to use as needed.
